# Patient Record
Sex: Female | Born: 1960
Health system: Southern US, Community
[De-identification: ages and names within clinical notes are randomized; demographics above are authoritative.]

## PROBLEM LIST (undated history)

## (undated) DIAGNOSIS — J302 Other seasonal allergic rhinitis: Secondary | ICD-10-CM

## (undated) DIAGNOSIS — I8393 Asymptomatic varicose veins of bilateral lower extremities: Secondary | ICD-10-CM

## (undated) DIAGNOSIS — K769 Liver disease, unspecified: Secondary | ICD-10-CM

## (undated) DIAGNOSIS — G5600 Carpal tunnel syndrome, unspecified upper limb: Secondary | ICD-10-CM

## (undated) DIAGNOSIS — E119 Type 2 diabetes mellitus without complications: Secondary | ICD-10-CM

## (undated) DIAGNOSIS — R569 Unspecified convulsions: Secondary | ICD-10-CM

## (undated) DIAGNOSIS — E876 Hypokalemia: Secondary | ICD-10-CM

## (undated) DIAGNOSIS — J449 Chronic obstructive pulmonary disease, unspecified: Secondary | ICD-10-CM

## (undated) DIAGNOSIS — R300 Dysuria: Secondary | ICD-10-CM

## (undated) DIAGNOSIS — G5603 Carpal tunnel syndrome, bilateral upper limbs: Secondary | ICD-10-CM

## (undated) DIAGNOSIS — I2510 Atherosclerotic heart disease of native coronary artery without angina pectoris: Secondary | ICD-10-CM

## (undated) DIAGNOSIS — M069 Rheumatoid arthritis, unspecified: Secondary | ICD-10-CM

## (undated) DIAGNOSIS — C3412 Malignant neoplasm of upper lobe, left bronchus or lung: Secondary | ICD-10-CM

## (undated) DIAGNOSIS — B1920 Unspecified viral hepatitis C without hepatic coma: Secondary | ICD-10-CM

## (undated) DIAGNOSIS — I10 Essential (primary) hypertension: Secondary | ICD-10-CM

## (undated) DIAGNOSIS — M1990 Unspecified osteoarthritis, unspecified site: Secondary | ICD-10-CM

## (undated) DIAGNOSIS — E782 Mixed hyperlipidemia: Secondary | ICD-10-CM

## (undated) DIAGNOSIS — F32A Depression, unspecified: Secondary | ICD-10-CM

## (undated) HISTORY — DX: Chronic obstructive pulmonary disease, unspecified: J44.9

## (undated) HISTORY — DX: Atherosclerotic heart disease of native coronary artery without angina pectoris: I25.10

## (undated) HISTORY — DX: Depression, unspecified: F32.A

## (undated) HISTORY — DX: Unspecified viral hepatitis C without hepatic coma: B19.20

## (undated) HISTORY — DX: Liver disease, unspecified: K76.9

## (undated) HISTORY — DX: Mixed hyperlipidemia: E78.2

## (undated) HISTORY — DX: Rheumatoid arthritis, unspecified: M06.9

## (undated) HISTORY — PX: TONSILLECTOMY: SUR1361

## (undated) HISTORY — DX: Carpal tunnel syndrome, unspecified upper limb: G56.00

## (undated) HISTORY — DX: Type 2 diabetes mellitus without complications: E11.9

## (undated) HISTORY — DX: Dysuria: R30.0

## (undated) HISTORY — DX: Essential (primary) hypertension: I10

## (undated) HISTORY — DX: Unspecified osteoarthritis, unspecified site: M19.90

## (undated) HISTORY — DX: Carpal tunnel syndrome, bilateral upper limbs: G56.03

## (undated) HISTORY — DX: Unspecified convulsions: R56.9

## (undated) HISTORY — DX: Asymptomatic varicose veins of bilateral lower extremities: I83.93

## (undated) HISTORY — DX: Hypokalemia: E87.6

## (undated) HISTORY — PX: BACK SURGERY: SHX140

## (undated) HISTORY — PX: TUBAL LIGATION: SHX77

## (undated) HISTORY — DX: Other seasonal allergic rhinitis: J30.2

---

## 2003-01-07 ENCOUNTER — Ambulatory Visit (HOSPITAL_COMMUNITY): Admission: RE | Admit: 2003-01-07 | Discharge: 2003-01-07 | Payer: Self-pay

## 2004-11-08 ENCOUNTER — Ambulatory Visit: Payer: Self-pay | Admitting: Cardiology

## 2010-09-24 ENCOUNTER — Encounter: Payer: Self-pay | Admitting: Family Medicine

## 2010-12-20 DIAGNOSIS — I10 Essential (primary) hypertension: Secondary | ICD-10-CM | POA: Insufficient documentation

## 2010-12-20 DIAGNOSIS — E876 Hypokalemia: Secondary | ICD-10-CM | POA: Insufficient documentation

## 2011-06-11 DIAGNOSIS — M1990 Unspecified osteoarthritis, unspecified site: Secondary | ICD-10-CM | POA: Insufficient documentation

## 2012-06-16 ENCOUNTER — Other Ambulatory Visit: Payer: Self-pay | Admitting: Foot & Ankle Surgery

## 2012-06-16 DIAGNOSIS — R0989 Other specified symptoms and signs involving the circulatory and respiratory systems: Secondary | ICD-10-CM

## 2012-06-18 ENCOUNTER — Other Ambulatory Visit: Payer: Self-pay

## 2012-12-10 DIAGNOSIS — R079 Chest pain, unspecified: Secondary | ICD-10-CM

## 2013-09-22 DIAGNOSIS — R609 Edema, unspecified: Secondary | ICD-10-CM | POA: Insufficient documentation

## 2014-02-23 DIAGNOSIS — F17200 Nicotine dependence, unspecified, uncomplicated: Secondary | ICD-10-CM | POA: Insufficient documentation

## 2015-05-17 ENCOUNTER — Encounter: Payer: Self-pay | Admitting: Neurology

## 2015-05-17 ENCOUNTER — Ambulatory Visit (INDEPENDENT_AMBULATORY_CARE_PROVIDER_SITE_OTHER): Payer: Commercial Managed Care - HMO | Admitting: Neurology

## 2015-05-17 VITALS — BP 115/60 | HR 78 | Ht 66.0 in | Wt 188.0 lb

## 2015-05-17 DIAGNOSIS — R55 Syncope and collapse: Secondary | ICD-10-CM | POA: Diagnosis not present

## 2015-05-17 DIAGNOSIS — R569 Unspecified convulsions: Secondary | ICD-10-CM | POA: Insufficient documentation

## 2015-05-17 NOTE — Progress Notes (Signed)
[facility] [address] [place]. Christina Jenkins [id number] [phone]       OFFICE CONSULT NOTE  [name] Date of Birth:  [birth year] Medical Record Number:  [id number]   Referring MD:  Bennett Scrape, MD Reason for Referral:  seizure  HPI: [name] is a 54 year old pleasant Caucasian lady who had an witness episode on 05/05/15 of brief loss of consciousness. Patient works as a Surveyor, mining and hence stopped and lasting she remembers is waving O2 lady who is a mother of one of the kids she drives. The patient does not remember what happened for the next few minutes but the next thing she remembers is a lady who is holding her and patient had collapsed into her seat. She did not have any headache, tongue bite, incontinence or injuries. The eye witness lady  apparently described some clonic movements to the EMS. Patient was taken to the emergency room at [facility] where basic lab work and CT scan of the head which I have reviewed was unremarkable. Patient quickly returned back to her baseline though she did feel tired for a while. She was not started on any seizure medications. Patient states she's done well since then has not had any recurrent episodes of passing out. She denies specifically any triggers, chest pain, shortness of breath, palpitations, sweating prior to this episode. She has had no prior episodes of syncope or presyncopal events or seizures. There is no history of significant head injury with loss of consciousness, strokes or seizures. There is no family history of epilepsy either. Patient was on Wellbutrin for the last 1 year prior and was trying to quit smoking. She has stopped Wellbutrin since this episode.  ROS:   14 system review of systems is positive for leg swelling, excessive thirst, seizure, loss of consciousness and all other systems negative  PMH:  Past Medical History  Diagnosis Date  . Liver disease     hepatitis c  . Hypokalemia     . Dysuria   . Carpal tunnel syndrome on both sides   . Arthritis   . Seasonal allergies     Social History:  Social History   Social History  . Marital Status: Married    Spouse Name: N/A  . Number of Children: N/A  . Years of Education: N/A   Occupational History  . Not on file.   Social History Main Topics  . Smoking status: Current Every Day Smoker -- 1.00 packs/day  . Smokeless tobacco: Not on file  . Alcohol Use: No  . Drug Use: Not on file  . Sexual Activity: Not on file   Other Topics Concern  . Not on file   Social History Narrative  . No narrative on file    Medications:   No current outpatient prescriptions on file prior to visit.   No current facility-administered medications on file prior to visit.    Allergies:  No Known Allergies  Physical Exam General: well developed, well nourished pleasant middle-age Caucasian lady who is anxious., seated, in no evident distress Head: head normocephalic and atraumatic.   Neck: supple with no carotid or supraclavicular bruits Cardiovascular: regular rate and rhythm, no murmurs Musculoskeletal: no deformity Skin:  no rash/petichiae Vascular:  Normal pulses all extremities  Neurologic Exam Mental Status: Awake and fully alert. Oriented to place and time. Recent and remote memory intact. Attention span, concentration and fund of knowledge appropriate. Mood and affect appropriate.  Cranial Nerves: Fundoscopic exam  reveals sharp disc margins. Pupils equal, briskly reactive to light. Extraocular movements full without nystagmus. Visual fields full to confrontation. Hearing intact. Facial sensation intact. Face, tongue, palate moves normally and symmetrically.  Motor: Normal bulk and tone. Normal strength in all tested extremity muscles. Sensory.: intact to touch , pinprick , position and vibratory sensation.  Coordination: Rapid alternating movements normal in all extremities. Finger-to-nose and heel-to-shin performed  accurately bilaterally. Gait and Station: Arises from chair without difficulty. Stance is normal. Gait demonstrates normal stride length and balance . Able to heel, toe and tandem walk without difficulty.  Reflexes: 1+ and symmetric. Toes downgoing.       ASSESSMENT: 44 year Caucasian lady with single episode of brief loss of consciousness with weakness chronic activity possible convulsive syncopal syncopal event versus single unprovoked seizure. Non-focal neurological exam and brain imaging    PLAN: I had a long discussion with the patient with regards to her episode of brief loss of consciousness and jerking activity being possible convulsive syncope versus single unprovoked seizure. I personally reviewed her available imaging studies lab work and here visit notes I recommend further testing by checking echocardiogram, 72 hour Holter monitor, EEG and MRI scan of the brain. I explained to her that the risk of having subsequent unprovoked seizures is only 50% lifetime and hence routine anticonvulsants  are not indicated unless she has an abnormal EEG or MRI scan. 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 Greater than 50% of time during this 45 minute consultation was spent on counseling and coordination of care. She will return for follow-up in 2 months or call earlier if necessary.  Delia Heady, MD Note: This document was prepared with digital dictation and possible smart phrase technology. Any transcriptional errors that result from this process are unintentional.

## 2015-05-17 NOTE — Patient Instructions (Signed)
I had a long discussion with the patient with regards to her episode of brief loss of consciousness and jerking activity being possible convulsive syncope versus single unprovoked seizure. I personally reviewed her available imaging studies lab work and here visit notes I recommend further testing by checking echocardiogram, 72 hour Holter monitor, EEG and MRI scan of the brain. 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 She will return for follow-up in 2 months or call earlier if necessary. Syncope Syncope is a medical term for fainting or passing out. This means you lose consciousness and drop to the ground. People are generally unconscious for less than 5 minutes. You may have some muscle twitches for up to 15 seconds before waking up and returning to normal. Syncope occurs more often in older adults, but it can happen to anyone. While most causes of syncope are not dangerous, syncope can be a sign of a serious medical problem. It is important to seek medical care.  CAUSES  Syncope is caused by a sudden drop in blood flow to the brain. The specific cause is often not determined. Factors that can bring on syncope include:  Taking medicines that lower blood pressure.  Sudden changes in posture, such as standing up quickly.  Taking more medicine than prescribed.  Standing in one place for too long.  Seizure disorders.  Dehydration and excessive exposure to heat.  Low blood sugar (hypoglycemia).  Straining to have a bowel movement.  Heart disease, irregular heartbeat, or other circulatory problems.  Fear, emotional distress, seeing blood, or severe pain. SYMPTOMS  Right before fainting, you may:  Feel dizzy or light-headed.  Feel nauseous.  See all white or all black in your field of vision.  Have cold, clammy skin. DIAGNOSIS  Your health care provider will ask about your symptoms, perform a physical exam, and perform  an electrocardiogram (ECG) to record the electrical activity of your heart. Your health care provider may also perform other heart or blood tests to determine the cause of your syncope which may include:  Transthoracic echocardiogram (TTE). During echocardiography, sound waves are used to evaluate how blood flows through your heart.  Transesophageal echocardiogram (TEE).  Cardiac monitoring. This allows your health care provider to monitor your heart rate and rhythm in real time.  Holter monitor. This is a portable device that records your heartbeat and can help diagnose heart arrhythmias. It allows your health care provider to track your heart activity for several days, if needed.  Stress tests by exercise or by giving medicine that makes the heart beat faster. TREATMENT  In most cases, no treatment is needed. Depending on the cause of your syncope, your health care provider may recommend changing or stopping some of your medicines. HOME CARE INSTRUCTIONS  Have someone stay with you until you feel stable.  Do not drive, use machinery, or play sports until your health care provider says it is okay.  Keep all follow-up appointments as directed by your health care provider.  Lie down right away if you start feeling like you might faint. Breathe deeply and steadily. Wait until all the symptoms have passed.  Drink enough fluids to keep your urine clear or pale yellow.  If you are taking blood pressure or heart medicine, get up slowly and take several minutes to sit and then stand. This can reduce dizziness. SEEK IMMEDIATE MEDICAL CARE IF:   You have a severe  headache.  You have unusual pain in the chest, abdomen, or back.  You are bleeding from your mouth or rectum, or you have black or tarry stool.  You have an irregular or very fast heartbeat.  You have pain with breathing.  You have repeated fainting or seizure-like jerking during an episode.  You faint when sitting or lying  down.  You have confusion.  You have trouble walking.  You have severe weakness.  You have vision problems. If you fainted, call your local emergency services (911 in [place] Do not drive yourself to the hospital.  MAKE SURE YOU:  Understand these instructions.  Will watch your condition.  Will get help right away if you are not doing well or get worse. Document Released: 08/20/2005 Document Revised: 08/25/2013 Document Reviewed: 10/19/2011 [facility] Patient Information 2015 [place], [place]. This information is not intended to replace advice given to you by your health care provider. Make sure you discuss any questions you have with your health care provider.

## 2015-05-21 DIAGNOSIS — R55 Syncope and collapse: Secondary | ICD-10-CM | POA: Diagnosis not present

## 2015-05-23 ENCOUNTER — Ambulatory Visit (INDEPENDENT_AMBULATORY_CARE_PROVIDER_SITE_OTHER): Payer: Commercial Managed Care - HMO

## 2015-05-23 DIAGNOSIS — R55 Syncope and collapse: Secondary | ICD-10-CM

## 2015-05-25 ENCOUNTER — Other Ambulatory Visit: Payer: Self-pay

## 2015-05-25 ENCOUNTER — Telehealth: Payer: Self-pay | Admitting: Neurology

## 2015-05-25 ENCOUNTER — Ambulatory Visit (INDEPENDENT_AMBULATORY_CARE_PROVIDER_SITE_OTHER): Payer: Commercial Managed Care - HMO

## 2015-05-25 DIAGNOSIS — R55 Syncope and collapse: Secondary | ICD-10-CM | POA: Diagnosis not present

## 2015-05-25 NOTE — Telephone Encounter (Signed)
Rn call patient back about MRI results. Pt stated it was done at the hospital in [facility]. Rn stated a message will be sent to Dr.Sethi. Rn also told patient that  [facility] is not part of [facility]. Rn explain to patient that she will have to go the hospital where the MRI was and filled out a release form, and have them mail the cd and fax or send a copy of the mri report. Pt stated she will someone take her to get this done, because she is not allowed to drive.

## 2015-05-25 NOTE — Telephone Encounter (Signed)
Patient called to get results of recent MRI

## 2015-06-06 ENCOUNTER — Ambulatory Visit (INDEPENDENT_AMBULATORY_CARE_PROVIDER_SITE_OTHER): Payer: Commercial Managed Care - HMO | Admitting: Neurology

## 2015-06-06 DIAGNOSIS — R55 Syncope and collapse: Secondary | ICD-10-CM

## 2015-06-06 DIAGNOSIS — R569 Unspecified convulsions: Secondary | ICD-10-CM

## 2015-06-09 ENCOUNTER — Telehealth: Payer: Self-pay | Admitting: Neurology

## 2015-06-09 ENCOUNTER — Other Ambulatory Visit: Payer: Self-pay | Admitting: Neurology

## 2015-06-09 DIAGNOSIS — R569 Unspecified convulsions: Secondary | ICD-10-CM

## 2015-06-09 NOTE — Telephone Encounter (Signed)
Sent note to Dr. Terrace Arabia.

## 2015-06-09 NOTE — Telephone Encounter (Signed)
i called the patient back after Dr. Terrace Arabia and Dr. Marjory Lies  had both looked at the EEG which had shown some intermittent temporal rhytmic activity which was of questionable significance. I recommend a repeat prolonged 1 hour EEG to see if there is any epileptiform potential. 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 She was to call back if she had further episodes of loss of consciousness or seizure-like activity.

## 2015-06-09 NOTE — Telephone Encounter (Signed)
Rn talk to patient about her EEG results not being posted. Pt stated Dr. Pearlean Brownie did call her this afternoon and told her that the EEG was normal, but they got disconnected. Rn stated he call her back but she was not available. Rn stated a message will be forward to Dr. Roda Shutters.

## 2015-06-09 NOTE — Telephone Encounter (Signed)
Pt called requesting EEG results. Please call and advise. Patient can be reached at [phone]

## 2015-06-10 NOTE — Procedures (Signed)
   [facility]  EEG (ELECTROENCEPHALOGRAM) REPORT   STUDY DATE: 06/06/15 PATIENT NAME: Christina Jenkins DOB: 1961-04-01 MRN: [id number]  ORDERING CLINICIAN: Delia Heady, MD   TECHNOLOGIST: Gearldine Shown  TECHNIQUE: Electroencephalogram was recorded utilizing standard 10-20 system of lead placement and reformatted into average and bipolar montages.  RECORDING TIME: 24 minutes ACTIVATION: hyperventilation and photic stimulation  CLINICAL INFORMATION: 54 year old female with episode of transient altered consciousness. Evaluate for seizure activity.  FINDINGS: Background rhythms of 9-10 hertz and 50-60 microvolts. Intermittent sharp waves are noted over the left hemisphere (Fp2, F8, T4, 02), sometimes associated with slow wave depolarization. No electrographic seizures are seen. Patient recorded in the awake state. EKG channel shows irregular rhythm of 60-70 beats per minute.  IMPRESSION:  Abnormal EEG in the awake state demonstrating: 1. Intermittent left hemisphere sharp waves over FP2, F8, T4, O2 electrodes. Sometimes associated with slow-wave depolarizations. Findings are suspicious for epileptiform discharges, which may increase potential for seizure disorder.  2. Otherwise normal background rhythms.  3. No electrographic seizures are seen.    INTERPRETING PHYSICIAN:  Suanne Marker, MD Certified in Neurology, Neurophysiology and Neuroimaging  [facility] [address], [address] [place], [place] [address] [phone]

## 2015-06-29 ENCOUNTER — Telehealth: Payer: Self-pay

## 2015-06-29 ENCOUNTER — Encounter: Payer: Self-pay | Admitting: Neurology

## 2015-06-29 NOTE — Telephone Encounter (Signed)
Refer her to [facility] or hospital which ever can do it faster

## 2015-06-29 NOTE — Telephone Encounter (Signed)
Ms. Plumb has a prolonged EEG scheduled for Tuesday and  F/u on 11/22...we have to cancel her EEG due to [place] being out of the office and might not get her r/s to the end of Nov early Dec... What would you like [place] to do?  Refer out or re-schedule both apts...please let us know

## 2015-06-30 NOTE — Telephone Encounter (Signed)
Rn call patient about her [facility]. Patient stated she already has an appt for next week. Pt was given phone number to contact office for directions.

## 2015-07-05 ENCOUNTER — Other Ambulatory Visit: Payer: Commercial Managed Care - HMO

## 2015-07-06 ENCOUNTER — Ambulatory Visit (INDEPENDENT_AMBULATORY_CARE_PROVIDER_SITE_OTHER): Payer: Commercial Managed Care - HMO | Admitting: Neurology

## 2015-07-06 DIAGNOSIS — R569 Unspecified convulsions: Secondary | ICD-10-CM

## 2015-07-11 ENCOUNTER — Other Ambulatory Visit: Payer: Self-pay | Admitting: Neurology

## 2015-07-11 MED ORDER — LEVETIRACETAM ER 500 MG PO TB24: 500.0000 mg | ORAL_TABLET | Freq: Every day | ORAL | Status: DC

## 2015-07-11 NOTE — Telephone Encounter (Signed)
Patient called to inquire about results of EEG study. Please call [phone].

## 2015-07-11 NOTE — Procedures (Signed)
ELECTROENCEPHALOGRAM REPORT  Date of Study: 07/06/2015  Patient's Name: Christina Jenkins MRN: [id number] Date of Birth: 10/01/1960  Referring Provider: Dr. Delia Heady  Clinical History:  This is a 53 year old woman with an episode of loss of consciousness.   Medications: None listed  Technical Summary: A multichannel digital 1-hour EEG recording measured by the international 10-20 system with electrodes applied with paste and impedances below 5000 ohms performed in our laboratory with EKG monitoring in an awake and asleep patient.  Hyperventilation and photic stimulation were performed.  The digital EEG was referentially recorded, reformatted, and digitally filtered in a variety of bipolar and referential montages for optimal display.    Description: The patient is awake and asleep during the recording.  During maximal wakefulness, there is a symmetric, medium voltage 9-10 Hz posterior dominant rhythm that attenuates with eye opening.  There is occasional focal 4-5 Hz theta slowing seen independently over the bilateral temporal regions, at times sharply contoured without clear epileptogenic potential.  During drowsiness and sleep, there is an increase in theta slowing of the background.  Vertex waves and symmetric sleep spindles were seen.  Hyperventilation and photic stimulation did not elicit any abnormalities.  There were no epileptiform discharges or electrographic seizures seen.    EKG lead was unremarkable.  Impression: This 1-hour awake and asleep EEG is abnormal due to occasional focal slowing seen over the bilateral temporal regions.  Clinical Correlation of the above findings indicates focal cerebral dysfunction over the bilateral temporal regions suggestive of underlying structural or physiologic abnormality. The absence of epileptiform discharges does not exclude a clinical diagnosis of epilepsy. Clinical correlation is advised.   Patrcia Dolly, M.D.

## 2015-07-11 NOTE — Telephone Encounter (Signed)
Rn call patient back about her EEG study. Rn stated the results are not up yet. Pt had sleep deprived EEG on 07-06-15. Rn stated a call will be made to [facility] for the results if they are ready.

## 2015-07-11 NOTE — Telephone Encounter (Signed)
I called the patient and gave report of prolonged EEG showing bitemporal Theta slowing indicative of focal abnormality but no definite seizure activity. The patient however remains at high risk for recurrent seizures and hence I would recommend a trial of anti-convulsions at the present time. I discussed possible side effects of Keppra and recommend starting Keppra XR 500 mg daily. She was advised not to drive and keep her appointment with me on November 22

## 2015-07-11 NOTE — Telephone Encounter (Signed)
Rn call patient back to state the sleep deprived EEG readings and results were read now. Rn stated Dr.Sethi will give her a call of the final reading and recommendation. Number is [phone].

## 2015-07-12 NOTE — Telephone Encounter (Signed)
:  LFt vm that keppra was sent to [facility] as of yesterday. Rn explain she needs to start taking immediately.

## 2015-07-26 ENCOUNTER — Ambulatory Visit (INDEPENDENT_AMBULATORY_CARE_PROVIDER_SITE_OTHER): Payer: Commercial Managed Care - HMO | Admitting: Neurology

## 2015-07-26 ENCOUNTER — Encounter: Payer: Self-pay | Admitting: Neurology

## 2015-07-26 VITALS — BP 119/65 | HR 79 | Ht 66.0 in | Wt 190.4 lb

## 2015-07-26 DIAGNOSIS — G40209 Localization-related (focal) (partial) symptomatic epilepsy and epileptic syndromes with complex partial seizures, not intractable, without status epilepticus: Secondary | ICD-10-CM

## 2015-07-26 NOTE — Progress Notes (Signed)
[facility] [address] [place]. [place] [address] [phone]       OFFICE FOLLOW UP VISIT NOTE  Christina Jenkins Date of Birth:  11-25-1960 Medical Record Number:  [id number]   Referring MD:  Bennett Scrape, MD Reason for Referral:  seizure  HPI: Christina Jenkins is a 54 year old pleasant Caucasian Christina Jenkins who had an witness episode on 05/05/15 of brief loss of consciousness. 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 The patient does not remember what happened for the next few minutes but the next thing she remembers is a Christina Jenkins who is holding her and patient had collapsed into her seat. She did not have any headache, tongue bite, incontinence or injuries. The eye witness Christina Jenkins  apparently described some clonic movements to the EMS. Patient was taken to the emergency room at [facility] where basic lab work and CT scan of the head which I have reviewed was unremarkable. Patient quickly returned back to her baseline though she did feel tired for a while. She was not started on any seizure medications. Patient states she's done well since then has not had any recurrent episodes of passing out. She denies specifically any triggers, chest pain, shortness of breath, palpitations, sweating prior to this episode. She has had no prior episodes of syncope or presyncopal events or seizures. There is no history of significant head injury with loss of consciousness, strokes or seizures. There is no family history of epilepsy either. Patient was on Wellbutrin for the last 1 year prior and was trying to quit smoking. She has stopped Wellbutrin since this episode. Update 07/26/2015 : She returns for follow-up after last visit 2 months ago. She's had no further episodes of loss of consciousness or seizure-like activity. She informs me that she had been on Wellbutrin 2 months prior to that episode and  has since stopped it. She had an EEG done on 06/06/15 in our office which suggested intermittent rhythmic left temporal slowing concerning for seizures hence I ordered her prolonged 1 hour EEG study which was done on 07/06/15 which showed intermittent bitemporal slowing but no definite epileptiform activity. Patient was started on Keppra XR 500 mg daily following the second abnormal EEG and she seems to be tolerating it well. She takes it at night as it makes her sleepy. 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 ROS:   14 system review of systems is positive for  memory loss, leg swelling, seizure, loss of consciousness and all other systems negative  PMH:  Past Medical History  Diagnosis Date  . Liver disease     hepatitis c  . Hypokalemia   . Dysuria   . Carpal tunnel syndrome on both sides   . Arthritis   . Seasonal allergies     Social History:  Social History   Social History  . Marital Status: Married    Spouse Name: N/A  . Number of Children: N/A  . Years of Education: N/A   Occupational History  . Not on file.   Social History Main Topics  . Smoking status: Current Every Day Smoker -- 1.00 packs/day  . Smokeless tobacco: Not on file  . Alcohol Use: No  . Drug Use:  Not on file  . Sexual Activity: Not on file   Other Topics Concern  . Not on file   Social History Narrative    Medications:   Current Outpatient Prescriptions on File Prior to Visit  Medication Sig Dispense Refill  . acetaminophen (TYLENOL) 325 MG tablet Take 650 mg by mouth.    . furosemide (LASIX) 20 MG tablet take 1 tablet by mouth every morning if needed for SWELLING  0  . levETIRAcetam (KEPPRA XR) 500 MG 24 hr tablet Take 1 tablet (500 mg total) by mouth daily. 30 tablet 3  . traMADol (ULTRAM) 50 MG tablet   0   No current facility-administered medications on file prior to  visit.    Allergies:  No Known Allergies  Physical Exam General: well developed, well nourished pleasant middle-age Caucasian Christina Jenkins who is anxious., seated, in no evident distress Head: head normocephalic and atraumatic.   Neck: supple with no carotid or supraclavicular bruits Cardiovascular: regular rate and rhythm, no murmurs Musculoskeletal: no deformity Skin:  no rash/petichiae Vascular:  Normal pulses all extremities  Neurologic Exam Mental Status: Awake and fully alert. Oriented to place and time. Recent and remote memory intact. Attention span, concentration and fund of knowledge appropriate. Mood and affect appropriate.  Cranial Nerves: Fundoscopic exam reveals sharp disc margins. Pupils equal, briskly reactive to light. Extraocular movements full without nystagmus. Visual fields full to confrontation. Hearing intact. Facial sensation intact. Face, tongue, palate moves normally and symmetrically.  Motor: Normal bulk and tone. Normal strength in all tested extremity muscles. Sensory.: intact to touch , pinprick , position and vibratory sensation.  Coordination: Rapid alternating movements normal in all extremities. Finger-to-nose and heel-to-shin performed accurately bilaterally. Gait and Station: Arises from chair without difficulty. Stance is normal. Gait demonstrates normal stride length and balance . Able to heel, toe and tandem walk without difficulty.  Reflexes: 1+ and symmetric. Toes downgoing.       ASSESSMENT: 89 year Caucasian Christina Jenkins with single episode of brief loss of consciousness    possible  Complex partial   seizure. Given 2 abnormal EEGs suggesting focal temporal sharp waves and slowing.     PLAN:  I had a long discussion with the patient with regards to her single episode of altered consciousness likely represent a complex partial seizure particularly in the view of 2 abnormal EEGs suggesting focal temporal lobe irritability and slowing of brain wave activity  which puts her at high risk for recurrent seizures. Continue Keppra 500 mg XR once daily which she seems to be tolerating well without side effects. I discussed with her seizure provoking stimuli and advise her to avoid them. She was asked not to drive till next follow-up visit with me in 3 months.   Delia Heady, MD Note: This document was prepared with digital dictation and possible smart phrase technology. Any transcriptional errors that result from this process are unintentional.

## 2015-07-26 NOTE — Patient Instructions (Signed)
I had a long discussion with the patient with regards to her single episode of altered consciousness likely represent a complex partial seizure particularly in the view of 2 abnormal EEGs suggesting focal temporal lobe irritability and slowing of brain wave activity which puts her at high risk for recurrent seizures. Continue Keppra 500 mg XR once daily which she seems to be tolerating well without side effects. I discussed with her seizure provoking stimuli and advise her to avoid them. She was asked not to drive till next follow-up visit with me in 3 months. Epilepsy Epilepsy is a disorder in which a person has repeated seizures over time. A seizure is a release of abnormal electrical activity in the brain. Seizures can cause a change in attention, behavior, or the ability to remain awake and alert (altered mental status). Seizures often involve uncontrollable shaking (convulsions).  Most people with epilepsy lead normal lives. However, people with epilepsy are at an increased risk of falls, accidents, and injuries. Therefore, it is important to begin treatment right away. CAUSES  Epilepsy has many possible causes. Anything that disturbs the normal pattern of brain cell activity can lead to seizures. This may include:   Head injury.  Birth trauma.  High fever as a child.  Stroke.  Bleeding into or around the brain.  Certain drugs.  Prolonged low oxygen, such as what occurs after CPR efforts.  Abnormal brain development.  Certain illnesses, such as meningitis, encephalitis (brain infection), malaria, and other infections.  An imbalance of nerve signaling chemicals (neurotransmitters).  SIGNS AND SYMPTOMS  The symptoms of a seizure can vary greatly from one person to another. Right before a seizure, you may have a warning (aura) that a seizure is about to occur. An aura may include the following symptoms:  Fear or anxiety.  Nausea.  Feeling like the room is spinning  (vertigo).  Vision changes, such as seeing flashing lights or spots. Common symptoms during a seizure include:  Abnormal sensations, such as an abnormal smell or a bitter taste in the mouth.   Sudden, general body stiffness.   Convulsions that involve rhythmic jerking of the face, arm, or leg on one or both sides.   Sudden change in consciousness.   Appearing to be awake but not responding.   Appearing to be asleep but cannot be awakened.   Grimacing, chewing, lip smacking, drooling, tongue biting, or loss of bowel or bladder control. After a seizure, you may feel sleepy for a while. DIAGNOSIS  Your health care provider will ask about your symptoms and take a medical history. Descriptions from any witnesses to your seizures will be very helpful in the diagnosis. A physical exam, including a detailed neurological exam, is necessary. Various tests may be done, such as:   An electroencephalogram (EEG). This is a painless test of your brain waves. In this test, a diagram is created of your brain waves. These diagrams can be interpreted by a specialist.  An MRI of the brain.   A CT scan of the brain.   A spinal tap (lumbar puncture, LP).  Blood tests to check for signs of infection or abnormal blood chemistry. TREATMENT  There is no cure for epilepsy, but it is generally treatable. Once epilepsy is diagnosed, it is important to begin treatment as soon as possible. For most people with epilepsy, seizures can be controlled with medicines. The following may also be used:  A pacemaker for the brain (vagus nerve stimulator) can be used for people with  seizures that are not well controlled by medicine.  Surgery on the brain. For some people, epilepsy eventually goes away. HOME CARE INSTRUCTIONS   Follow your health care provider's recommendations on driving and safety in normal activities.  Get enough rest. Lack of sleep can cause seizures.  Only take over-the-counter or  prescription medicines as directed by your health care provider. Take any prescribed medicine exactly as directed.  Avoid any known triggers of your seizures.  Keep a seizure diary. Record what you recall about any seizure, especially any possible trigger.   Make sure the people you live and work with know that you are prone to seizures. They should receive instructions on how to help you. In general, a witness to a seizure should:   Cushion your head and body.   Turn you on your side.   Avoid unnecessarily restraining you.   Not place anything inside your mouth.   Call for emergency medical help if there is any question about what has occurred.   Follow up with your health care provider as directed. You may need regular blood tests to monitor the levels of your medicine.  SEEK MEDICAL CARE IF:   You develop signs of infection or other illness. This might increase the risk of a seizure.   You seem to be having more frequent seizures.   Your seizure pattern is changing.  SEEK IMMEDIATE MEDICAL CARE IF:   You have a seizure that does not stop after a few moments.   You have a seizure that causes any difficulty in breathing.   You have a seizure that results in a very severe headache.   You have a seizure that leaves you with the inability to speak or use a part of your body.    This information is not intended to replace advice given to you by your health care provider. Make sure you discuss any questions you have with your health care provider.   Document Released: 08/20/2005 Document Revised: 06/10/2013 Document Reviewed: 04/01/2013 Elsevier Interactive Patient Education [employer]

## 2015-09-28 ENCOUNTER — Telehealth: Payer: Self-pay | Admitting: Neurology

## 2015-09-28 NOTE — Telephone Encounter (Signed)
Pt called said she has form from [facility] needs to be filled out. She will call to pay $25 fee and pt will mail or fax form to [facility]. FYI only

## 2015-09-28 NOTE — Telephone Encounter (Signed)
Rn call patient about her needing DMV form. RN stated a release form needs to be filled out too. Pt will have somebody bring her over to [facility] to pay and do the release form.

## 2015-09-29 NOTE — Telephone Encounter (Signed)
Patient called back to advise, she will not be able to come today to sign release form due to transportation issue, will come tomorrow. This is an FYI, no need to call back.

## 2015-09-30 ENCOUNTER — Telehealth: Payer: Self-pay | Admitting: *Deleted

## 2015-09-30 NOTE — Telephone Encounter (Signed)
Form,DMV received from Arman Bogus sent to Katrina and Dr Pearlean Brownie 09/30/15.

## 2015-10-03 DIAGNOSIS — Z0289 Encounter for other administrative examinations: Secondary | ICD-10-CM

## 2015-10-20 ENCOUNTER — Encounter: Payer: Self-pay | Admitting: Neurology

## 2015-10-20 ENCOUNTER — Ambulatory Visit (INDEPENDENT_AMBULATORY_CARE_PROVIDER_SITE_OTHER): Payer: Self-pay | Admitting: Neurology

## 2015-10-20 ENCOUNTER — Ambulatory Visit (INDEPENDENT_AMBULATORY_CARE_PROVIDER_SITE_OTHER): Payer: Commercial Managed Care - HMO | Admitting: Neurology

## 2015-10-20 DIAGNOSIS — G5601 Carpal tunnel syndrome, right upper limb: Secondary | ICD-10-CM | POA: Diagnosis not present

## 2015-10-20 DIAGNOSIS — G5600 Carpal tunnel syndrome, unspecified upper limb: Secondary | ICD-10-CM

## 2015-10-20 HISTORY — DX: Carpal tunnel syndrome, unspecified upper limb: G56.00

## 2015-10-20 NOTE — Progress Notes (Signed)
Please refer to the EMG and NCV procedure note. 

## 2015-10-20 NOTE — Procedures (Signed)
     HISTORY:  Christina Jenkins is a 55 year old patient with a history of shoulder and hand discomfort that began in November 2015. The patient has developed discomfort in both hands with some associated numbness. She denies any significant neck discomfort. She is being evaluated for a possible neuropathy or for carpal tunnel syndrome.  NERVE CONDUCTION STUDIES:  Nerve conduction studies were performed on both upper extremities. The distal motor latencies for the median nerves were prolonged on the right, normal on the left, with normal motor amplitudes for these nerves bilaterally. The distal motor latencies and motor amplitudes for the ulnar nerves were normal bilaterally. The F wave latencies and nerve conduction velocities for the median and ulnar nerves were normal bilaterally. The sensory latencies for the median nerves were prolonged on the right, normal on the left, and normal for the ulnar nerves bilaterally.  EMG STUDIES:  EMG study was performed on the right upper extremity:  The first dorsal interosseous muscle reveals 2 to 4 K units with full recruitment. No fibrillations or positive waves were noted. The abductor pollicis brevis muscle reveals 2 to 4 K units with slightly decreased recruitment. No fibrillations or positive waves were noted. The extensor indicis proprius muscle reveals 1 to 3 K units with full recruitment. No fibrillations or positive waves were noted. The pronator teres muscle reveals 2 to 3 K units with full recruitment. No fibrillations or positive waves were noted. The biceps muscle reveals 1 to 2 K units with full recruitment. No fibrillations or positive waves were noted. The triceps muscle reveals 2 to 4 K units with full recruitment. No fibrillations or positive waves were noted. The anterior deltoid muscle reveals 2 to 3 K units with full recruitment. No fibrillations or positive waves were noted. The cervical paraspinal muscles were tested at 2 levels. No  abnormalities of insertional activity were seen at either level tested. There was good relaxation.  EMG study was performed on the left upper extremity:  The first dorsal interosseous muscle reveals 2 to 4 K units with full recruitment. No fibrillations or positive waves were noted. The abductor pollicis brevis muscle reveals 2 to 4 K units with full recruitment. No fibrillations or positive waves were noted. The extensor indicis proprius muscle reveals 1 to 3 K units with full recruitment. No fibrillations or positive waves were noted. The pronator teres muscle reveals 2 to 3 K units with full recruitment. No fibrillations or positive waves were noted. The biceps muscle reveals 1 to 2 K units with full recruitment. No fibrillations or positive waves were noted. The triceps muscle reveals 2 to 4 K units with full recruitment. No fibrillations or positive waves were noted. The anterior deltoid muscle reveals 2 to 3 K units with full recruitment. No fibrillations or positive waves were noted. The cervical paraspinal muscles were tested at 2 levels. No abnormalities of insertional activity were seen at either level tested. There was good relaxation.   IMPRESSION:  Nerve conduction studies done on both upper extremities revealed evidence of a mild right carpal tunnel syndrome. No other significant abnormalities were seen. EMG evaluations of both upper extremities were relatively unremarkable, without evidence of an overlying cervical radiculopathy on either side.  Marlan Palau MD 10/20/2015 12:45 PM  [facility] [address] [place], [place] [address]  Phone [phone] Fax [phone]

## 2015-11-02 ENCOUNTER — Ambulatory Visit (INDEPENDENT_AMBULATORY_CARE_PROVIDER_SITE_OTHER): Payer: Commercial Managed Care - HMO | Admitting: Neurology

## 2015-11-02 ENCOUNTER — Encounter: Payer: Self-pay | Admitting: Neurology

## 2015-11-02 VITALS — BP 112/72 | HR 82 | Ht 66.0 in | Wt 202.8 lb

## 2015-11-02 DIAGNOSIS — G3184 Mild cognitive impairment, so stated: Secondary | ICD-10-CM

## 2015-11-02 DIAGNOSIS — M2550 Pain in unspecified joint: Secondary | ICD-10-CM

## 2015-11-02 MED ORDER — LEVETIRACETAM ER 500 MG PO TB24: 1000.0000 mg | ORAL_TABLET | Freq: Every day | ORAL | Status: DC

## 2015-11-02 NOTE — Telephone Encounter (Signed)
DmV completed given to patient at today's office visit.

## 2015-11-02 NOTE — Patient Instructions (Signed)
I had a long discussion with the patient and her son regarding her recent episodes of staring , memory loss and unresponsiveness been concerning for possible Compex partial seizures hence I recommend increasing Keppra XR dose to 1000 mg daily and check repeat EEG, also check memory panel labs and repeat CT scan of the head to look for treatable conditions. Referral to dermatologist Dr. Corliss Skains for evaluation for arthralgias and joint pain. She was advised not to drive. She'll return for follow-up in 3 months with nurse practitioner or call earlier if necessary

## 2015-11-02 NOTE — Progress Notes (Signed)
[facility] [address] [place]. [place] [address] [phone]       OFFICE FOLLOW UP VISIT NOTE  [name] Date of Birth:  Apr 15, 1961 Medical Record Number:  [id number]   Referring MD:  Bennett Scrape, MD Reason for Referral:  seizure  HPI: [name] is a 55 year old pleasant Caucasian lady who had an witness episode on 05/05/15 of brief loss of consciousness. Patient works as a Surveyor, mining and hence stopped and lasting she remembers is waving O2 lady who is a mother of one of the kids she drives. The patient does not remember what happened for the next few minutes but the next thing she remembers is a lady who is holding her and patient had collapsed into her seat. She did not have any headache, tongue bite, incontinence or injuries. The eye witness lady  apparently described some clonic movements to the EMS. Patient was taken to the emergency room at [facility] where basic lab work and CT scan of the head which I have reviewed was unremarkable. Patient quickly returned back to her baseline though she did feel tired for a while. She was not started on any seizure medications. Patient states she's done well since then has not had any recurrent episodes of passing out. She denies specifically any triggers, chest pain, shortness of breath, palpitations, sweating prior to this episode. She has had no prior episodes of syncope or presyncopal events or seizures. There is no history of significant head injury with loss of consciousness, strokes or seizures. There is no family history of epilepsy either. Patient was on Wellbutrin for the last 1 year prior and was trying to quit smoking. She has stopped Wellbutrin since this episode. Update 07/26/2015 : She returns for follow-up after last visit 2 months ago. She's had no further episodes of loss of consciousness or seizure-like activity. She informs me that she had been on Wellbutrin 2 months prior to that episode and  has since stopped it. She had an EEG done on 06/06/15 in our office which suggested intermittent rhythmic left temporal slowing concerning for seizures hence I ordered her prolonged 1 hour EEG study which was done on 07/06/15 which showed intermittent bitemporal slowing but no definite epileptiform activity. Patient was started on Keppra XR 500 mg daily following the second abnormal EEG and she seems to be tolerating it well. She takes it at night as it makes her sleepy. 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 Update 11/02/2015 : She returns for follow-up after last visit 3 months ago. She is accompanied by her son who provides most of the history. Patient has been complaining of one-year history of intermittent myalgias and arthralgias which seem to have gotten worse over the last 3 months. She now reports some swelling of her hand joints as well as some tender spots. She was seen by orthopedic surgeon who ordered no conduction EMG which was done on 10/20/15 by Dr. Anne Hahn and showed only mild right-sided carpal tunnel. Patient is also concerned of her poor short-term memory as well as forgetfulness. Son noted that at least once a day As well as spacing out and briefly not being responsive. This does not last long. He has not witnessed any lipsmacking, drooling, tongue bite or incontinence. Patient states she has been compliant with taking her Keppra XR  500 mg daily. She has not been driving. She has not been able to return back to work ROS:   14 system review of systems is positive for  memory loss, leg swelling, numbness, joint pain, joint swelling, depression, confusion and all other systems negative  PMH:  Past Medical History  Diagnosis Date  . Liver disease     hepatitis c  . Hypokalemia   . Dysuria   . Carpal tunnel syndrome on both sides   . Arthritis   . Seasonal allergies    . Carpal tunnel syndrome 10/20/2015    right    Social History:  Social History   Social History  . Marital Status: Married    Spouse Name: N/A  . Number of Children: N/A  . Years of Education: N/A   Occupational History  . Not on file.   Social History Main Topics  . Smoking status: Current Every Day Smoker -- 1.00 packs/day  . Smokeless tobacco: Not on file  . Alcohol Use: No  . Drug Use: Not on file  . Sexual Activity: Not on file   Other Topics Concern  . Not on file   Social History Narrative    Medications:   Current Outpatient Prescriptions on File Prior to Visit  Medication Sig Dispense Refill  . acetaminophen (TYLENOL) 325 MG tablet Take 650 mg by mouth.    . furosemide (LASIX) 20 MG tablet take 1 tablet by mouth every morning if needed for SWELLING  0  . traMADol (ULTRAM) 50 MG tablet   0   No current facility-administered medications on file prior to visit.    Allergies:  No Known Allergies  Physical Exam General: well developed, well nourished pleasant middle-age Caucasian lady who is anxious., seated, in no evident distress Head: head normocephalic and atraumatic.   Neck: supple with no carotid or supraclavicular bruits Cardiovascular: regular rate and rhythm, no murmurs Musculoskeletal: no deformity Skin:  no rash/petichiae Vascular:  Normal pulses all extremities  Neurologic Exam Mental Status: Awake and fully alert. Oriented to place and time. Recent and remote memory intact. Attention span, concentration and fund of knowledge appropriate. Mood and affect appropriate. Recall 3/3. Animal naming test 7. Clock drawing 4/4. Geriatric depression scale 4 not suggestive of depression. Cranial Nerves: Fundoscopic exam reveals sharp disc margins. Pupils equal, briskly reactive to light. Extraocular movements full without nystagmus. Visual fields full to confrontation. Hearing intact. Facial sensation intact. Face, tongue, palate moves normally and  symmetrically.  Motor: Normal bulk and tone. Normal strength in all tested extremity muscles. Sensory.: intact to touch , pinprick , position and vibratory sensation.  Coordination: Rapid alternating movements normal in all extremities. Finger-to-nose and heel-to-shin performed accurately bilaterally. Gait and Station: Arises from chair without difficulty. Stance is normal. Gait demonstrates normal stride length and balance . Able to heel, toe and tandem walk without difficulty.  Reflexes: 1+ and symmetric. Toes downgoing.       ASSESSMENT: 61 year Caucasian lady with single episode of brief loss of consciousness    possible  Complex partial   Seizure. Recent short-term memory and cognitive difficulties of unclear etiology possibly unwitnessed seizures. New complaints of arthralgias and myalgias of unknown etiology  PLAN:  I had a long discussion with the patient and her son regarding her recent episodes of staring , memory loss and unresponsiveness been concerning for possible Compex partial seizures hence I recommend increasing Keppra XR dose to 1000 mg daily and check repeat EEG, also check memory  panel labs and repeat CT scan of the head to look for treatable conditions. Referral to dermatologist Dr. Corliss Skains for evaluation for arthralgias and joint pain. She was advised not to drive. She'll return for follow-up in 3 months with nurse practitioner or call earlier if necessary   Delia Heady, MD Note: This document was prepared with digital dictation and possible smart phrase technology. Any transcriptional errors that result from this process are unintentional.

## 2015-11-03 LAB — DEMENTIA PANEL
Homocysteine: 9.1 umol/L (ref 0.0–15.0)
RPR Ser Ql: NONREACTIVE
TSH: 3.93 u[IU]/mL (ref 0.450–4.500)
VITAMIN B 12: 531 pg/mL (ref 211–946)

## 2015-11-07 ENCOUNTER — Ambulatory Visit (HOSPITAL_COMMUNITY)
Admission: RE | Admit: 2015-11-07 | Discharge: 2015-11-07 | Disposition: A | Discharge status: Home / Self Care | Payer: Commercial Managed Care - HMO | Source: Ambulatory Visit | Attending: Neurology | Admitting: Neurology

## 2015-11-07 DIAGNOSIS — G3184 Mild cognitive impairment, so stated: Secondary | ICD-10-CM | POA: Insufficient documentation

## 2015-11-07 DIAGNOSIS — M2550 Pain in unspecified joint: Secondary | ICD-10-CM | POA: Insufficient documentation

## 2015-11-07 MED ORDER — IOHEXOL 300 MG/ML  SOLN
75.0000 mL | Freq: Once | INTRAMUSCULAR | Status: AC | PRN
  Administered 2015-11-07: 75 mL via INTRAVENOUS

## 2015-11-25 ENCOUNTER — Ambulatory Visit (INDEPENDENT_AMBULATORY_CARE_PROVIDER_SITE_OTHER): Payer: Commercial Managed Care - HMO | Admitting: Neurology

## 2015-11-25 DIAGNOSIS — R55 Syncope and collapse: Secondary | ICD-10-CM | POA: Diagnosis not present

## 2015-11-25 DIAGNOSIS — G3184 Mild cognitive impairment, so stated: Secondary | ICD-10-CM

## 2015-11-25 NOTE — Procedures (Signed)
     History: Christina Jenkins is a 55 year old patient with an episode of loss of consciousness on 05/05/2015. The patient is a school bus driver. She has been placed on Keppra, but she now reports some issues with cognitive slowing and memory loss. She is being evaluated for this issue.  This is a routine EEG. No skull defects are noted. Medications include Lasix, hydrocodone, Keppra, and Ultram.  EEG classification: Delta grade 1 bihemispheric  Description of the recording: The background rhythms of this recording consists of a moderately well modulated medium amplitude alpha rhythm of 8 Hz that is reactive to eye opening and closure. As the record progresses, photic stimulation is performed, this results in a bilateral and symmetric photic drive response. Hyperventilation is also performed, this results in a minimal buildup of the background rhythm activities without significant slowing seen. Intermittently during the recording, episodes of theta slowing are seen emanating from the left and the right temporal areas independently. There is also a 2 or 3 Hz delta slowing that occurs intermittently and is bilaterally symmetric in the posterior regions. This is seen intermittently about the recording. At no time during the study does there appear to be evidence of spike or spike-wave discharges. EKG monitor shows no evidence of cardiac rhythm abnormalities with a heart rate of 66.  Depression: This is an abnormal EEG recording secondary to episodes of temporal and bilateral posterior slowing. The study is nonspecific, can be seen with a toxic or metabolic encephalopathy, or could potentially represent an interictal pattern. No epileptiform discharges are seen.

## 2015-12-27 ENCOUNTER — Telehealth: Payer: Self-pay | Admitting: Neurology

## 2015-12-27 NOTE — Telephone Encounter (Signed)
Pt called said [facility] [employer] [phone] sent request for records and told pt they have not received anything.  Pt was given MR fax# for future reference.

## 2015-12-28 ENCOUNTER — Telehealth: Payer: Self-pay | Admitting: *Deleted

## 2015-12-28 NOTE — Telephone Encounter (Signed)
I faxed  records,  to disability determinations on 12/28/15.

## 2015-12-29 NOTE — Telephone Encounter (Signed)
Medical records were faxed on 12/28/15 to [facility] disability.

## 2016-01-25 DIAGNOSIS — Z0271 Encounter for disability determination: Secondary | ICD-10-CM

## 2016-02-01 DIAGNOSIS — M1990 Unspecified osteoarthritis, unspecified site: Secondary | ICD-10-CM

## 2016-02-01 HISTORY — DX: Unspecified osteoarthritis, unspecified site: M19.90

## 2016-02-02 ENCOUNTER — Encounter: Payer: Self-pay | Admitting: Nurse Practitioner

## 2016-02-02 ENCOUNTER — Ambulatory Visit (INDEPENDENT_AMBULATORY_CARE_PROVIDER_SITE_OTHER): Payer: Commercial Managed Care - HMO | Admitting: Nurse Practitioner

## 2016-02-02 VITALS — BP 139/85 | HR 76 | Ht 66.0 in | Wt 201.0 lb

## 2016-02-02 DIAGNOSIS — G40209 Localization-related (focal) (partial) symptomatic epilepsy and epileptic syndromes with complex partial seizures, not intractable, without status epilepticus: Secondary | ICD-10-CM

## 2016-02-02 DIAGNOSIS — G3184 Mild cognitive impairment, so stated: Secondary | ICD-10-CM | POA: Diagnosis not present

## 2016-02-02 DIAGNOSIS — Z5181 Encounter for therapeutic drug level monitoring: Secondary | ICD-10-CM

## 2016-02-02 DIAGNOSIS — R569 Unspecified convulsions: Secondary | ICD-10-CM

## 2016-02-02 MED ORDER — LEVETIRACETAM ER 500 MG PO TB24: 1000.0000 mg | ORAL_TABLET | Freq: Every day | ORAL | Status: DC

## 2016-02-02 NOTE — Progress Notes (Signed)
[facility]  PATIENT: Christina Jenkins DOB: April 08, 1961   REASON FOR VISIT: Follow-up for mild cognitive impairment complex partial seizures HISTORY FROM:patient and son    HISTORY OF PRESENT ILLNESS:Christina Jenkins is a 55 year old pleasant Caucasian lady who had an witness episode on 05/05/15 of brief loss of consciousness. Patient works as a Surveyor, mining and hence stopped and lasting she remembers is waving O2 lady who is a mother of one of the kids she drives. The patient does not remember what happened for the next few minutes but the next thing she remembers is a lady who is holding her and patient had collapsed into her seat. She did not have any headache, tongue bite, incontinence or injuries. The eye witness lady apparently described some clonic movements to the EMS. Patient was taken to the emergency room at [facility], [place] where basic lab work and CT scan of the head which I have reviewed was unremarkable. Patient quickly returned back to her baseline though she did feel tired for a while. She was not started on any seizure medications. Patient states she's done well since then has not had any recurrent episodes of passing out. She denies specifically any triggers, chest pain, shortness of breath, palpitations, sweating prior to this episode. She has had no prior episodes of syncope or presyncopal events or seizures. There is no history of significant head injury with loss of consciousness, strokes or seizures. There is no family history of epilepsy either. Patient was on Wellbutrin for the last 1 year prior and was trying to quit smoking. She has stopped Wellbutrin since this episode. Update 07/26/2015 :PS She returns for follow-up after last visit 2 months ago. She's had no further episodes of loss of consciousness or seizure-like activity. She informs me that she had been on Wellbutrin 2 months prior to that episode and has since stopped it. She had an EEG done on  06/06/15 in our office which suggested intermittent rhythmic left temporal slowing concerning for seizures hence I ordered her prolonged 1 hour EEG study which was done on 07/06/15 which showed intermittent bitemporal slowing but no definite epileptiform activity. Patient was started on Keppra XR 500 mg daily following the second abnormal EEG and she seems to be tolerating it well. She takes it at night as it makes her sleepy. 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 Update 3/1/2017PS : She returns for follow-up after last visit 3 months ago. She is accompanied by her son who provides most of the history. Patient has been complaining of one-year history of intermittent myalgias and arthralgias which seem to have gotten worse over the last 3 months. She now reports some swelling of her hand joints as well as some tender spots. She was seen by orthopedic surgeon who ordered no conduction EMG which was done on 10/20/15 by Dr. Anne Jenkins and showed only mild right-sided carpal tunnel. Patient is also concerned of her poor short-term memory as well as forgetfulness. Son noted that at least once a day As well as spacing out and briefly not being responsive. This does not last long. He has not witnessed any lipsmacking, drooling, tongue bite or incontinence. Patient states she has been compliant with taking her Keppra XR 500 mg daily. She has not been driving. She has not been able to return back to work  UPDATE 06/01/2017CM Ms. 24, 55 year old female returns for follow-up. She was last seen in the office by Dr. Pearlean Brownie 11/02/15. At that time because she had episodes of spacing out and not responding, her Keppra dose was increased to 1000 mg daily. In addition CT and memory labs were ordered along with EEG. CT of the head was normal memory labs were normal. EEG on 11/25/15 showing mild bilateral slowing  of brain wave activity which is nonspecific but no definite seizure activity noted. She has been diagnosed with rheumatoid arthritis. Her last generalized seizure activity was in September. She denies any partial seizures since her visit in March with her Keppra dose increase. She is currently not driving. She returns for reevaluation  REVIEW OF SYSTEMS: Full 14 system review of systems performed and notable only for those listed, all others are neg:  Constitutional: neg  Cardiovascular: neg Ear/Nose/Throat: neg  Skin: neg Eyes: neg Respiratory: neg Gastroitestinal: neg  Hematology/Lymphatic: neg  Endocrine: neg Musculoskeletal Joint pain joint swelling  Allergy/Immunology: neg Neurological: neg Psychiatric: Depression and anxiety Sleep : neg   ALLERGIES: No Known Allergies  HOME MEDICATIONS: Outpatient Prescriptions Prior to Visit  Medication Sig Dispense Refill  . acetaminophen (TYLENOL) 325 MG tablet Take 650 mg by mouth.    . furosemide (LASIX) 20 MG tablet take 1 tablet by mouth every morning if needed for SWELLING  0  . HYDROcodone-acetaminophen (NORCO/VICODIN) 5-325 MG tablet take 1 tablet by mouth every 6 hours for 10 days  0  . levETIRAcetam (KEPPRA XR) 500 MG 24 hr tablet Take 2 tablets (1,000 mg total) by mouth daily. 60 tablet 3  . traMADol (ULTRAM) 50 MG tablet   0   No facility-administered medications prior to visit.    PAST MEDICAL HISTORY: Past Medical History  Diagnosis Date  . Liver disease     hepatitis c  . Hypokalemia   . Dysuria   . Carpal tunnel syndrome on both sides   . Arthritis 02/01/16    dx w/rheumatoid arthritis  . Seasonal allergies   . Carpal tunnel syndrome 10/20/2015    right  . Seizures (HCC)     last 05/05/15    PAST SURGICAL HISTORY: No past surgical history on file.  FAMILY HISTORY: Family History  Problem Relation Age of Onset  . Pancreatic cancer Mother   . COPD Father     SOCIAL HISTORY: Social History   Social  History  . Marital Status: Married    Spouse Name: N/A  . Number of Children: N/A  . Years of Education: N/A   Occupational History  . Not on file.   Social History Main Topics  . Smoking status: Current Every Day Smoker -- 1.00 packs/day  . Smokeless tobacco: Not on file  . Alcohol Use: No  . Drug Use: No  . Sexual Activity: Not on file   Other Topics Concern  . Not on file   Social History Narrative     PHYSICAL EXAM  Filed Vitals:   02/02/16 1100  BP: 139/85  Pulse: 76  Height: 5\' 6"  (1.676 m)  Weight: 201 lb (91.173 kg)   Body mass index is 32.46 kg/(m^2). General: well developed, well nourished pleasant middle-age Caucasian lady who is anxious., seated, in no evident distress Head: head normocephalic and atraumatic.  Neck: supple with no carotid bruits Cardiovascular: regular rate and rhythm, no murmurs Musculoskeletal: no deformity Skin: no rash/petichiae  Neurologic Exam Mental Status: Awake and fully alert. Oriented to place and time.  Recent and remote memory intact. Attention span, concentration and fund of knowledge appropriate. Mood and affect appropriate. MMSE 29/30. Recall 3/3. Animal naming test 14. Clock drawing 4/4.  Cranial Nerves: Fundoscopic exam reveals sharp disc margins. Pupils equal, briskly reactive to light. Extraocular movements full without nystagmus. Visual fields full to confrontation. Hearing intact. Facial sensation intact. Face, tongue, palate moves normally and symmetrically.  Motor: Normal bulk and tone. Normal strength in all tested extremity muscles. Sensory.: intact to touch , pinprick , position and vibratory sensation.  Coordination: Rapid alternating movements normal in all extremities. Finger-to-nose and heel-to-shin performed accurately bilaterally. Gait and Station: Arises from chair without difficulty. Stance is normal. Gait demonstrates normal stride length and balance . Able to heel, toe and tandem walk without  difficulty.  Reflexes: 1+ and symmetric. Toes downgoing.   DIAGNOSTIC DATA (LABS, IMAGING, TESTING) - Lab Results  Component Value Date   VITAMINB12 531 11/02/2015   Lab Results  Component Value Date   TSH 3.930 11/02/2015      ASSESSMENT AND PLAN 71 year Caucasian lady with single episode of brief loss of consciousness possible Complex partial Seizure. Recent short-term memory and cognitive difficulties of unclear etiology possibly unwitnessed seizures. New complaints of arthralgias and myalgias of unknown etiologyCT of the head was normal memory labs were normal. EEG on 11/25/15 showing mild bilateral slowing of brain wave activity which is nonspecific but no definite seizure activity noted. Recently diagnosed with rheumatoid arthritis.The patient is a current patient of Dr. Pearlean Brownie who is out of the office today . This note is sent to the work in doctor.     Continue Keppra at current dose 1000 mg ER daily Will check Keppra level No driving at present Follow-up in 4 months Made patient and son aware that her  episodes of staring , memory loss and unresponsiveness been concerning for possible Compex partial seizures.Vst time 25 min Nilda Riggs, [facility], [facility] APRN  [facility] [address], [address] [place], [place] [address] [phone]

## 2016-02-02 NOTE — Patient Instructions (Signed)
Continue Keppra at current dose 1000 mg ER daily Will check Keppra level No driving at present Follow-up in 4 months

## 2016-02-02 NOTE — Progress Notes (Signed)
I reviewed above note and agree with the assessment and plan.  Marvel Plan, MD PhD Stroke Neurology 02/02/2016 10:54 PM

## 2016-02-05 LAB — LEVETIRACETAM LEVEL: LEVETIRACETAM: 12.4 ug/mL (ref 10.0–40.0)

## 2016-02-07 ENCOUNTER — Telehealth: Payer: Self-pay | Admitting: Nurse Practitioner

## 2016-02-07 NOTE — Telephone Encounter (Signed)
Patient returned call

## 2016-02-13 ENCOUNTER — Telehealth: Payer: Self-pay | Admitting: Nurse Practitioner

## 2016-02-13 NOTE — Progress Notes (Signed)
Quick Note:  Called and spoke to patient relayed Keppra level was ok. Patient understood. ______

## 2016-02-13 NOTE — Telephone Encounter (Signed)
Called and spoke to patient relayed Keppra level was ok. Patient understood.

## 2016-02-13 NOTE — Telephone Encounter (Signed)
-----   Message from Nilda Riggs, NP sent at 02/06/2016  7:59 AM EDT ----- Keppra level ok Please call

## 2016-05-03 ENCOUNTER — Telehealth: Payer: Self-pay | Admitting: Neurology

## 2016-05-03 NOTE — Telephone Encounter (Signed)
Yes she can drive but limit to daytime hours if possible

## 2016-05-03 NOTE — Telephone Encounter (Signed)
Patient called, states tomorrow will be 1 yr since seizure, asks if she will be able to drive tomorrow. Please call to advise.

## 2016-05-03 NOTE — Telephone Encounter (Signed)
Lft vm for patient about her driving privileges.

## 2016-05-04 NOTE — Telephone Encounter (Signed)
Rn call patient back about driving privileges. Rn stated per Dr.Sethi she can drive. Rn explain Dr.Sethi wants her to limit her driving to day time is possible. Pt verbalized understanding.

## 2016-06-04 ENCOUNTER — Ambulatory Visit (INDEPENDENT_AMBULATORY_CARE_PROVIDER_SITE_OTHER): Payer: Commercial Managed Care - HMO | Admitting: Nurse Practitioner

## 2016-06-04 ENCOUNTER — Encounter: Payer: Self-pay | Admitting: Nurse Practitioner

## 2016-06-04 VITALS — BP 138/84 | HR 76 | Ht 66.0 in | Wt 207.0 lb

## 2016-06-04 DIAGNOSIS — G3184 Mild cognitive impairment, so stated: Secondary | ICD-10-CM | POA: Diagnosis not present

## 2016-06-04 DIAGNOSIS — G40209 Localization-related (focal) (partial) symptomatic epilepsy and epileptic syndromes with complex partial seizures, not intractable, without status epilepticus: Secondary | ICD-10-CM

## 2016-06-04 MED ORDER — LEVETIRACETAM ER 500 MG PO TB24: 1000.0000 mg | ORAL_TABLET | Freq: Every day | ORAL | 6 refills | Status: DC

## 2016-06-04 NOTE — Patient Instructions (Addendum)
Continue Keppra at current dose 1000 mg ER daily will refill May drive automobile Call for any seizure activity Follow-up in 6 months

## 2016-06-04 NOTE — Progress Notes (Signed)
[facility]  PATIENT: Christina Jenkins DOB: Aug 15, 1961   REASON FOR VISIT: Follow-up for mild cognitive impairment complex partial seizures HISTORY FROM:patient     HISTORY OF PRESENT ILLNESS:Christina Jenkins is a 55 year old pleasant Caucasian lady who had an witness episode on 05/05/15 of brief loss of consciousness. Patient works as a Surveyor, mining and hence stopped and lasting she remembers is waving O2 lady who is a mother of one of the kids she drives. The patient does not remember what happened for the next few minutes but the next thing she remembers is a lady who is holding her and patient had collapsed into her seat. She did not have any headache, tongue bite, incontinence or injuries. The eye witness lady apparently described some clonic movements to the EMS. Patient was taken to the emergency room at [facility] where basic lab work and CT scan of the head which I have reviewed was unremarkable. Patient quickly returned back to her baseline though she did feel tired for a while. She was not started on any seizure medications. Patient states she's done well since then has not had any recurrent episodes of passing out. She denies specifically any triggers, chest pain, shortness of breath, palpitations, sweating prior to this episode. She has had no prior episodes of syncope or presyncopal events or seizures. There is no history of significant head injury with loss of consciousness, strokes or seizures. There is no family history of epilepsy either. Patient was on Wellbutrin for the last 1 year prior and was trying to quit smoking. She has stopped Wellbutrin since this episode. Update 07/26/2015 :PS She returns for follow-up after last visit 2 months ago. She's had no further episodes of loss of consciousness or seizure-like activity. She informs me that she had been on Wellbutrin 2 months prior to that episode and has since stopped it. She had an EEG done on 06/06/15 in  our office which suggested intermittent rhythmic left temporal slowing concerning for seizures hence I ordered her prolonged 1 hour EEG study which was done on 07/06/15 which showed intermittent bitemporal slowing but no definite epileptiform activity. Patient was started on Keppra XR 500 mg daily following the second abnormal EEG and she seems to be tolerating it well. She takes it at night as it makes her sleepy. 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 Update 3/1/2017PS : She returns for follow-up after last visit 3 months ago. She is accompanied by her son who provides most of the history. Patient has been complaining of one-year history of intermittent myalgias and arthralgias which seem to have gotten worse over the last 3 months. She now reports some swelling of her hand joints as well as some tender spots. She was seen by orthopedic surgeon who ordered no conduction EMG which was done on 10/20/15 by Dr. Anne Jenkins and showed only mild right-sided carpal tunnel. Patient is also concerned of her poor short-term memory as well as forgetfulness. Son noted that at least once a day As well as spacing out and briefly not being responsive. This does not last long. He has not witnessed any lipsmacking, drooling, tongue bite or incontinence. Patient states she has been compliant with taking her Keppra XR 500 mg daily. She has not been driving. She has not been able to return back to work  UPDATE  06/01/2017CM Christina. 66, 56 year old female returns for follow-up. She was last seen in the office by Dr. Pearlean Brownie 11/02/15. At that time because she had episodes of spacing out and not responding, her Keppra dose was increased to 1000 mg daily. In addition CT and memory labs were ordered along with EEG. CT of the head was normal memory labs were normal. EEG on 11/25/15 showing mild bilateral slowing of brain  wave activity which is nonspecific but no definite seizure activity noted. She has been diagnosed with rheumatoid arthritis. Her last generalized seizure activity was in September. She denies any partial seizures since her visit in March with her Keppra dose increase. She is currently not driving. She returns for reevaluation   UPDATE 10/2/17CM Christina Jenkins, 55 year old female returns for follow-up she has a history of seizure disorder and mild cognitive impairment. She is currently on Keppra XR 500 mg tablets 2 daily without further seizure activity since last seen. CT scan of the head was normal memory labs return normal. EEG 11/25/2015 shows mild bilateral slowing of brain wave activity which is nonspecific but no definite seizure activity noted. She is being followed for rheumatoid arthritis by Dr. Jeneen Jenkins.  She is currently driving without difficulty. She returns for reevaluation. She states she has applied  for disability REVIEW OF SYSTEMS: Full 14 system review of systems performed and notable only for those listed, all others are neg:  Constitutional: neg  Cardiovascular: neg Ear/Nose/Throat: Hearing loss Skin: neg Eyes: neg Respiratory: neg Gastroitestinal: neg  Hematology/Lymphatic: neg  Endocrine: Excessive thirst Musculoskeletal Joint pain joint swelling , neck pain Allergy/Immunology: neg Neurological: Memory loss Psychiatric: Depression and anxiety Sleep : neg   ALLERGIES: No Known Allergies  HOME MEDICATIONS: Outpatient Medications Prior to Visit  Medication Sig Dispense Refill  . acetaminophen (TYLENOL) 325 MG tablet Take 650 mg by mouth.    . DULoxetine (CYMBALTA) 60 MG capsule Take 60 mg by mouth daily.  0  . folic acid (FOLVITE) 1 MG tablet 1 mg daily.    . furosemide (LASIX) 20 MG tablet take 1 tablet by mouth every morning if needed for SWELLING  0  . levETIRAcetam (KEPPRA XR) 500 MG 24 hr tablet Take 2 tablets (1,000 mg total) by mouth daily. 60 tablet 4  .  mometasone (ELOCON) 0.1 % ointment apply to affected area once daily ---TO LOWER LEGS UNDER ACE WRAPS  0  . traMADol (ULTRAM) 50 MG tablet   0  . predniSONE (DELTASONE) 5 MG tablet 5 mg daily.    Marland Kitchen HYDROcodone-acetaminophen (NORCO/VICODIN) 5-325 MG tablet take 1 tablet by mouth every 6 hours for 10 days  0   No facility-administered medications prior to visit.     PAST MEDICAL HISTORY: Past Medical History:  Diagnosis Date  . Arthritis 02/01/16   dx w/rheumatoid arthritis  . Carpal tunnel syndrome 10/20/2015   right  . Carpal tunnel syndrome on both sides   . Dysuria   . Hypokalemia   . Liver disease    hepatitis c  . Seasonal allergies   . Seizures (HCC)    last 05/05/15    PAST SURGICAL HISTORY: No past surgical history on file.  FAMILY HISTORY: Family History  Problem Relation Age of Onset  . Pancreatic cancer Mother   . COPD Father     SOCIAL HISTORY: Social History   Social History  . Marital status: Married    Spouse name: N/A  . Number of children: N/A  . Years of education: N/A  Occupational History  . Not on file.   Social History Main Topics  . Smoking status: Current Every Day Smoker    Packs/day: 1.00  . Smokeless tobacco: Never Used  . Alcohol use No  . Drug use: No  . Sexual activity: Not on file   Other Topics Concern  . Not on file   Social History Narrative  . No narrative on file     PHYSICAL EXAM  Vitals:   06/04/16 0954  BP: 138/84  Pulse: 76  Weight: 207 lb (93.9 kg)  Height: 5\' 6"  (1.676 m)   Body mass index is 33.41 kg/m. General: well developed, well nourished pleasant middle-age Caucasian lady who is anxious., seated, in no evident distress Head: head normocephalic and atraumatic.  Neck: supple with no carotid bruits Cardiovascular: regular rate and rhythm, no murmurs Musculoskeletal: no deformity Skin: no rash/petichiae  Neurologic Exam Mental Status: Awake and fully alert. Oriented to place and time. Recent  and remote memory intact. Attention span, concentration and fund of knowledge appropriate. Mood and affect appropriate. MMSE 29/30. Recall 2/3. Animal naming test 8 Clock drawing 4/4.  Cranial Nerves:  Pupils equal, briskly reactive to light. Extraocular movements full without nystagmus. Visual fields full to confrontation. Hearing intact. Facial sensation intact. Face, tongue, palate moves normally and symmetrically.  Motor: Normal bulk and tone. Normal strength in all tested extremity muscles. Sensory.: intact to touch ,.  Coordination: Rapid alternating movements normal in all extremities. Finger-to-nose and heel-to-shin performed accurately bilaterally. Gait and Station: Arises from chair without difficulty. Stance is normal. Gait demonstrates normal stride length and balance . Able to heel, toe and tandem walk without difficulty.  Reflexes: 1+ and symmetric. Toes downgoing.   DIAGNOSTIC DATA (LABS, IMAGING, TESTING) - Lab Results  Component Value Date   VITAMINB12 531 11/02/2015   Lab Results  Component Value Date   TSH 3.930 11/02/2015      ASSESSMENT AND PLAN 19 year Caucasian lady with single episode of brief loss of consciousness possible Complex partial Seizure.Recent short-term memory and cognitive difficulties of unclear etiology possibly unwitnessed seizures. CT of the head was normal memory labs were normal. EEG on 11/25/15 showing mild bilateral slowing of brain wave activity which is nonspecific but no definite seizure activity noted. Recently diagnosed with rheumatoid arthritis.The patient is a current patient of Dr. 11/27/15 who is out of the office today . This note is sent to the work in doctor.     Continue Keppra at current dose 1000 mg ER daily willl refill May drive automobile Call for any seizure activity Follow-up in 6 months Pearlean Brownie, [facility], [facility], APRN  [facility] [address] [place], [place] [phone]

## 2016-06-04 NOTE — Progress Notes (Signed)
I have reviewed and agreed above plan. 

## 2016-06-13 ENCOUNTER — Ambulatory Visit (INDEPENDENT_AMBULATORY_CARE_PROVIDER_SITE_OTHER): Payer: Commercial Managed Care - HMO | Admitting: Rheumatology

## 2016-06-13 DIAGNOSIS — F17200 Nicotine dependence, unspecified, uncomplicated: Secondary | ICD-10-CM

## 2016-06-13 DIAGNOSIS — Z79899 Other long term (current) drug therapy: Secondary | ICD-10-CM | POA: Diagnosis not present

## 2016-06-13 DIAGNOSIS — M0579 Rheumatoid arthritis with rheumatoid factor of multiple sites without organ or systems involvement: Secondary | ICD-10-CM | POA: Diagnosis not present

## 2016-06-13 DIAGNOSIS — M79641 Pain in right hand: Secondary | ICD-10-CM

## 2016-06-13 DIAGNOSIS — M25571 Pain in right ankle and joints of right foot: Secondary | ICD-10-CM | POA: Diagnosis not present

## 2016-06-26 ENCOUNTER — Telehealth: Payer: Self-pay | Admitting: Rheumatology

## 2016-06-26 NOTE — Telephone Encounter (Signed)
Patient has questions about a medication she is trying to start. Patient states she has had on ongoing conversation with Amy about this.

## 2016-06-27 NOTE — Telephone Encounter (Signed)
I spoke to patient, her copay is $30, with the copay card I sent her it should be free. She will need to review consents and return

## 2016-06-29 ENCOUNTER — Telehealth: Payer: Self-pay | Admitting: Pharmacist

## 2016-06-29 NOTE — Telephone Encounter (Signed)
Patient was previously counseled on Enbrel and had consented to Enbrel use.  After applying for Enbrel through patient's insurance, we identified that Cimzia was the preferred TNF inhibitor.  Patient was mailed the patient handout and consent form for Cimzia.  I called patient today to follow up.    Patient confirms she received the Cimzia information in the mail.  Counseled patient that Cimzia is a TNF blocking agent that is very similar to Enbrel.  Counseled patient on purpose, proper use, and adverse effects of Cimzia.  Reviewed the most common adverse effects including risk of infection, headache, injection site reaction.  Patient has been advised to have yearly dermatology exams due to the risk of malignancy.  Reviewed the importance of regular laboratory monitoring while on Cimzia.  Counseled patient that Cimzia comes in 200 mg pens, and reviewed dose of 400 mg (2 pens) at week 0, 2, and 4, then every 4 weeks.  Patient voiced understanding.  Patient provided verbal consent for Cimzia.  Advised patient to bring her written consent to the nursing visit when she has her first Cimzia injection.    Advised patient that Cimzia prescription was sent to [facility] on 06/21/16.  Advised patient to ensure her copay card is activated, then call [facility] to schedule the delivery of her Cimzia.  Advised patient to call our office to schedule her nursing visit once she has set a delivery date for her Cimzia.  Patient voiced understanding.   Lilla Shook, Pharm.D., BCPS Clinical Pharmacist Pager: [phone] Phone: [phone] 06/29/2016 1:37 PM

## 2016-07-05 ENCOUNTER — Telehealth: Payer: Self-pay | Admitting: Radiology

## 2016-07-05 ENCOUNTER — Ambulatory Visit (INDEPENDENT_AMBULATORY_CARE_PROVIDER_SITE_OTHER): Payer: Commercial Managed Care - HMO | Admitting: Radiology

## 2016-07-05 ENCOUNTER — Encounter: Payer: Self-pay | Admitting: Radiology

## 2016-07-05 VITALS — BP 130/80 | HR 80 | Resp 16

## 2016-07-05 DIAGNOSIS — M0579 Rheumatoid arthritis with rheumatoid factor of multiple sites without organ or systems involvement: Secondary | ICD-10-CM

## 2016-07-05 MED ORDER — PREDNISONE 5 MG PO TABS: 10.0000 mg | ORAL_TABLET | Freq: Every day | ORAL | 0 refills | Status: DC

## 2016-07-05 MED ORDER — CERTOLIZUMAB PEGOL 2 X 200 MG/ML ~~LOC~~ KIT
400.0000 mg | PACK | Freq: Once | SUBCUTANEOUS | Status: AC
  Administered 2016-07-05: 400 mg via SUBCUTANEOUS

## 2016-07-05 NOTE — Progress Notes (Signed)
Patient has presented today for new start Cimzia. I injected her left lower abdomen with Cimzia, she self injected the right lower abdomen   Patient has requested renewal on Prednisone, I have asked Dr Corliss Skains to discuss with her. Prednisone taper was sent in for her on 06/22/16 20mg  x4d, 15x4d....  Patient tolerated injections well. Will monitor for 30 minutes then discharge.

## 2016-07-05 NOTE — Telephone Encounter (Signed)
Discussed prednisone with patient/ and Dr Corliss Skains Per Dr Corliss Skains patient will remain on 10mg  prednisone until her Cimzia starts to help, she was new start today / I will call patient to advis e

## 2016-07-05 NOTE — Patient Instructions (Addendum)
Labs in one month  Call if you have questions.      Certolizumab pegol injection What is this medicine? CERTOLIZUMAB (SER toe LIZ oo mab) is used to treat Crohn's disease, psoriatic arthritis, or rheumatoid arthritis. This medicine may be used for other purposes; ask your health care provider or pharmacist if you have questions. What should I tell my health care provider before I take this medicine? They need to know if you have any of these conditions: -diabetes -heart disease -hepatitis B or history of hepatitis B infection -immune system problems -infection or history of infections -low blood counts, like low white cell, platelet, or red cell counts -multiple sclerosis -recently received or scheduled to receive a vaccine -scheduled to have surgery -tuberculosis, a positive skin test for tuberculosis or have recently been in close contact with someone who has tuberculosis -an unusual or allergic reaction to certolizumab, other medicines, foods, dyes, or preservatives -pregnant or trying to get pregnant -breast-feeding How should I use this medicine? This medicine is for injection under the skin. It is usually given by a health care professional in a hospital or clinic setting. If you get this medicine at home, you will be taught how to prepare and give this medicine. Use exactly as directed. Take your medicine at regular intervals. Do not take your medicine more often than directed. It is important that you put your used needles and syringes in a special sharps container. Do not put them in a trash can. If you do not have a sharps container, call your pharmacist or healthcare provider to get one. A special MedGuide will be given to you by the pharmacist with each prescription and refill. Be sure to read this information carefully each time. Talk to your pediatrician regarding the use of this medicine in children. Special care may be needed. Overdosage: If you think you have taken too  much of this medicine contact a poison control center or emergency room at once. NOTE: This medicine is only for you. Do not share this medicine with others. What if I miss a dose? It is important not to miss your dose. Call your doctor or health care professional if you are unable to keep an appointment. If you give yourself the medicine and you miss a dose, take it as soon as you can. If it is almost time for your next dose, take only that dose. Do not take double or extra doses. What may interact with this medicine? Do not take this medicine with any of the following medications: -abatacept -adalimumab -anakinra -etanercept -infliximab -live virus vaccines -rilonacept This medicine may also interact with the following medications: -vaccines This list may not describe all possible interactions. Give your health care provider a list of all the medicines, herbs, non-prescription drugs, or dietary supplements you use. Also tell them if you smoke, drink alcohol, or use illegal drugs. Some items may interact with your medicine. What should I watch for while using this medicine? Visit your doctor or health care professional for regular checks on your progress. Tell your doctor or healthcare professional if your symptoms do not start to get better or if they get worse. Your condition will be monitored carefully while you are receiving this medicine. You will be tested for tuberculosis (TB) before you start this medicine. If your doctor prescribes any medicine for TB, you should start taking the TB medicine before starting this medicine. Make sure to finish the full course of TB medicine. Call your doctor or  health care professional for advice if you get a fever, chills, sore throat, or other symptoms of an infection. Do not treat yourself. This medicine may decrease your body's ability to fight infection. Try to avoid being around people who are sick. Talk to your doctor about your risk of cancer. You  may be more at risk for certain types of cancers if you take this medicine. What side effects may I notice from receiving this medicine? Side effects that you should report to your doctor or health care professional as soon as possible: -allergic reactions like skin rash, itching or hives, swelling of the face, lips, or tongue -breathing problems -changes in vision -chest pain or palpitations -fever or chills, sore throat -pain, tingling, numbness in the hands or feet -red, scaly patches or raised bumps on the skin -seizures -swelling of the ankles, feet, hands -swollen lymph nodes in the neck, underarm, or groin areas -unexplained weight loss -unusual bleeding or bruising -unusually weak or tired Side effects that usually do not require medical attention (report to your doctor or health care professional if they continue or are bothersome): -irritation at site where injected This list may not describe all possible side effects. Call your doctor for medical advice about side effects. You may report side effects to FDA at 1-800-FDA-1088. Where should I keep my medicine? Keep out of the reach of children. If you are using this medicine at home, you will be instructed on how to store this medicine. Throw away any unused medicine after the expiration date on the label. NOTE: This sheet is a summary. It may not cover all possible information. If you have questions about this medicine, talk to your doctor, pharmacist, or health care provider.    2016, Elsevier/Gold Standard. (2012-06-04 10:31:30)

## 2016-07-05 NOTE — Telephone Encounter (Signed)
I have advised patient/ she has  Voiced understanding

## 2016-07-30 ENCOUNTER — Other Ambulatory Visit: Payer: Self-pay | Admitting: Rheumatology

## 2016-07-30 NOTE — Telephone Encounter (Signed)
Last visit and labs 06/13/16 Next visit 08/02/16 Ok to refill per Dr Corliss Skains

## 2016-08-01 DIAGNOSIS — M059 Rheumatoid arthritis with rheumatoid factor, unspecified: Secondary | ICD-10-CM | POA: Insufficient documentation

## 2016-08-01 DIAGNOSIS — Z720 Tobacco use: Secondary | ICD-10-CM | POA: Insufficient documentation

## 2016-08-01 DIAGNOSIS — F17210 Nicotine dependence, cigarettes, uncomplicated: Secondary | ICD-10-CM | POA: Insufficient documentation

## 2016-08-01 DIAGNOSIS — F32A Depression, unspecified: Secondary | ICD-10-CM | POA: Insufficient documentation

## 2016-08-01 DIAGNOSIS — Z79899 Other long term (current) drug therapy: Secondary | ICD-10-CM | POA: Insufficient documentation

## 2016-08-01 DIAGNOSIS — F329 Major depressive disorder, single episode, unspecified: Secondary | ICD-10-CM | POA: Insufficient documentation

## 2016-08-01 DIAGNOSIS — B1920 Unspecified viral hepatitis C without hepatic coma: Secondary | ICD-10-CM | POA: Insufficient documentation

## 2016-08-01 NOTE — Progress Notes (Signed)
Office Visit Note  Patient: JOANI COSMA             Date of Birth: 06/02/1961           MRN: [id number]             PCP: Jalene Mullet, PA-C Referring: Jalene Mullet, PA-C Visit Date: 08/02/2016 Occupation: Unemployed    Subjective Feet pain   History of Present Illness: LOANNE EMERY is a 55 y.o. right-handed female with history of sero positive rheumatoid arthritis. She's been on Cimzia for a month now. She has not noticed much improvement in her symptoms. She still is on prednisone 10 mg a day. She continues to have pain and discomfort in her bilateral feet .She states her hands and feet both swell.  Activities of Daily Living:  Patient reports morning stiffness for 40 minutes.   Patient Denies nocturnal pain.  Difficulty dressing/grooming: Denies Difficulty climbing stairs: Denies Difficulty getting out of chair: Denies Difficulty using hands for taps, buttons, cutlery, and/or writing: Reports   Review of Systems  Constitutional: Negative for fatigue, night sweats, weight gain, weight loss and weakness.  HENT: Positive for mouth dryness. Negative for mouth sores, trouble swallowing, trouble swallowing and nose dryness.   Eyes: Negative for pain, redness, visual disturbance and dryness.  Respiratory: Negative for cough, shortness of breath and difficulty breathing.   Cardiovascular: Negative for chest pain, palpitations, hypertension, irregular heartbeat and swelling in legs/feet.  Gastrointestinal: Negative for blood in stool, constipation and diarrhea.  Endocrine: Negative for increased urination.  Genitourinary: Negative for vaginal dryness.  Musculoskeletal: Positive for arthralgias, joint pain, joint swelling and morning stiffness. Negative for myalgias, muscle weakness, muscle tenderness and myalgias.  Skin: Negative for color change, rash, hair loss, skin tightness, ulcers and sensitivity to sunlight.  Allergic/Immunologic: Negative for susceptible to infections.    Neurological: Negative for dizziness, memory loss and night sweats.  Hematological: Negative for swollen glands.  Psychiatric/Behavioral: Positive for depressed mood. Negative for sleep disturbance. The patient is not nervous/anxious.     PMFS History:  Patient Active Problem List   Diagnosis Date Noted  . Seropositive rheumatoid arthritis ([place]) 08/01/2016  . High risk medication use 08/01/2016  . Hepatitis C 08/01/2016  . Tobacco abuse 08/01/2016  . Depression 08/01/2016  . Complex partial seizure ([place]) 02/02/2016  . MCI (mild cognitive impairment) 11/02/2015  . Arthralgia 11/02/2015  . Carpal tunnel syndrome 10/20/2015  . Syncope 05/17/2015  . Convulsions/seizures ([place]) 05/17/2015    Past Medical History:  Diagnosis Date  . Arthritis 02/01/16   dx w/rheumatoid arthritis  . Carpal tunnel syndrome 10/20/2015   right  . Carpal tunnel syndrome on both sides   . Dysuria   . Hypokalemia   . Liver disease    hepatitis c  . Seasonal allergies   . Seizures ([place])    last 05/05/15    Family History  Problem Relation Age of Onset  . Pancreatic cancer Mother   . COPD Father    History reviewed. No pertinent surgical history. Social History   Social History Narrative  . No narrative on file     Objective: Vital Signs: BP 124/67 (BP Location: Left Arm, Patient Position: Sitting)   Pulse 81   Resp 12   Ht 5' 6" (1.676 m)   Wt 208 lb (94.3 kg)   BMI 33.57 kg/m    Physical Exam  Constitutional: She is oriented to person, place, and time. She appears well-developed and  well-nourished.  HENT:  Head: Normocephalic and atraumatic.  Eyes: Conjunctivae and EOM are normal.  Neck: Normal range of motion.  Cardiovascular: Normal rate, regular rhythm, normal heart sounds and intact distal pulses.   Pulmonary/Chest: Effort normal and breath sounds normal.  Abdominal: Soft. Bowel sounds are normal.  Lymphadenopathy:    She has no cervical adenopathy.  Neurological: She is alert  and oriented to person, place, and time.  Skin: Skin is warm and dry. Capillary refill takes less than 2 seconds.  Psychiatric: She has a normal mood and affect. Her behavior is normal.  Nursing note and vitals reviewed.    Musculoskeletal Exam: C-spine, thoracic spine, lumbar spine good range of motion with no SI joint tenderness. Shoulder joints, elbow joints, wrist joints with good range of motion. She has difficulty making a fist. Synovitis was noted only on her left second MCP joint. Hip joints, knee joints, ankles and MTPs were good range of motion. She is tenderness over left fifth MTP joint and right third PIP joint. But no synovitis was noted.  CDAI Exam: CDAI Homunculus Exam:   Tenderness:  Left hand: 2nd MCP Right foot: 3rd PIP Left foot: 5th MTP  Swelling:  Left hand: 2nd MCP  Joint Counts:  CDAI Tender Joint count: 1 CDAI Swollen Joint count: 1  Global Assessments:  Patient Global Assessment: 5 Provider Global Assessment: 5  CDAI Calculated Score: 12    Investigation: Findings:  October 2017:  Anticardiolipin antibody, beta-2 and lupus anticoagulant were all negative.  ENA was negative.  RAPID-3 score was 5.3, which was high severity. Normal CBC CMP K+ 3.4   Rheumatoid arthritis.  Positive rheumatoid factor, positive CCP and positive 14-3-3 eta.  She has positive ANA and also sicca symptoms with mild synovitis in her right 2nd MCP joint now.  She is doing much better with decreased pain and swelling in her joints on methotrexate, although the oral ulcers have been bothersome for her  Labs from 12/30/2015 show CBC normal, CMP normal, sed rate normal at 9.  Urinalysis is negative; 14-3-3 eta is also 0.2 and positive.  Uric acid is normal at 6.7.  Rheumatoid factor is positive at 255.  CCP is positive at greater than 250.  ANA is positive with a titer of 1:160 and a nonhomogeneous pattern.  HLA-B27 is negative.  Hepatitis panel is negative.  G6PD is normal.  HIV  negative.  Immunoglobulins normal.  SPEP negative.  TB Gold negative.  Hepatitis C was reactive, but hepatitis C RNA/PCR was negative.       Imaging: No results found.  Speciality Comments: No specialty comments available.    Procedures:  No procedures performed Allergies: Patient has no known allergies.   Assessment / Plan:     Visit Diagnoses: Seropositive rheumatoid arthritis ([place]) - Positive rheumatoid factor to 55, positive CCP more than 250, positive 14- 3-3 eta positive ANA 1:160, positive synovitis: She is doing better clinically on Cimzia and prednisone combination although she's still taking 10 mg of prednisone. Today she had synovitis only on her left second MCP joint. Although she does have discomfort in several of her MTP joints. I would like to taper her prednisone to 7.5 mg by mouth daily for a week then reduce it to 5 mg by mouth daily and then 2.5 mg by mouth daily, the taper will be every week. She took her last loading dose of Cimzia in the office today which she tolerated well.  High risk medication use -  unable to tolerate Methotrexate, nausea fatigue. Now on [place]. She will start prednisone taper as prescribed. I will obtain following labs today and then standing orders will be given to check labs every 3 months. - Plan: CBC with Differential/Platelet, COMPLETE METABOLIC PANEL WITH GFR, CBC with Differential/Platelet, COMPLETE METABOLIC PANEL WITH GFR, Hepatitis C RNA quantitative  Hepatitis C virus infection  - In 90s, hep C RNA Quant negative in June 2017. We will check her hep C RNA Quant again today.  She has multiple medical problems which are described as below:  MCI (mild cognitive impairment)  Tobacco abuse: Smoking cessation was emphasized.  complex partial seizures: She is currently not working a couple of seizures and is planning to apply for disability.  Depression,    Orders: Orders Placed This Encounter  Procedures  . CBC with  Differential/Platelet  . COMPLETE METABOLIC PANEL WITH GFR  . CBC with Differential/Platelet  . COMPLETE METABOLIC PANEL WITH GFR  . Hepatitis C RNA quantitative   Meds ordered this encounter  Medications  . predniSONE (DELTASONE) 5 MG tablet    Sig: Take 7.5 mg daily for one week, then 5 mg daily for one week, hen 2.5 mg daily for one week    Dispense:  21 tablet    Refill:  0    Face-to-face time spent with patient was 30 minutes. 50% of time was spent in counseling and coordination of care.  Follow-Up Instructions: Return in about 3 months (around 10/31/2016) for Rheumatoid arthritis.   Bo Merino, MD

## 2016-08-02 ENCOUNTER — Telehealth: Payer: Self-pay | Admitting: Rheumatology

## 2016-08-02 ENCOUNTER — Encounter: Payer: Self-pay | Admitting: Rheumatology

## 2016-08-02 ENCOUNTER — Ambulatory Visit (INDEPENDENT_AMBULATORY_CARE_PROVIDER_SITE_OTHER): Payer: Commercial Managed Care - HMO | Admitting: Rheumatology

## 2016-08-02 VITALS — BP 124/67 | HR 81 | Resp 12 | Ht 66.0 in | Wt 208.0 lb

## 2016-08-02 DIAGNOSIS — Z79899 Other long term (current) drug therapy: Secondary | ICD-10-CM

## 2016-08-02 DIAGNOSIS — G3184 Mild cognitive impairment, so stated: Secondary | ICD-10-CM | POA: Diagnosis not present

## 2016-08-02 DIAGNOSIS — M059 Rheumatoid arthritis with rheumatoid factor, unspecified: Secondary | ICD-10-CM

## 2016-08-02 DIAGNOSIS — Z720 Tobacco use: Secondary | ICD-10-CM | POA: Diagnosis not present

## 2016-08-02 DIAGNOSIS — F329 Major depressive disorder, single episode, unspecified: Secondary | ICD-10-CM

## 2016-08-02 DIAGNOSIS — G40209 Localization-related (focal) (partial) symptomatic epilepsy and epileptic syndromes with complex partial seizures, not intractable, without status epilepticus: Secondary | ICD-10-CM

## 2016-08-02 DIAGNOSIS — B1920 Unspecified viral hepatitis C without hepatic coma: Secondary | ICD-10-CM

## 2016-08-02 DIAGNOSIS — F32A Depression, unspecified: Secondary | ICD-10-CM

## 2016-08-02 LAB — COMPLETE METABOLIC PANEL WITH GFR
ALT: 21 U/L (ref 6–29)
AST: 20 U/L (ref 10–35)
Albumin: 3.8 g/dL (ref 3.6–5.1)
Alkaline Phosphatase: 66 U/L (ref 33–130)
BUN: 10 mg/dL (ref 7–25)
CALCIUM: 9.3 mg/dL (ref 8.6–10.4)
CHLORIDE: 103 mmol/L (ref 98–110)
CO2: 28 mmol/L (ref 20–31)
Creat: 0.95 mg/dL (ref 0.50–1.05)
GFR, EST AFRICAN AMERICAN: 78 mL/min (ref >=60)
GFR, EST NON AFRICAN AMERICAN: 68 mL/min (ref >=60)
Glucose, Bld: 86 mg/dL (ref 65–99)
POTASSIUM: 3.5 mmol/L (ref 3.5–5.3)
Sodium: 141 mmol/L (ref 135–146)
Total Bilirubin: 0.5 mg/dL (ref 0.2–1.2)
Total Protein: 6.1 g/dL (ref 6.1–8.1)

## 2016-08-02 LAB — CBC WITH DIFFERENTIAL/PLATELET
Basophils Absolute: 90 cells/uL (ref 0–200)
Basophils Relative: 2 %
Eosinophils Absolute: 315 cells/uL (ref 15–500)
Eosinophils Relative: 7 %
HEMATOCRIT: 40.7 % (ref 35.0–45.0)
Hemoglobin: 13.2 g/dL (ref 11.7–15.5)
LYMPHS PCT: 40 %
Lymphs Abs: 1800 cells/uL (ref 850–3900)
MCH: 29.7 pg (ref 27.0–33.0)
MCHC: 32.4 g/dL (ref 32.0–36.0)
MCV: 91.5 fL (ref 80.0–100.0)
MONO ABS: 585 {cells}/uL (ref 200–950)
MPV: 10.6 fL (ref 7.5–12.5)
Monocytes Relative: 13 %
NEUTROS PCT: 38 %
Neutro Abs: 1710 cells/uL (ref 1500–7800)
Platelets: 202 10*3/uL (ref 140–400)
RBC: 4.45 MIL/uL (ref 3.80–5.10)
RDW: 12.5 % (ref 11.0–15.0)
WBC: 4.5 10*3/uL (ref 3.8–10.8)

## 2016-08-02 MED ORDER — CERTOLIZUMAB PEGOL 2 X 200 MG/ML ~~LOC~~ KIT: 2.0000 | PACK | SUBCUTANEOUS | 0 refills | Status: DC

## 2016-08-02 MED ORDER — PREDNISONE 5 MG PO TABS: ORAL_TABLET | ORAL | 0 refills | Status: DC

## 2016-08-02 NOTE — Patient Instructions (Signed)
We are going to taper your prednisone dose:  Take 7.5 mg (1 and 1/2 tablet of the 5 mg tablets) daily for one week Then 5 mg  (1 tablet of the 5 mg tablets) daily for one week Then 2.5 mg (1/2 tablets of the 5 mg tablets) daily for one week Then stop  Standing Labs We placed an order today for your standing lab work.    Please come back and get your standing labs in 2 months then every 3 months.    We have open lab Monday through Friday from 8:30-11:30 AM and 1-4 PM at the office of Dr. Arbutus Ped, PA.   The office is located at [address], [place], [place] [address] No appointment is necessary.   Labs are drawn by [facility].  You may receive a bill from [facility] for your lab work.

## 2016-08-02 NOTE — Telephone Encounter (Signed)
Ok to refill per Dr Deveshwar  

## 2016-08-02 NOTE — Progress Notes (Signed)
Rheumatology Medication Review by a Pharmacist Does the patient feel that his/her medications are working for him/her?  Too soon to tell. Patient started on Cimzia on 07/05/16.   Has the patient been experiencing any side effects to the medications prescribed?  Patient reported some bleeding/bruising at her injection site with the last injection.   Does the patient have any problems obtaining medications?  No  Issues to address at subsequent visits: None   Pharmacist comments:  Ms. Salinas is a pleasant 55yo F who presents for follow up of rheumatoid arthritis with positive rheumatoid factor.  Patient was initiated on Cimzia on 07/05/16.  She reports she self-injected the second dose of the loading dose on 07/19/16 and is due for her third injection today.  Patient self-injected her Cimzia today while in the office.  She injected one syringe into the left side of her abdomen and one syringe into the right side of her abdomen (Lot # [id number], exp date: 07/2017).  Discussed with patient that now that she has completed the loading dose she will begin the maintenance dose of 400 mg every 4 weeks.  Patient voiced understanding.    Patient is due for her standing labs (CBC, CMP) today then again in 2 months.  Most recent TB Gold was on 12/28/15 which was negative.  Patient will be due for TB Gold again in April 2018.  Patient has a history of hepatitis C.  On 01/06/16, RNA quant was not detected.  Will plan on repeating RNA quant today and every 6 months.    Patient denies any medication related questions at this time.     Lilla Shook, Pharm.D., BCPS Clinical Pharmacist Pager: [phone] Phone: [phone] 08/02/2016 11:39 AM

## 2016-08-02 NOTE — Telephone Encounter (Signed)
[facility] called about rx for Cimzia. Please send to [facility]. Fax#[phone]

## 2016-08-06 LAB — HEPATITIS C RNA QUANTITATIVE: HCV QUANT: NOT DETECTED [IU]/mL (ref <15)

## 2016-08-06 NOTE — Progress Notes (Signed)
Labs normal.

## 2016-08-16 ENCOUNTER — Telehealth: Payer: Self-pay | Admitting: Rheumatology

## 2016-08-16 NOTE — Telephone Encounter (Signed)
Patient has questions about injections and when she is due for the next one. Please call patient back today because she thinks she is due for the injection today. Thanks!

## 2016-08-16 NOTE — Telephone Encounter (Signed)
Patient states she had Cimzia on 07/05/16, 07/19/16 and 08/02/16. She would like to know when her next injection is due. Left message to advise patient she would be due for her next injection on 07/31/16.

## 2016-08-22 ENCOUNTER — Other Ambulatory Visit: Payer: Self-pay | Admitting: Rheumatology

## 2016-08-22 NOTE — Telephone Encounter (Signed)
Patient states her hands, feet and shoulders are hurting. Patient states there is swelling. Patient is on Cimzia and due for her injection on 08/31/16.

## 2016-08-22 NOTE — Telephone Encounter (Signed)
Okay to give prednisone taper as prescribed before

## 2016-08-22 NOTE — Telephone Encounter (Signed)
Last Visit: 08/02/16 Next Visit: 10/31/16 Labs: 08/02/16 WNL  Okay to refill Prednisone?

## 2016-09-07 ENCOUNTER — Telehealth: Payer: Self-pay | Admitting: Rheumatology

## 2016-09-07 NOTE — Telephone Encounter (Signed)
Patient called about a refill for injections. She wasn't sure if she was supposed to contact the office or [facility] but she is requesting a refill to please be sent to [facility].

## 2016-09-10 NOTE — Telephone Encounter (Signed)
Patient was unsure if she needs to contact [facility] for her refill. Patient states she did not receive a 90 supply. Prescription was sent in for a 90 supply on 08/02/16. Patient advised to contact [facility] for the refill. Patient verbalized understanding.

## 2016-09-20 ENCOUNTER — Other Ambulatory Visit: Payer: Self-pay | Admitting: Rheumatology

## 2016-09-25 ENCOUNTER — Other Ambulatory Visit: Payer: Self-pay | Admitting: Rheumatology

## 2016-09-25 NOTE — Telephone Encounter (Signed)
ok 

## 2016-09-25 NOTE — Telephone Encounter (Signed)
Patient states her hands are throbbing and has been going on for approximately a week.   Last Visit: 08/02/16 Next Visit: 10/31/16  Okay to refill Prednisone?

## 2016-10-08 DIAGNOSIS — I8310 Varicose veins of unspecified lower extremity with inflammation: Secondary | ICD-10-CM | POA: Insufficient documentation

## 2016-10-08 DIAGNOSIS — I872 Venous insufficiency (chronic) (peripheral): Secondary | ICD-10-CM | POA: Insufficient documentation

## 2016-10-22 ENCOUNTER — Other Ambulatory Visit: Payer: Self-pay | Admitting: *Deleted

## 2016-10-22 MED ORDER — CERTOLIZUMAB PEGOL 2 X 200 MG/ML ~~LOC~~ KIT: 2.0000 | PACK | SUBCUTANEOUS | 0 refills | Status: DC

## 2016-10-22 NOTE — Telephone Encounter (Signed)
We will do labs for patient at her Oct 31 2016 office visit. We need labs updated every 3 months and she is due now.

## 2016-10-22 NOTE — Telephone Encounter (Signed)
Refill request received via fax for Cimzia  Last Visit: 08/02/16 Next Visit: 10/31/16 Labs: 08/02/16 WNL TB Gold: 12/30/15 Neg  Okay to refill Cimzia?

## 2016-10-23 DIAGNOSIS — Z87898 Personal history of other specified conditions: Secondary | ICD-10-CM | POA: Insufficient documentation

## 2016-10-23 DIAGNOSIS — M3500 Sicca syndrome, unspecified: Secondary | ICD-10-CM | POA: Insufficient documentation

## 2016-10-23 DIAGNOSIS — Z8619 Personal history of other infectious and parasitic diseases: Secondary | ICD-10-CM | POA: Insufficient documentation

## 2016-10-23 DIAGNOSIS — R768 Other specified abnormal immunological findings in serum: Secondary | ICD-10-CM | POA: Insufficient documentation

## 2016-10-23 NOTE — Progress Notes (Signed)
Office Visit Note  Patient: Christina Jenkins             Date of Birth: April 21, 1961           MRN: [id number]             PCP: Jalene Mullet, PA-C Referring: Jalene Mullet, PA-C Visit Date: 10/31/2016 Occupation: '@GUAROCC'$ @    Subjective:  Pain hands   History of Present Illness: Christina Jenkins is a 56 y.o. female with history of sero positive rheumatoid arthritis. She states she continues to have some discomfort in her hands with intermittent swelling. She also has some discomfort in her both shoulders. She continues to have some sores in her mouth. She's also noticed a rash on her lower extremities which has increased in the last few months. She is seeing her dermatologist needed and few months back and was given a topical steroid therapy.  Activities of Daily Living:  Patient reports morning stiffness for 1 hour.   Patient Denies nocturnal pain.  Difficulty dressing/grooming: Denies Difficulty climbing stairs: Denies Difficulty getting out of chair: Denies Difficulty using hands for taps, buttons, cutlery, and/or writing: Reports   Review of Systems  Constitutional: Positive for fatigue. Negative for night sweats, weight gain, weight loss and weakness.  HENT: Positive for mouth sores. Negative for trouble swallowing, trouble swallowing, mouth dryness and nose dryness.   Eyes: Negative for pain, redness, visual disturbance and dryness.  Respiratory: Negative for cough, shortness of breath and difficulty breathing.   Cardiovascular: Negative for chest pain, palpitations, hypertension, irregular heartbeat and swelling in legs/feet.  Gastrointestinal: Negative for blood in stool, constipation and diarrhea.  Endocrine: Negative for increased urination.  Genitourinary: Negative for vaginal dryness.  Musculoskeletal: Positive for arthralgias, joint pain and morning stiffness. Negative for joint swelling, myalgias, muscle weakness, muscle tenderness and myalgias.  Skin: Positive for rash.  Negative for color change, hair loss, skin tightness, ulcers and sensitivity to sunlight.       Erythematous rash on bilateral lower extremity more so on the left lower extremities with  pearly scaly lesions  Allergic/Immunologic: Negative for susceptible to infections.  Neurological: Negative for dizziness, memory loss and night sweats.  Hematological: Negative for swollen glands.  Psychiatric/Behavioral: Positive for depressed mood. Negative for sleep disturbance. The patient is not nervous/anxious.     PMFS History:  Patient Active Problem List   Diagnosis Date Noted  . History of seizures 10/23/2016  . History of hepatitis C 10/23/2016  . ANA positive 10/23/2016  . Sicca syndrome, unspecified ([place]) 10/23/2016  . Seropositive rheumatoid arthritis ([place]) 08/01/2016  . High risk medication use 08/01/2016  . Hepatitis C 08/01/2016  . Tobacco abuse 08/01/2016  . Depression 08/01/2016  . Complex partial seizure ([place]) 02/02/2016  . MCI (mild cognitive impairment) 11/02/2015  . Arthralgia 11/02/2015  . Carpal tunnel syndrome 10/20/2015  . Syncope 05/17/2015  . Convulsions/seizures ([place]) 05/17/2015    Past Medical History:  Diagnosis Date  . Arthritis 02/01/16   dx w/rheumatoid arthritis  . Carpal tunnel syndrome 10/20/2015   right  . Carpal tunnel syndrome on both sides   . Dysuria   . Hypokalemia   . Liver disease    hepatitis c  . Seasonal allergies   . Seizures ([place])    last 05/05/15    Family History  Problem Relation Age of Onset  . Pancreatic cancer Mother   . COPD Father    No past surgical history on file. Social History  Social History Narrative  . No narrative on file     Objective: Vital Signs: BP 140/84   Pulse 82   Resp 14   Wt 210 lb (95.3 kg)   BMI 33.89 kg/m    Physical Exam  Constitutional: She is oriented to person, place, and time. She appears well-developed and well-nourished.  HENT:  Head: Normocephalic and atraumatic.  She has some  irritation where her denture presses against her lower gumline. No oral ulcers were noted.  Eyes: Conjunctivae and EOM are normal.  Neck: Normal range of motion.  Cardiovascular: Normal rate, regular rhythm, normal heart sounds and intact distal pulses.   Pulmonary/Chest: Effort normal and breath sounds normal.  Abdominal: Soft. Bowel sounds are normal.  Lymphadenopathy:    She has no cervical adenopathy.  Neurological: She is alert and oriented to person, place, and time.  Skin: Skin is warm and dry. Capillary refill takes less than 2 seconds. Rash noted. There is erythema.  Erythematous patches on bilateral lower extremity more prominent on the left lower extremity was pearly erythematous patches  Psychiatric: She has a normal mood and affect. Her behavior is normal.  Nursing note and vitals reviewed.    Musculoskeletal Exam: C-spine and thoracic lumbar spine good range of motion. She is some discomfort range of motion of her shoulders. Elbow joints wrist joint MCPs PIPs DIPs with good range of motion with no synovitis. Hip joints knee joints ankles MTPs PIPs DIPs with good range of motion with no synovitis.  CDAI Exam: CDAI Homunculus Exam:   Joint Counts:  CDAI Tender Joint count: 0 CDAI Swollen Joint count: 0  Global Assessments:  Patient Global Assessment: 3 Provider Global Assessment: 2  CDAI Calculated Score: 5    Investigation: Findings:  Patient will be due for TB Gold again in April 2018/ negative TB gold 12/2015   Labs from 12/30/2015 show CBC normal, CMP normal, sed rate normal at 9.  Urinalysis is negative; 14-3-3 eta is also 0.2 and positive.  Uric acid is normal at 6.7.  Rheumatoid factor is positive at 255.  CCP is positive at greater than 250.  ANA is positive with a titer of 1:160 and a nonhomogeneous pattern.  HLA-B27 is negative.    October 2017:  Anticardiolipin antibody, beta-2 and lupus anticoagulant were all negative.  ENA was negative.   07/16/2016 CBC  normal, CMP normal, hepatitis C RNA Quant negative  Office Visit on 08/02/2016  Component Date Value Ref Range Status  . WBC 08/02/2016 4.5  3.8 - 10.8 K/uL Final  . RBC 08/02/2016 4.45  3.80 - 5.10 MIL/uL Final  . Hemoglobin 08/02/2016 13.2  11.7 - 15.5 g/dL Final  . HCT 08/02/2016 40.7  35.0 - 45.0 % Final  . MCV 08/02/2016 91.5  80.0 - 100.0 fL Final  . MCH 08/02/2016 29.7  27.0 - 33.0 pg Final  . MCHC 08/02/2016 32.4  32.0 - 36.0 g/dL Final  . RDW 08/02/2016 12.5  11.0 - 15.0 % Final  . Platelets 08/02/2016 202  140 - 400 K/uL Final  . MPV 08/02/2016 10.6  7.5 - 12.5 fL Final  . Neutro Abs 08/02/2016 1710  1,500 - 7,800 cells/uL Final  . Lymphs Abs 08/02/2016 1800  850 - 3,900 cells/uL Final  . Monocytes Absolute 08/02/2016 585  200 - 950 cells/uL Final  . Eosinophils Absolute 08/02/2016 315  15 - 500 cells/uL Final  . Basophils Absolute 08/02/2016 90  0 - 200 cells/uL Final  . Neutrophils  Relative % 08/02/2016 38  % Final  . Lymphocytes Relative 08/02/2016 40  % Final  . Monocytes Relative 08/02/2016 13  % Final  . Eosinophils Relative 08/02/2016 7  % Final  . Basophils Relative 08/02/2016 2  % Final  . Smear Review 08/02/2016 Criteria for review not met   Final  . Sodium 08/02/2016 141  135 - 146 mmol/L Final  . Potassium 08/02/2016 3.5  3.5 - 5.3 mmol/L Final  . Chloride 08/02/2016 103  98 - 110 mmol/L Final  . CO2 08/02/2016 28  20 - 31 mmol/L Final  . Glucose, Bld 08/02/2016 86  65 - 99 mg/dL Final  . BUN 08/02/2016 10  7 - 25 mg/dL Final  . Creat 08/02/2016 0.95  0.50 - 1.05 mg/dL Final   Comment:   For patients > or = 56 years of age: The upper reference limit for Creatinine is approximately 13% higher for people identified as African-American.     . Total Bilirubin 08/02/2016 0.5  0.2 - 1.2 mg/dL Final  . Alkaline Phosphatase 08/02/2016 66  33 - 130 U/L Final  . AST 08/02/2016 20  10 - 35 U/L Final  . ALT 08/02/2016 21  6 - 29 U/L Final  . Total Protein  08/02/2016 6.1  6.1 - 8.1 g/dL Final  . Albumin 08/02/2016 3.8  3.6 - 5.1 g/dL Final  . Calcium 08/02/2016 9.3  8.6 - 10.4 mg/dL Final  . GFR, Est African American 08/02/2016 78  >=60 mL/min Final  . GFR, Est Non African American 08/02/2016 68  >=60 mL/min Final  . HCV Quantitative 08/02/2016 Not Detected  <15 IU/mL Final   Comment: No detectable level of HCV RNA.     Marland Kitchen HCV Quantitative Log 08/02/2016 NOT CALC  <1.18 log 10 Final   Comment:   This test utilizes the [place] FDA approved Roche HCV Test Kit by RT-PCR.     Imaging: No results found.  Speciality Comments: Need Hep C quant every 6 months    Procedures:  No procedures performed Allergies: Patient has no known allergies.   Assessment / Plan:     Visit Diagnoses: Seropositive rheumatoid arthritis ([place]) - Positive rheumatoid factor, positive CCP, +14 33 eta,History of positive synovitis. She is doing really well with no synovitis on examination today she gives history of intermittent arthralgias.  High risk medication use - Cimzia every month, Arava 20 mg by mouth daily- Plan: CBC with Differential/Platelet, COMPLETE METABOLIC PANEL WITH GFR, Quantiferon tb gold assay (blood), Hepatitis C RNA quantitative will be checked every 6 months interval.  Sicca syndrome, unspecified ([place]), over-the-counter products were discussed. - Positive ANA 1:160 homogeneous, ENA negative  Patient complains of oral ulcers I do not see any ulcers on examination today although it seems her lower dentures irritating her gumline have advised to see her dentist.  Rash on lower extremities appears to be psoriasis I've advised her to make an appointment with her dermatologist. Also encouraged to use topical steroid.  Tobacco abuse: The smoking cessation was discussed.  History of hepatitis C - Diagnosed in 1990s, hep C RNA Quant negative 07/16/2016  - Plan: Hepatitis C RNA quantitative  History of seizures  Depression, unspecified depression  type  ANA positive    Orders: No orders of the defined types were placed in this encounter.  No orders of the defined types were placed in this encounter.   Face-to-face time spent with patient was 30 minutes. 50% of time was spent in counseling  and coordination of care.  Follow-Up Instructions: Return in about 4 months (around 02/28/2017) for Rheumatoid arthritis.   Bo Merino, MD  Note - This record has been created using Editor, commissioning.  Chart creation errors have been sought, but may not always  have been located. Such creation errors do not reflect on  the standard of medical care.

## 2016-10-26 ENCOUNTER — Other Ambulatory Visit: Payer: Self-pay | Admitting: Rheumatology

## 2016-10-26 NOTE — Telephone Encounter (Signed)
Last Visit: 08/02/16 Next Visit: 10/31/16 Labs: 08/02/16   Okay to refill Arava?

## 2016-10-28 NOTE — Telephone Encounter (Signed)
ok 

## 2016-10-31 ENCOUNTER — Encounter: Payer: Self-pay | Admitting: Rheumatology

## 2016-10-31 ENCOUNTER — Ambulatory Visit (INDEPENDENT_AMBULATORY_CARE_PROVIDER_SITE_OTHER): Payer: Commercial Managed Care - HMO | Admitting: Rheumatology

## 2016-10-31 VITALS — BP 140/84 | HR 82 | Resp 14 | Wt 210.0 lb

## 2016-10-31 DIAGNOSIS — F329 Major depressive disorder, single episode, unspecified: Secondary | ICD-10-CM

## 2016-10-31 DIAGNOSIS — Z720 Tobacco use: Secondary | ICD-10-CM

## 2016-10-31 DIAGNOSIS — M059 Rheumatoid arthritis with rheumatoid factor, unspecified: Secondary | ICD-10-CM

## 2016-10-31 DIAGNOSIS — Z87898 Personal history of other specified conditions: Secondary | ICD-10-CM

## 2016-10-31 DIAGNOSIS — M3500 Sicca syndrome, unspecified: Secondary | ICD-10-CM

## 2016-10-31 DIAGNOSIS — R768 Other specified abnormal immunological findings in serum: Secondary | ICD-10-CM

## 2016-10-31 DIAGNOSIS — Z79899 Other long term (current) drug therapy: Secondary | ICD-10-CM

## 2016-10-31 DIAGNOSIS — Z8619 Personal history of other infectious and parasitic diseases: Secondary | ICD-10-CM

## 2016-10-31 DIAGNOSIS — F32A Depression, unspecified: Secondary | ICD-10-CM

## 2016-10-31 LAB — CBC WITH DIFFERENTIAL/PLATELET
BASOS PCT: 3 %
Basophils Absolute: 144 cells/uL (ref 0–200)
Eosinophils Absolute: 288 cells/uL (ref 15–500)
Eosinophils Relative: 6 %
HCT: 40.5 % (ref 35.0–45.0)
Hemoglobin: 13.9 g/dL (ref 11.7–15.5)
LYMPHS PCT: 32 %
Lymphs Abs: 1536 cells/uL (ref 850–3900)
MCH: 30 pg (ref 27.0–33.0)
MCHC: 34.3 g/dL (ref 32.0–36.0)
MCV: 87.5 fL (ref 80.0–100.0)
MONOS PCT: 15 %
MPV: 10.8 fL (ref 7.5–12.5)
Monocytes Absolute: 720 cells/uL (ref 200–950)
Neutro Abs: 2112 cells/uL (ref 1500–7800)
Neutrophils Relative %: 44 %
Platelets: 210 10*3/uL (ref 140–400)
RBC: 4.63 MIL/uL (ref 3.80–5.10)
RDW: 12.9 % (ref 11.0–15.0)
WBC: 4.8 10*3/uL (ref 3.8–10.8)

## 2016-10-31 MED ORDER — CERTOLIZUMAB PEGOL 2 X 200 MG/ML ~~LOC~~ KIT
400.0000 mg | PACK | Freq: Once | SUBCUTANEOUS | Status: AC
  Administered 2016-10-31: 400 mg via SUBCUTANEOUS

## 2016-10-31 NOTE — Addendum Note (Signed)
Addended byCaffie Damme on: 10/31/2016 11:38 AM   Modules accepted: Orders

## 2016-10-31 NOTE — Patient Instructions (Signed)
Standing Labs We placed an order today for your standing lab work.    Please come back and get your standing labs in May 2018 and every 3 months.   We have open lab Monday through Friday from 8:30-11:30 AM and 1:30-4 PM at the office of Dr. Shaili Deveshwar/Naitik Panwala, PA.   The office is located at [address], [address], [place], [place] [address] No appointment is necessary.   Labs are drawn by [facility].  You may receive a bill from [facility] for your lab work.     

## 2016-10-31 NOTE — Progress Notes (Signed)
Rheumatology Medication Review by a Pharmacist Does the patient feel that his/her medications are working for him/her?  Yes Has the patient been experiencing any side effects to the medications prescribed?  Yes - patient reports some thinning of the hair since starting Cimzia.  I have advised this may be due to Cimzia.  Patient is not overly concerned about this side effect and will continue to monitor.   Does the patient have any problems obtaining medications?  No  Issues to address at subsequent visits: None   Pharmacist comments:  Christina Jenkins is a pleasant 56 yo F who presents for follow up of her rheumatoid arthritis.  She is currently taking Cimzia 400 mg every month and leflunomide 20 mg daily.  Patient brought Cimzia to her visit today.  She reports she is due for her dose today and was asking if the nurse could administer the injections.  Patient confirms she self injected her medication in December and January.  She denies any difficulty with self injecting but reports she prefers to have the medication administered by someone else.    Patient's most recent standing labs were on 08/02/16.  CBC and CMP were normal at that time.  She is due for standing labs today.  Patient has history of positive hepatitis C antibody.  Hepatitis C RNA quantitative is monitored every 6 months.  Most recent hepatitis C RNA quantitative was not detected on 08/02/16.  Will monitor hepatitis C RNA with next labs.  Patient's TB Gold was negative on 12/28/15.  Will check TB Gold with labs today.  Patient denies any questions regarding her medications at this time.    Lilla Shook, Pharm.D., BCPS, CPP Clinical Pharmacist Pager: [phone] Phone: [phone] 10/31/2016 11:07 AM

## 2016-11-01 LAB — COMPLETE METABOLIC PANEL WITH GFR
ALT: 20 U/L (ref 6–29)
AST: 21 U/L (ref 10–35)
Albumin: 3.7 g/dL (ref 3.6–5.1)
Alkaline Phosphatase: 88 U/L (ref 33–130)
BUN: 6 mg/dL — AB (ref 7–25)
CHLORIDE: 101 mmol/L (ref 98–110)
CO2: 27 mmol/L (ref 20–31)
CREATININE: 0.8 mg/dL (ref 0.50–1.05)
Calcium: 9.2 mg/dL (ref 8.6–10.4)
GFR, Est African American: >89 mL/min (ref >=60)
GFR, Est Non African American: 83 mL/min (ref >=60)
Glucose, Bld: 83 mg/dL (ref 65–99)
Potassium: 2.8 mmol/L — ABNORMAL LOW (ref 3.5–5.3)
Sodium: 140 mmol/L (ref 135–146)
Total Bilirubin: 0.4 mg/dL (ref 0.2–1.2)
Total Protein: 6.2 g/dL (ref 6.1–8.1)

## 2016-11-03 LAB — QUANTIFERON TB GOLD ASSAY (BLOOD)
Interferon Gamma Release Assay: NEGATIVE
Mitogen-Nil: 6.84 IU/mL
QUANTIFERON NIL VALUE: 0.06 [IU]/mL
Quantiferon Tb Ag Minus Nil Value: 0 IU/mL

## 2016-11-05 ENCOUNTER — Telehealth: Payer: Self-pay | Admitting: Radiology

## 2016-11-05 NOTE — Telephone Encounter (Signed)
Advised patient and Aundra Millet at her PCP office.

## 2016-11-05 NOTE — Telephone Encounter (Signed)
-----   Message from Pollyann Savoy, MD sent at 11/05/2016 12:49 PM EST ----- K is 2.8. Please call Pt and PCP.

## 2016-11-05 NOTE — Progress Notes (Signed)
K is 2.8. Please call Pt and PCP.

## 2016-12-04 ENCOUNTER — Encounter: Payer: Self-pay | Admitting: Nurse Practitioner

## 2016-12-04 ENCOUNTER — Ambulatory Visit (INDEPENDENT_AMBULATORY_CARE_PROVIDER_SITE_OTHER): Payer: Commercial Managed Care - HMO | Admitting: Nurse Practitioner

## 2016-12-04 VITALS — BP 130/79 | HR 77 | Wt 212.6 lb

## 2016-12-04 DIAGNOSIS — G3184 Mild cognitive impairment, so stated: Secondary | ICD-10-CM | POA: Diagnosis not present

## 2016-12-04 DIAGNOSIS — R5383 Other fatigue: Secondary | ICD-10-CM

## 2016-12-04 DIAGNOSIS — G40209 Localization-related (focal) (partial) symptomatic epilepsy and epileptic syndromes with complex partial seizures, not intractable, without status epilepticus: Secondary | ICD-10-CM

## 2016-12-04 MED ORDER — LEVETIRACETAM ER 500 MG PO TB24: 1000.0000 mg | ORAL_TABLET | Freq: Every day | ORAL | 6 refills | Status: DC

## 2016-12-04 NOTE — Progress Notes (Signed)
[facility]  PATIENT: ADALINA DOPSON DOB: 1961-03-02   REASON FOR VISIT: Follow-up for mild cognitive impairment,  complex partial seizures, increased fatigue HISTORY FROM:patient     HISTORY OF PRESENT ILLNESS:PSMs Mikkelsen is a 56 year old pleasant Caucasian lady who had an witness episode on 05/05/15 of brief loss of consciousness. 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 The patient does not remember what happened for the next few minutes but the next thing she remembers is a lady who is holding her and patient had collapsed into her seat. She did not have any headache, tongue bite, incontinence or injuries. The eye witness lady apparently described some clonic movements to the EMS. Patient was taken to the emergency room at [facility] where basic lab work and CT scan of the head which I have reviewed was unremarkable. Patient quickly returned back to her baseline though she did feel tired for a while. She was not started on any seizure medications. Patient states she's done well since then has not had any recurrent episodes of passing out. She denies specifically any triggers, chest pain, shortness of breath, palpitations, sweating prior to this episode. She has had no prior episodes of syncope or presyncopal events or seizures. There is no history of significant head injury with loss of consciousness, strokes or seizures. There is no family history of epilepsy either. Patient was on Wellbutrin for the last 1 year prior and was trying to quit smoking. She has stopped Wellbutrin since this episode. Update 07/26/2015 :PS She returns for follow-up after last visit 2 months ago. She's had no further episodes of loss of consciousness or seizure-like activity. She informs me that she had been on Wellbutrin 2 months prior to that episode and has since stopped it. She had an  EEG done on 06/06/15 in our office which suggested intermittent rhythmic left temporal slowing concerning for seizures hence I ordered her prolonged 1 hour EEG study which was done on 07/06/15 which showed intermittent bitemporal slowing but no definite epileptiform activity. Patient was started on Keppra XR 500 mg daily following the second abnormal EEG and she seems to be tolerating it well. She takes it at night as it makes her sleepy. She works as a Teacher, early years/pre and is concerned about whether she will ever be able to go back to her prior job. 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 Update 3/1/2017PS : She returns for follow-up after last visit 3 months ago. She is accompanied by her son who provides most of the history. Patient has been complaining of one-year history of intermittent myalgias and arthralgias which seem to have gotten worse over the last 3 months. She now reports some swelling of her hand joints as well as some tender spots. She was seen by orthopedic surgeon who ordered no conduction EMG which was done on 10/20/15 by Dr. Jannifer Franklin and showed only mild right-sided carpal tunnel. Patient is also concerned of her poor short-term memory as well as forgetfulness. Son noted that at least once a day As well as spacing out and briefly not being responsive. This does not last long. He has not witnessed any lipsmacking, drooling, tongue bite or incontinence. Patient states she has been compliant with taking her Keppra XR 500 mg daily. She has not been driving. She has not been able to return back to  work  UPDATE 06/01/2017CM Ms. 28, 56 year old female returns for follow-up. She was last seen in the office by Dr. Leonie Man 11/02/15. At that time because she had episodes of spacing out and not responding, her Keppra dose was increased to 1000 mg daily. In addition CT and memory labs were ordered along with EEG. CT of the head was normal memory labs were normal. EEG on 11/25/15 showing mild  bilateral slowing of brain wave activity which is nonspecific but no definite seizure activity noted. She has been diagnosed with rheumatoid arthritis. Her last generalized seizure activity was in September. She denies any partial seizures since her visit in March with her Keppra dose increase. She is currently not driving. She returns for reevaluation   UPDATE 10/2/17CM Ms Paz, 56 year old female returns for follow-up she has a history of seizure disorder and mild cognitive impairment. She is currently on Keppra XR 500 mg tablets 2 daily without further seizure activity since last seen. CT scan of the head was normal memory labs return normal. EEG 11/25/2015 shows mild bilateral slowing of brain wave activity which is nonspecific but no definite seizure activity noted. She is being followed for rheumatoid arthritis by Dr. Particia Lather.  She is currently driving without difficulty. She returns for reevaluation. She states she has applied  for disability UPDATE 04/03/2018CM Ms. 73, 56 year old female returns for follow-up. She has a history of seizure disorder and is currently on Keppra 500 mg 2 tablets daily. She has not had further  seizure activity since last seen. In addition she complains with mild cognitive impairment. MMSE today is 27 out of 30. She complains with a lot of fatigue. She also has a history of depression. She has never had a sleep study. She returns for reevaluation REVIEW OF SYSTEMS: Full 14 system review of systems performed and notable only for those listed, all others are neg:  Constitutional: neg  Cardiovascular: neg Ear/Nose/Throat: Hearing loss Skin: neg Eyes: neg Respiratory: neg Gastroitestinal: neg  Hematology/Lymphatic: neg  Endocrine: Excessive thirst Musculoskeletal Joint pain joint swelling , neck pain Allergy/Immunology: neg Neurological: Memory loss, speech difficulty Psychiatric: Depression and anxiety Sleep : neg   ALLERGIES: No Known Allergies  HOME  MEDICATIONS: Outpatient Medications Prior to Visit  Medication Sig Dispense Refill  . acetaminophen (TYLENOL) 325 MG tablet Take 650 mg by mouth daily as needed.     . Certolizumab Pegol 2 X 200 MG/ML KIT Inject 2 Syringes into the skin every 30 (thirty) days. 3 kit 0  . DULoxetine (CYMBALTA) 60 MG capsule Take 120 mg by mouth daily.   0  . folic acid (FOLVITE) 1 MG tablet 1 mg daily.    . furosemide (LASIX) 20 MG tablet take 1 tablet by mouth every morning if needed for SWELLING  0  . leflunomide (ARAVA) 20 MG tablet take 1 tablet by mouth once daily 30 tablet 2  . levETIRAcetam (KEPPRA XR) 500 MG 24 hr tablet Take 2 tablets (1,000 mg total) by mouth daily. 60 tablet 6  . mometasone (ELOCON) 0.1 % ointment apply to affected area once daily ---TO LOWER LEGS UNDER ACE WRAPS  0  . traMADol (ULTRAM) 50 MG tablet   0   No facility-administered medications prior to visit.     PAST MEDICAL HISTORY: Past Medical History:  Diagnosis Date  . Arthritis 02/01/16   dx w/rheumatoid arthritis  . Carpal tunnel syndrome 10/20/2015   right  . Carpal tunnel syndrome on both sides   . Dysuria   .  Hypokalemia   . Liver disease    hepatitis c  . Seasonal allergies   . Seizures ([place])    last 05/05/15    PAST SURGICAL HISTORY: History reviewed. No pertinent surgical history.  FAMILY HISTORY: Family History  Problem Relation Age of Onset  . Pancreatic cancer Mother   . COPD Father     SOCIAL HISTORY: Social History   Social History  . Marital status: Married    Spouse name: N/A  . Number of children: N/A  . Years of education: N/A   Occupational History  . Not on file.   Social History Main Topics  . Smoking status: Current Every Day Smoker    Packs/day: 1.00  . Smokeless tobacco: Never Used  . Alcohol use No  . Drug use: No  . Sexual activity: Not on file   Other Topics Concern  . Not on file   Social History Narrative  . No narrative on file     PHYSICAL EXAM  Vitals:     12/04/16 0945  BP: 130/79  Pulse: 77  Weight: 212 lb 9.6 oz (96.4 kg)   Body mass index is 34.31 kg/m.  Generalized: Well developed, obese female in no acute distress  Head: normocephalic and atraumatic,. Oropharynx benign  Neck: Supple, no carotid bruits  Cardiac: Regular rate rhythm, no murmur  Musculoskeletal: No deformity   Neurological examination   Mentation: Alert , AFT 9. Clock drawing 4/4.  MMSE - Mini Mental State Exam 12/04/2016 06/04/2016 02/02/2016  Orientation to time '5 5 5  ' Orientation to Place '5 5 5  ' Registration '3 3 3  ' Attention/ Calculation '5 5 5  ' Recall '2 2 3  ' Language- name 2 objects '2 2 2  ' Language- repeat 0 1 0  Language- repeat-comments - - left 's' off if  Language- follow 3 step command '3 3 3  ' Language- read & follow direction '1 1 1  ' Write a sentence '1 1 1  ' Copy design 0 1 0  Total score '27 29 28   ' Follows all commands speech and language fluent. ESS4. FSS 40.   Cranial nerve II-XII: .Pupils were equal round reactive to light extraocular movements were full, visual field were full on confrontational test. Facial sensation and strength were normal. hearing was intact to finger rubbing bilaterally. Uvula tongue midline. head turning and shoulder shrug were normal and symmetric.Tongue protrusion into cheek strength was normal. Motor: normal bulk and tone, full strength in the BUE, BLE, fine finger movements normal, no pronator drift. Sensory: normal and symmetric to light touch, in the upper and lower extremities Coordination: finger-nose-finger, heel-to-shin bilaterally, no dysmetria Reflexes: 1+ upper lower and symmetric,  plantar responses were flexor bilaterally. Gait and Station: Rising up from seated position without assistance, normal stance,  moderate stride, good arm swing, smooth turning, able to perform tiptoe, and heel walking without difficulty. Tandem gait is steady DIAGNOSTIC DATA (LABS, IMAGING, TESTING) - Lab Results  Component Value Date    VITAMINB12 531 11/02/2015   Lab Results  Component Value Date   TSH 3.930 11/02/2015      ASSESSMENT AND PLAN 9 year Caucasian lady with single episode of brief loss of consciousness possible Complex partial Seizure.Recent short-term memory and cognitive difficulties of unclear etiology possibly unwitnessed seizures. CT of the head was normal memory labs were normal. EEG on 11/25/15 showing mild bilateral slowing of brain wave activity which is nonspecific but no definite seizure activity noted. Patient continues to complain of a lot  of fatigue. She has never had a sleep study The patient is a current patient of Dr. Leonie Man who is out of the office today . This note is sent to the work in doctor.     Continue Keppra at current dose 1000 mg ER daily willl refill Call for any seizure activity Mild cognitive impairment stable   Will set up for sleep evaluation for fatigue, ESS 4, FSS 40.  I explained in particular the risks and ramifications of untreated moderate to severe OSA, especially with respect to cardiovascular disease  including congestive heart failure, difficult to treat hypertension, cardiac arrhythmias, or stroke. Even type 2 diabetes has, in part, been linked to untreated OSA. Symptoms of untreated OSA include daytime sleepiness, memory problems, mood irritability and mood disorder such as depression and anxiety, lack of energy, as well as recurrent headaches, especially morning headaches. We talked about trying to maintain a healthy lifestyle in general, as well as the importance of weight control. I encouraged the patient to eat healthy, exercise daily and keep well hydrated, to keep a scheduled bedtime and wake time routine, to not skip any meals and eat healthy snacks in between meals I spent 25 minutes in total face to face time with the patient more than 50% of which was spent counseling and coordination of care, reviewing test results reviewing medications and discussing and  reviewing the diagnosis of obstructive sleep apnea, mild cognitive impairment and seizure disorder and further treatment options. , Follow up with me in 6 months Dennie Bible, [facility], [facility], APRN  [facility] [address], [place], [place] [address] [phone]

## 2016-12-04 NOTE — Patient Instructions (Signed)
Continue Keppra at current dose 1000 mg ER daily willl refill Call for any seizure activity Will set up for sleep evaluation I explained in particular the risks and ramifications of untreated moderate to severe OSA, especially with respect to cardiovascular disease  including congestive heart failure, difficult to treat hypertension, cardiac arrhythmias, or stroke. Even type 2 diabetes has, in part, been linked to untreated OSA. Symptoms of untreated OSA include daytime sleepiness, memory problems, mood irritability and mood disorder such as depression and anxiety, lack of energy, as well as recurrent headaches, especially morning headaches. We talked about trying to maintain a healthy lifestyle in general, as well as the importance of weight control. I encouraged the patient to eat healthy, exercise daily and keep well hydrated, to keep a scheduled bedtime and wake time routine, to not skip any meals and eat healthy snacks in between meals Follow up with me in 6 months

## 2016-12-06 NOTE — Progress Notes (Signed)
I have reviewed and agreed above plan. 

## 2017-01-28 ENCOUNTER — Other Ambulatory Visit: Payer: Self-pay | Admitting: Rheumatology

## 2017-01-29 NOTE — Telephone Encounter (Signed)
Last Visit: 10/31/16 Next Visit: 02/28/17 Labs: 10/31/16 Potassium 2.8 (PCP was advised) Everything else WNL  Okay to refill Arava?

## 2017-01-29 NOTE — Telephone Encounter (Signed)
ok 

## 2017-01-30 ENCOUNTER — Other Ambulatory Visit: Payer: Self-pay | Admitting: Rheumatology

## 2017-01-30 NOTE — Telephone Encounter (Signed)
Patient called requesting a refill on Cimzia injections.  She uses [facility] for this.  I checked her chart and did not see this on the list of medication.  CB#[phone].  Thank you

## 2017-01-31 ENCOUNTER — Telehealth: Payer: Self-pay

## 2017-01-31 ENCOUNTER — Institutional Professional Consult (permissible substitution): Payer: Commercial Managed Care - HMO | Admitting: Neurology

## 2017-01-31 MED ORDER — CERTOLIZUMAB PEGOL 2 X 200 MG/ML ~~LOC~~ KIT
2.0000 | PACK | SUBCUTANEOUS | 0 refills | Status: DC
Start: 2017-01-31 — End: 2017-04-02

## 2017-01-31 NOTE — Telephone Encounter (Signed)
Ok, please make sure that pt. Fu with her Cp for low K

## 2017-01-31 NOTE — Telephone Encounter (Signed)
Last Visit: 10/31/16 Next Visit: 02/28/17 Labs: 10/31/16 Potassium 2.8 (PCP was advised) Everything else WNL TB Gold: 10/31/16 Neg  Okay to refill Cimzia?

## 2017-01-31 NOTE — Telephone Encounter (Signed)
Patient did follow up with PCP.

## 2017-01-31 NOTE — Telephone Encounter (Signed)
Patient arrived late for appt, had to r/s

## 2017-02-01 ENCOUNTER — Other Ambulatory Visit: Payer: Self-pay | Admitting: Rheumatology

## 2017-02-13 ENCOUNTER — Institutional Professional Consult (permissible substitution): Payer: Commercial Managed Care - HMO | Admitting: Neurology

## 2017-02-19 ENCOUNTER — Other Ambulatory Visit: Payer: Self-pay | Admitting: *Deleted

## 2017-02-19 NOTE — Telephone Encounter (Signed)
Refill request received via fax  Last Visit: 10/31/16 Next Visit: 02/28/17  Okay to refill Folic Acid?

## 2017-02-28 ENCOUNTER — Ambulatory Visit (INDEPENDENT_AMBULATORY_CARE_PROVIDER_SITE_OTHER): Payer: 59 | Admitting: Rheumatology

## 2017-02-28 ENCOUNTER — Encounter: Payer: Self-pay | Admitting: Rheumatology

## 2017-02-28 VITALS — BP 124/76 | HR 74 | Resp 14 | Ht 66.0 in | Wt 212.0 lb

## 2017-02-28 DIAGNOSIS — Z87898 Personal history of other specified conditions: Secondary | ICD-10-CM | POA: Diagnosis not present

## 2017-02-28 DIAGNOSIS — Z8619 Personal history of other infectious and parasitic diseases: Secondary | ICD-10-CM | POA: Diagnosis not present

## 2017-02-28 DIAGNOSIS — M3500 Sicca syndrome, unspecified: Secondary | ICD-10-CM

## 2017-02-28 DIAGNOSIS — M79642 Pain in left hand: Secondary | ICD-10-CM | POA: Diagnosis not present

## 2017-02-28 DIAGNOSIS — M79641 Pain in right hand: Secondary | ICD-10-CM | POA: Diagnosis not present

## 2017-02-28 DIAGNOSIS — B1920 Unspecified viral hepatitis C without hepatic coma: Secondary | ICD-10-CM | POA: Diagnosis not present

## 2017-02-28 DIAGNOSIS — M059 Rheumatoid arthritis with rheumatoid factor, unspecified: Secondary | ICD-10-CM | POA: Diagnosis not present

## 2017-02-28 DIAGNOSIS — Z79899 Other long term (current) drug therapy: Secondary | ICD-10-CM | POA: Diagnosis not present

## 2017-02-28 LAB — CBC WITH DIFFERENTIAL/PLATELET
Basophils Absolute: 120 cells/uL (ref 0–200)
Basophils Relative: 2 %
Eosinophils Absolute: 360 cells/uL (ref 15–500)
Eosinophils Relative: 6 %
HEMATOCRIT: 42.4 % (ref 35.0–45.0)
Hemoglobin: 13.9 g/dL (ref 11.7–15.5)
LYMPHS PCT: 34 %
Lymphs Abs: 2040 cells/uL (ref 850–3900)
MCH: 28.9 pg (ref 27.0–33.0)
MCHC: 32.8 g/dL (ref 32.0–36.0)
MCV: 88.1 fL (ref 80.0–100.0)
MONO ABS: 720 {cells}/uL (ref 200–950)
MPV: 10.6 fL (ref 7.5–12.5)
Monocytes Relative: 12 %
NEUTROS PCT: 46 %
Neutro Abs: 2760 cells/uL (ref 1500–7800)
Platelets: 216 10*3/uL (ref 140–400)
RBC: 4.81 MIL/uL (ref 3.80–5.10)
RDW: 13.4 % (ref 11.0–15.0)
WBC: 6 10*3/uL (ref 3.8–10.8)

## 2017-02-28 NOTE — Progress Notes (Signed)
Office Visit Note  Patient: Christina Jenkins             Date of Birth: 29-Jun-1961           MRN: [id number]             PCP: Jalene Mullet, PA-C Referring: Jalene Mullet, PA-C Visit Date: 02/28/2017 Occupation: '@GUAROCC' @    Subjective:  Medication Management and Rash (lower legs/ dermatologist states it is from [place] )   History of Present Illness: Christina Jenkins is a 56 y.o. female  With a history of seropositive rheumatoid arthritis. She saw Dr. Estanislado Pandy 10/31/2016. History of positive rheumatoid factor, positive CCP, positive 14-3-3 eta.  At that time she was already on Cimzia 400 mg every month (which she self administered) and Arava 20 mg every day . At the last visit in February, patient was reporting that she was having rash to her lower legs and it was documented by Dr. Estanislado Pandy on that February 2018 visit. Since that visit, patient did follow with her dermatologist. The dermatologist did research and feels that patient's rash is indeed coming from the [place]. She has been compliant in taking Cimzia every month without fail. The dermatologist did give patient a steroid cream with helps with the itching but the rash does not go away. Patient recalls that the rash started about December and continued in January and then she was seen by Dr. Estanislado Pandy in February. Since that appointment, her rash not only is continued but has gotten worse enthesis it spread more.  The rash is isolated to her lower legs starting at below her knee all the way down to her ankle. The rash is not painful, for the itch, she gets good resolution with the steroid cream (which pt uses everyday).  Note: Patient initially saw [place] in our office 12/28/2015. Approximately 02/01/2016, at her follow-up visit, we started the patient on methotrexate.  Despite being on methotrexate, she had ongoing joint pain and swelling (but partial improvement) and ongoing oral ulcers. Therefore patient was discontinued off  of methotrexate and we started her on Arava 20 mg daily.  In addition, patient had hepatitis C antibody reactive; hep B surface antigen and hepatitis core antibody were both negative. In addition, hepatitis C RNA quantitative was not detected.  In 06/13/2016, we wanted add Enbrel to patient's treatment plan the patient's insurance would not approve Enbrel but did approve Cimzia. She started Cimzia approximately November 2017.    Activities of Daily Living:  Patient reports morning stiffness for 30 minutes.   Patient Reports nocturnal pain.  Difficulty dressing/grooming: Denies Difficulty climbing stairs: Denies Difficulty getting out of chair: Denies Difficulty using hands for taps, buttons, cutlery, and/or writing: Reports   Review of Systems  Constitutional: Negative for fatigue.  HENT: Negative for mouth sores and mouth dryness.   Eyes: Negative for dryness.  Respiratory: Negative for shortness of breath.   Gastrointestinal: Negative for constipation and diarrhea.  Musculoskeletal: Negative for myalgias and myalgias.  Skin: Negative for sensitivity to sunlight.  Psychiatric/Behavioral: Negative for decreased concentration and sleep disturbance.    PMFS History:  Patient Active Problem List   Diagnosis Date Noted  . Fatigue 12/04/2016  . History of seizures 10/23/2016  . History of hepatitis C 10/23/2016  . ANA positive 10/23/2016  . Sicca syndrome, unspecified ([place]) 10/23/2016  . Seropositive rheumatoid arthritis ([place]) 08/01/2016  . High risk medication use 08/01/2016  . Hepatitis C 08/01/2016  . Tobacco abuse 08/01/2016  .  Depression 08/01/2016  . Complex partial seizure ([place]) 02/02/2016  . MCI (mild cognitive impairment) 11/02/2015  . Arthralgia 11/02/2015  . Carpal tunnel syndrome 10/20/2015  . Syncope 05/17/2015  . Convulsions/seizures ([place]) 05/17/2015    Past Medical History:  Diagnosis Date  . Arthritis 02/01/16   dx w/rheumatoid arthritis  . Carpal tunnel  syndrome 10/20/2015   right  . Carpal tunnel syndrome on both sides   . Dysuria   . Hypokalemia   . Liver disease    hepatitis c  . Seasonal allergies   . Seizures ([place])    last 05/05/15    Family History  Problem Relation Age of Onset  . Pancreatic cancer Mother   . COPD Father    History reviewed. No pertinent surgical history. Social History   Social History Narrative  . No narrative on file     Objective: Vital Signs: BP 124/76   Pulse 74   Resp 14   Ht '5\' 6"'  (1.676 m)   Wt 212 lb (96.2 kg)   BMI 34.22 kg/m    Physical Exam  Constitutional: She is oriented to person, place, and time. She appears well-developed and well-nourished.  HENT:  Head: Normocephalic and atraumatic.  Eyes: EOM are normal. Pupils are equal, round, and reactive to light.  Cardiovascular: Normal rate, regular rhythm and normal heart sounds.  Exam reveals no gallop and no friction rub.   No murmur heard. Pulmonary/Chest: Effort normal and breath sounds normal. She has no wheezes. She has no rales.  Abdominal: Soft. Bowel sounds are normal. She exhibits no distension. There is no tenderness. There is no guarding. No hernia.  Musculoskeletal: Normal range of motion. She exhibits no edema, tenderness or deformity.  Lymphadenopathy:    She has no cervical adenopathy.  Neurological: She is alert and oriented to person, place, and time. Coordination normal.  Skin: Skin is warm and dry. Capillary refill takes less than 2 seconds. No rash noted.  Psychiatric: She has a normal mood and affect. Her behavior is normal.  Nursing note and vitals reviewed.    Musculoskeletal Exam:  Full range of motion of all joints Grip strength is equal and strong bilaterally For myalgia tender points are all absent  CDAI Exam: CDAI Homunculus Exam:   Tenderness:  Right hand: 1st MCP, 2nd MCP and 3rd MCP Left hand: 1st MCP, 2nd MCP and 3rd MCP  Swelling:  Right hand: 1st MCP, 2nd MCP and 3rd MCP Left hand: 1st  MCP, 2nd MCP and 3rd MCP  Joint Counts:  CDAI Tender Joint count: 6 CDAI Swollen Joint count: 6  Global Assessments:  Patient Global Assessment: 4 Provider Global Assessment: 4  CDAI Calculated Score: 20    Investigation: No additional findings. Office Visit on 10/31/2016  Component Date Value Ref Range Status  . WBC 10/31/2016 4.8  3.8 - 10.8 K/uL Final  . RBC 10/31/2016 4.63  3.80 - 5.10 MIL/uL Final  . Hemoglobin 10/31/2016 13.9  11.7 - 15.5 g/dL Final  . HCT 10/31/2016 40.5  35.0 - 45.0 % Final  . MCV 10/31/2016 87.5  80.0 - 100.0 fL Final  . MCH 10/31/2016 30.0  27.0 - 33.0 pg Final  . MCHC 10/31/2016 34.3  32.0 - 36.0 g/dL Final  . RDW 10/31/2016 12.9  11.0 - 15.0 % Final  . Platelets 10/31/2016 210  140 - 400 K/uL Final  . MPV 10/31/2016 10.8  7.5 - 12.5 fL Final  . Neutro Abs 10/31/2016 2112  1,500 -  7,800 cells/uL Final  . Lymphs Abs 10/31/2016 1536  850 - 3,900 cells/uL Final  . Monocytes Absolute 10/31/2016 720  200 - 950 cells/uL Final  . Eosinophils Absolute 10/31/2016 288  15 - 500 cells/uL Final  . Basophils Absolute 10/31/2016 144  0 - 200 cells/uL Final  . Neutrophils Relative % 10/31/2016 44  % Final  . Lymphocytes Relative 10/31/2016 32  % Final  . Monocytes Relative 10/31/2016 15  % Final  . Eosinophils Relative 10/31/2016 6  % Final  . Basophils Relative 10/31/2016 3  % Final  . Smear Review 10/31/2016 Criteria for review not met   Final  . Sodium 10/31/2016 140  135 - 146 mmol/L Final  . Potassium 10/31/2016 2.8* 3.5 - 5.3 mmol/L Final  . Chloride 10/31/2016 101  98 - 110 mmol/L Final  . CO2 10/31/2016 27  20 - 31 mmol/L Final  . Glucose, Bld 10/31/2016 83  65 - 99 mg/dL Final  . BUN 10/31/2016 6* 7 - 25 mg/dL Final  . Creat 10/31/2016 0.80  0.50 - 1.05 mg/dL Final   Comment:   For patients > or = 56 years of age: The upper reference limit for Creatinine is approximately 13% higher for people identified as African-American.     . Total  Bilirubin 10/31/2016 0.4  0.2 - 1.2 mg/dL Final  . Alkaline Phosphatase 10/31/2016 88  33 - 130 U/L Final  . AST 10/31/2016 21  10 - 35 U/L Final  . ALT 10/31/2016 20  6 - 29 U/L Final  . Total Protein 10/31/2016 6.2  6.1 - 8.1 g/dL Final  . Albumin 10/31/2016 3.7  3.6 - 5.1 g/dL Final  . Calcium 10/31/2016 9.2  8.6 - 10.4 mg/dL Final  . GFR, Est African American 10/31/2016 >89  >=60 mL/min Final  . GFR, Est Non African American 10/31/2016 83  >=60 mL/min Final  . Interferon Gamma Release Assay 10/31/2016 NEGATIVE  NEGATIVE Final   Negative test result. M. tuberculosis complex infection unlikely.  . Quantiferon Nil Value 10/31/2016 0.06  IU/mL Final  . Mitogen-Nil 10/31/2016 6.84  IU/mL Final  . Quantiferon Tb Ag Minus Nil Value 10/31/2016 0.00  IU/mL Final   Comment:   The Nil tube value is used to determine if the patient has a preexisting immune response which could cause a false-positive reading on the test. In order for a test to be valid, the Nil tube must have a value of less than or equal to 8.0 IU/mL.   The mitogen control tube is used to assure the patient has a healthy immune status and also serves as a control for correct blood handling and incubation. It is used to detect false-negative readings. The mitogen tube must have a gamma interferon value of greater than or equal to 0.5 IU/mL higher than the value of the Nil tube.   The TB antigen tube is coated with the M. tuberculosis specific antigens. For a test to be considered positive, the TB antigen tube value minus the Nil tube value must be greater than or equal to 0.35 IU/mL.   For additional information, please refer to http://education.questdiagnostics.com/faq/QFT (This link is being provided for informational/educational purposes only.)       Imaging: No results found.             Lesions to lower legs only as photographed above. Lesions itch. They're erythematous. They started approximately  January/Fabry 2018. Patient feels that they occurred after she started Cimzia in November 2017.  She seen dermatologists was also agreeable that these lesions are probably from starting the [place]. This gotten worse and she started using the Cimzia. Has had no improvement/resolution. Dermatologists is giving steroid cream to rub on every day to the lesions to minimize patient's itching. Rash is not painful. Lesions are macular; not papular; well-circumscribed; some dryness and scaling.  Speciality Comments: Need Hep C quant every 6 months    Procedures:  No procedures performed Allergies: Patient has no known allergies.   Assessment / Plan:     Visit Diagnoses: Seropositive rheumatoid arthritis ([place])  High risk medication use - 02/28/2017:Started Cimzia approximately November 2017Continued Arava 20 mg daily(failed mtx may 2017 --> switched to arava aug 2017) - Plan: CBC with Differential/Platelet, COMPLETE METABOLIC PANEL WITH GFR, Hepatitis C RNA quantitative  Bilateral hand pain  Sicca syndrome, unspecified (H[facility])  History of hepatitis C - Plan: Hepatitis C RNA quantitative  History of seizures  Hepatitis C virus infection   High risk medication use - unable to tolerate Methotrexate, nausea fatigueArava and Cimzia - Plan: CBC with Differential/Platelet, COMPLETE METABOLIC PANEL WITH GFR, Hepatitis C RNA quantitative  High risk medication use - Cimzia - Plan: CBC with Differential/Platelet, COMPLETE METABOLIC PANEL WITH GFR, Hepatitis C RNA quantitative  History of hepatitis C - Diagnosed in 1990s, hep C RNA Quant negative 07/16/2016  - Plan: Hepatitis C RNA quantitative   Plan: #1: Seropositive rheumatoid arthritis. Synovitis noted to bilateral first second third MCP joint. Lower extremity shows evidence of new lesions which started after Cimzia was started. (Lesions are consistent with psoriasis).  #2: High risk prescription Cimzia every month (started approximately  November 2017); possible psoriasis eruption after starting Cimzia. Arava 20 mg daily Note: Patient initially was started with methotrexate in approximately late April/May 2017. Joints Better but patient had oral ulcers and methotrexate was discontinued and Arava was started. Patient had inadequate response to [place] was added. Possibility of psoriasis lesion starting due to Cimzia.  February 2018 ===> TB Gold: negative   #3: Bilateral hand pain==> probably due to inadequate response to Cimzia and Arava  #4: History of hepatitis C infection. Please see previous note for full details  #5: Stop Cimzia; continue Arava Switch to Orencia: Handout and consent on Orencia Return to clinic in 1 month so we can give the first Orencia injection in office.  #6: Return to clinic in 3 months from today (we will monitor patient's joints and lesions for improvement.) Photographs of the lesions have been taken today to use is comparison in future.   Orders: No orders of the defined types were placed in this encounter.  No orders of the defined types were placed in this encounter.   Face-to-face time spent with patient was 30 minutes. 50% of time was spent in counseling and coordination of care.  Follow-Up Instructions: Return in about 3 months (around 05/31/2017) for RA, bil hand pain, Ps --> ? from cimzia; switch to Orencia july '18; cont. arava 13m qd.   NEliezer Lofts PA-C   I examined and evaluated the patient with NEliezer LoftsPA. On examination her rash appears to be psoriasis. This could be a one of the side effects from anti-TNF inhibitors. Different treatment options were discussed with patient today and during their side effects. She was in agreement to proceed with Orencia. We will apply for Orencia once approved we [place] The plan of care was discussed as noted above.  SBo Merino MD  Note - This record has been created using [employer].  Chart creation errors have been sought, but may not always  have been located. Such creation errors do not reflect on  the standard of medical care.

## 2017-02-28 NOTE — Progress Notes (Signed)
Pharmacy Note  Subjective: Patient presents today to the [facility] to see Dr. Gabrielle Dare. [place].  Patient is currently taking Cimzia 400 mg once a month.  She is currently having rash on her legs which was felt to be psoriasis .  Decision was made to change therapy.  Will try to apply for Orencia.  Patient seen by the pharmacist for counseling on subcutaneous Orencia.    Objective: CBC    Component Value Date/Time   WBC 4.8 10/31/2016 1202   RBC 4.63 10/31/2016 1202   HGB 13.9 10/31/2016 1202   HCT 40.5 10/31/2016 1202   PLT 210 10/31/2016 1202   MCV 87.5 10/31/2016 1202   MCH 30.0 10/31/2016 1202   MCHC 34.3 10/31/2016 1202   RDW 12.9 10/31/2016 1202   LYMPHSABS 1,536 10/31/2016 1202   MONOABS 720 10/31/2016 1202   EOSABS 288 10/31/2016 1202   BASOSABS 144 10/31/2016 1202   CMP     Component Value Date/Time   NA 140 10/31/2016 1202   K 2.8 (L) 10/31/2016 1202   CL 101 10/31/2016 1202   CO2 27 10/31/2016 1202   GLUCOSE 83 10/31/2016 1202   BUN 6 (L) 10/31/2016 1202   CREATININE 0.80 10/31/2016 1202   CALCIUM 9.2 10/31/2016 1202   PROT 6.2 10/31/2016 1202   ALBUMIN 3.7 10/31/2016 1202   AST 21 10/31/2016 1202   ALT 20 10/31/2016 1202   ALKPHOS 88 10/31/2016 1202   BILITOT 0.4 10/31/2016 1202   GFRNONAA 83 10/31/2016 1202   GFRAA >89 10/31/2016 1202   TB Gold: negative (10/31/16) Hepatitis panel:  Hepatitis C antibody reactive (12/28/15); hepatitis C RNA Quantitative not detected (08/02/16) Hepatitis B Surface Antigen and Core Antibody negative (12/28/15) Hepatitis A antibody negative (12/28/15) HIV: negative (12/28/15)   Assessment/Plan:  Counseled patient that Orencia is a selective T-cell costimulation blocker indicated for rheumatoid arthritis.  Counseled patient on purpose, proper use, and adverse effects of Orencia. The most common adverse effects are increased risk of infections, headache, and injection site reactions.  There is the possibility  of an increased risk of malignancy but it is not well understood if this increased risk is due to the medication or the disease state.  Provided patient with medication education material and answered all questions.  Patient consented to [facility]  Will upload consent into patient's chart.  Will apply for Orencia through patient's insurance.  Reviewed storage information for Orencia.    Patient just took Cimzia dose today.  Advised we will have to wait one month after today before Orencia can be started.  Advised initial injection must be administered in office.  Patient voiced understanding.  Will call her once Orencia PA is processed by her insurance.    Lilla Shook, Pharm.D., BCPS Clinical Pharmacist Pager: [phone] Phone: [phone] 02/28/2017 12:10 PM

## 2017-02-28 NOTE — Telephone Encounter (Signed)
Patient no longer needs folic acid as she is not on MTX

## 2017-02-28 NOTE — Patient Instructions (Signed)
Standing Labs We placed an order today for your standing lab work.    Please come back and get your standing labs in 1 month then every 3 months  We have open lab Monday through Friday from 8:30-11:30 AM and 1:30-4 PM at the office of Dr. Arbutus Ped, PA.   The office is located at [address], [address], [place], [place] [address] No appointment is necessary.   Labs are drawn by [employer].  You may receive a bill from [facility] for your lab work. If you have any questions regarding directions or hours of operation,  please call [phone].    Abatacept solution for injection (subcutaneous or intravenous use) What is this medicine? ABATACEPT (a ba TA sept) is used to treat moderate to severe active rheumatoid arthritis or psoriatic arthritis in adults. This medicine is also used to treat juvenile idiopathic arthritis. This medicine may be used for other purposes; ask your health care provider or pharmacist if you have questions. COMMON BRAND NAME(S): Orencia What should I tell my health care provider before I take this medicine? They need to know if you have any of these conditions: -are taking other medicines to treat rheumatoid arthritis -COPD -diabetes -infection or history of infections -recently received or scheduled to receive a vaccine -scheduled to have surgery -tuberculosis, a positive skin test for tuberculosis or have recently been in close contact with someone who has tuberculosis -viral hepatitis -an unusual or allergic reaction to abatacept, other medicines, foods, dyes, or preservatives -pregnant or trying to get pregnant -breast-feeding How should I use this medicine? This medicine is for infusion into a vein or for injection under the skin. Infusions are given by a health care professional in a hospital or clinic setting. If you are to give your own medicine at home, you will be taught how to prepare and give this medicine under the skin. Use  exactly as directed. Take your medicine at regular intervals. Do not take your medicine more often than directed. It is important that you put your used needles and syringes in a special sharps container. Do not put them in a trash can. If you do not have a sharps container, call your pharmacist or healthcare provider to get one. Talk to your pediatrician regarding the use of this medicine in children. While infusions in a clinic may be prescribed for children as young as 2 years for selected conditions, precautions do apply. Overdosage: If you think you have taken too much of this medicine contact a poison control center or emergency room at once. NOTE: This medicine is only for you. Do not share this medicine with others. What if I miss a dose? This medicine is used once a week if given by injection under the skin. If you miss a dose, take it as soon as you can. If it is almost time for your next dose, take only that dose. Do not take double or extra doses. If you are to be given an infusion, it is important not to miss your dose. Doses are usually every 4 weeks. Call your doctor or health care professional if you are unable to keep an appointment. What may interact with this medicine? Do not take this medicine with any of the following medications: -adalimumab -anakinra -certolizumab -etanercept -golimumab -infliximab -live virus vaccines -rituximab -tocilizumab This medicine may also interact with the following medications: -vaccines This list may not describe all possible interactions. Give your health care provider a list of all the medicines,  herbs, non-prescription drugs, or dietary supplements you use. Also tell them if you smoke, drink alcohol, or use illegal drugs. Some items may interact with your medicine. What should I watch for while using this medicine? Visit your doctor for regular check ups while you are taking this medicine. Tell your doctor or healthcare professional if  your symptoms do not start to get better or if they get worse. Call your doctor or health care professional if you get a cold or other infection while receiving this medicine. Do not treat yourself. This medicine may decrease your body's ability to fight infection. Try to avoid being around people who are sick. What side effects may I notice from receiving this medicine? Side effects that you should report to your doctor or health care professional as soon as possible: -allergic reactions like skin rash, itching or hives, swelling of the face, lips, or tongue -breathing problems -chest pain -signs of infection - fever or chills, cough, unusual tiredness, pain or trouble passing urine, or warm, red or painful skin Side effects that usually do not require medical attention (report to your doctor or health care professional if they continue or are bothersome): -dizziness -headache -nausea, vomiting -sore throat -stomach upset This list may not describe all possible side effects. Call your doctor for medical advice about side effects. You may report side effects to FDA at 1-800-FDA-1088. Where should I keep my medicine? Infusions will be given in a hospital or clinic and will not be stored at home. Storage for syringes given under the skin and stored at home: Keep out of the reach of children. Store in a refrigerator between 2 and 8 degrees C (36 and 46 degrees F). Keep this medicine in the original container. Protect from light. Do not freeze. Throw away any unused medicine after the expiration date. NOTE: This sheet is a summary. It may not cover all possible information. If you have questions about this medicine, talk to your doctor, pharmacist, or health care provider.  2018 Elsevier/Gold Standard (2016-03-08 10:07:35)

## 2017-03-01 ENCOUNTER — Telehealth: Payer: Self-pay

## 2017-03-01 LAB — COMPLETE METABOLIC PANEL WITH GFR
ALT: 26 U/L (ref 6–29)
AST: 25 U/L (ref 10–35)
Albumin: 4.2 g/dL (ref 3.6–5.1)
Alkaline Phosphatase: 102 U/L (ref 33–130)
BUN: 9 mg/dL (ref 7–25)
CALCIUM: 9.4 mg/dL (ref 8.6–10.4)
CHLORIDE: 103 mmol/L (ref 98–110)
CO2: 24 mmol/L (ref 20–31)
Creat: 0.87 mg/dL (ref 0.50–1.05)
GFR, EST AFRICAN AMERICAN: 86 mL/min (ref >=60)
GFR, Est Non African American: 75 mL/min (ref >=60)
Glucose, Bld: 75 mg/dL (ref 65–99)
POTASSIUM: 4.3 mmol/L (ref 3.5–5.3)
Sodium: 140 mmol/L (ref 135–146)
Total Bilirubin: 0.3 mg/dL (ref 0.2–1.2)
Total Protein: 6.7 g/dL (ref 6.1–8.1)

## 2017-03-01 NOTE — Telephone Encounter (Signed)
A prior authorization for Orencia [employer] was submitted to insurance via cover my meds. Will update once we receive a response.  Raguel Kosloski, [place], CPhT 12:14 PM

## 2017-03-04 LAB — HEPATITIS C RNA QUANTITATIVE
HCV QUANT LOG: NOT DETECTED {Log_IU}/mL
HCV QUANT: NOT DETECTED [IU]/mL

## 2017-03-04 NOTE — Telephone Encounter (Signed)
Received a fax from [employer] regarding a prior authorization DENIAL for [employer]. Dub Amis is only approved if patient meets both of the following criteria:  1) if patient has a history of failure, contraindication or intolerance to one of the following preferred medicaitons: Humira, Simponi, and 2) patient has a history of failure, contraindication or intolerance to one of the following preferred medicaitons: Actemra and Harriette Ohara.   Reference number: [id number] Phone number:[phone]  Will send document to scan center.  Kamoni Gentles, [place], CPhT  1:05 PM

## 2017-03-04 NOTE — Progress Notes (Signed)
WNL

## 2017-03-08 ENCOUNTER — Telehealth: Payer: Self-pay | Admitting: Rheumatology

## 2017-03-08 NOTE — Telephone Encounter (Signed)
Patient has a question about her folic acid.

## 2017-03-11 ENCOUNTER — Telehealth: Payer: Self-pay

## 2017-03-11 IMAGING — CT CT HEAD WO/W CM
3 series · 17 of 30 positions shown, 19 images · IV contrast (omnipaque)
Comparison: MRI brain 05/20/2015. CT head without contrast
05/05/2015.

CLINICAL DATA: Seizure May 2015. Increased memory loss since
that episode.

EXAM:
CT HEAD WITHOUT AND WITH CONTRAST
TECHNIQUE: Contiguous axial images were obtained from the base of the skull
through the vertex without and with intravenous contrast
CONTRAST:  75mL OMNIPAQUE IOHEXOL 300 MG/ML  SOLN

[Series 2: head w/o 4.8 h37s · axial · non-contrast · 0.43mm/px · z∈[+182,+312]mm · 8 of 35 slices shown, 10 images]
[im 4/35  brain]
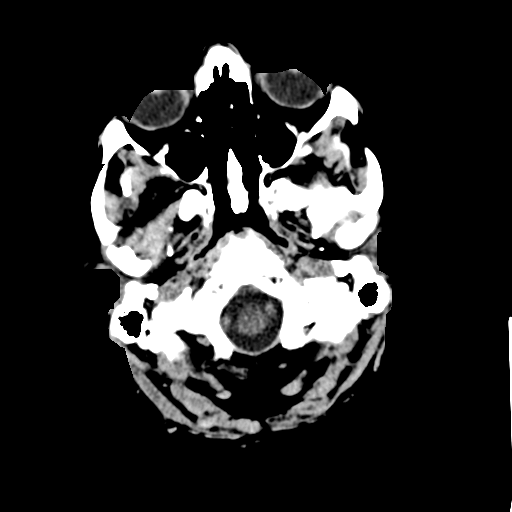
[im 4/35  bone]
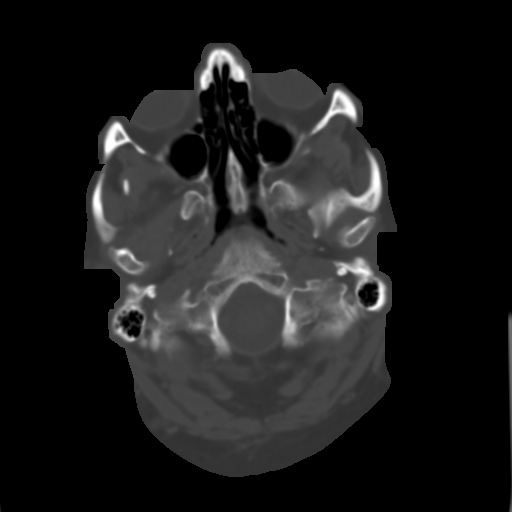
[im 8/35  brain]
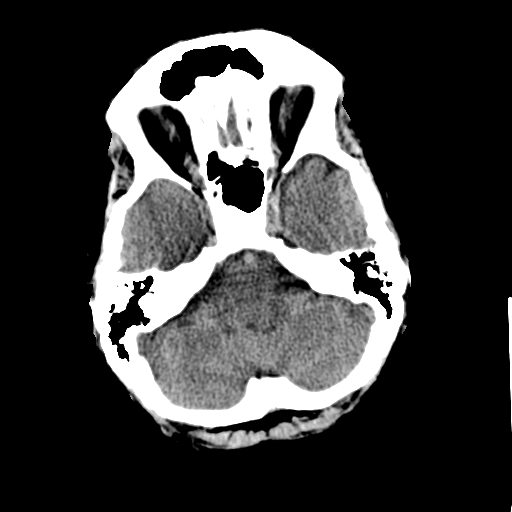
[im 12/35  brain]
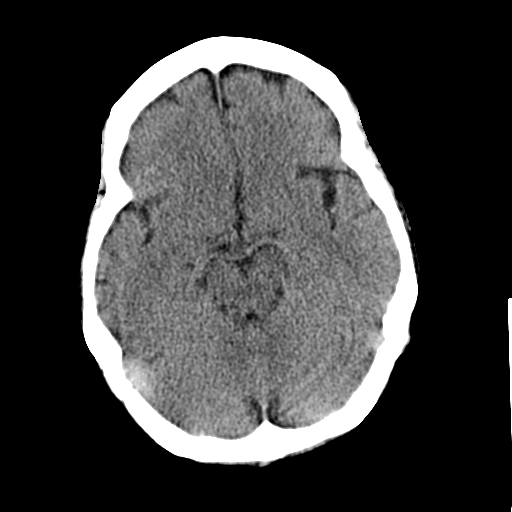
[im 16/35  brain]
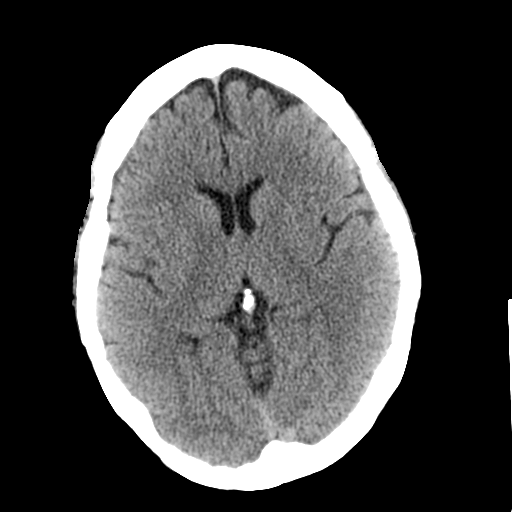
[im 19/35  brain]
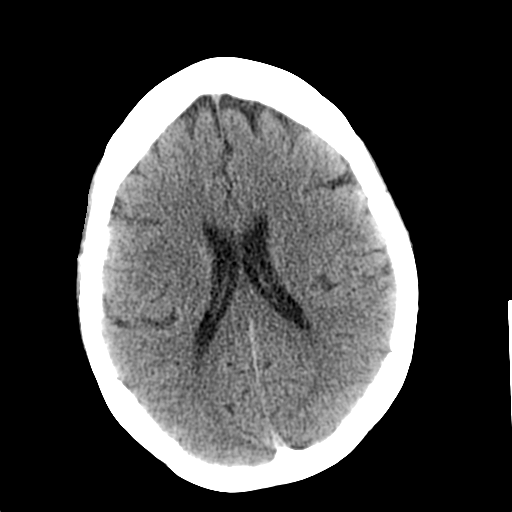
[im 19/35  bone]
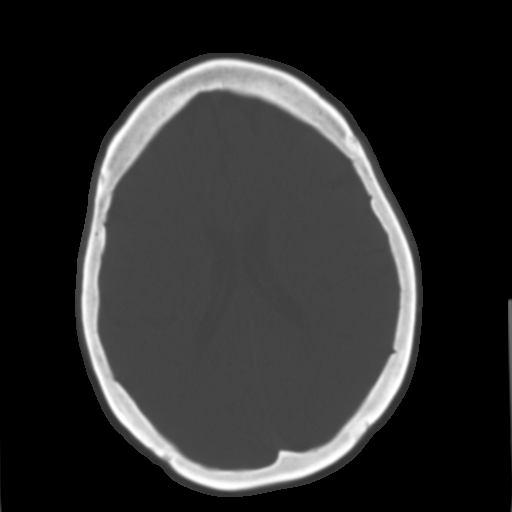
[im 23/35  brain]
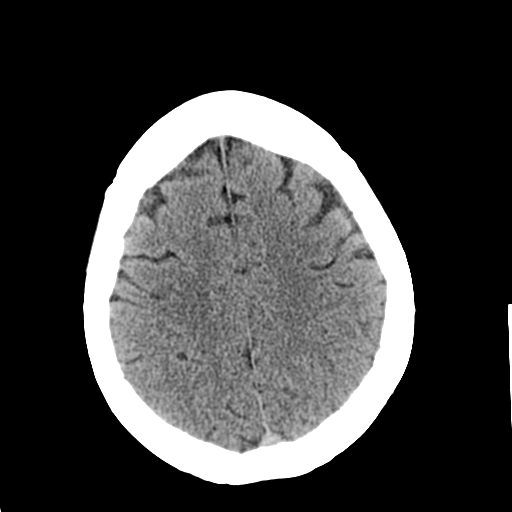
[im 27/35  brain]
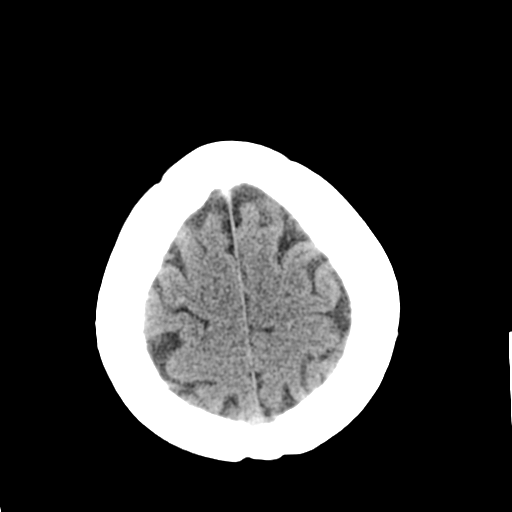
[im 31/35  brain]
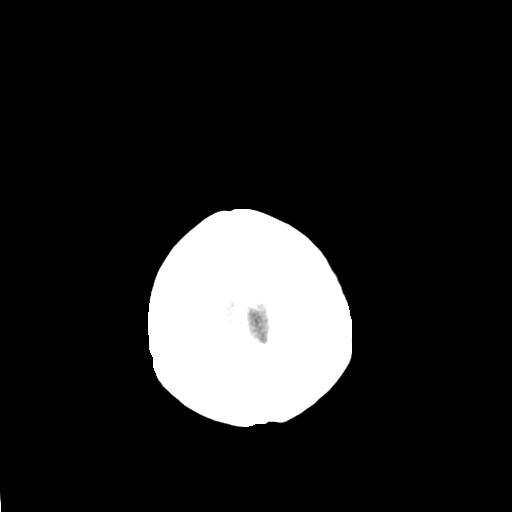

[Series 3: head w/cm 4.8 h40s · axial · 0.43mm/px · z∈[+182,+312]mm · 8 of 35 slices shown]
[im 4/35  brain]
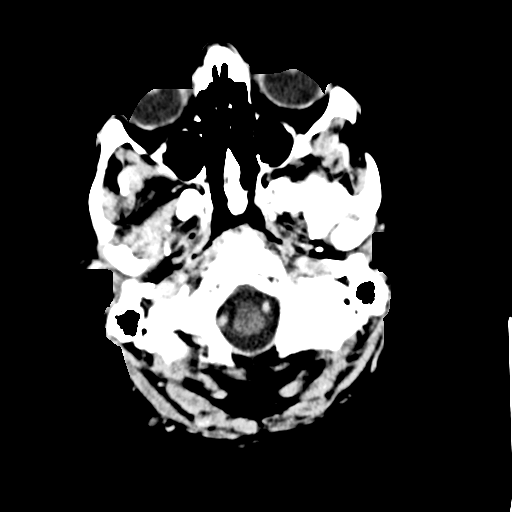
[im 8/35  brain]
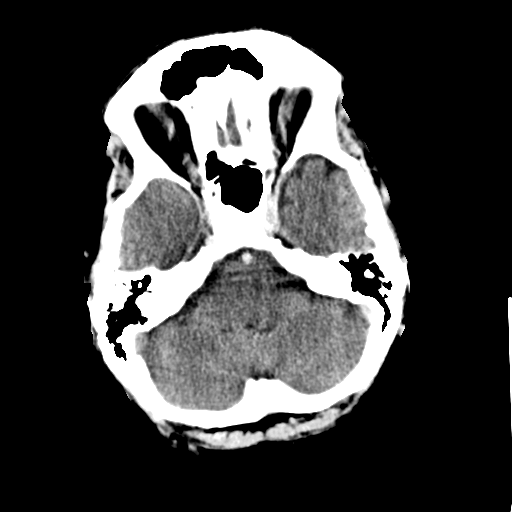
[im 12/35  brain]
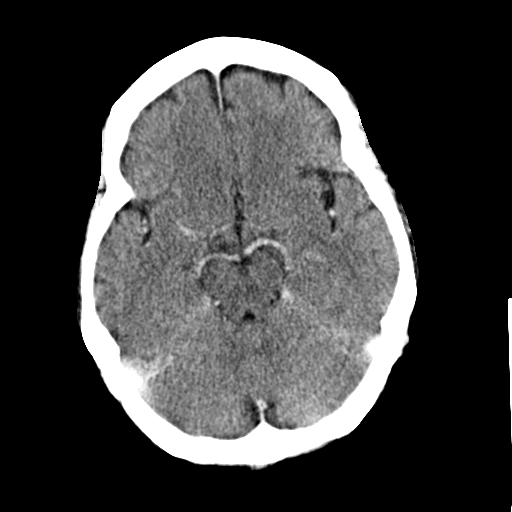
[im 16/35  brain]
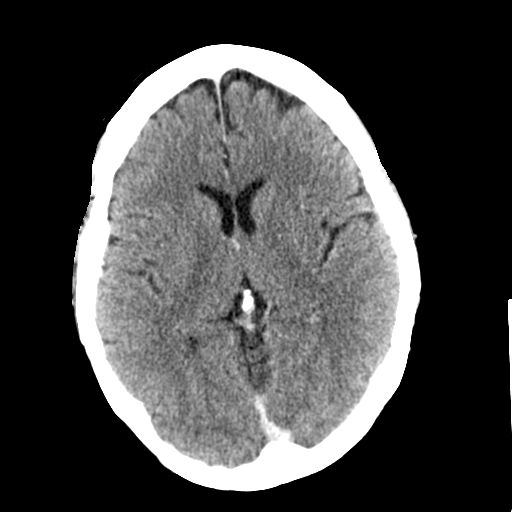
[im 19/35  brain]
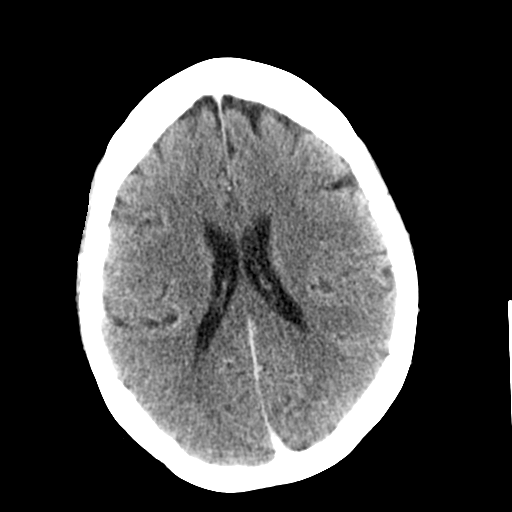
[im 23/35  brain]
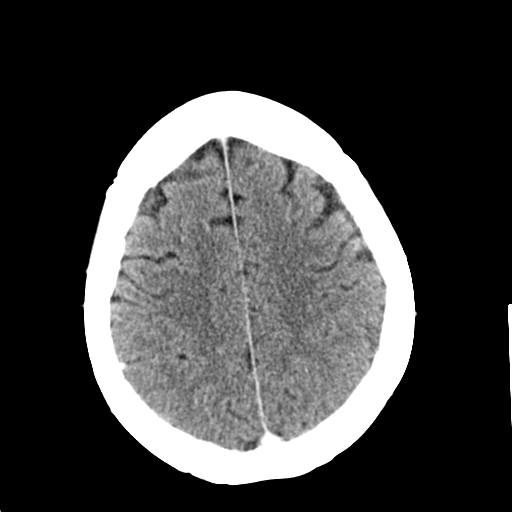
[im 27/35  brain]
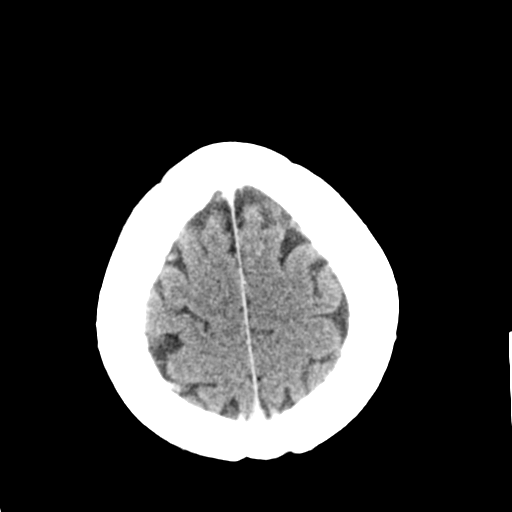
[im 31/35  brain]
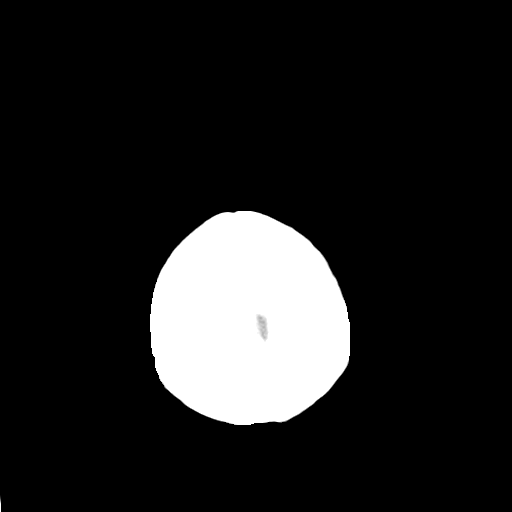

[Series 4: head w/o 4.8 h60s · axial · non-contrast · 0.43mm/px · 1 of 36 slices shown]
[im 4/36  brain]
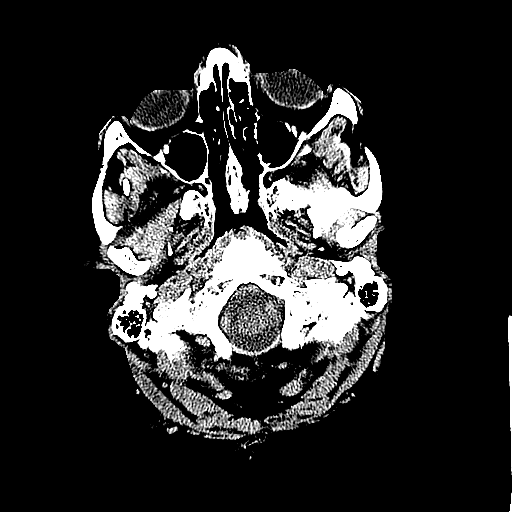

[17 of 30 positions shown; findings below may reference images not displayed]

FINDINGS: No acute cortical infarct, hemorrhage, or mass lesion is present.
The ventricles are of normal size. No significant extra-axial fluid
collection is evident. The paranasal sinuses and mastoid air cells
are clear. The calvarium is intact.

The postcontrast images demonstrate no pathologic enhancement. The
globes and orbits are intact. No significant extracranial soft
tissue lesion is present.
IMPRESSION: Negative CT of the head without and with contrast.

## 2017-03-11 NOTE — Telephone Encounter (Signed)
Plan Actemr sq

## 2017-03-11 NOTE — Telephone Encounter (Signed)
Patient advised she does not need to take the Folic acid because she is no longer on MTX. Patient verbalized understanding.

## 2017-03-11 NOTE — Telephone Encounter (Signed)
We are switching patient from Cimzia due to possible psoriasis.  We applied for Orencia but it was denied.  Actemra or Harriette Ohara is preferred through patient's insurance.  Patient has history of positive hepatitis C antibody.  She had negative hepatitis C RNA 02/28/17.  I do not see any history of diverticulitis.    Do you want me to appeal Orencia or apply for Actemra or Harriette Ohara?

## 2017-03-11 NOTE — Telephone Encounter (Signed)
A prior authorization for Actemra was submitted to patients insurance via cover my meds. Will update once we receive a response.   Phong Isenberg, [place], CPhT 12:16 PM

## 2017-03-11 NOTE — Telephone Encounter (Signed)
I called patient to discuss Actemra.  Counseled patient that Actemra is an IL-6 blocking agent.  Reviewed Actemra dose of 162 mg every other week for patients with a weight of <100 kg.  Counseled patient on purpose, proper use, and adverse effects of Actemra.  Reviewed the most common adverse effects including infections, injection site reaction.  Discussed the potential of bowel injury, and patient denies any history of diverticulitis.  Reviewed the importance of regular labs while on Actemra therapy including the need for baseline and routine lipid panel.  Patient agrees to apply for Actemra through insurance.  If approved, will need to schedule visit for consent and initial injection.  Patient will not be able to start Actemra until after 03/30/17 which is one month after her most recent Cimzia.    Advised that we will need a baseline lipid panel.  Patient confirms that her primary care provider monitors her cholesterol and recently did a lipid panel a few months ago.  I called patient's PCP office to get results from most recent lipid panel.  I spoke to Amy who confirms that patient had lipid panel in May.  She will fax results to our office.    Can you apply for Actemra through patient's insurance?    Lilla Shook, Pharm.D., BCPS, CPP Clinical Pharmacist Pager: [phone] Phone: [phone] 03/11/2017 10:54 AM

## 2017-03-12 NOTE — Telephone Encounter (Signed)
Received a fax from [employer]. It is a copy of the authorization with the phone number to [employer] department [phone]). I called to check the status. Spoke with Renea Ee who states that there was no record on file for the authorization. An Berkley Harvey was initiate over the phone. We should have a response between 5-7 days. We will be notified by fax and phone.  Will update once we receive a response.   Case ID: [id number]  Abran Duke, CPhT 9:37 AM

## 2017-03-14 NOTE — Telephone Encounter (Signed)
Received a fax from [employer] regarding a prior authorization DENIAL for Actemra because patient does not have a history of failure, contraindicaiton or intolerance to one of the following: (1) Humira or (2) Simponi   Reference number:[id number] Phone number:[phone]  Can we appeal this?   Will send document to scan center.  Travor Royce, [place], CPhT  4:33 PM

## 2017-03-15 ENCOUNTER — Encounter: Payer: Self-pay | Admitting: Pharmacist

## 2017-03-15 NOTE — Telephone Encounter (Signed)
Appeal letter was written for Actemra and faxed to [employer].  I updated patient on status and will continue to update her once we know status of appeal.    Lilla Shook, Pharm.D., BCPS, CPP Clinical Pharmacist Pager: [phone] Phone: [phone] 03/15/2017 8:25 AM

## 2017-03-20 ENCOUNTER — Telehealth: Payer: Self-pay | Admitting: Neurology

## 2017-03-20 ENCOUNTER — Institutional Professional Consult (permissible substitution): Payer: Commercial Managed Care - HMO | Admitting: Neurology

## 2017-03-20 NOTE — Telephone Encounter (Signed)
Pt had an appt on 01/31/17 pt left without being see. Pt was rescheduled on 02/13/17. Pt called on 02/12/17 and cancelled the appt for 02/13/17. Pt was rescheduled for 03/20/17. Pt called and cancelled without giving a 24 hour notice.

## 2017-03-20 NOTE — Telephone Encounter (Signed)
Please follow dismissal protocol as per our No Show Policy. This is for New patient for sleep consult only. Patient sees Darrol Angel and Dr. Pearlean Brownie.

## 2017-03-21 NOTE — Telephone Encounter (Signed)
Received a fax from [facility] regarding a prior authorization approval for Actemra from 03/20/2017 to 03/15/2018.   Reference number:[id number] Phone number:[phone]  Will send document to scan center.  Braxton Weisbecker, [place], CPhT  9:49 AM

## 2017-03-22 NOTE — Telephone Encounter (Signed)
I informed patient of Actemra approval.  Scheduled nurse visit for initial Actemra for 04/02/17 at 10:00.  This will be at least one month after her most recent Cimzia.  Will plan to start patient with Actemra sample and will obtain consent before starting Actemra.  Patient denies any further questions or concerns at this time.   Lilla Shook, Pharm.D., BCPS, CPP Clinical Pharmacist Pager: [phone] Phone: [phone] 03/22/2017 1:13 PM

## 2017-03-26 ENCOUNTER — Encounter: Payer: Self-pay | Admitting: Neurology

## 2017-04-02 ENCOUNTER — Ambulatory Visit (INDEPENDENT_AMBULATORY_CARE_PROVIDER_SITE_OTHER): Payer: 59 | Admitting: *Deleted

## 2017-04-02 VITALS — BP 134/73 | HR 75

## 2017-04-02 DIAGNOSIS — M0579 Rheumatoid arthritis with rheumatoid factor of multiple sites without organ or systems involvement: Secondary | ICD-10-CM

## 2017-04-02 DIAGNOSIS — Z79899 Other long term (current) drug therapy: Secondary | ICD-10-CM | POA: Diagnosis not present

## 2017-04-02 MED ORDER — PREDNISONE 5 MG PO TABS: ORAL_TABLET | ORAL | 0 refills | Status: DC

## 2017-04-02 MED ORDER — TOCILIZUMAB 162 MG/0.9ML ~~LOC~~ SOSY
162.0000 mg | PREFILLED_SYRINGE | Freq: Once | SUBCUTANEOUS | Status: AC
  Administered 2017-04-02: 162 mg via SUBCUTANEOUS

## 2017-04-02 MED ORDER — TOCILIZUMAB 162 MG/0.9ML ~~LOC~~ SOSY
162.0000 mg | PREFILLED_SYRINGE | SUBCUTANEOUS | 0 refills | Status: DC
Start: 2017-04-02 — End: 2017-04-25

## 2017-04-02 NOTE — Progress Notes (Signed)
Patient in office for a new start to Actemra. Patient was provided with a sample. Patient was given injection in her right thigh. Patient tolerated injection well. Patient monitored in office for 30 minutes after administration for adverse reactions. No adverse reactions noted.   Administrations This Visit    Tocilizumab SOSY 162 mg    Admin Date 04/02/2017 Action Given Dose 162 mg Route Subcutaneous Administered By Henriette Combs, LPN

## 2017-04-02 NOTE — Progress Notes (Signed)
Pharmacy Note Subjective: Patient presents today to the [facility] for initiation of Actemra.   Patient seen by the pharmacist for counseling on Actemra.    Objective: TB Gold: negative (10/31/16) Hepatitis panel:  Hepatitis C antibody reactive (12/28/15); hepatitis C RNA Quantitative not detected (02/28/17) Hepatitis B Surface Antigen and Core Antibody negative (12/28/15) Hepatitis A antibody negative (12/28/15) HIV: negative (12/28/15) Lipid panel: total cholesterol 203, LDL 126, HDL 40, triglycerides 185 (01/11/2017)  CBC    Component Value Date/Time   WBC 6.0 02/28/2017 1220   RBC 4.81 02/28/2017 1220   HGB 13.9 02/28/2017 1220   HCT 42.4 02/28/2017 1220   PLT 216 02/28/2017 1220   MCV 88.1 02/28/2017 1220   MCH 28.9 02/28/2017 1220   MCHC 32.8 02/28/2017 1220   RDW 13.4 02/28/2017 1220   LYMPHSABS 2,040 02/28/2017 1220   MONOABS 720 02/28/2017 1220   EOSABS 360 02/28/2017 1220   BASOSABS 120 02/28/2017 1220   CMP     Component Value Date/Time   NA 140 02/28/2017 1220   K 4.3 02/28/2017 1220   CL 103 02/28/2017 1220   CO2 24 02/28/2017 1220   GLUCOSE 75 02/28/2017 1220   BUN 9 02/28/2017 1220   CREATININE 0.87 02/28/2017 1220   CALCIUM 9.4 02/28/2017 1220   PROT 6.7 02/28/2017 1220   ALBUMIN 4.2 02/28/2017 1220   AST 25 02/28/2017 1220   ALT 26 02/28/2017 1220   ALKPHOS 102 02/28/2017 1220   BILITOT 0.3 02/28/2017 1220   GFRNONAA 75 02/28/2017 1220   GFRAA 86 02/28/2017 1220   Assessment/Plan:  Counseled patient that Actemra is an IL-6 blocking agent.  Reviewed Actemra dose of 162 mg every other week for patients with a weight of <100 kg.  Counseled patient on purpose, proper use, and adverse effects of Actemra.  Reviewed the most common adverse effects including infections, injection site reaction, bowel injury, and rarely cancer and conditions of the nervous system.  Reviewed that the medication should be held during infections.  Discussed that there  is the possibility of an increased risk of malignancy but it is not well understood if this increased risk is due to the medication or the disease state.  Reviewed the importance of regular labs while on Actemra therapy including the need for routine lipid panel.  Will send a message to patient's PCP regarding Actemra's possible effect on cholesterol and need to monitor lipid panel and treat if appropriate.  Counseled patient that Actemra should be held prior to scheduled surgery.  Counseled patient to avoid live vaccines while on Actemra.  Advised patient to get annual influenza vaccine and the pneumococcal vaccine.  Provided patient with medication education material and answered all questions.  Patient voiced understanding.  Patient consented to Actemra.  Will upload consent into the media tab.  Reviewed storage instructions of Actemra with patient.   Patient was counseled on how to administer subcutaneous Actemra.  Patient received her first dose of Actemra in the right thigh.  Lot: [id number], Exp. 02/2018.  Patient was monitored for 30 minutes post injection.  No injection site reaction noted.  Patient will need standing lab orders in one month including fasting lipid panel.  Provided patient with standing lab instructions and placed standing lab order.  Patient prefers to get labs drawn at her primary care provider office.  Advised she can have labs drawn there as long as we receive the results.    Patient requested something for pain.  Discussed with Dr.  Deveshwar who agreed to prescribed prednisone taper (20 mg for 2 days, 15 mg for 2 days, 10 mg for 2 days, 5 mg for 2 days, 2.5 mg for 2 days).  Counseled patient on purpose, proper use, and adverse effects of prednisone including importance of monitoring blood pressure.    Patient scheduled for follow up on 05/31/17.    Lilla Shook, Pharm.D., BCPS, CPP Clinical Pharmacist Pager: [phone] Phone: [phone] 04/02/2017 10:09 AM

## 2017-04-02 NOTE — Patient Instructions (Addendum)
Standing Labs We placed an order today for your standing lab work.    Please come back and get your standing labs in 1 month then every 3 months.  Your labs in one month will include a lipid panel.  You will need to be fasting for this.   We have open lab Monday through Friday from 8:30-11:30 AM and 1:30-4 PM at the office of Dr. Pollyann Savoy.   The office is located at [address], [address], [place], [place] [address] No appointment is necessary.   Labs are drawn by [facility].  You may receive a bill from [facility] for your lab work. If you have any questions regarding directions or hours of operation,  please call [phone].    Tocilizumab injection What is this medicine? TOCILIZUMAB (TOE si LIZ ue mab) is a monoclonal antibody. It is used to treat rheumatoid arthritis, juvenile idiopathic arthritis, giant cell arteritis, and cytokine release syndrome due to chimeric antigen receptor (CAR) T-cell treatment. This medicine may be used for other purposes; ask your health care provider or pharmacist if you have questions. COMMON BRAND NAME(S): Actemra What should I tell my health care provider before I take this medicine? They need to know if you have any of these conditions: -cancer -diabetes -diverticulitis -heart disease -hepatitis B or history of hepatitis B infection -high blood pressure -high cholesterol -history of pancreatitis -immune system problems -infection (especially a virus infection such as chickenpox, cold sores, or herpes) -liver disease -low blood counts, like low white cell, platelet, or red cell counts -multiple sclerosis -recently received or scheduled to receive a vaccine -scheduled to have surgery -stomach ulcer -stroke -tuberculosis, a positive skin test for tuberculosis or have recently been in close contact with someone who has tuberculosis -an unusual or allergic reaction to tocilizumab, other medicines, foods, dyes, or preservatives -pregnant or  trying to get pregnant -breast-feeding How should I use this medicine? This medicine is for infusion into a vein or for injection under the skin. It is usually given by a health care professional in a hospital or clinic setting. If you get this medicine at home, you will be taught how to prepare and give this medicine. Use exactly as directed. Take your medicine at regular intervals. Do not take your medicine more often than directed. It is important that you put your used needles and syringes in a special sharps container. Do not put them in a trash can. If you do not have a sharps container, call your pharmacist or healthcare provider to get one. A special MedGuide will be given to you by the pharmacist with each prescription and refill. Be sure to read this information carefully each time. Talk to your pediatrician regarding the use of this medicine in children. While the drug may be prescribed for children as young as 2 years for selected conditions, precautions do apply. Overdosage: If you think you have taken too much of this medicine contact a poison control center or emergency room at once. NOTE: This medicine is only for you. Do not share this medicine with others. What if I miss a dose? It is important not to miss your dose. Call your doctor or health care professional if you are unable to keep an appointment. If you give yourself the medicine and you miss a dose, take it as soon as you can. If it is almost time for your next dose, take only that dose. Do not take double or extra doses. What may interact with this  medicine? Do not take this medicine with any of the following medications: -live virus vaccines This medicine may also interact with the following medications: -abatacept -adalimumab -anakinra -atorvastatin -certolizumab -cyclosporine -dextromethorphan -etanercept -golimumab -infliximab -lovastatin -medicines that lower the immune system -ofatumumab -omeprazole -oral  contraceptives -rituximab -simvastatin -steroid medicines like prednisone or cortisone -theophylline -tositumomab -vaccines -warfarin This list may not describe all possible interactions. Give your health care provider a list of all the medicines, herbs, non-prescription drugs, or dietary supplements you use. Also tell them if you smoke, drink alcohol, or use illegal drugs. Some items may interact with your medicine. What should I watch for while using this medicine? Tell your doctor or healthcare professional if your symptoms do not start to get better or if they get worse. Your condition will be monitored carefully while you are receiving this medicine. You will be tested for tuberculosis (TB) before you start this medicine. If your doctor prescribes any medicine for TB, you should start taking the TB medicine before starting this medicine. Make sure to finish the full course of TB medicine. Talk to your doctor about your risk of cancer. You may be more at risk for certain types of cancers if you take this medicine. Call your doctor or health care professional for advice if you get a fever, chills or sore throat, or other symptoms of a cold or flu. Do not treat yourself. This drug decreases your body's ability to fight infections. Try to avoid being around people who are sick. You may need blood work done while you are taking this medicine. What side effects may I notice from receiving this medicine? Side effects that you should report to your doctor or health care professional as soon as possible: -allergic reactions like skin rash, itching or hives, swelling of the face, lips, or tongue -breathing problems -feeling faint or lightheaded, falls -fever or chills, sore throat -stomach pain -unusual bleeding or bruising -yellowing of the eyes or skin Side effects that usually do not require medical attention (report to your doctor or health care professional if they continue or are  bothersome): -dizziness -headache -pain, redness, or irritation at site where injected This list may not describe all possible side effects. Call your doctor for medical advice about side effects. You may report side effects to FDA at 1-800-FDA-1088. Where should I keep my medicine? Keep out of the reach of children. If you are using this medicine at home, you will be instructed on how to store this medicine. Throw away any unused medicine after the expiration date on the label. NOTE: This sheet is a summary. It may not cover all possible information. If you have questions about this medicine, talk to your doctor, pharmacist, or health care provider.  2018 Elsevier/Gold Standard (2016-05-08 12:55:30)

## 2017-04-14 ENCOUNTER — Other Ambulatory Visit: Payer: Self-pay | Admitting: Rheumatology

## 2017-04-15 NOTE — Telephone Encounter (Signed)
Last Visit: 02/28/17 Next Visit: 05/31/17 Labs: 02/28/17 WNL  Okay to refill per Dr. Corliss Skains

## 2017-04-24 ENCOUNTER — Telehealth: Payer: Self-pay | Admitting: Rheumatology

## 2017-04-24 NOTE — Telephone Encounter (Signed)
Patient is experiencing left hip pain and leg pain. Patient states she has never hurt there before. She is requesting rx of prednisone to be sent to [facility] in [place].

## 2017-04-24 NOTE — Progress Notes (Signed)
   Procedure Note  Patient: Christina Jenkins             Date of Birth: 05-03-1961           MRN: [id number]             Visit Date: 04/29/2017  Procedures: Visit Diagnoses: Left hip pain and how long it's been hurting.   History of sero positive rheumatoid arthritis. She states her left hip has been hurting for the last 2 weeks now. She has difficulty walking and sitting for prolonged time.. Difficulty sleeping on the side.    High risk medication use - Actemra 162 mg sq q 2weeks, Arava 20 mg by mouth daily, prednisone(MTX- oral ulcers, Cimzia-Psoriasis) - Plan: CBC with Differential/Platelet, COMPLETE METABOLIC PANEL WITH GFR, Lipid panel, CBC with Differential/Platelet, COMPLETE METABOLIC PANEL WITH GFR, Lipid Profile, CBC with Differential/Platelet  History of hepatitis C - Hepatitis C RNA quantitative will be checked every 6 months interval. - Plan: Lipid Profile  High risk medication use - Plan: CBC with Differential/Platelet, COMPLETE METABOLIC PANEL WITH GFR, Lipid panel, CBC with Differential/Platelet, COMPLETE METABOLIC PANEL WITH GFR, Lipid Profile, CBC with Differential/Platelet  Screening for hyperlipidemia - Plan: Lipid Profile  High risk medication use - unable to tolerate Methotrexate, nausea fatigueArava and Cimzia - Plan: CBC with Differential/Platelet, COMPLETE METABOLIC PANEL WITH GFR, Lipid panel, CBC with Differential/Platelet, COMPLETE METABOLIC PANEL WITH GFR, Lipid Profile, CBC with Differential/Platelet Large Joint Inj Date/Time: 04/29/2017 10:25 AM Performed by: Pollyann Savoy Authorized by: Pollyann Savoy   Consent Given by:  Patient Site marked: the procedure site was marked   Timeout: prior to procedure the correct patient, procedure, and site was verified   Indications:  Pain Location:  Hip Site:  L greater trochanter Prep: patient was prepped and draped in usual sterile fashion   Needle Size:  27 G Needle Length:  1.5 inches Approach:   Lateral Ultrasound Guidance: No   Fluoroscopic Guidance: No   Arthrogram: No   Medications:  40 mg triamcinolone acetonide 40 MG/ML; 1.5 mL lidocaine 1 % Aspiration Attempted: No   Aspirate amount (mL):  0 Patient tolerance:  Patient tolerated the procedure well with no immediate complications   Pollyann Savoy, MD

## 2017-04-24 NOTE — Telephone Encounter (Signed)
Patient has been scheduled for an appointment on 04/29/17.

## 2017-04-24 NOTE — Telephone Encounter (Signed)
Patient states she is experiencing pain in her left hip for the last 3-4 days. Patient states the pain has gotten worse. Patient states it is a throbbing pain and hurts with activity such as stepping into her car. Patient denies any injury or fall. Patient states all of the pain is located all in that left hip. Patient is currently on Actemra and [place]. Patient is taking medications as prescribed.  Please advise.

## 2017-04-24 NOTE — Telephone Encounter (Signed)
Need appointment

## 2017-04-25 ENCOUNTER — Other Ambulatory Visit: Payer: Self-pay | Admitting: Rheumatology

## 2017-04-26 NOTE — Telephone Encounter (Signed)
Last Visit: 02/28/17 Next Visit: 05/31/17 Labs: 02/28/17 WNL TB Gold: 10/31/2016 Neg  Okay to refill per Dr. Corliss Skains

## 2017-04-29 ENCOUNTER — Encounter: Payer: Self-pay | Admitting: Rheumatology

## 2017-04-29 ENCOUNTER — Ambulatory Visit (INDEPENDENT_AMBULATORY_CARE_PROVIDER_SITE_OTHER): Payer: 59 | Admitting: Rheumatology

## 2017-04-29 ENCOUNTER — Telehealth: Payer: Self-pay | Admitting: Rheumatology

## 2017-04-29 VITALS — BP 132/72 | HR 80

## 2017-04-29 DIAGNOSIS — M7062 Trochanteric bursitis, left hip: Secondary | ICD-10-CM

## 2017-04-29 DIAGNOSIS — Z8619 Personal history of other infectious and parasitic diseases: Secondary | ICD-10-CM

## 2017-04-29 DIAGNOSIS — Z13220 Encounter for screening for lipoid disorders: Secondary | ICD-10-CM

## 2017-04-29 DIAGNOSIS — Z79899 Other long term (current) drug therapy: Secondary | ICD-10-CM

## 2017-04-29 LAB — CBC WITH DIFFERENTIAL/PLATELET
BASOS PCT: 2 %
Basophils Absolute: 82 cells/uL (ref 0–200)
Eosinophils Absolute: 205 cells/uL (ref 15–500)
Eosinophils Relative: 5 %
HCT: 43.7 % (ref 35.0–45.0)
Hemoglobin: 14.9 g/dL (ref 11.7–15.5)
LYMPHS PCT: 39 %
Lymphs Abs: 1599 cells/uL (ref 850–3900)
MCH: 30.1 pg (ref 27.0–33.0)
MCHC: 34.1 g/dL (ref 32.0–36.0)
MCV: 88.3 fL (ref 80.0–100.0)
MONOS PCT: 9 %
MPV: 10.7 fL (ref 7.5–12.5)
Monocytes Absolute: 369 cells/uL (ref 200–950)
NEUTROS ABS: 1845 {cells}/uL (ref 1500–7800)
Neutrophils Relative %: 45 %
Platelets: 165 10*3/uL (ref 140–400)
RBC: 4.95 MIL/uL (ref 3.80–5.10)
RDW: 13.3 % (ref 11.0–15.0)
WBC: 4.1 10*3/uL (ref 3.8–10.8)

## 2017-04-29 MED ORDER — TRIAMCINOLONE ACETONIDE 40 MG/ML IJ SUSP
40.0000 mg | INTRAMUSCULAR | Status: AC | PRN
  Administered 2017-04-29: 40 mg via INTRA_ARTICULAR

## 2017-04-29 MED ORDER — LIDOCAINE HCL 1 % IJ SOLN
1.5000 mL | INTRAMUSCULAR | Status: AC | PRN
  Administered 2017-04-29: 1.5 mL

## 2017-04-29 NOTE — Telephone Encounter (Signed)
Patient advised it is okay for her to have the new shingles vaccine as it is not a live vaccine.

## 2017-04-29 NOTE — Telephone Encounter (Signed)
Patient wants to know if it is okay for her to get the new Shingles vaccine. Please call to advise

## 2017-04-30 DIAGNOSIS — E782 Mixed hyperlipidemia: Secondary | ICD-10-CM | POA: Insufficient documentation

## 2017-04-30 LAB — COMPLETE METABOLIC PANEL WITH GFR
ALT: 24 U/L (ref 6–29)
AST: 19 U/L (ref 10–35)
Albumin: 4.2 g/dL (ref 3.6–5.1)
Alkaline Phosphatase: 69 U/L (ref 33–130)
BUN: 11 mg/dL (ref 7–25)
CALCIUM: 9.4 mg/dL (ref 8.6–10.4)
CHLORIDE: 105 mmol/L (ref 98–110)
CO2: 28 mmol/L (ref 20–32)
CREATININE: 0.99 mg/dL (ref 0.50–1.05)
GFR, Est African American: 74 mL/min (ref >=60)
GFR, Est Non African American: 64 mL/min (ref >=60)
GLUCOSE: 86 mg/dL (ref 65–99)
POTASSIUM: 3.8 mmol/L (ref 3.5–5.3)
SODIUM: 143 mmol/L (ref 135–146)
Total Bilirubin: 0.5 mg/dL (ref 0.2–1.2)
Total Protein: 6.3 g/dL (ref 6.1–8.1)

## 2017-04-30 LAB — LIPID PANEL
CHOL/HDL RATIO: 4.5 ratio (ref <5.0)
Cholesterol: 210 mg/dL — ABNORMAL HIGH (ref <200)
HDL: 47 mg/dL — ABNORMAL LOW (ref >50)
LDL CALC: 139 mg/dL — AB (ref <100)
Triglycerides: 121 mg/dL (ref <150)
VLDL: 24 mg/dL (ref <30)

## 2017-04-30 NOTE — Progress Notes (Signed)
Labs are stable. LDL is still high. Please forward the copy to her PCP.

## 2017-05-02 ENCOUNTER — Telehealth: Payer: Self-pay | Admitting: Rheumatology

## 2017-05-02 MED ORDER — PREDNISONE 5 MG PO TABS: ORAL_TABLET | ORAL | 0 refills | Status: DC

## 2017-05-02 NOTE — Telephone Encounter (Signed)
Patient states she was in office for a cortisone injection 04/29/17. Patient states she has had no relief. Patient states the pain is still just as bad as before the injection. Patient is requesting a prescription Prednisone or something to help her with the pain in her hip. Patient states she saw her PCP on Tuesday and had a chest x-ray. She is now being sent for a CT-Scan tomorrow because she may have seen something.  Please advise.

## 2017-05-02 NOTE — Addendum Note (Signed)
Addended by: Henriette Combs on: 05/02/2017 03:37 PM   Modules accepted: Orders

## 2017-05-02 NOTE — Telephone Encounter (Signed)
Patient called in reference to cortisone injection in hip. Patient has had no relief, and states  may have gotten worse since injection. Patient would like to know if you could call in an rx for Prednisone, or is there something else she can do to help with pain. Patient uses [facility] in [place]. Please call patient to advise.

## 2017-05-02 NOTE — Telephone Encounter (Signed)
Pred 5mg  4x2d, 3x2d, 2x2d, 1x2d, monitor BP

## 2017-05-02 NOTE — Telephone Encounter (Signed)
Patient advised prescription sent to the pharmacy and to monitor her blood pressure. Patient verbalized understanding.

## 2017-05-21 NOTE — Progress Notes (Signed)
Office Visit Note  Patient: Christina Jenkins             Date of Birth: 06/19/1961           MRN: [id number]             PCP: Encarnacion Slates, PA-C Referring: Encarnacion Slates, PA-C Visit Date: 05/31/2017 Occupation: @GUAROCC @    Subjective:  Pain of the Right Hip; Pain of the Left Hip; and Medication Management   History of Present Illness: Christina Jenkins is a 56 y.o. female with history of sero positive rheumatoid arthritis. She states she's been having some pain and stiffness in her hands. She also has pain in her bilateral hips. She denies any joint swelling. She continues to have some sicca symptoms.  Activities of Daily Living:  Patient reports morning stiffness for 30 minutes.   Patient Reports nocturnal pain. hips Difficulty dressing/grooming: Denies Difficulty climbing stairs: Reports Difficulty getting out of chair: Reports Difficulty using hands for taps, buttons, cutlery, and/or writing: Reports   Review of Systems  Constitutional: Positive for weight loss. Negative for fatigue, night sweats, weight gain and weakness.       Has had 10 lbs of intentional weight loss  HENT: Negative.  Negative for mouth sores, trouble swallowing, trouble swallowing, mouth dryness and nose dryness.   Eyes: Positive for visual disturbance. Negative for pain, redness and dryness.       Right eye watering/ has seen eye doctor   Respiratory: Positive for cough. Negative for shortness of breath and difficulty breathing.        Recent diagnosis of COPD with her primary care  Cardiovascular: Negative.  Negative for chest pain, palpitations, hypertension, irregular heartbeat and swelling in legs/feet.  Gastrointestinal: Negative.  Negative for blood in stool, constipation and diarrhea.  Endocrine: Negative.  Negative for increased urination.  Genitourinary: Negative.  Negative for vaginal dryness.  Musculoskeletal: Positive for arthralgias, joint pain and morning stiffness. Negative for joint  swelling, myalgias, muscle weakness, muscle tenderness and myalgias.  Skin: Negative for color change, rash, hair loss, skin tightness, ulcers and sensitivity to sunlight.  Allergic/Immunologic: Negative.  Negative for susceptible to infections.  Neurological: Negative.  Negative for dizziness, memory loss and night sweats.  Hematological: Negative.  Negative for swollen glands.  Psychiatric/Behavioral: Positive for depressed mood and sleep disturbance. Negative for suicidal ideas. The patient is not nervous/anxious.        Has discussed with primary care / Cymbalta and Abilify  helps some     PMFS History:  Patient Active Problem List   Diagnosis Date Noted  . History of COPD 05/31/2017  . Fatigue 12/04/2016  . History of seizures 10/23/2016  . History of hepatitis C 10/23/2016  . ANA positive 10/23/2016  . Sicca syndrome, unspecified (HCC) 10/23/2016  . Seropositive rheumatoid arthritis (HCC) 08/01/2016  . High risk medication use 08/01/2016  . Hepatitis C 08/01/2016  . Tobacco abuse 08/01/2016  . Depression 08/01/2016  . Complex partial seizure (HCC) 02/02/2016  . MCI (mild cognitive impairment) 11/02/2015  . Arthralgia 11/02/2015  . Carpal tunnel syndrome 10/20/2015  . Syncope 05/17/2015  . Convulsions/seizures (HCC) 05/17/2015    Past Medical History:  Diagnosis Date  . Arthritis 02/01/16   dx w/rheumatoid arthritis  . Carpal tunnel syndrome 10/20/2015   right  . Carpal tunnel syndrome on both sides   . Dysuria   . Hypokalemia   . Liver disease    hepatitis c  . Seasonal  allergies   . Seizures (HCC)    last 05/05/15    Family History  Problem Relation Age of Onset  . Pancreatic cancer Mother   . COPD Father    No past surgical history on file. Social History   Social History Narrative  . No narrative on file     Objective: Vital Signs: BP 138/80   Pulse 78   Resp 16   Ht 5\' 6"  (1.676 m)   Wt 202 lb (91.6 kg)   BMI 32.60 kg/m    Physical Exam    Constitutional: She is oriented to person, place, and time. She appears well-developed and well-nourished.  HENT:  Head: Normocephalic and atraumatic.  Eyes: Conjunctivae and EOM are normal.  Neck: Normal range of motion.  Cardiovascular: Normal rate, regular rhythm, normal heart sounds and intact distal pulses.   Pulmonary/Chest: Effort normal and breath sounds normal.  Abdominal: Soft. Bowel sounds are normal.  Lymphadenopathy:    She has no cervical adenopathy.  Neurological: She is alert and oriented to person, place, and time.  Skin: Skin is warm and dry. Capillary refill takes less than 2 seconds.  Psychiatric: She has a normal mood and affect. Her behavior is normal.  Nursing note and vitals reviewed.    Musculoskeletal Exam: C-spine limited range of motion. Shoulder joints although joints wrist joints are good range of motion. She has synovial thickening over her MCP joints but no synovitis was noted. She has some DIP PIP thickening in her hands consistent with osteoarthritis. She has discomfort with range of motion of her hip joints due to trochanteric bursitis. Knee joints ankles MTPs PIPs did not show any synovitis.  CDAI Exam: CDAI Homunculus Exam:   Joint Counts:  CDAI Tender Joint count: 0 CDAI Swollen Joint count: 0  Global Assessments:  Patient Global Assessment: 5 Provider Global Assessment: 3  CDAI Calculated Score: 8    Investigation: Findings:  10/31/2016 negative TB gold   CBC Latest Ref Rng & Units 04/29/2017 02/28/2017 10/31/2016  WBC 3.8 - 10.8 K/uL 4.1 6.0 4.8  Hemoglobin 11.7 - 15.5 g/dL 29.5 62.1 30.8  Hematocrit 35.0 - 45.0 % 43.7 42.4 40.5  Platelets 140 - 400 K/uL 165 216 210   CMP Latest Ref Rng & Units 04/29/2017 02/28/2017 10/31/2016  Glucose 65 - 99 mg/dL 86 75 83  BUN 7 - 25 mg/dL 11 9 6(L)  Creatinine 6.57 - 1.05 mg/dL 8.46 9.62 9.52  Sodium 135 - 146 mmol/L 143 140 140  Potassium 3.5 - 5.3 mmol/L 3.8 4.3 2.8(L)  Chloride 98 - 110 mmol/L  105 103 101  CO2 20 - 32 mmol/L 28 24 27   Calcium 8.6 - 10.4 mg/dL 9.4 9.4 9.2  Total Protein 6.1 - 8.1 g/dL 6.3 6.7 6.2  Total Bilirubin 0.2 - 1.2 mg/dL 0.5 0.3 0.4  Alkaline Phos 33 - 130 U/L 69 102 88  AST 10 - 35 U/L 19 25 21   ALT 6 - 29 U/L 24 26 20   LDL 139 Imaging: No results found.  Speciality Comments: Need Hep C quant every 6 months    Procedures:  Large Joint Inj Date/Time: 05/31/2017 12:18 PM Performed by: Pollyann Savoy Authorized by: Pollyann Savoy   Consent Given by:  Patient Site marked: the procedure site was marked   Timeout: prior to procedure the correct patient, procedure, and site was verified   Indications:  Pain Location:  Hip Site:  R greater trochanter Prep: patient was prepped and draped in usual  sterile fashion   Needle Size:  27 G Needle Length:  1.5 inches Approach:  Lateral Ultrasound Guidance: No   Fluoroscopic Guidance: No   Arthrogram: No   Medications:  40 mg triamcinolone acetonide 40 MG/ML; 1.5 mL lidocaine 1 % Aspiration Attempted: No   Aspirate amount (mL):  0 Patient tolerance:  Patient tolerated the procedure well with no immediate complications   Allergies: Patient has no known allergies.   Assessment / Plan:     Visit Diagnoses: Seropositive rheumatoid arthritis (HCC): She continues to have some arthralgias but no synovitis was noted. She is doing quite well on current combination of medications.  High risk medication use - Actemra sub q Q O wK,  Arava 20 mg po qd. Her labs were normal in August. We will continue to monitor her labs every 3 months. Her LDL was slightly elevated. She states her PCP is aware and is monitoring.  Other fatigue: She states she has COPD hand have some shortness of breath with exertion.  Sicca syndrome, unspecified (HCC): She is using over-the-counter medications. Is smoking could be contradicting to sicca symptoms as well.  Trochanteric bursitis of right hip: She has a lot of discomfort in  her right trochanteric area.Per patient's request right trochanter was injected as described above. A handout on IT band exercises was given.  MCI (mild cognitive impairment)  Tobacco abuse: The smoking cessation was discussed.  History of COPD : She's recently been diagnosed with COPD.   Association of heart disease with rheumatoid arthritis was discussed. Need to monitor blood pressure, cholesterol, and to exercise 30-60 minutes on daily basis was discussed. Poor dental hygiene can be a predisposing factor for rheumatoid arthritis. Good dental hygiene was discussed.  Orders: Orders Placed This Encounter  Procedures  . Large Joint Injection/Arthrocentesis   No orders of the defined types were placed in this encounter.   Face-to-face time spent with patient was 30 minutes. Greater than 50% of time was spent in counseling and coordination of care.  Follow-Up Instructions: Return in about 5 months (around 10/31/2017) for Rheumatoid arthritis.   Pollyann Savoy, MD  Note - This record has been created using Animal nutritionist.  Chart creation errors have been sought, but may not always  have been located. Such creation errors do not reflect on  the standard of medical care.

## 2017-05-23 ENCOUNTER — Telehealth: Payer: Self-pay | Admitting: Rheumatology

## 2017-05-23 MED ORDER — PREDNISONE 5 MG PO TABS: ORAL_TABLET | ORAL | 0 refills | Status: DC

## 2017-05-23 NOTE — Telephone Encounter (Signed)
Patient states lt hip injection did not take care of pain she is having, and now rt hip is hurting. Patient requesting an rx for Prednisone to be called into [facility] in [place]. Please call patient.

## 2017-05-23 NOTE — Telephone Encounter (Signed)
Patient advised prescription sent to the pharmacy.  

## 2017-05-23 NOTE — Telephone Encounter (Signed)
Patient was given an injection in her left hip on 04/29/17. Patient states it did not help at all. Patient states she is now having pain in her right hip as well. Patient is requesting a prescription for prednisone to help with the pain. Patient has a follow up on 05/31/17. Patient is on Actemra and [place]. Patient is due for next Actemra  Injection on Tuesday. Please advise.

## 2017-05-23 NOTE — Telephone Encounter (Signed)
Prednisone %mg 4x2d, 3x2d, 2x2d, 1x2d, 1/2x2d

## 2017-05-23 NOTE — Addendum Note (Signed)
Addended by: Henriette Combs on: 05/23/2017 05:00 PM   Modules accepted: Orders

## 2017-05-31 ENCOUNTER — Ambulatory Visit (INDEPENDENT_AMBULATORY_CARE_PROVIDER_SITE_OTHER): Payer: 59 | Admitting: Rheumatology

## 2017-05-31 VITALS — BP 138/80 | HR 78 | Resp 16 | Ht 66.0 in | Wt 202.0 lb

## 2017-05-31 DIAGNOSIS — R5383 Other fatigue: Secondary | ICD-10-CM

## 2017-05-31 DIAGNOSIS — G3184 Mild cognitive impairment, so stated: Secondary | ICD-10-CM | POA: Diagnosis not present

## 2017-05-31 DIAGNOSIS — R768 Other specified abnormal immunological findings in serum: Secondary | ICD-10-CM

## 2017-05-31 DIAGNOSIS — Z8709 Personal history of other diseases of the respiratory system: Secondary | ICD-10-CM | POA: Insufficient documentation

## 2017-05-31 DIAGNOSIS — M7061 Trochanteric bursitis, right hip: Secondary | ICD-10-CM

## 2017-05-31 DIAGNOSIS — M3500 Sicca syndrome, unspecified: Secondary | ICD-10-CM | POA: Diagnosis not present

## 2017-05-31 DIAGNOSIS — M059 Rheumatoid arthritis with rheumatoid factor, unspecified: Secondary | ICD-10-CM | POA: Diagnosis not present

## 2017-05-31 DIAGNOSIS — Z720 Tobacco use: Secondary | ICD-10-CM

## 2017-05-31 DIAGNOSIS — Z79899 Other long term (current) drug therapy: Secondary | ICD-10-CM | POA: Diagnosis not present

## 2017-05-31 MED ORDER — LIDOCAINE HCL 1 % IJ SOLN
1.5000 mL | INTRAMUSCULAR | Status: AC | PRN
  Administered 2017-05-31: 1.5 mL

## 2017-05-31 MED ORDER — TRIAMCINOLONE ACETONIDE 40 MG/ML IJ SUSP
40.0000 mg | INTRAMUSCULAR | Status: AC | PRN
  Administered 2017-05-31: 40 mg via INTRA_ARTICULAR

## 2017-05-31 NOTE — Patient Instructions (Addendum)
Standing Labs We placed an order today for your standing lab work.    Please come back and get your standing labs in November and every 3 months  We have open lab Monday through Friday from 8:30-11:30 AM and 1:30-4 PM at the office of Dr. Pollyann Savoy.   The office is located at [address], [place], [place] [address] No appointment is necessary.   Labs are drawn by [facility].  You may receive a bill from [facility] for your lab work. If you have any questions regarding directions or hours of operation,  please call [phone].    Association of heart disease with rheumatoid arthritis was discussed. Need to monitor blood pressure, cholesterol, and to exercise 30-60 minutes on daily basis was discussed. Poor dental hygiene can be a predisposing factor for rheumatoid arthritis. Good dental hygiene was discussed. Iliotibial Bursitis Rehab Ask your health care provider which exercises are safe for you. Do exercises exactly as told by your health care provider and adjust them as directed. It is normal to feel mild stretching, pulling, tightness, or discomfort as you do these exercises, but you should stop right away if you feel sudden pain or your pain gets worse.Do not begin these exercises until told by your health care provider. Stretching and range of motion exercises These exercises warm up your muscles and joints and improve the movement and flexibility of your leg. These exercises also help to relieve pain and stiffness. Exercise A: Quadriceps stretch, prone  1. Lie on your abdomen on a firm surface, such as a bed or padded floor. 2. Bend your left / right knee and hold your ankle. If you cannot reach your ankle or pant leg, loop a belt around your foot and grab the belt instead. 3. Gently pull your heel toward your buttocks. Your knee should not slide out to the side. You should feel a stretch in the front of your thigh and knee. 4. Hold this position for __________  seconds. Repeat __________ times. Complete this exercise __________ times a day. Exercise B: Lunge ( adductor stretch) 1. Stand and spread your legs about 3 feet (about 1 m) apart. Put your left / right leg slightly back for balance. 2. Lean away from your left / right leg by bending your other knee and shifting your weight toward your bent knee. You may rest your hands on your thigh for balance. You should feel a stretch in your left / right inner thigh. 3. Hold for __________ seconds. Repeat __________ times. Complete this exercise __________ times a day. Exercise C: Hamstring stretch, supine  1. Lie on your back. 2. Hold both ends of a belt or towel as you loop it over the ball of your left / right foot. The ball of your foot is on the walking surface, right under your toes. 3. Straighten your left / right knee and slowly pull on the belt to raise your leg. Stop when you feel a gentle stretch in the back of your left / right knee or thigh. ? Do not let your left / right knee bend. ? Keep your other leg flat on the floor. 4. Hold this position for __________ seconds. Repeat __________ times. Complete this exercise __________ times a day. Strengthening exercises These exercises build strength and endurance in your leg. Endurance is the ability to use your muscles for a long time, even after they get tired. Exercise D: Quadriceps wall slides  1. Lean your back against a smooth wall  or door while you walk your feet out 18-24 inches (46-61 cm) from it. 2. Place your feet hip-width apart. 3. Slowly slide down the wall or door until your knees bend as far as told by your health care provider. Keep your knees over your heels, not your toes. Keep your knees in line with your hips. 4. Hold for __________ seconds. 5. Push through your heels to stand up to rest for __________ seconds after each repetition. Repeat __________ times. Complete this exercise __________ times a day. Exercise E: Straight  leg raises ( hip abductors) 1. Lie on your side, with your left / right leg in the top position. Lie so your head, shoulder, knee, and hip line up with each other. You may bend your bottom knee to help you balance. 2. Lift your top leg 4-6 inches (10-15 cm) while keeping your toes pointed straight ahead. 3. Hold this position for __________ seconds. 4. Slowly lower your leg to the starting position. Allow your muscles to relax completely after each repetition. Repeat __________ times. Complete this exercise __________ times a day. Exercise F: Straight leg raises ( hip extensors) 1. Lie on your abdomen on a firm surface. You can put a pillow under your hips if that is more comfortable. 2. Tense the muscles in your buttocks and lift your left / right leg about 4-6 inches (10-15 cm). Keep your knee straight as you lift your leg. 3. Hold this position for __________ seconds. 4. Slowly lower your leg to the starting position. 5. Let your leg relax completely after each repetition. Repeat __________ times. Complete this exercise __________ times a day. Exercise G: Bridge ( hip extensors) 1. Lie on your back on a firm surface with your knees bent and your feet flat on the floor. 2. Tighten your buttocks muscles and lift your bottom off the floor until your trunk is level with your thighs. ? Do not arch your back. ? You should feel the muscles working in your buttocks and the back of your thighs. If you do not feel these muscles, slide your feet 1-2 inches (2.5-5 cm) farther away from your buttocks. 3. Hold this position for __________ seconds. 4. Slowly lower your hips to the starting position. 5. Let your buttocks muscles relax completely between repetitions. 6. If this exercise is too easy, try doing it with your arms crossed over your chest. Repeat __________ times. Complete this exercise __________ times a day. This information is not intended to replace advice given to you by your health care  provider. Make sure you discuss any questions you have with your health care provider. Document Released: 08/20/2005 Document Revised: 04/26/2016 Document Reviewed: 08/02/2015 Elsevier Interactive Patient Education  [employer].

## 2017-06-06 ENCOUNTER — Telehealth: Payer: Self-pay | Admitting: Rheumatology

## 2017-06-06 DIAGNOSIS — M7062 Trochanteric bursitis, left hip: Principal | ICD-10-CM

## 2017-06-06 DIAGNOSIS — M7061 Trochanteric bursitis, right hip: Secondary | ICD-10-CM

## 2017-06-06 NOTE — Telephone Encounter (Signed)
Patient calling due to worsening rt hip pain. Injection Friday did not help at all. Please call patient to advise.

## 2017-06-06 NOTE — Telephone Encounter (Signed)
Pred 5mg  4x2d, 3x2d, 2x2d, 1x2d. She will get long term benefit from PT. We can refer her to if she agrees.

## 2017-06-06 NOTE — Telephone Encounter (Signed)
Patient states she was given an injection in her right hip on 05/31/17, but has not had much improvement. Patient states this happened when she had the injection done with her left hip and she had a taper of prednisone before the pain got better. Patient states the pain in her hip is really giving her a fit. Patient states she would like to have a prescription for prednisone. Please advise.

## 2017-06-07 MED ORDER — PREDNISONE 5 MG PO TABS: ORAL_TABLET | ORAL | 0 refills | Status: DC

## 2017-06-07 NOTE — Telephone Encounter (Signed)
Prescription sent to the pharmacy and patient advised. She is agreeable to PT and referral placed.

## 2017-06-11 ENCOUNTER — Ambulatory Visit: Payer: Commercial Managed Care - HMO | Admitting: Nurse Practitioner

## 2017-07-10 ENCOUNTER — Telehealth: Payer: Self-pay | Admitting: Rheumatology

## 2017-07-10 MED ORDER — PREDNISONE 5 MG PO TABS: ORAL_TABLET | ORAL | 0 refills | Status: DC

## 2017-07-10 NOTE — Telephone Encounter (Signed)
Pred  as rxd before

## 2017-07-10 NOTE — Telephone Encounter (Signed)
Patient requesting for an rx to be called into for Prednisone to [facility] in [place]. Patient having pain in hips, and lt knee now. Please call to advise.

## 2017-07-10 NOTE — Telephone Encounter (Signed)
Patient advised prescription sent to pharmacy for prednisone.

## 2017-07-10 NOTE — Telephone Encounter (Signed)
Patient states she is having pain in left leg  Starting at her knee for 4 days. Patient states she is not having pain any where else. Patient is currently on [place] Patient's last injection was 07/09/17. Patient is requesting a prescription for prednisone. Please advise.

## 2017-07-19 ENCOUNTER — Other Ambulatory Visit: Payer: Self-pay | Admitting: Rheumatology

## 2017-07-19 NOTE — Telephone Encounter (Signed)
Last Visit: 05/31/17 Next Visit: 10/31/17 Labs: 04/29/17 stable  TB Gold: 10/31/16 Neg  Okay to refill per Dr. Corliss Skains

## 2017-07-30 ENCOUNTER — Other Ambulatory Visit: Payer: Self-pay | Admitting: Rheumatology

## 2017-07-30 NOTE — Telephone Encounter (Signed)
Last Visit: 05/31/17 Next Visit: 10/31/17 Labs: 07/03/17 stable  Okay to refill per Dr. Corliss Skains

## 2017-08-19 ENCOUNTER — Telehealth: Payer: Self-pay

## 2017-08-19 NOTE — Telephone Encounter (Signed)
Patient has been scheduled for an appointment on 08/21/17. Patient has has a prednisone sent in monthly since July 2018. Patient is currently on [place] and Actemra.

## 2017-08-19 NOTE — Telephone Encounter (Signed)
Patient called stating that she is having left leg pain.  Would like to know if a Rx for Prednisone can be called into [facility] in [place].  Cb# is [phone].  Please advise.  Thank you.

## 2017-08-20 NOTE — Progress Notes (Signed)
Office Visit Note  Patient: Christina Jenkins             Date of Birth: 07-Feb-1961           MRN: [id number]             PCP: Encarnacion Slates, PA-C Referring: Encarnacion Slates, PA-C Visit Date: 08/23/2017 Occupation: @GUAROCC @    Subjective:  Left hip pain   History of Present Illness: Christina Jenkins is a 56 y.o. female with history of sero positive rheumatoid arthritis. She's been on Actemra and Arava combination which is been working quite well for her. She denies any joint swelling. She's been having pain and discomfort in her left trochanteric bursa. She states the problems with the left trochanteric bursitis has been recurrent . She has taken prednisone taper for that.  Activities of Daily Living:  Patient reports morning stiffness for 45 minutes.   Patient Reports nocturnal pain. Left trochanteric Difficulty dressing/grooming: Denies Difficulty climbing stairs: Reports due to left trochanter pain Difficulty getting out of chair: Reports Difficulty using hands for taps, buttons, cutlery, and/or writing: Denies   Review of Systems  Constitutional: Negative for fatigue, night sweats, weight gain, weight loss and weakness.  HENT: Positive for mouth dryness. Negative for mouth sores, trouble swallowing, trouble swallowing and nose dryness.   Eyes: Negative for pain, redness, visual disturbance and dryness.  Respiratory: Negative for cough, shortness of breath and difficulty breathing.        COPD  Cardiovascular: Negative for chest pain, palpitations, hypertension, irregular heartbeat and swelling in legs/feet.  Gastrointestinal: Negative for blood in stool, constipation and diarrhea.  Endocrine: Negative for increased urination.  Genitourinary: Negative for vaginal dryness.  Musculoskeletal: Positive for arthralgias, joint pain and morning stiffness. Negative for joint swelling, myalgias, muscle weakness, muscle tenderness and myalgias.  Skin: Negative for color change, rash, hair  loss, skin tightness, ulcers and sensitivity to sunlight.  Allergic/Immunologic: Negative for susceptible to infections.  Neurological: Positive for seizures. Negative for dizziness, memory loss and night sweats.  Hematological: Negative for swollen glands.  Psychiatric/Behavioral: Positive for depressed mood and sleep disturbance. The patient is not nervous/anxious.     PMFS History:  Patient Active Problem List   Diagnosis Date Noted  . History of COPD 05/31/2017  . Fatigue 12/04/2016  . History of seizures 10/23/2016  . History of hepatitis C 10/23/2016  . ANA positive 10/23/2016  . Sicca syndrome, unspecified (HCC) 10/23/2016  . Seropositive rheumatoid arthritis (HCC) 08/01/2016  . High risk medication use 08/01/2016  . Hepatitis C 08/01/2016  . Tobacco abuse 08/01/2016  . Depression 08/01/2016  . Complex partial seizure (HCC) 02/02/2016  . MCI (mild cognitive impairment) 11/02/2015  . Arthralgia 11/02/2015  . Carpal tunnel syndrome 10/20/2015  . Syncope 05/17/2015  . Convulsions/seizures (HCC) 05/17/2015    Past Medical History:  Diagnosis Date  . Arthritis 02/01/16   dx w/rheumatoid arthritis  . Carpal tunnel syndrome 10/20/2015   right  . Carpal tunnel syndrome on both sides   . Dysuria   . Hypokalemia   . Liver disease    hepatitis c  . Seasonal allergies   . Seizures (HCC)    last 05/05/15    Family History  Problem Relation Age of Onset  . Pancreatic cancer Mother   . COPD Father   . Diabetes Brother    Past Surgical History:  Procedure Laterality Date  . TONSILLECTOMY     Social History   Social History  Narrative  . Not on file     Objective: Vital Signs: BP 118/84 (BP Location: Left Arm, Patient Position: Sitting, Cuff Size: Normal)   Pulse 75   Resp 17   Ht 5\' 6"  (1.676 m)   Wt 213 lb (96.6 kg)   BMI 34.38 kg/m    Physical Exam  Constitutional: She is oriented to person, place, and time. She appears well-developed and well-nourished.    HENT:  Head: Normocephalic and atraumatic.  Eyes: Conjunctivae and EOM are normal.  Neck: Normal range of motion.  Cardiovascular: Normal rate, regular rhythm, normal heart sounds and intact distal pulses.  Pulmonary/Chest: Effort normal and breath sounds normal.  Abdominal: Soft. Bowel sounds are normal.  Lymphadenopathy:    She has no cervical adenopathy.  Neurological: She is alert and oriented to person, place, and time.  Skin: Skin is warm and dry. Capillary refill takes less than 2 seconds.  Psychiatric: She has a normal mood and affect. Her behavior is normal.  Nursing note and vitals reviewed.    Musculoskeletal Exam: C-spine and thoracic lumbar spine good range of motion. Shoulder joints elbow joints wrist joints are good range of motion. She had no synovitis over her MCP joints. Synovial thickening was noted. She has DIP PIP thickening in her hands and feet with no synovitis. Hip joints knee joints are good range of motion with no swelling in the knee joints. She has tenderness on palpation over left trochanteric bursa consistent with trochanteric bursitis.  CDAI Exam: CDAI Homunculus Exam:   Joint Counts:  CDAI Tender Joint count: 0 CDAI Swollen Joint count: 0  Global Assessments:  Patient Global Assessment: 3 Provider Global Assessment: 3  CDAI Calculated Score: 6    Investigation: No additional findings.TB Gold: 10/31/2016 Negative  CBC Latest Ref Rng & Units 04/29/2017 02/28/2017 10/31/2016  WBC 3.8 - 10.8 K/uL 4.1 6.0 4.8  Hemoglobin 11.7 - 15.5 g/dL 11/02/2016 60.7 37.1  Hematocrit 35.0 - 45.0 % 43.7 42.4 40.5  Platelets 140 - 400 K/uL 165 216 210   CMP Latest Ref Rng & Units 04/29/2017 02/28/2017 10/31/2016  Glucose 65 - 99 mg/dL 86 75 83  BUN 7 - 25 mg/dL 11 9 6(L)  Creatinine 11/02/2016 - 1.05 mg/dL 6.94 8.54 6.27  Sodium 135 - 146 mmol/L 143 140 140  Potassium 3.5 - 5.3 mmol/L 3.8 4.3 2.8(L)  Chloride 98 - 110 mmol/L 105 103 101  CO2 20 - 32 mmol/L 28 24 27   Calcium  8.6 - 10.4 mg/dL 9.4 9.4 9.2  Total Protein 6.1 - 8.1 g/dL 6.3 6.7 6.2  Total Bilirubin 0.2 - 1.2 mg/dL 0.5 0.3 0.4  Alkaline Phos 33 - 130 U/L 69 102 88  AST 10 - 35 U/L 19 25 21   ALT 6 - 29 U/L 24 26 20     Imaging: No results found.  Speciality Comments: Need Hep C quant every 6 months    Procedures:  Large Joint Inj: L greater trochanter on 08/23/2017 10:07 AM Indications: pain and diagnostic evaluation Details: 27 G 1.5 in needle, lateral approach  Arthrogram: No  Medications: 1.5 mL lidocaine 1 %; 40 mg triamcinolone acetonide 40 MG/ML Aspirate: 0 mL Outcome: tolerated well, no immediate complications Procedure, treatment alternatives, risks and benefits explained, specific risks discussed. Consent was given by the patient. Immediately prior to procedure a time out was called to verify the correct patient, procedure, equipment, support staff and site/side marked as required. Patient was prepped and draped in the usual sterile  fashion.     Allergies: Patient has no known allergies.   Assessment / Plan:     Visit Diagnoses: Seropositive rheumatoid arthritis (HCC): Patient is clinically doing well with no synovitis on examination in my opinion her rheumatoid arthritis is very well controlled.  High risk medication use - Actemra sq every other, Arava 20 mg by mouth daily - Plan: QuantiFERON-TB Gold Plus, Lipid panel with her next labs in March.  Trochanteric bursitis of left hip: She's been having severe pain and discomfort in the left trochanteric area. Different treatment options and their side effects were discussed at length. Patient requested a cortisone injection. The procedures described above. I've also referred her to physical therapy which she will receive in [place].  Sicca syndrome, unspecified (HCC): Over-the-counter products were discussed.  Other fatigue: Secondary to nocturnal pain  History of COPD: The smoking cessation was discussed at length.  Tobacco  abuse: Association of smoking with rheumatoid arthritis was emphasized.  MCI (mild cognitive impairment)  History of seizures    Orders: Orders Placed This Encounter  Procedures  . Large Joint Inj  . QuantiFERON-TB Gold Plus  . Lipid panel   No orders of the defined types were placed in this encounter.   Face-to-face time spent with patient was 30 minutes. Greater than50% of time was spent in counseling and coordination of care.  Follow-Up Instructions: Return in about 5 months (around 01/21/2018) for Rheumatoid arthritis.   Pollyann Savoy, MD  Note - This record has been created using Animal nutritionist.  Chart creation errors have been sought, but may not always  have been located. Such creation errors do not reflect on  the standard of medical care.

## 2017-08-21 ENCOUNTER — Ambulatory Visit: Payer: Self-pay | Admitting: Rheumatology

## 2017-08-23 ENCOUNTER — Encounter: Payer: Self-pay | Admitting: Rheumatology

## 2017-08-23 ENCOUNTER — Ambulatory Visit: Payer: 59 | Admitting: Rheumatology

## 2017-08-23 ENCOUNTER — Other Ambulatory Visit: Payer: Self-pay | Admitting: Rheumatology

## 2017-08-23 VITALS — BP 118/84 | HR 75 | Resp 17 | Ht 66.0 in | Wt 213.0 lb

## 2017-08-23 DIAGNOSIS — Z87898 Personal history of other specified conditions: Secondary | ICD-10-CM

## 2017-08-23 DIAGNOSIS — M3500 Sicca syndrome, unspecified: Secondary | ICD-10-CM

## 2017-08-23 DIAGNOSIS — Z720 Tobacco use: Secondary | ICD-10-CM

## 2017-08-23 DIAGNOSIS — Z79899 Other long term (current) drug therapy: Secondary | ICD-10-CM | POA: Diagnosis not present

## 2017-08-23 DIAGNOSIS — Z8709 Personal history of other diseases of the respiratory system: Secondary | ICD-10-CM

## 2017-08-23 DIAGNOSIS — M059 Rheumatoid arthritis with rheumatoid factor, unspecified: Secondary | ICD-10-CM

## 2017-08-23 DIAGNOSIS — M7062 Trochanteric bursitis, left hip: Secondary | ICD-10-CM | POA: Diagnosis not present

## 2017-08-23 DIAGNOSIS — R5383 Other fatigue: Secondary | ICD-10-CM

## 2017-08-23 DIAGNOSIS — G3184 Mild cognitive impairment, so stated: Secondary | ICD-10-CM | POA: Diagnosis not present

## 2017-08-23 MED ORDER — LIDOCAINE HCL 1 % IJ SOLN
1.5000 mL | INTRAMUSCULAR | Status: AC | PRN
  Administered 2017-08-23: 1.5 mL

## 2017-08-23 MED ORDER — TRIAMCINOLONE ACETONIDE 40 MG/ML IJ SUSP
40.0000 mg | INTRAMUSCULAR | Status: AC | PRN
  Administered 2017-08-23: 40 mg via INTRA_ARTICULAR

## 2017-08-23 NOTE — Telephone Encounter (Signed)
Last Visit: 08/23/17 Next visit: 01/23/18 Labs: 07/03/17 Creat 1.04 previous 0.99 TB Gold: 10/31/16 Neg   Okay to refill refill per Dr. Corliss Skains

## 2017-08-23 NOTE — Patient Instructions (Addendum)
Standing Labs We placed an order today for your standing lab work.    Please come back and get your standing labs in March and every 3 months Fasting lipid panel and TB gold  in March We have open lab Monday through Friday from 8:30-11:30 AM and 1:30-4 PM at the office of Dr. Pollyann Savoy.   The office is located at [address], [address], [place], [place] [address] No appointment is necessary.   Labs are drawn by [facility].  You may receive a bill from [facility] for your lab work. If you have any questions regarding directions or hours of operation,  please call [phone].

## 2017-09-16 ENCOUNTER — Other Ambulatory Visit: Payer: Self-pay | Admitting: Rheumatology

## 2017-09-16 NOTE — Telephone Encounter (Signed)
Last Visit: 08/23/17 Next Visit: 01/23/18 Labs: 07/03/17 Creat 1.04 Previously 0.99 TB Gold: 10/31/16 Neg   Okay to refill per Dr. Corliss Skains

## 2017-10-01 ENCOUNTER — Telehealth: Payer: Self-pay | Admitting: Rheumatology

## 2017-10-01 NOTE — Telephone Encounter (Signed)
I called patient and advised her to wait until her next injection is due.

## 2017-10-01 NOTE — Telephone Encounter (Signed)
Patient was giving herself her bi weekly injection, and realized the medication was running down her leg instead of going into it. Patient needs to know how to proceed. Please call to advise.

## 2017-10-01 NOTE — Telephone Encounter (Signed)
Patient states she went to give herself the Actemra injection today. Patient states she did not end up getting her whole injection as she looked down and realized the medication was running down her leg instead of going into her leg. Patient did not not know if she should injection another injection or wait until she is due in 2 weeks. Please advise.

## 2017-10-04 ENCOUNTER — Telehealth: Payer: Self-pay | Admitting: *Deleted

## 2017-10-04 NOTE — Telephone Encounter (Signed)
Lab results received from PCP. Patient's PCP sent labs. Cholesterol and LDL elevated. Patient advised to discuss with PCP. Patient verbalized understanding.

## 2017-10-16 NOTE — Progress Notes (Addendum)
Office Visit Note  Patient: Christina Jenkins             Date of Birth: May 01, 1961           MRN: [id number]             PCP: Encarnacion Slates, PA-C Referring: Encarnacion Slates, PA-C Visit Date: 10/29/2017 Occupation: @GUAROCC @    Subjective:  Left trochanteric bursitis    History of Present Illness: Christina Jenkins is a 57 y.o. female with history of seropositive rheumatoid arthritis and sicca syndrome.  She states that her last Actemra injection malfunctioned, and the dose was wasted and went down surface of her thigh.  She is not sure how much of the injection she got.  She continues to take Arava 20 mg daily.  She states her hands have been causing some discomfort due to missing her full dose of actemra.  She is due for her next injection today.  She denies any joint swelling.  She continues to trochanteric bursitis of her left hip.  She attended physical therapy, which helped but she continues to have pain.  She performs her exercises at home now that she doesn't have any more PT sessions. 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  She denies any numbness or tinging in her hands. She continues to have sicca symptoms.  She does not use any OTC products.      Activities of Daily Living:  Patient reports morning stiffness for 1 hour.   Patient Reports nocturnal pain.  Difficulty dressing/grooming: Denies Difficulty climbing stairs: Denies Difficulty getting out of chair: Reports Difficulty using hands for taps, buttons, cutlery, and/or writing: Reports   Review of Systems  Constitutional: Negative for fatigue and weakness.  HENT: Positive for mouth sores and mouth dryness. Negative for nose dryness.   Eyes: Negative for pain, redness, visual disturbance and dryness.  Respiratory: Negative for cough, hemoptysis, shortness of breath and difficulty breathing.   Cardiovascular: Negative for chest pain, palpitations, hypertension, irregular heartbeat and swelling in  legs/feet.  Gastrointestinal: Negative for blood in stool, constipation and diarrhea.  Endocrine: Positive for increased urination (On Lasix).  Genitourinary: Negative for painful urination.  Musculoskeletal: Positive for arthralgias, joint pain, joint swelling and morning stiffness. Negative for myalgias, muscle weakness, muscle tenderness and myalgias.  Skin: Negative for color change, pallor, rash, hair loss, nodules/bumps, redness, skin tightness, ulcers and sensitivity to sunlight.  Allergic/Immunologic: Negative for susceptible to infections.  Neurological: Negative for dizziness, light-headedness, numbness and headaches.  Hematological: Negative for swollen glands.  Psychiatric/Behavioral: Positive for depressed mood (On medication). Negative for sleep disturbance. The patient is not nervous/anxious.     PMFS History:  Patient Active Problem List   Diagnosis Date Noted  . History of COPD 05/31/2017  . Fatigue 12/04/2016  . History of seizures 10/23/2016  . History of hepatitis C 10/23/2016  . ANA positive 10/23/2016  . Sicca syndrome, unspecified (HCC) 10/23/2016  . Seropositive rheumatoid arthritis (HCC) 08/01/2016  . High risk medication use 08/01/2016  . Hepatitis C 08/01/2016  . Tobacco abuse 08/01/2016  . Depression 08/01/2016  . Complex partial seizure (HCC) 02/02/2016  . MCI (mild cognitive impairment) 11/02/2015  . Arthralgia 11/02/2015  . Carpal tunnel syndrome 10/20/2015  . Syncope 05/17/2015  . Convulsions/seizures (HCC) 05/17/2015    Past Medical History:  Diagnosis Date  . Arthritis 02/01/16   dx w/rheumatoid arthritis  . Carpal tunnel syndrome 10/20/2015   right  .  Carpal tunnel syndrome on both sides   . Dysuria   . Hypokalemia   . Liver disease    hepatitis c  . Seasonal allergies   . Seizures (HCC)    last 05/05/15    Family History  Problem Relation Age of Onset  . Pancreatic cancer Mother   . COPD Father   . Diabetes Brother    Past Surgical  History:  Procedure Laterality Date  . TONSILLECTOMY     Social History   Social History Narrative  . Not on file     Objective: Vital Signs: BP 140/78 (BP Location: Left Arm, Patient Position: Sitting, Cuff Size: Normal)   Pulse 86   Resp 15   Ht 5\' 6"  (1.676 m)   Wt 213 lb (96.6 kg)   BMI 34.38 kg/m    Physical Exam  Constitutional: She is oriented to person, place, and time. She appears well-developed and well-nourished.  HENT:  Head: Normocephalic and atraumatic.  Eyes: Conjunctivae and EOM are normal.  Neck: Normal range of motion.  Cardiovascular: Normal rate, regular rhythm, normal heart sounds and intact distal pulses.  Pulmonary/Chest: Effort normal and breath sounds normal.  Abdominal: Soft. Bowel sounds are normal.  Lymphadenopathy:    She has no cervical adenopathy.  Neurological: She is alert and oriented to person, place, and time.  Skin: Skin is warm and dry. Capillary refill takes less than 2 seconds.  Psychiatric: She has a normal mood and affect. Her behavior is normal.  Nursing note and vitals reviewed.    Musculoskeletal Exam: C-spine, thoracic, and lumbar spine good ROM.  No midline spinal tenderness.  No SI joint tenderness.  Shoulder joints, elbow joints, wrist joints, MCPs, PIPs, and DIPs good ROM with no synovitis.  She has tenderness of her bilateral 2nd and 3rd MCPs.  She has PIP and DIP synovial thickening consistent with osteoarthritis.  Hip joints, knee joints, ankle joints, MTPs, PIPs, and DIPs good ROM with no synovitis.  She has left trochanteric bursa tenderness.  She has bilateral knee crepitus.  No warmth or effusion of her knees.    CDAI Exam: CDAI Homunculus Exam:   Joint Counts:  CDAI Tender Joint count: 0 CDAI Swollen Joint count: 0  Global Assessments:  Patient Global Assessment: 2 Provider Global Assessment: 2  CDAI Calculated Score: 4    Investigation: No additional findings. CBC Latest Ref Rng & Units 04/29/2017  02/28/2017 10/31/2016  WBC 3.8 - 10.8 K/uL 4.1 6.0 4.8  Hemoglobin 11.7 - 15.5 g/dL 11/02/2016 17.7 93.9  Hematocrit 35.0 - 45.0 % 43.7 42.4 40.5  Platelets 140 - 400 K/uL 165 216 210   CMP Latest Ref Rng & Units 04/29/2017 02/28/2017 10/31/2016  Glucose 65 - 99 mg/dL 86 75 83  BUN 7 - 25 mg/dL 11 9 6(L)  Creatinine 11/02/2016 - 1.05 mg/dL 0.92 3.30 0.76  Sodium 135 - 146 mmol/L 143 140 140  Potassium 3.5 - 5.3 mmol/L 3.8 4.3 2.8(L)  Chloride 98 - 110 mmol/L 105 103 101  CO2 20 - 32 mmol/L 28 24 27   Calcium 8.6 - 10.4 mg/dL 9.4 9.4 9.2  Total Protein 6.1 - 8.1 g/dL 6.3 6.7 6.2  Total Bilirubin 0.2 - 1.2 mg/dL 0.5 0.3 0.4  Alkaline Phos 33 - 130 U/L 69 102 88  AST 10 - 35 U/L 19 25 21   ALT 6 - 29 U/L 24 26 20     Imaging: No results found.  Speciality Comments: Need Hep C quant every 6  months    Procedures:  Large Joint Inj: L greater trochanter on 10/29/2017 11:04 AM Indications: pain Details: 27 G 1.5 in needle, lateral approach  Arthrogram: No  Medications: 1 mL lidocaine 1 %; 40 mg triamcinolone acetonide 40 MG/ML Aspirate: 0 mL Outcome: tolerated well, no immediate complications Procedure, treatment alternatives, risks and benefits explained, specific risks discussed. Consent was given by the patient. Immediately prior to procedure a time out was called to verify the correct patient, procedure, equipment, support staff and site/side marked as required. Patient was prepped and draped in the usual sterile fashion.     Allergies: Patient has no known allergies.   Assessment / Plan:     Visit Diagnoses: Seropositive rheumatoid arthritis (HCC): She has tenderness of her 2nd and 3rd MCPs bilaterally. She has no synovitis on exam.  She had a malfunction with her Actrema injection about 4 weeks ago, so she is having increased hand pain and stiffness.  She has no joint swelling. She will continue on Actemra and Arava 20 mg daily.  She is going to inject her next Actemra dose today.  She feels  comfortable giving herself this injection.  High risk medication use - Actemra and Arava 20 mg. CBC/CMP last obtained on 07/03/17. CBC and CMP were obtained today to monitor for drug toxicity. TB gold negative 10/31/16. TB gold was obtained today. Lipid panel was checked with her PCP.  She was not started on any therapies. - Plan: CBC with Differential/Platelet, COMPLETE METABOLIC PANEL WITH GFR, QuantiFERON-TB Gold Plus  Sicca syndrome, unspecified (HCC): She continues to have sicca symptoms.  Her dry mouth is more significant than dry eyes.  We discussed trying Biotene products.  She does not need any eye drops at this time.   ANA positive: No clinical signs of autoimmune disease at this time.   Trochanteric bursitis, left hip: She has tenderness of her left trochanteric bursa.  She requested a cortisone injection today.  She tolerated the procedure well.  She was advised to monitor her blood pressure.  She is going to continue to perform her exercises that she learned at physicals therapy.   Carpal tunnel syndrome, unspecified laterality: Resolved.  No numbness or tingling of either hand.  Other medical conditions are listed as follows:   Tobacco abuse  History of hepatitis C  History of seizures - on Keppra  Other fatigue  History of COPD  Depression, unspecified depression type    Orders: Orders Placed This Encounter  Procedures  . Large Joint Inj: L greater trochanter  . CBC with Differential/Platelet  . COMPLETE METABOLIC PANEL WITH GFR  . QuantiFERON-TB Gold Plus   Meds ordered this encounter  Medications  . lidocaine (XYLOCAINE) 1 % (with pres) injection 1 mL  . triamcinolone acetonide (KENALOG-40) injection 40 mg    Face-to-face time spent with patient was 30 minutes. 50% of time was spent in counseling and coordination of care.  Follow-Up Instructions: Return in about 5 months (around 03/28/2018) for Rheumatoid arthritis, sicca syndrome.   Pollyann Savoy,  MD  Note - This record has been created using Animal nutritionist.  Chart creation errors have been sought, but may not always  have been located. Such creation errors do not reflect on  the standard of medical care.

## 2017-10-27 ENCOUNTER — Other Ambulatory Visit: Payer: Self-pay | Admitting: Rheumatology

## 2017-10-28 NOTE — Telephone Encounter (Addendum)
Last Visit: 08/23/17 Next Visit: 01/23/18 Labs: 09/30/17 cbc/cmp wnl  Okay to refill per Dr. Corliss Skains

## 2017-10-29 ENCOUNTER — Ambulatory Visit: Payer: 59 | Admitting: Rheumatology

## 2017-10-29 ENCOUNTER — Encounter: Payer: Self-pay | Admitting: Physician Assistant

## 2017-10-29 VITALS — BP 140/78 | HR 86 | Resp 15 | Ht 66.0 in | Wt 213.0 lb

## 2017-10-29 DIAGNOSIS — G5600 Carpal tunnel syndrome, unspecified upper limb: Secondary | ICD-10-CM | POA: Diagnosis not present

## 2017-10-29 DIAGNOSIS — M059 Rheumatoid arthritis with rheumatoid factor, unspecified: Secondary | ICD-10-CM | POA: Diagnosis not present

## 2017-10-29 DIAGNOSIS — M7062 Trochanteric bursitis, left hip: Secondary | ICD-10-CM

## 2017-10-29 DIAGNOSIS — M3500 Sicca syndrome, unspecified: Secondary | ICD-10-CM | POA: Diagnosis not present

## 2017-10-29 DIAGNOSIS — Z8619 Personal history of other infectious and parasitic diseases: Secondary | ICD-10-CM

## 2017-10-29 DIAGNOSIS — R5383 Other fatigue: Secondary | ICD-10-CM | POA: Diagnosis not present

## 2017-10-29 DIAGNOSIS — F32A Depression, unspecified: Secondary | ICD-10-CM

## 2017-10-29 DIAGNOSIS — R768 Other specified abnormal immunological findings in serum: Secondary | ICD-10-CM

## 2017-10-29 DIAGNOSIS — Z8709 Personal history of other diseases of the respiratory system: Secondary | ICD-10-CM | POA: Diagnosis not present

## 2017-10-29 DIAGNOSIS — Z79899 Other long term (current) drug therapy: Secondary | ICD-10-CM

## 2017-10-29 DIAGNOSIS — F329 Major depressive disorder, single episode, unspecified: Secondary | ICD-10-CM

## 2017-10-29 DIAGNOSIS — Z720 Tobacco use: Secondary | ICD-10-CM | POA: Diagnosis not present

## 2017-10-29 DIAGNOSIS — Z87898 Personal history of other specified conditions: Secondary | ICD-10-CM | POA: Diagnosis not present

## 2017-10-29 MED ORDER — LIDOCAINE HCL 1 % IJ SOLN
1.0000 mL | INTRAMUSCULAR | Status: AC | PRN
  Administered 2017-10-29: 1 mL

## 2017-10-29 MED ORDER — TRIAMCINOLONE ACETONIDE 40 MG/ML IJ SUSP
40.0000 mg | INTRAMUSCULAR | Status: AC | PRN
  Administered 2017-10-29: 40 mg via INTRA_ARTICULAR

## 2017-10-29 NOTE — Patient Instructions (Signed)
Standing Labs We placed an order today for your standing lab work.    Please come back and get your standing labs in May and every 3 months  We have open lab Monday through Friday from 8:30-11:30 AM and 1:30-4 PM at the office of Dr. Shaili Deveshwar.   The office is located at [address], [address], [place], [place] [address] No appointment is necessary.   Labs are drawn by [facility].  You may receive a bill from [facility] for your lab work. If you have any questions regarding directions or hours of operation,  please call [phone].    

## 2017-10-31 ENCOUNTER — Ambulatory Visit: Payer: 59 | Admitting: Rheumatology

## 2017-10-31 LAB — CBC WITH DIFFERENTIAL/PLATELET
BASOS ABS: 112 {cells}/uL (ref 0–200)
Basophils Relative: 1.3 %
EOS ABS: 241 {cells}/uL (ref 15–500)
EOS PCT: 2.8 %
HCT: 46.4 % — ABNORMAL HIGH (ref 35.0–45.0)
Hemoglobin: 15.7 g/dL — ABNORMAL HIGH (ref 11.7–15.5)
Lymphs Abs: 1514 cells/uL (ref 850–3900)
MCH: 30.7 pg (ref 27.0–33.0)
MCHC: 33.8 g/dL (ref 32.0–36.0)
MCV: 90.8 fL (ref 80.0–100.0)
MONOS PCT: 11.3 %
MPV: 11.2 fL (ref 7.5–12.5)
Neutro Abs: 5762 cells/uL (ref 1500–7800)
Neutrophils Relative %: 67 %
PLATELETS: 244 10*3/uL (ref 140–400)
RBC: 5.11 10*6/uL — ABNORMAL HIGH (ref 3.80–5.10)
RDW: 11.6 % (ref 11.0–15.0)
TOTAL LYMPHOCYTE: 17.6 %
WBC mixed population: 972 cells/uL — ABNORMAL HIGH (ref 200–950)
WBC: 8.6 10*3/uL (ref 3.8–10.8)

## 2017-10-31 LAB — QUANTIFERON-TB GOLD PLUS
Mitogen-NIL: 4.29 IU/mL
NIL: 0.03 IU/mL
QuantiFERON-TB Gold Plus: NEGATIVE
TB2-NIL: 0 [IU]/mL

## 2017-10-31 LAB — COMPLETE METABOLIC PANEL WITH GFR
AG Ratio: 1.9 (calc) (ref 1.0–2.5)
ALT: 22 U/L (ref 6–29)
AST: 21 U/L (ref 10–35)
Albumin: 4.4 g/dL (ref 3.6–5.1)
Alkaline phosphatase (APISO): 79 U/L (ref 33–130)
BILIRUBIN TOTAL: 0.5 mg/dL (ref 0.2–1.2)
BUN: 10 mg/dL (ref 7–25)
CHLORIDE: 102 mmol/L (ref 98–110)
CO2: 31 mmol/L (ref 20–32)
Calcium: 9.9 mg/dL (ref 8.6–10.4)
Creat: 0.99 mg/dL (ref 0.50–1.05)
GFR, Est African American: 73 mL/min/{1.73_m2} (ref >=60)
GFR, Est Non African American: 63 mL/min/{1.73_m2} (ref >=60)
GLUCOSE: 93 mg/dL (ref 65–99)
Globulin: 2.3 g/dL (calc) (ref 1.9–3.7)
Potassium: 3.7 mmol/L (ref 3.5–5.3)
Sodium: 141 mmol/L (ref 135–146)
Total Protein: 6.7 g/dL (ref 6.1–8.1)

## 2017-10-31 NOTE — Progress Notes (Signed)
RBC, hgb, hct are elevated. We will continue to monitor.  CMP WNL. TB gold negative.

## 2017-12-13 ENCOUNTER — Other Ambulatory Visit: Payer: Self-pay | Admitting: Rheumatology

## 2017-12-13 NOTE — Telephone Encounter (Signed)
Last Visit: 10/29/17 Next Visit: 03/24/18 Labs: 10/29/17 RBC, hgb, hct are elevated. We will continue to monitor. CMP WNL. TB Gold: 10/29/17 Neg   Okay to refill per Dr. Corliss Skains

## 2018-01-23 ENCOUNTER — Ambulatory Visit: Payer: 59 | Admitting: Rheumatology

## 2018-01-31 ENCOUNTER — Other Ambulatory Visit: Payer: Self-pay | Admitting: Rheumatology

## 2018-02-03 NOTE — Telephone Encounter (Addendum)
Last Visit: 10/29/17 Next Visit: 03/24/18 Labs: 10/29/17 RBC, hgb, hct are elevated. We will continue to monitor. CMP WNL.  Patient advised she is due for labs and will update with her PCP.   Okay to refill 30 day supply per Dr. Corliss Skains

## 2018-03-04 ENCOUNTER — Other Ambulatory Visit: Payer: Self-pay | Admitting: Rheumatology

## 2018-03-04 NOTE — Telephone Encounter (Addendum)
Last Visit: 10/29/17 Next Visit: 03/24/18 Labs:10/29/17 RBC, hgb, hct are elevated. We will continue to monitor. CMP WNL.  Okay to refill per Dr. Corliss Skains

## 2018-03-08 ENCOUNTER — Other Ambulatory Visit: Payer: Self-pay | Admitting: Rheumatology

## 2018-03-10 NOTE — Progress Notes (Signed)
Office Visit Note  Patient: Christina Jenkins             Date of Birth: 1961/05/25           MRN: [id number]             PCP: Jalene Mullet, PA-C Referring: Jalene Mullet, PA-C Visit Date: 03/24/2018 Occupation: _0 @    Subjective:  Left trochanteric bursitis   History of Present Illness: Christina Jenkins is a 57 y.o. female with history of 0+ rheumatoid arthritis and sicca syndrome.  Patient is on Actemra subcutaneous injections every other week as well as Arava 20 mg by mouth daily.  She has been tolerating the medications well.  She reports that she has not missed any doses of her medications.  She states that she feels as though she has increased joint pain and joint swelling at the increased humidity during the summertime.  She states she is currently having discomfort in bilateral hands and bilateral feet.  She denies any joint swelling at this time.  She continues to have joint stiffness for about 30 minutes in the morning.  She would not like to make any medication changes at this time.  She reports she is having left trochanteric bursitis, and she would like a cortisone injection.   Activities of Daily Living:  Patient reports morning stiffness for 30 minutes.   Patient Reports nocturnal pain.  Difficulty dressing/grooming: Denies Difficulty climbing stairs: Denies Difficulty getting out of chair: Denies Difficulty using hands for taps, buttons, cutlery, and/or writing: Reports   Review of Systems  Constitutional: Negative for fatigue.  HENT: Negative for mouth sores, trouble swallowing, trouble swallowing, mouth dryness and nose dryness.   Eyes: Negative for pain, visual disturbance and dryness.  Respiratory: Negative for cough, hemoptysis, shortness of breath and difficulty breathing.   Cardiovascular: Negative for chest pain, palpitations, hypertension and swelling in legs/feet.  Gastrointestinal: Negative for abdominal pain, blood in stool, constipation and diarrhea.    Endocrine: Negative for increased urination.  Genitourinary: Negative for painful urination and pelvic pain.  Musculoskeletal: Positive for arthralgias, joint pain, joint swelling and morning stiffness. Negative for myalgias, muscle weakness, muscle tenderness and myalgias.  Skin: Negative for color change, pallor, rash, hair loss, nodules/bumps, skin tightness, ulcers and sensitivity to sunlight.  Allergic/Immunologic: Negative for susceptible to infections.  Neurological: Negative for dizziness, light-headedness, numbness, headaches and weakness.  Hematological: Negative for swollen glands.  Psychiatric/Behavioral: Negative for depressed mood and sleep disturbance. The patient is not nervous/anxious.     PMFS History:  Patient Active Problem List   Diagnosis Date Noted  . History of COPD 05/31/2017  . Fatigue 12/04/2016  . History of seizures 10/23/2016  . History of hepatitis C 10/23/2016  . ANA positive 10/23/2016  . Sicca syndrome, unspecified ([place]) 10/23/2016  . Seropositive rheumatoid arthritis ([place]) 08/01/2016  . High risk medication use 08/01/2016  . Hepatitis C 08/01/2016  . Tobacco abuse 08/01/2016  . Depression 08/01/2016  . Complex partial seizure ([place]) 02/02/2016  . MCI (mild cognitive impairment) 11/02/2015  . Arthralgia 11/02/2015  . Carpal tunnel syndrome 10/20/2015  . Syncope 05/17/2015  . Convulsions/seizures ([place]) 05/17/2015    Past Medical History:  Diagnosis Date  . Arthritis 02/01/16   dx w/rheumatoid arthritis  . Carpal tunnel syndrome 10/20/2015   right  . Carpal tunnel syndrome on both sides   . Dysuria   . Hypokalemia   . Liver disease    hepatitis c  .  Seasonal allergies   . Seizures ([place])    last 05/05/15    Family History  Problem Relation Age of Onset  . Pancreatic cancer Mother   . COPD Father   . Diabetes Brother    Past Surgical History:  Procedure Laterality Date  . TONSILLECTOMY     Social History   Social History Narrative   . Not on file     Objective: Vital Signs: BP 131/70 (BP Location: Left Arm, Patient Position: Sitting, Cuff Size: Normal)   Pulse 80   Resp 15   Ht _0  (1.676 m)   Wt 193 lb (87.5 kg)   BMI 31.15 kg/m    Physical Exam  Constitutional: She is oriented to person, place, and time. She appears well-developed and well-nourished.  HENT:  Head: Normocephalic and atraumatic.  Eyes: Conjunctivae and EOM are normal.  Neck: Normal range of motion.  Cardiovascular: Normal rate, regular rhythm, normal heart sounds and intact distal pulses.  Pulmonary/Chest: Effort normal and breath sounds normal.  Abdominal: Soft. Bowel sounds are normal.  Lymphadenopathy:    She has no cervical adenopathy.  Neurological: She is alert and oriented to person, place, and time.  Skin: Skin is warm and dry. Capillary refill takes less than 2 seconds.  Psychiatric: She has a normal mood and affect. Her behavior is normal.  Nursing note and vitals reviewed.    Musculoskeletal Exam: C-spine, thoracic spine, lumbar spine good range of motion.  No midline spinal tenderness.  No SI joint tenderness.  Shoulder joints, elbow joints, wrist joints, MCPs, PIPs, DIPs good range of motion with no synovitis.  She has tenderness of the right first MCP and third PIP as well as left 2nd MCP.  No synovitis was noted.  Hip joints, knee joints, ankle joints, MTPs, PIPs, DIPs good range of motion no synovitis.  No warmth or effusion of bilateral knee joints.  No Achilles tendinitis.  She has tenderness of the left trochanteric bursa on exam today.  CDAI Exam: CDAI Homunculus Exam:   Tenderness:  Right hand: 1st MCP and 3rd PIP Left hand: 2nd MCP  Joint Counts:  CDAI Tender Joint count: 3 CDAI Swollen Joint count: 0  Global Assessments:  Patient Global Assessment: 3 Provider Global Assessment: 3  CDAI Calculated Score: 9    Investigation: No additional findings. CBC    Component Value Date/Time   WBC 8.6  10/29/2017 1114   RBC 5.11 (H) 10/29/2017 1114   HGB 15.7 (H) 10/29/2017 1114   HCT 46.4 (H) 10/29/2017 1114   PLT 244 10/29/2017 1114   MCV 90.8 10/29/2017 1114   MCH 30.7 10/29/2017 1114   MCHC 33.8 10/29/2017 1114   RDW 11.6 10/29/2017 1114   LYMPHSABS 1,514 10/29/2017 1114   MONOABS 369 04/29/2017 1001   EOSABS 241 10/29/2017 1114   BASOSABS 112 10/29/2017 1114   CMP Latest Ref Rng & Units 10/29/2017 04/29/2017 02/28/2017  Glucose 65 - 99 mg/dL 93 86 75  BUN 7 - 25 mg/dL _1 Creatinine 0.50 - 1.05 mg/dL 0.99 0.99 0.87  Sodium 135 - 146 mmol/L 141 143 140  Potassium 3.5 - 5.3 mmol/L 3.7 3.8 4.3  Chloride 98 - 110 mmol/L 102 105 103  CO2 20 - 32 mmol/L _2 Calcium 8.6 - 10.4 mg/dL 9.9 9.4 9.4  Total Protein 6.1 - 8.1 g/dL 6.7 6.3 6.7  Total Bilirubin 0.2 - 1.2 mg/dL 0.5 0.5 0.3  Alkaline Phos 33 - 130 U/L -  69 102  AST 10 - 35 U/L _0 ALT 6 - 29 U/L _1 Imaging: No results found.  Speciality Comments: Need Hep C quant every 6 months    Procedures:  Large Joint Inj: L greater trochanter on 03/24/2018 10:43 AM Indications: pain Details: 27 G 1.5 in needle, lateral approach  Arthrogram: No  Medications: 1 mL lidocaine 1 %; 40 mg triamcinolone acetonide 40 MG/ML Aspirate: 0 mL Outcome: tolerated well, no immediate complications Procedure, treatment alternatives, risks and benefits explained, specific risks discussed. Consent was given by the patient. Immediately prior to procedure a time out was called to verify the correct patient, procedure, equipment, support staff and site/side marked as required. Patient was prepped and draped in the usual sterile fashion.     Allergies: Patient has no known allergies.   Assessment / Plan:     Visit Diagnoses: Rheumatoid arthritis with rheumatoid factor of multiple sites without organ or systems involvement [facility]): She has no synovitis on exam.  She has not had any recent rheumatoid arthritis flares.  She  has some tenderness of her right first MCP and third PIP as well as left second MCP joint.  She continues to inject Actemra subcutaneously every other week and takes Arava 20 mg by mouth daily.  She would not like to change her medications at this time.  A refill of [place] sent to the pharmacy today.  She is been tolerating these medications well and has not missed any doses recently.  She is advised to notify us if she develops increased joint pain or joint swelling.  She will follow-up in the office in 5 months.  High risk medication use - Actemra and Arava 20 mg daily -CBC and CMP were drawn on 02/18/2018.  She will follow-up for lab work in September and every 3 months to monitor for drug toxicity.  Standing orders are in place for [facility].  Plan: CBC with Differential/Platelet, CMP14+EGFR  Sicca syndrome, unspecified ([place]) - Improved.  No parotid swelling.  ANA positive  Trochanteric bursitis of both hips: She has intermittent trochanteric bursitis bilaterally.  Today she is tender on the left trochanteric bursa.  She requested a cortisone injection today in the office.  Indications and potential side effects were discussed.  Verbal consent was obtained.  She tolerated the procedure well.  She was given a handout of exercises that she can perform at home.  We discussed a referral to physical therapy in the future if she continues to have discomfort.  Carpal tunnel syndrome, unspecified laterality - Resolved  Tobacco abuse: The importance of tobacco cessation was discussed.  Association of heart disease with rheumatoid arthritis was discussed. Need to monitor blood pressure, cholesterol, and to exercise 30-60 minutes on daily basis was discussed. Poor dental hygiene can be a predisposing factor for rheumatoid arthritis. Good dental hygiene was discussed.  History of hepatitis C  History of seizures  History of COPD  Other fatigue    Orders: Orders Placed This Encounter  Procedures  .  Large Joint Inj  . CBC with Differential/Platelet  . CMP14+EGFR   Meds ordered this encounter  Medications  . leflunomide (ARAVA) 20 MG tablet    Sig: Take 1 tablet (20 mg total) by mouth daily.    Dispense:  30 tablet    Refill:  2    Face-to-face time spent with patient was 30 minutes. Greater than 50% of time was spent in counseling and coordination  of care.  Follow-Up Instructions: Return in about 5 months (around 08/24/2018) for Rheumatoid arthritis, Sicca syndrome .   Ofilia Neas, PA-C   I examined and evaluated the patient with Hazel Sams PA.  Patient gives history of intermittent joint pain and swelling.  We did not notice any synovitis on examination today.  She will continue on current regimen.  She also had discomfort in her trochanter area cortisone injection and tolerated well.  The plan of care was discussed as noted above.  Bo Merino, MD  Note - This record has been created using Editor, commissioning.  Chart creation errors have been sought, but may not always  have been located. Such creation errors do not reflect on  the standard of medical care.

## 2018-03-10 NOTE — Telephone Encounter (Signed)
Last visit: 10/29/2017 Next visit: 03/24/2018 Labs: 10/29/2017 RBC, hgb, hct are elevated. We will continue to monitor. CMP WNL. Tb gold: 10/29/2017 Negative   Okay to refill per Dr. Corliss Skains.

## 2018-03-13 ENCOUNTER — Telehealth: Payer: Self-pay | Admitting: Pharmacy Technician

## 2018-03-13 NOTE — Telephone Encounter (Signed)
Received a Prior Authorization request from [facility] for Actemra. Authorization has been submitted to patient's insurance via Cover My Meds. Will update once we receive a response.  9:27 AM  Dorthula Nettles, CPhT

## 2018-03-20 ENCOUNTER — Telehealth (INDEPENDENT_AMBULATORY_CARE_PROVIDER_SITE_OTHER): Payer: Self-pay | Admitting: Radiology

## 2018-03-20 NOTE — Telephone Encounter (Signed)
actemra 162mg /0.41ml  Approvedon July 11  Request Reference Number: July 13. ACTEMRA INJ 162/0.9 is approved through 03/14/2019. For further questions, call [phone].

## 2018-03-24 ENCOUNTER — Encounter: Payer: Self-pay | Admitting: Physician Assistant

## 2018-03-24 ENCOUNTER — Ambulatory Visit: Payer: 59 | Admitting: Rheumatology

## 2018-03-24 VITALS — BP 131/70 | HR 80 | Resp 15 | Ht 66.0 in | Wt 193.0 lb

## 2018-03-24 DIAGNOSIS — M7062 Trochanteric bursitis, left hip: Secondary | ICD-10-CM

## 2018-03-24 DIAGNOSIS — R768 Other specified abnormal immunological findings in serum: Secondary | ICD-10-CM

## 2018-03-24 DIAGNOSIS — Z720 Tobacco use: Secondary | ICD-10-CM

## 2018-03-24 DIAGNOSIS — M0579 Rheumatoid arthritis with rheumatoid factor of multiple sites without organ or systems involvement: Secondary | ICD-10-CM | POA: Diagnosis not present

## 2018-03-24 DIAGNOSIS — M7061 Trochanteric bursitis, right hip: Secondary | ICD-10-CM

## 2018-03-24 DIAGNOSIS — Z8709 Personal history of other diseases of the respiratory system: Secondary | ICD-10-CM

## 2018-03-24 DIAGNOSIS — M3500 Sicca syndrome, unspecified: Secondary | ICD-10-CM | POA: Diagnosis not present

## 2018-03-24 DIAGNOSIS — R5383 Other fatigue: Secondary | ICD-10-CM

## 2018-03-24 DIAGNOSIS — Z79899 Other long term (current) drug therapy: Secondary | ICD-10-CM | POA: Diagnosis not present

## 2018-03-24 DIAGNOSIS — Z8619 Personal history of other infectious and parasitic diseases: Secondary | ICD-10-CM

## 2018-03-24 DIAGNOSIS — G5600 Carpal tunnel syndrome, unspecified upper limb: Secondary | ICD-10-CM

## 2018-03-24 DIAGNOSIS — Z87898 Personal history of other specified conditions: Secondary | ICD-10-CM

## 2018-03-24 MED ORDER — TRIAMCINOLONE ACETONIDE 40 MG/ML IJ SUSP
40.0000 mg | INTRAMUSCULAR | Status: AC | PRN
  Administered 2018-03-24: 40 mg via INTRA_ARTICULAR

## 2018-03-24 MED ORDER — LEFLUNOMIDE 20 MG PO TABS: 20.0000 mg | ORAL_TABLET | Freq: Every day | ORAL | 2 refills | Status: DC

## 2018-03-24 MED ORDER — LIDOCAINE HCL 1 % IJ SOLN
1.0000 mL | INTRAMUSCULAR | Status: AC | PRN
  Administered 2018-03-24: 1 mL

## 2018-03-24 NOTE — Patient Instructions (Addendum)
Standing Labs We placed an order today for your standing lab work.    Please come back and get your standing labs in September and every 3 months  We have open lab Monday through Friday from 8:30-11:30 AM and 1:30-4:00 PM  at the office of Dr. Pollyann Savoy.   You may experience shorter wait times on Monday and Friday afternoons. The office is located at [address], [address], [place], [place] [address] No appointment is necessary.   Labs are drawn by [facility].  You may receive a bill from [facility] for your lab work. If you have any questions regarding directions or hours of operation,  please call [phone].    Trochanteric Bursitis Rehab Ask your health care provider which exercises are safe for you. Do exercises exactly as told by your health care provider and adjust them as directed. It is normal to feel mild stretching, pulling, tightness, or discomfort as you do these exercises, but you should stop right away if you feel sudden pain or your pain gets worse.Do not begin these exercises until told by your health care provider. Stretching exercises These exercises warm up your muscles and joints and improve the movement and flexibility of your hip. These exercises also help to relieve pain and stiffness. Exercise A: Iliotibial band stretch  1. Lie on your side with your left / right leg in the top position. 2. Bend your left / right knee and grab your ankle. 3. Slowly bring your knee back so your thigh is behind your body. 4. Slowly lower your knee toward the floor until you feel a gentle stretch on the outside of your left / right thigh. If you do not feel a stretch and your knee will not fall farther, place the heel of your other foot on top of your outer knee and pull your thigh down farther. 5. Hold this position for __________ seconds. 6. Slowly return to the starting position. Repeat __________ times. Complete this exercise __________ times a day. Strengthening  exercises These exercises build strength and endurance in your hip and pelvis. Endurance is the ability to use your muscles for a long time, even after they get tired. Exercise B: Bridge ( hip extensors) 1. Lie on your back on a firm surface with your knees bent and your feet flat on the floor. 2. Tighten your buttocks muscles and lift your buttocks off the floor until your trunk is level with your thighs. You should feel the muscles working in your buttocks and the back of your thighs. If this exercise is too easy, try doing it with your arms crossed over your chest. 3. Hold this position for __________ seconds. 4. Slowly return to the starting position. 5. Let your muscles relax completely between repetitions. Repeat __________ times. Complete this exercise __________ times a day. Exercise C: Squats ( knee extensors and  quadriceps) 1. Stand in front of a table, with your feet and knees pointing straight ahead. You may rest your hands on the table for balance but not for support. 2. Slowly bend your knees and lower your hips like you are going to sit in a chair. ? Keep your weight over your heels, not over your toes. ? Keep your lower legs upright so they are parallel with the table legs. ? Do not let your hips go lower than your knees. ? Do not bend lower than told by your health care provider. ? If your hip pain increases, do not bend as low. 3. Hold  this position for __________ seconds. 4. Slowly push with your legs to return to standing. Do not use your hands to pull yourself to standing. Repeat __________ times. Complete this exercise __________ times a day. Exercise D: Hip hike 1. Stand sideways on a bottom step. Stand on your left / right leg with your other foot unsupported next to the step. You can hold onto the railing or wall if needed for balance. 2. Keeping your knees straight and your torso square, lift your left / right hip up toward the ceiling. 3. Hold this position for  __________ seconds. 4. Slowly let your left / right hip lower toward the floor, past the starting position. Your foot should get closer to the floor. Do not lean or bend your knees. Repeat __________ times. Complete this exercise __________ times a day. Exercise E: Single leg stand 1. Stand near a counter or door frame that you can hold onto for balance as needed. It is helpful to stand in front of a mirror for this exercise so you can watch your hip. 2. Squeeze your left / right buttock muscles then lift up your other foot. Do not let your left / right hip push out to the side. 3. Hold this position for __________ seconds. Repeat __________ times. Complete this exercise __________ times a day. This information is not intended to replace advice given to you by your health care provider. Make sure you discuss any questions you have with your health care provider. Document Released: 09/27/2004 Document Revised: 04/26/2016 Document Reviewed: 08/05/2015 Elsevier Interactive Patient Education  [employer].

## 2018-04-04 ENCOUNTER — Telehealth: Payer: Self-pay | Admitting: Rheumatology

## 2018-04-04 NOTE — Telephone Encounter (Signed)
Patient had labs done last week with GP. Per patient Cholesterol was high. GP wanted patient to find out if Dr. Corliss Skains recommended putting her on medication for this? Could current medication be causing this? Patient does not want to switch current medication since it is working. Please call to discuss.

## 2018-04-04 NOTE — Telephone Encounter (Signed)
Actemra does cause elevation of lipid levels.  Her fasting lipid level should be monitored every 6 months.  She will need medications to control her lipid levels by her PCP.

## 2018-04-04 NOTE — Telephone Encounter (Signed)
Called patient and advised her of information below. Patient verbalized understanding.

## 2018-05-28 ENCOUNTER — Other Ambulatory Visit: Payer: Self-pay | Admitting: Rheumatology

## 2018-05-28 NOTE — Telephone Encounter (Addendum)
Last Visit: 03/24/18 Next Visit: 08/28/18 Labs: 02/18/18 WNL TB Gold: 10/29/17 Neg   Patient advised she is due to update labs. Patient states she update labs at [facility] and they will be faxing results.   Okay to refill 30 day supply per Dr. Corliss Skains

## 2018-06-19 ENCOUNTER — Telehealth: Payer: Self-pay | Admitting: Rheumatology

## 2018-06-19 ENCOUNTER — Other Ambulatory Visit: Payer: Self-pay | Admitting: Rheumatology

## 2018-06-19 NOTE — Telephone Encounter (Signed)
Patient advised we only received CMP. Patient will call [facility] and have them send cbc.

## 2018-06-19 NOTE — Telephone Encounter (Signed)
Patient left a voicemail stating she was returning your call.   

## 2018-06-19 NOTE — Telephone Encounter (Addendum)
Last Visit: 03/24/18 Next Visit: 08/28/18 Labs: 02/18/18 WNL TB Gold: 10/29/17 Neg   Left message to advise patient she is due for labs.  Okay to refill 30 day supply per Dr. Corliss Skains

## 2018-06-26 ENCOUNTER — Encounter (HOSPITAL_COMMUNITY): Payer: Self-pay

## 2018-06-26 ENCOUNTER — Other Ambulatory Visit: Payer: Self-pay

## 2018-06-26 ENCOUNTER — Emergency Department (HOSPITAL_COMMUNITY)
Admission: EM | Admit: 2018-06-26 | Discharge: 2018-06-26 | Disposition: A | Discharge status: Home / Self Care | Payer: Commercial Managed Care - HMO | Attending: Emergency Medicine | Admitting: Emergency Medicine

## 2018-06-26 DIAGNOSIS — Y939 Activity, unspecified: Secondary | ICD-10-CM | POA: Insufficient documentation

## 2018-06-26 DIAGNOSIS — Y999 Unspecified external cause status: Secondary | ICD-10-CM | POA: Insufficient documentation

## 2018-06-26 DIAGNOSIS — S0101XA Laceration without foreign body of scalp, initial encounter: Secondary | ICD-10-CM | POA: Insufficient documentation

## 2018-06-26 DIAGNOSIS — W1839XA Other fall on same level, initial encounter: Secondary | ICD-10-CM | POA: Insufficient documentation

## 2018-06-26 DIAGNOSIS — Z23 Encounter for immunization: Secondary | ICD-10-CM | POA: Insufficient documentation

## 2018-06-26 DIAGNOSIS — F17210 Nicotine dependence, cigarettes, uncomplicated: Secondary | ICD-10-CM | POA: Insufficient documentation

## 2018-06-26 DIAGNOSIS — Y929 Unspecified place or not applicable: Secondary | ICD-10-CM | POA: Insufficient documentation

## 2018-06-26 DIAGNOSIS — J449 Chronic obstructive pulmonary disease, unspecified: Secondary | ICD-10-CM | POA: Insufficient documentation

## 2018-06-26 DIAGNOSIS — R569 Unspecified convulsions: Secondary | ICD-10-CM | POA: Insufficient documentation

## 2018-06-26 DIAGNOSIS — Z79899 Other long term (current) drug therapy: Secondary | ICD-10-CM | POA: Insufficient documentation

## 2018-06-26 LAB — CBC WITH DIFFERENTIAL/PLATELET
Abs Immature Granulocytes: 0.01 10*3/uL (ref 0.00–0.07)
Basophils Absolute: 0.1 10*3/uL (ref 0.0–0.1)
Basophils Relative: 2 %
Eosinophils Absolute: 0.2 10*3/uL (ref 0.0–0.5)
Eosinophils Relative: 3 %
HCT: 46.1 % — ABNORMAL HIGH (ref 36.0–46.0)
Hemoglobin: 14.8 g/dL (ref 12.0–15.0)
Immature Granulocytes: 0 %
Lymphocytes Relative: 28 %
Lymphs Abs: 1.7 10*3/uL (ref 0.7–4.0)
MCH: 30.3 pg (ref 26.0–34.0)
MCHC: 32.1 g/dL (ref 30.0–36.0)
MCV: 94.5 fL (ref 80.0–100.0)
Monocytes Absolute: 0.8 10*3/uL (ref 0.1–1.0)
Monocytes Relative: 14 %
Neutro Abs: 3.3 10*3/uL (ref 1.7–7.7)
Neutrophils Relative %: 53 %
Platelets: 179 10*3/uL (ref 150–400)
RBC: 4.88 MIL/uL (ref 3.87–5.11)
RDW: 11.9 % (ref 11.5–15.5)
WBC: 6.1 10*3/uL (ref 4.0–10.5)
nRBC: 0 % (ref 0.0–0.2)

## 2018-06-26 LAB — BASIC METABOLIC PANEL
Anion gap: 12 (ref 5–15)
BUN: 10 mg/dL (ref 6–20)
CO2: 24 mmol/L (ref 22–32)
Calcium: 9.1 mg/dL (ref 8.9–10.3)
Chloride: 105 mmol/L (ref 98–111)
Creatinine, Ser: 1 mg/dL (ref 0.44–1.00)
GFR calc Af Amer: >60 mL/min (ref >60)
GFR calc non Af Amer: >60 mL/min (ref >60)
Glucose, Bld: 137 mg/dL — ABNORMAL HIGH (ref 70–99)
Potassium: 3.2 mmol/L — ABNORMAL LOW (ref 3.5–5.1)
Sodium: 141 mmol/L (ref 135–145)

## 2018-06-26 MED ORDER — SODIUM CHLORIDE 0.9 % IV BOLUS
500.0000 mL | Freq: Once | INTRAVENOUS | Status: AC
  Administered 2018-06-26: 500 mL via INTRAVENOUS

## 2018-06-26 MED ORDER — TETANUS-DIPHTH-ACELL PERTUSSIS 5-2.5-18.5 LF-MCG/0.5 IM SUSP
0.5000 mL | Freq: Once | INTRAMUSCULAR | Status: AC
  Administered 2018-06-26: 0.5 mL via INTRAMUSCULAR
  Filled 2018-06-26: qty 0.5

## 2018-06-26 MED ORDER — LEVETIRACETAM ER 500 MG PO TB24
1000.0000 mg | ORAL_TABLET | Freq: Every day | ORAL | Status: DC
  Administered 2018-06-26: 1000 mg via ORAL
  Filled 2018-06-26 (×2): qty 2

## 2018-06-26 MED ORDER — LEVETIRACETAM ER 500 MG PO TB24: 500.0000 mg | ORAL_TABLET | Freq: Every day | ORAL | 0 refills | Status: AC

## 2018-06-26 NOTE — ED Notes (Signed)
Am paging pharmacy again for Keppra

## 2018-06-26 NOTE — ED Provider Notes (Signed)
[facility] EMERGENCY DEPARTMENT Provider Note   CSN: [id number] Arrival date & time: 06/26/18  1004     History   Chief Complaint Chief Complaint  Patient presents with  . Seizures  . Laceration    HPI DEVYNE HAUGER is a 57 y.o. female.  HPI   57 year old female with seizure.  Happened shortly before arrival.  Patient is amnestic to the events.  She was waking up on the ground.  She did strike her head and has a laceration to her posterior scalp.  Bystanders reported seizure-like activity.  She appeared to be postictal on EMS arrival with progressive improvement in her mental status.  Back to her baseline per husband at bedside at the time I evaluated her.  She has a past history of seizures.  She is prescribed Keppra but has not taken any in several days.  She was in her usual state of health earlier in the day.  Past Medical History:  Diagnosis Date  . Arthritis 02/01/16   dx w/rheumatoid arthritis  . Carpal tunnel syndrome 10/20/2015   right  . Carpal tunnel syndrome on both sides   . Dysuria   . Hypokalemia   . Liver disease    hepatitis c  . Seasonal allergies   . Seizures (HCC)    last 05/05/15    Patient Active Problem List   Diagnosis Date Noted  . History of COPD 05/31/2017  . Fatigue 12/04/2016  . History of seizures 10/23/2016  . History of hepatitis C 10/23/2016  . ANA positive 10/23/2016  . Sicca syndrome, unspecified (HCC) 10/23/2016  . Seropositive rheumatoid arthritis (HCC) 08/01/2016  . High risk medication use 08/01/2016  . Hepatitis C 08/01/2016  . Tobacco abuse 08/01/2016  . Depression 08/01/2016  . Complex partial seizure (HCC) 02/02/2016  . MCI (mild cognitive impairment) 11/02/2015  . Arthralgia 11/02/2015  . Carpal tunnel syndrome 10/20/2015  . Syncope 05/17/2015  . Convulsions/seizures (HCC) 05/17/2015    Past Surgical History:  Procedure Laterality Date  . TONSILLECTOMY       OB History   None      Home Medications     Prior to Admission medications   Medication Sig Start Date End Date Taking? Authorizing Provider  acetaminophen (TYLENOL) 325 MG tablet Take 650 mg by mouth daily as needed.     [provider]  ACTEMRA 162 MG/0.9ML SOSY INJECT SUBCUTANEOUSLY 162MG  EVERY OTHER WEEK 06/19/18   06/21/18, MD  ARIPiprazole (ABILIFY) 10 MG tablet Take 10 mg by mouth daily.    [provider]  DULoxetine (CYMBALTA) 30 MG capsule Take 90 mg by mouth daily.  02/11/17   [provider]  furosemide (LASIX) 20 MG tablet take 1 tablet by mouth every morning if needed for SWELLING 05/10/15   [provider]  leflunomide (ARAVA) 20 MG tablet Take 1 tablet (20 mg total) by mouth daily. 03/24/18   03/26/18, PA-C  levETIRAcetam (KEPPRA XR) 500 MG 24 hr tablet Take 2 tablets (1,000 mg total) by mouth daily. 12/04/16   02/03/17, NP  mometasone (ELOCON) 0.1 % ointment apply to affected area once daily ---TO LOWER LEGS UNDER ACE WRAPS 01/06/16   [provider]  Potassium Chloride ER 20 MEQ TBCR  02/15/17   [provider]  triamcinolone ointment (KENALOG) 0.5 % On legs 11/06/16   [provider]    Family History Family History  Problem Relation Age of Onset  . Pancreatic cancer Mother   .  COPD Father   . Diabetes Brother     Social History Social History   Tobacco Use  . Smoking status: Current Every Day Smoker    Packs/day: 1.00    Types: Cigarettes  . Smokeless tobacco: Never Used  Substance Use Topics  . Alcohol use: No  . Drug use: No     Allergies   Patient has no known allergies.   Review of Systems Review of Systems  All systems reviewed and negative, other than as noted in HPI.  Physical Exam Updated Vital Signs BP (!) 139/57 (BP Location: Right Arm)   Pulse (!) 111   Temp 98.1 F (36.7 C) (Oral)   Resp 11   Wt 84.4 kg   SpO2 96%   BMI 30.02 kg/m    Physical Exam  Constitutional: She is oriented to  person, place, and time. She appears well-developed and well-nourished. No distress.  HENT:  Head: Normocephalic.  Approximately 3 cm laceration to the posterior scalp.  No active bleeding.  Small underlying hematoma.  No significant bony tenderness.  No palpable skull defect.  No midline spinal tenderness.  Eyes: Conjunctivae are normal. Right eye exhibits no discharge. Left eye exhibits no discharge.  Neck: Neck supple.  Cardiovascular: Normal rate, regular rhythm and normal heart sounds. Exam reveals no gallop and no friction rub.  No murmur heard. Pulmonary/Chest: Effort normal. No respiratory distress. She has no rales.  Abdominal: Soft. She exhibits no distension. There is no tenderness.  Musculoskeletal: She exhibits no edema or tenderness.  Neurological: She is alert and oriented to person, place, and time. No cranial nerve deficit. She exhibits normal muscle tone. Coordination normal.  Skin: Skin is warm and dry.  Psychiatric: She has a normal mood and affect. Her behavior is normal. Thought content normal.  Nursing note and vitals reviewed.    ED Treatments / Results  Labs (all labs ordered are listed, but only abnormal results are displayed) Labs Reviewed  CBC WITH DIFFERENTIAL/PLATELET - Abnormal; Notable for the following components:      Result Value   HCT 46.1 (*)    All other components within normal limits  BASIC METABOLIC PANEL - Abnormal; Notable for the following components:   Potassium 3.2 (*)    Glucose, Bld 137 (*)    All other components within normal limits    EKG EKG Interpretation  Date/Time:  Thursday June 26 2018 10:07:52 EDT Ventricular Rate:  111 PR Interval:    QRS Duration: 130 QT Interval:  368 QTC Calculation: 501 R Axis:   83 Text Interpretation:  Sinus tachycardia Right atrial enlargement Right bundle branch block Minimal ST elevation, lateral leads Confirmed by Raeford Razor [id number]) on 06/26/2018 10:26:56 AM   Radiology No  results found.  Procedures Procedures (including critical care time)  LACERATION REPAIR Performed by: Raeford Razor Authorized by: Raeford Razor Consent: Verbal consent obtained. Risks and benefits: risks, benefits and alternatives were discussed Consent given by: patient Patient identity confirmed: provided demographic data Prepped and Draped in normal sterile fashion Wound explored  Laceration Location: scalp  Laceration Length: 3 cm  No Foreign Bodies seen or palpated  Anesthesia: local infiltration  Local anesthetic: none  Anesthetic total: n/a  Irrigation method: syringe Amount of cleaning: standard  Skin closure: stapled  Number of sutures: 3  Patient tolerance: Patient tolerated the procedure well with no immediate complications.   Medications Ordered in ED Medications - No data to display   Initial Impression / Assessment and Plan /  ED Course  I have reviewed the triage vital signs and the nursing notes.  Pertinent labs & imaging results that were available during my care of the patient were reviewed by me and considered in my medical decision making (see chart for details).     57 year old female with witnessed seizure. Back to baseline.  Scalp laceration repaired.  Her neuro exam is nonfocal.  She has a history of seizures and has been noncompliant with her medications.  Check basic labs.  Keppra. Update tetanus.   Labs are unremarkable aside from mild hypokalemia which is likely noncontributory.  She was provided with a prescription for Keppra.  Final Clinical Impressions(s) / ED Diagnoses   Final diagnoses:  Seizure (HCC)  Laceration of scalp, initial encounter    ED Discharge Orders    None      Raeford Razor, MD 06/26/18 1116

## 2018-06-26 NOTE — ED Notes (Signed)
Pharmacy bringing down pt's Keppra

## 2018-06-26 NOTE — ED Triage Notes (Addendum)
[place]. Pt reports that she has not taken keppra since Monday due to husband losing job. Pt had seizure and fell back and hit back of head . Pt post ictal upon EMS arival. Pt alert and answering appropriately. Reports last seizure 2 years ago

## 2018-06-26 NOTE — ED Notes (Signed)
EDP placed 3 staples in back of head

## 2018-06-27 ENCOUNTER — Telehealth: Payer: Self-pay | Admitting: *Deleted

## 2018-06-27 NOTE — Telephone Encounter (Signed)
C-Met received drawn on 06/02/18 BUN-8 previous 12 Sodium 145 previous 144 Potassium 3.4 previous 3.6 Total protein 5.9 previous 5.7    Stable  Patient on Actemra and [place]

## 2018-07-04 ENCOUNTER — Other Ambulatory Visit: Payer: Self-pay | Admitting: Physician Assistant

## 2018-07-04 NOTE — Telephone Encounter (Signed)
I see a recent CBC and BMP in the chart.  I do not see AST and ALT.  Okay to give 30-day supply.  Please advise patient to come in for CMP.

## 2018-07-08 ENCOUNTER — Telehealth: Payer: Self-pay | Admitting: *Deleted

## 2018-07-08 NOTE — Telephone Encounter (Signed)
Spoke to patient, she will be switching to Medicare effective Jan 1. She will have coverage up until then. She will come to office to complete patient assistance application for 2020 when she picks up her sample.  3:03 PM Dorthula Nettles, CPhT

## 2018-07-08 NOTE — Telephone Encounter (Signed)
Patient's husband has been laid of and she is on actemra. Patient will need patient assistance. Will you please check in to this and see if there is a program and if she will qualify. Patient has been offered a sample and will come by the office to pick up.

## 2018-07-08 NOTE — Telephone Encounter (Signed)
C-Met received drawn on 06/02/18 BUN-8 previous 12 Sodium 145 previous 144 Potassium 3.4 previous 3.6 Total protein 5.9 previous 5.7    Stable   Last Visit: 03/24/18 Next Visit: 08/28/18  Okay to refill per Dr. Estanislado Pandy

## 2018-07-08 NOTE — Telephone Encounter (Signed)
Received a fax from [employer] regarding a prior authorization for Actemra. Authorization has been APPROVED from 03/13/18 to 03/14/19.   Will send document to scan center.  Authorization # [id number] Phone # [phone]  Dorthula Nettles, CPhT

## 2018-07-14 NOTE — Telephone Encounter (Signed)
Patient called and said she will come by tomorrow to sign patient assistance application and to pick up a sample.

## 2018-07-15 ENCOUNTER — Telehealth: Payer: Self-pay | Admitting: *Deleted

## 2018-07-15 ENCOUNTER — Telehealth: Payer: Self-pay | Admitting: Pharmacy Technician

## 2018-07-15 MED ORDER — PREDNISONE 5 MG PO TABS: ORAL_TABLET | ORAL | 0 refills | Status: DC

## 2018-07-15 NOTE — Telephone Encounter (Signed)
Medication Samples have been provided to the patient.  Drug name: Actemra  Strength: 162mg /0.2ml      Qty: 2 LOT: 93m   Exp.Date: 10/2018  Dosing instructions: Inject under the skin every 14 days.  The patient has been instructed regarding the correct time, dose, and frequency of taking this medication, including desired effects and most common side effects.   Kelson Queenan C Maylene Crocker 11:30 AM 07/15/2018

## 2018-07-15 NOTE — Telephone Encounter (Signed)
Patient came to the office to pick up samples of Actemra. Patient states she is having pain in her hips since 07/10/18. Patient states she is also having stiffness in her hands. She is unable to make an appointment at this time due to not having insurance. Patient will be able to follow up after the first of the year. Patient has an appointment 09/09/18. Patient is on [place] and Actemra. Patient is requesting Prednisone.

## 2018-07-15 NOTE — Telephone Encounter (Signed)
Spoke with patient and advised patient prescription sent to the pharmacy.

## 2018-07-15 NOTE — Telephone Encounter (Signed)
Application has been faxed. Awaiting response.

## 2018-07-15 NOTE — Telephone Encounter (Signed)
Patient came in to sign patient assistance application for Actemra. Patient confirmed that they elected to not enroll in [place] coverage. Patient said she will have Medicare as of January 2019. Patient was given 2 samples. Will follow up once we receive a response.  11:24 AM Dorthula Nettles, CPhT

## 2018-07-15 NOTE — Telephone Encounter (Signed)
Ok to give Pred taper, starting at 20 mg po qd and taper by 5 mg po q 4 days

## 2018-07-18 NOTE — Telephone Encounter (Signed)
Received fax from [place] that patient was APPROVED for patient assistance for Actemra. Patient is approved as long as she is eligible for the program.  Will send document to scan center.  Called patient to advise.  10:09 AM Dorthula Nettles, CPhT

## 2018-08-03 ENCOUNTER — Other Ambulatory Visit: Payer: Self-pay | Admitting: Rheumatology

## 2018-08-04 NOTE — Telephone Encounter (Signed)
Last Visit: 03/24/18 Next Visit: 08/28/18 C-Met received drawn on 06/02/18 stable 06/26/18 cbc stable  Okay to refill per Dr. Estanislado Pandy

## 2018-08-28 ENCOUNTER — Ambulatory Visit: Payer: 59 | Admitting: Physician Assistant

## 2018-08-29 NOTE — Progress Notes (Signed)
Office Visit Note  Patient: Christina Jenkins             Date of Birth: [birth year]           MRN: [id number]             PCP: Encarnacion Slates, PA-C Referring: Encarnacion Slates, PA-C Visit Date: 09/09/2018 Occupation: @GUAROCC @  Subjective:  Pain in hands and hips.  Actemra and Arava.  Last TB gold negative on 10/29/2017 in future order placed for next month.  Last lipid panel showed elevated LDL on 04/29/2017 and currently on Crestor 10 mg.  Due for updated lipid panel today.  Most recent CBC/BMP from hospital encounter within normal limits except low potassium on 06/26/2018.  Will monitor CBC/CMP every 3 months and standing orders in place.  Patient had an annual flu and Shingrix vaccine.  Recommend Pneumovax 23 and Prevnar 13 as indicated.  History of Present Illness: Christina Jenkins is a 57 y.o. female with history of seropositive rheumatoid arthritis.  Patient states that she was off her Arava due to insurance issues for the last 3 months.  She started experiencing increased joint pain and discomfort.  She restarted her Arava this month.  And the symptoms are better.  She continues to have some discomfort in her both hands and her both hips.  Sicca symptoms have improved.  Bilateral trochanteric bursa pain persist.  She has not taken prednisone taper since October 2019.  Activities of Daily Living:  Patient reports morning stiffness for 30 minutes.   Patient Reports nocturnal pain.  Difficulty dressing/grooming: Denies Difficulty climbing stairs: Denies Difficulty getting out of chair: Denies Difficulty using hands for taps, buttons, cutlery, and/or writing: Reports  Review of Systems  Constitutional: Negative for fatigue, night sweats, weight gain and weight loss.  HENT: Negative for mouth sores, nosebleeds, trouble swallowing, trouble swallowing, mouth dryness and nose dryness.   Eyes: Negative for pain, redness, itching, visual disturbance and dryness.  Respiratory: Negative for cough,  hemoptysis, shortness of breath, wheezing and difficulty breathing.   Cardiovascular: Negative for chest pain, palpitations, hypertension, irregular heartbeat and swelling in legs/feet.  Gastrointestinal: Negative for abdominal pain, blood in stool, constipation and diarrhea.  Endocrine: Negative for increased urination.  Genitourinary: Negative for painful urination, nocturia, pelvic pain and vaginal dryness.  Musculoskeletal: Positive for arthralgias, joint pain and morning stiffness. Negative for joint swelling, myalgias, muscle weakness, muscle tenderness and myalgias.  Skin: Negative for color change, rash, hair loss, skin tightness, ulcers and sensitivity to sunlight.  Allergic/Immunologic: Negative for susceptible to infections.  Neurological: Negative for dizziness, light-headedness, headaches, memory loss, night sweats and weakness.  Hematological: Negative for bruising/bleeding tendency and swollen glands.  Psychiatric/Behavioral: Negative for depressed mood, confusion and sleep disturbance. The patient is not nervous/anxious.     PMFS History:  Patient Active Problem List   Diagnosis Date Noted  . History of COPD 05/31/2017  . Fatigue 12/04/2016  . History of seizures 10/23/2016  . History of hepatitis C 10/23/2016  . ANA positive 10/23/2016  . Sicca syndrome, unspecified (HCC) 10/23/2016  . Seropositive rheumatoid arthritis (HCC) 08/01/2016  . High risk medication use 08/01/2016  . Hepatitis C 08/01/2016  . Tobacco abuse 08/01/2016  . Depression 08/01/2016  . Complex partial seizure (HCC) 02/02/2016  . MCI (mild cognitive impairment) 11/02/2015  . Arthralgia 11/02/2015  . Carpal tunnel syndrome 10/20/2015  . Syncope 05/17/2015  . Convulsions/seizures (HCC) 05/17/2015    Past Medical History:  Diagnosis Date  .  Arthritis 02/01/16   dx w/rheumatoid arthritis  . Carpal tunnel syndrome 10/20/2015   right  . Carpal tunnel syndrome on both sides   . Dysuria   .  Hypokalemia   . Liver disease    hepatitis c  . Seasonal allergies   . Seizures (HCC)    last 05/05/15    Family History  Problem Relation Age of Onset  . Pancreatic cancer Mother   . COPD Father   . Diabetes Brother    Past Surgical History:  Procedure Laterality Date  . TONSILLECTOMY     Social History   Social History Narrative  . Not on file   Immunization History  Administered Date(s) Administered  . Influenza,inj,Quad PF,6+ Mos 05/16/2017, 05/15/2018  . Tdap 06/26/2018  . Zoster Recombinat (Shingrix) 09/04/2017    Objective: Vital Signs: BP 128/76 (BP Location: Left Arm, Patient Position: Sitting, Cuff Size: Normal)   Pulse 91   Resp 14   Ht 5\' 6"  (1.676 m)   Wt 212 lb 9.6 oz (96.4 kg)   BMI 34.31 kg/m    Physical Exam Vitals signs and nursing note reviewed.  Constitutional:      Appearance: She is well-developed.  HENT:     Head: Normocephalic and atraumatic.  Eyes:     Conjunctiva/sclera: Conjunctivae normal.  Neck:     Musculoskeletal: Normal range of motion.  Cardiovascular:     Rate and Rhythm: Normal rate and regular rhythm.     Heart sounds: Normal heart sounds.  Pulmonary:     Effort: Pulmonary effort is normal.     Breath sounds: Normal breath sounds.  Abdominal:     General: Bowel sounds are normal.     Palpations: Abdomen is soft.  Lymphadenopathy:     Cervical: No cervical adenopathy.  Skin:    General: Skin is warm and dry.     Capillary Refill: Capillary refill takes less than 2 seconds.  Neurological:     Mental Status: She is alert and oriented to person, place, and time.  Psychiatric:        Behavior: Behavior normal.      Musculoskeletal Exam: C-spine thoracic and lumbar spine good range of motion.  Shoulder joints elbow joints wrist joint MCPs PIPs DIPs been good range of motion with no synovitis.  Hip joints knee joints ankles MTPs PIPs were in good range of motion with no synovitis.  She had tenderness over bilateral  trochanteric bursa consistent with trochanteric bursitis.  CDAI Exam: CDAI Score: 0.6  Patient Global Assessment: 3 (mm); Provider Global Assessment: 3 (mm) Swollen: 0 ; Tender: 0  Joint Exam   Not documented   There is currently no information documented on the homunculus. Go to the Rheumatology activity and complete the homunculus joint exam.  Investigation: No additional findings.  Imaging: No results found.  Recent Labs: Lab Results  Component Value Date   WBC 6.1 06/26/2018   HGB 14.8 06/26/2018   PLT 179 06/26/2018   NA 141 06/26/2018   K 3.2 (L) 06/26/2018   CL 105 06/26/2018   CO2 24 06/26/2018   GLUCOSE 137 (H) 06/26/2018   BUN 10 06/26/2018   CREATININE 1.00 06/26/2018   BILITOT 0.5 10/29/2017   ALKPHOS 69 04/29/2017   AST 21 10/29/2017   ALT 22 10/29/2017   PROT 6.7 10/29/2017   ALBUMIN 4.2 04/29/2017   CALCIUM 9.1 06/26/2018   GFRAA >60 06/26/2018   QFTBGOLDPLUS NEGATIVE 10/29/2017    Speciality Comments: Need Hep  C quant every 6 months  Procedures:  No procedures performed Allergies: Patient has no known allergies.   Assessment / Plan:     Visit Diagnoses: Rheumatoid arthritis with rheumatoid factor of multiple sites without organ or systems involvement (HCC)-patient has no synovitis on examination today.  She states she has been having some discomfort in her joints.  She recently restarted Arava which is been helpful.  Patient requested prednisone taper but I do not see any synovitis on examination.  High risk medication use - Actemra subcutaneously every other week and takes Arava 20 mg by mouth daily.   - Plan: Lipid panel, QuantiFERON-TB Gold Plus, CBC with Differential/Platelet, COMPLETE METABOLIC PANEL WITH GFR  Sicca syndrome, unspecified (HCC-her sicca symptoms are manageable with over-the-counter medications.  ANA positive  Trochanteric bursitis of both hips-advised to continue with IT band exercises.  Tobacco abuse-smoking cessation  was discussed at length.  History of seizures  History of COPD   Orders: Orders Placed This Encounter  Procedures  . Lipid panel  . QuantiFERON-TB Gold Plus  . CBC with Differential/Platelet  . COMPLETE METABOLIC PANEL WITH GFR   No orders of the defined types were placed in this encounter.   .  Follow-Up Instructions: Return in about 5 months (around 02/08/2019) for Rheumatoid arthritis.   Pollyann Savoy, MD  Note - This record has been created using Animal nutritionist.  Chart creation errors have been sought, but may not always  have been located. Such creation errors do not reflect on  the standard of medical care.

## 2018-09-09 ENCOUNTER — Ambulatory Visit: Payer: Medicare Other | Admitting: Rheumatology

## 2018-09-09 ENCOUNTER — Encounter: Payer: Self-pay | Admitting: Physician Assistant

## 2018-09-09 VITALS — BP 128/76 | HR 91 | Resp 14 | Ht 66.0 in | Wt 212.6 lb

## 2018-09-09 DIAGNOSIS — Z79899 Other long term (current) drug therapy: Secondary | ICD-10-CM

## 2018-09-09 DIAGNOSIS — Z87898 Personal history of other specified conditions: Secondary | ICD-10-CM

## 2018-09-09 DIAGNOSIS — M0579 Rheumatoid arthritis with rheumatoid factor of multiple sites without organ or systems involvement: Secondary | ICD-10-CM

## 2018-09-09 DIAGNOSIS — M3500 Sicca syndrome, unspecified: Secondary | ICD-10-CM

## 2018-09-09 DIAGNOSIS — R768 Other specified abnormal immunological findings in serum: Secondary | ICD-10-CM

## 2018-09-09 DIAGNOSIS — M7061 Trochanteric bursitis, right hip: Secondary | ICD-10-CM

## 2018-09-09 DIAGNOSIS — Z8709 Personal history of other diseases of the respiratory system: Secondary | ICD-10-CM

## 2018-09-09 DIAGNOSIS — M7062 Trochanteric bursitis, left hip: Secondary | ICD-10-CM

## 2018-09-09 DIAGNOSIS — Z720 Tobacco use: Secondary | ICD-10-CM

## 2018-09-09 NOTE — Patient Instructions (Signed)
Standing Labs We placed an order today for your standing lab work.    Please come back and get your standing labs in April and Every 3 months  We have open lab Monday through Friday from 8:30-11:30 AM and 1:30-4:00 PM  at the office of Dr. Pollyann Savoy.   You may experience shorter wait times on Monday and Friday afternoons. The office is located at [address], [address], [place], [place] [address] No appointment is necessary.   Labs are drawn by [facility].  You may receive a bill from [facility] for your lab work.  If you wish to have your labs drawn at another location, please call the office 24 hours in advance to send orders.  If you have any questions regarding directions or hours of operation,  please call [phone].   Just as a reminder please drink plenty of water prior to coming for your lab work. Thanks!

## 2018-09-10 LAB — CBC WITH DIFFERENTIAL/PLATELET
ABSOLUTE MONOCYTES: 659 {cells}/uL (ref 200–950)
BASOS PCT: 1.2 %
Basophils Absolute: 107 cells/uL (ref 0–200)
EOS PCT: 3.3 %
Eosinophils Absolute: 294 cells/uL (ref 15–500)
HEMATOCRIT: 42.4 % (ref 35.0–45.0)
Hemoglobin: 14.7 g/dL (ref 11.7–15.5)
LYMPHS ABS: 1869 {cells}/uL (ref 850–3900)
MCH: 31.3 pg (ref 27.0–33.0)
MCHC: 34.7 g/dL (ref 32.0–36.0)
MCV: 90.4 fL (ref 80.0–100.0)
MPV: 11.8 fL (ref 7.5–12.5)
Monocytes Relative: 7.4 %
NEUTROS PCT: 67.1 %
Neutro Abs: 5972 cells/uL (ref 1500–7800)
PLATELETS: 216 10*3/uL (ref 140–400)
RBC: 4.69 10*6/uL (ref 3.80–5.10)
RDW: 11.8 % (ref 11.0–15.0)
TOTAL LYMPHOCYTE: 21 %
WBC: 8.9 10*3/uL (ref 3.8–10.8)

## 2018-09-10 LAB — COMPLETE METABOLIC PANEL WITH GFR
AG Ratio: 2.1 (calc) (ref 1.0–2.5)
ALKALINE PHOSPHATASE (APISO): 89 U/L (ref 33–130)
ALT: 33 U/L — AB (ref 6–29)
AST: 23 U/L (ref 10–35)
Albumin: 4.1 g/dL (ref 3.6–5.1)
BUN: 9 mg/dL (ref 7–25)
CALCIUM: 9.1 mg/dL (ref 8.6–10.4)
CO2: 31 mmol/L (ref 20–32)
CREATININE: 0.68 mg/dL (ref 0.50–1.05)
Chloride: 103 mmol/L (ref 98–110)
GFR, EST NON AFRICAN AMERICAN: 97 mL/min/{1.73_m2} (ref >=60)
GFR, Est African American: 113 mL/min/{1.73_m2} (ref >=60)
GLUCOSE: 158 mg/dL — AB (ref 65–99)
Globulin: 2 g/dL (calc) (ref 1.9–3.7)
Potassium: 3.1 mmol/L — ABNORMAL LOW (ref 3.5–5.3)
Sodium: 143 mmol/L (ref 135–146)
Total Bilirubin: 0.5 mg/dL (ref 0.2–1.2)
Total Protein: 6.1 g/dL (ref 6.1–8.1)

## 2018-09-10 LAB — LIPID PANEL
CHOL/HDL RATIO: 3.8 (calc) (ref <5.0)
Cholesterol: 179 mg/dL (ref <200)
HDL: 47 mg/dL — ABNORMAL LOW (ref >50)
LDL CHOLESTEROL (CALC): 104 mg/dL — AB
NON-HDL CHOLESTEROL (CALC): 132 mg/dL — AB (ref <130)
Triglycerides: 160 mg/dL — ABNORMAL HIGH (ref <150)

## 2018-09-10 NOTE — Progress Notes (Signed)
Was not a fasting lipid panel.  LDL appears to stable.  Please forward labs to her PCP.

## 2018-10-02 DIAGNOSIS — R739 Hyperglycemia, unspecified: Secondary | ICD-10-CM | POA: Diagnosis not present

## 2018-10-02 DIAGNOSIS — J449 Chronic obstructive pulmonary disease, unspecified: Secondary | ICD-10-CM | POA: Diagnosis not present

## 2018-10-02 DIAGNOSIS — I10 Essential (primary) hypertension: Secondary | ICD-10-CM | POA: Diagnosis not present

## 2018-10-02 DIAGNOSIS — R5383 Other fatigue: Secondary | ICD-10-CM | POA: Diagnosis not present

## 2018-10-02 DIAGNOSIS — E782 Mixed hyperlipidemia: Secondary | ICD-10-CM | POA: Diagnosis not present

## 2018-10-02 DIAGNOSIS — E876 Hypokalemia: Secondary | ICD-10-CM | POA: Diagnosis not present

## 2018-10-07 ENCOUNTER — Telehealth: Payer: Self-pay | Admitting: Rheumatology

## 2018-10-07 DIAGNOSIS — R5383 Other fatigue: Secondary | ICD-10-CM | POA: Diagnosis not present

## 2018-10-07 DIAGNOSIS — E782 Mixed hyperlipidemia: Secondary | ICD-10-CM | POA: Diagnosis not present

## 2018-10-07 DIAGNOSIS — I8310 Varicose veins of unspecified lower extremity with inflammation: Secondary | ICD-10-CM | POA: Diagnosis not present

## 2018-10-07 DIAGNOSIS — I872 Venous insufficiency (chronic) (peripheral): Secondary | ICD-10-CM | POA: Diagnosis not present

## 2018-10-07 MED ORDER — TOCILIZUMAB 162 MG/0.9ML ~~LOC~~ SOSY: PREFILLED_SYRINGE | SUBCUTANEOUS | 0 refills | Status: DC

## 2018-10-07 NOTE — Telephone Encounter (Signed)
Patient needs a refill on Actemra sent to [facility].

## 2018-10-07 NOTE — Telephone Encounter (Signed)
Last Visit: 09/09/18 Next Visit: 02/10/19 Labs: 09/09/18 Elevated glucose potassium 3.1 TB Gold: 10/29/17  Okay to refill per Dr. Corliss Skains

## 2018-11-24 ENCOUNTER — Telehealth: Payer: Self-pay | Admitting: Rheumatology

## 2018-11-25 ENCOUNTER — Other Ambulatory Visit: Payer: Self-pay | Admitting: Rheumatology

## 2018-11-25 NOTE — Telephone Encounter (Signed)
Opened in error

## 2018-11-25 NOTE — Telephone Encounter (Signed)
Last Visit: 09/09/18 Next Visit: 02/10/19 Labs: 09/09/18 elevated glucose, potassium 3.1  Okay to refill per Dr. Corliss Skains

## 2018-12-30 DIAGNOSIS — R739 Hyperglycemia, unspecified: Secondary | ICD-10-CM | POA: Diagnosis not present

## 2018-12-30 DIAGNOSIS — J449 Chronic obstructive pulmonary disease, unspecified: Secondary | ICD-10-CM | POA: Diagnosis not present

## 2018-12-30 DIAGNOSIS — R7309 Other abnormal glucose: Secondary | ICD-10-CM | POA: Insufficient documentation

## 2018-12-30 DIAGNOSIS — E782 Mixed hyperlipidemia: Secondary | ICD-10-CM | POA: Diagnosis not present

## 2018-12-30 DIAGNOSIS — I10 Essential (primary) hypertension: Secondary | ICD-10-CM | POA: Diagnosis not present

## 2019-01-02 DIAGNOSIS — E782 Mixed hyperlipidemia: Secondary | ICD-10-CM | POA: Diagnosis not present

## 2019-01-02 DIAGNOSIS — I8310 Varicose veins of unspecified lower extremity with inflammation: Secondary | ICD-10-CM | POA: Diagnosis not present

## 2019-01-02 DIAGNOSIS — I872 Venous insufficiency (chronic) (peripheral): Secondary | ICD-10-CM | POA: Diagnosis not present

## 2019-01-02 DIAGNOSIS — R5383 Other fatigue: Secondary | ICD-10-CM | POA: Diagnosis not present

## 2019-01-07 ENCOUNTER — Telehealth: Payer: Self-pay | Admitting: Rheumatology

## 2019-01-07 MED ORDER — TOCILIZUMAB 162 MG/0.9ML ~~LOC~~ SOSY: PREFILLED_SYRINGE | SUBCUTANEOUS | 0 refills | Status: DC

## 2019-01-07 NOTE — Telephone Encounter (Addendum)
Requesting an Rx for patients Actemra sent to FAX# [phone]. Patient called also, requesting the refill. Patient is completely out, and due for next dose on Tuesday.

## 2019-01-07 NOTE — Telephone Encounter (Signed)
Last Visit: 09/09/18 Next Visit: 02/10/19 Labs: 09/09/18 elevated glucose, potassium 3.1 Tb Gold: 10/29/17 Neg   Okay to refill per Dr. Corliss Skains  Faxed prescription to [facility].

## 2019-01-09 ENCOUNTER — Telehealth: Payer: Self-pay | Admitting: Rheumatology

## 2019-01-09 NOTE — Telephone Encounter (Signed)
Patient called stating Christina Jenkins called to let her know that the name of the medication was not listed on the prescription that was faxed to them.  Patient requested a return call.

## 2019-01-09 NOTE — Telephone Encounter (Signed)
Spoke with patient and advised we have called in a verbal authorization for Actemra as the pharmacy is having trouble receiving the fax.

## 2019-01-22 DIAGNOSIS — Z1231 Encounter for screening mammogram for malignant neoplasm of breast: Secondary | ICD-10-CM | POA: Diagnosis not present

## 2019-01-23 ENCOUNTER — Telehealth: Payer: Self-pay | Admitting: Pharmacist

## 2019-01-23 NOTE — Telephone Encounter (Signed)
Received a request from [employer] to run a PA and see if patient still needs assistance as she has new insurance.  Received a Prior Authorization request from [facility] for ACTEMRA. Authorization has been submitted to patient's insurance via Cover My Meds. Will update once we receive a response.

## 2019-01-27 NOTE — Telephone Encounter (Signed)
Received a fax from [facility] regarding a prior authorization for ACTEMRA. Authorization has been APPROVED from 01/23/2019 to 09/03/2019.   Will send document to scan center.  Will have call center run test claim to see if she still needs assistance.  Authorization # [id number] Phone # [phone]

## 2019-01-27 NOTE — Telephone Encounter (Signed)
Called [employer] to notify of PA approval but that she is unable to afford the $740 monthly copay.  Representative documented information and patient is still enrolled in PAP.  She will continue to receive free medication through the program at this time.

## 2019-01-27 NOTE — Telephone Encounter (Signed)
Patient has a $740.12 copay for 1 month supply of the Actemra syringe.

## 2019-01-28 NOTE — Progress Notes (Signed)
Office Visit Note  Patient: Christina Jenkins             Date of Birth: [birth year]           MRN: [id number]             PCP: Encarnacion SlatesBoyd, Amy H, PA-C Referring: Encarnacion SlatesBoyd, Amy H, PA-C Visit Date: 02/10/2019 Occupation: @GUAROCC @  Subjective:  Pain in both hands   History of Present Illness: Christina Jenkins is a 58 y.o. female with history of seronegative rheumatoid arthritis, sicca syndrome, and positive ANA. She is on Actemra 162 mg every 14 days and Arava 20 mg po daily.  She denies missing any doses recently.  She reports she is having increased pain in both hands and both feet.  She states she has noticed swelling in both hands recently.  She reports that hot/humid weather exacerbates her joint pain.  She denies any other joint pain or joint swelling.   Activities of Daily Living:  Patient reports morning stiffness for 30-45 minutes.   Patient Denies nocturnal pain.  Difficulty dressing/grooming: Denies Difficulty climbing stairs: Denies Difficulty getting out of chair: Denies Difficulty using hands for taps, buttons, cutlery, and/or writing: Reports  Review of Systems  Constitutional: Negative for fatigue.  HENT: Positive for mouth sores. Negative for mouth dryness and nose dryness.   Eyes: Negative for pain, visual disturbance and dryness.  Respiratory: Negative for cough, hemoptysis, shortness of breath and difficulty breathing.   Cardiovascular: Negative for chest pain, palpitations, hypertension and swelling in legs/feet.  Gastrointestinal: Negative for blood in stool, constipation and diarrhea.  Endocrine: Negative for increased urination.  Genitourinary: Negative for painful urination.  Musculoskeletal: Positive for arthralgias, joint pain, joint swelling and morning stiffness. Negative for myalgias, muscle weakness, muscle tenderness and myalgias.  Skin: Negative for color change, pallor, rash, hair loss, nodules/bumps, skin tightness, ulcers and sensitivity to sunlight.   Allergic/Immunologic: Negative for susceptible to infections.  Neurological: Negative for dizziness, numbness, headaches and weakness.  Hematological: Negative for swollen glands.  Psychiatric/Behavioral: Negative for depressed mood and sleep disturbance. The patient is not nervous/anxious.     PMFS History:  Patient Active Problem List   Diagnosis Date Noted  . History of COPD 05/31/2017  . Fatigue 12/04/2016  . History of seizures 10/23/2016  . History of hepatitis C 10/23/2016  . ANA positive 10/23/2016  . Sicca syndrome, unspecified (HCC) 10/23/2016  . Seropositive rheumatoid arthritis (HCC) 08/01/2016  . High risk medication use 08/01/2016  . Hepatitis C 08/01/2016  . Tobacco abuse 08/01/2016  . Depression 08/01/2016  . Complex partial seizure (HCC) 02/02/2016  . MCI (mild cognitive impairment) 11/02/2015  . Arthralgia 11/02/2015  . Carpal tunnel syndrome 10/20/2015  . Syncope 05/17/2015  . Convulsions/seizures (HCC) 05/17/2015    Past Medical History:  Diagnosis Date  . Arthritis 02/01/16   dx w/rheumatoid arthritis  . Carpal tunnel syndrome 10/20/2015   right  . Carpal tunnel syndrome on both sides   . Dysuria   . Hypokalemia   . Liver disease    hepatitis c  . Seasonal allergies   . Seizures (HCC)    last 05/05/15    Family History  Problem Relation Age of Onset  . Pancreatic cancer Mother   . COPD Father   . Diabetes Brother    Past Surgical History:  Procedure Laterality Date  . TONSILLECTOMY     Social History   Social History Narrative  . Not on file   Immunization History  Administered Date(s) Administered  . Influenza,inj,Quad PF,6+ Mos 05/16/2017, 05/15/2018  . Tdap 06/26/2018  . Zoster Recombinat (Shingrix) 09/04/2017     Objective: Vital Signs: BP 140/78 (BP Location: Left Arm, Patient Position: Sitting, Cuff Size: Normal)   Pulse 76   Resp 14   Ht 5\' 6"  (1.676 m)   Wt 230 lb (104.3 kg)   BMI 37.12 kg/m    Physical Exam Vitals  signs and nursing note reviewed.  Constitutional:      Appearance: She is well-developed.  HENT:     Head: Normocephalic and atraumatic.  Eyes:     Conjunctiva/sclera: Conjunctivae normal.  Neck:     Musculoskeletal: Normal range of motion.  Cardiovascular:     Rate and Rhythm: Normal rate and regular rhythm.     Heart sounds: Normal heart sounds.  Pulmonary:     Effort: Pulmonary effort is normal.     Breath sounds: Normal breath sounds.  Abdominal:     General: Bowel sounds are normal.     Palpations: Abdomen is soft.  Lymphadenopathy:     Cervical: No cervical adenopathy.  Skin:    General: Skin is warm and dry.     Capillary Refill: Capillary refill takes less than 2 seconds.  Neurological:     Mental Status: She is alert and oriented to person, place, and time.  Psychiatric:        Behavior: Behavior normal.      Musculoskeletal Exam: C-spine, thoracic spine, and lumbar spine good ROM.  Shoulder joints, elbow joints, wrist joints, MCPs, PIPs, and DIPs good ROM with no synovitis.  Complete fist formation bilaterally.  PIP and DIP synovial thickening consistent with osteoarthritis of bilateral hands.  Hip joints, knee joints, ankle joints, MTPs, PIPs, and DIPs good ROM with no synovitis.  No warmth or effusion of knee joints.  No tenderness or swelling of ankle joints.  No tenderness over trochanteric bursa bilaterally.    CDAI Exam: CDAI Score: 1  Patient Global Assessment: 5 (mm); Provider Global Assessment: 5 (mm) Swollen: 0 ; Tender: 0  Joint Exam   Not documented   There is currently no information documented on the homunculus. Go to the Rheumatology activity and complete the homunculus joint exam.  Investigation: No additional findings.  Imaging: No results found.  Recent Labs: Lab Results  Component Value Date   WBC 8.9 09/09/2018   HGB 14.7 09/09/2018   PLT 216 09/09/2018   NA 143 09/09/2018   K 3.1 (L) 09/09/2018   CL 103 09/09/2018   CO2 31  09/09/2018   GLUCOSE 158 (H) 09/09/2018   BUN 9 09/09/2018   CREATININE 0.68 09/09/2018   BILITOT 0.5 09/09/2018   ALKPHOS 69 04/29/2017   AST 23 09/09/2018   ALT 33 (H) 09/09/2018   PROT 6.1 09/09/2018   ALBUMIN 4.2 04/29/2017   CALCIUM 9.1 09/09/2018   GFRAA 113 09/09/2018   QFTBGOLDPLUS NEGATIVE 10/29/2017    Speciality Comments: Need Hep C quant every 6 months  Procedures:  No procedures performed Allergies: Patient has no known allergies.     Assessment / Plan:     Visit Diagnoses: Rheumatoid arthritis with rheumatoid factor of multiple sites without organ or systems involvement (HCC) -She has no synovitis on exam.  She has not had any recent rheumatoid arthritis flares.  She is clinically doing well on Actemra 162 mg subcutaneous injections every 14 days and Arava 20 mg 1 tablet by mouth daily.  She has not missed any  doses recently.  She is been having increased pain in bilateral hands and bilateral feet due to an increase in humidity.  She has no tenderness or synovitis noted on exam.  She has no other joint pain or joint swelling at this time.  She will continue on the current treatment regimen.  A refill of arrival was sent to the pharmacy today.  We will check a sed rate with her lab work today.She was advised to notify us if she develops increased joint pain or joint swelling.  She will follow-up in the office in 5 months.    Plan: Sedimentation rate, leflunomide (ARAVA) 20 MG tablet  High risk medication use - She is on Actemra 162 mg every 14 days and Arava 20 mg daily.  She receives her Actemra through their patient assistance program.  Last TB gold negative on 10/29/2017.  Due for TB gold today and will monitor yearly.  Last lipid panel stable on 09/09/2018 and will monitor yearly.  She is on Crestor 10 mg managed by her PCP.  Most recent CBC/CMP within normal limits except for low potassium and slightly elevated ALT on 09/09/2018.  She is due for CBC/CMP today and will monitor  every 3 months.  Standing orders are in place.  She received her flu vaccine in September and previously Shingrix.  We discussed the importance of holding Actemra and Arava if she develops signs or symptoms of an infection and to resume once infection has completely cleared.  Discussed the importance of social distancing and following the standard precautions recommended by the CDC.- Plan: CBC with Differential/Platelet, COMPLETE METABOLIC PANEL WITH GFR, QuantiFERON-TB Gold Plus, Lipid panel  Sicca syndrome, unspecified (HCC): She has no sicca symptoms at this time.   ANA positive: She has no clinical features of autoimmune disease at this time.   Trochanteric bursitis of both hips:   Other medical conditions are listed as follows:   Tobacco abuse  History of seizures  History of COPD  Carpal tunnel syndrome, unspecified laterality  History of hepatitis C  Other fatigue   Orders: Orders Placed This Encounter  Procedures  . CBC with Differential/Platelet  . COMPLETE METABOLIC PANEL WITH GFR  . QuantiFERON-TB Gold Plus  . Sedimentation rate  . Lipid panel   Meds ordered this encounter  Medications  . leflunomide (ARAVA) 20 MG tablet    Sig: TAKE 1 TABLET(20 MG) BY MOUTH DAILY    Dispense:  30 tablet    Refill:  2      Follow-Up Instructions: Return in about 5 months (around 07/13/2019) for Rheumatoid arthritis.   Gearldine Bienenstock, PA-C   I examined and evaluated the patient with Sherron Ales PA.  Patient complains of pain and discomfort in multiple joints.  She believes that she is having a flare.  She had no synovitis on examination today.  She does have some underlying osteoarthritis which I believe is contributing to her discomfort.  She also has mildly elevated liver functions.  We will check labs today.  Have advised her to avoid Tylenol and NSAIDs . The plan of care was discussed as noted above.  Pollyann Savoy, MD  Note - This record has been created using [employer].  Chart creation errors have been sought, but may not always  have been located. Such creation errors do not reflect on  the standard of medical care.

## 2019-02-10 ENCOUNTER — Other Ambulatory Visit: Payer: Self-pay

## 2019-02-10 ENCOUNTER — Ambulatory Visit (INDEPENDENT_AMBULATORY_CARE_PROVIDER_SITE_OTHER): Payer: Medicare Other | Admitting: Rheumatology

## 2019-02-10 ENCOUNTER — Encounter (INDEPENDENT_AMBULATORY_CARE_PROVIDER_SITE_OTHER): Payer: Self-pay

## 2019-02-10 ENCOUNTER — Encounter: Payer: Self-pay | Admitting: Rheumatology

## 2019-02-10 VITALS — BP 140/78 | HR 76 | Resp 14 | Ht 66.0 in | Wt 230.0 lb

## 2019-02-10 DIAGNOSIS — R768 Other specified abnormal immunological findings in serum: Secondary | ICD-10-CM

## 2019-02-10 DIAGNOSIS — M3500 Sicca syndrome, unspecified: Secondary | ICD-10-CM

## 2019-02-10 DIAGNOSIS — M7061 Trochanteric bursitis, right hip: Secondary | ICD-10-CM

## 2019-02-10 DIAGNOSIS — M0579 Rheumatoid arthritis with rheumatoid factor of multiple sites without organ or systems involvement: Secondary | ICD-10-CM

## 2019-02-10 DIAGNOSIS — Z8709 Personal history of other diseases of the respiratory system: Secondary | ICD-10-CM

## 2019-02-10 DIAGNOSIS — G5600 Carpal tunnel syndrome, unspecified upper limb: Secondary | ICD-10-CM

## 2019-02-10 DIAGNOSIS — M7062 Trochanteric bursitis, left hip: Secondary | ICD-10-CM

## 2019-02-10 DIAGNOSIS — Z79899 Other long term (current) drug therapy: Secondary | ICD-10-CM

## 2019-02-10 DIAGNOSIS — R5383 Other fatigue: Secondary | ICD-10-CM

## 2019-02-10 DIAGNOSIS — Z87898 Personal history of other specified conditions: Secondary | ICD-10-CM

## 2019-02-10 DIAGNOSIS — Z720 Tobacco use: Secondary | ICD-10-CM

## 2019-02-10 DIAGNOSIS — Z8619 Personal history of other infectious and parasitic diseases: Secondary | ICD-10-CM

## 2019-02-10 MED ORDER — LEFLUNOMIDE 20 MG PO TABS: ORAL_TABLET | ORAL | 2 refills | Status: DC

## 2019-02-12 LAB — COMPLETE METABOLIC PANEL WITH GFR
AG Ratio: 2.2 (calc) (ref 1.0–2.5)
ALT: 37 U/L — ABNORMAL HIGH (ref 6–29)
AST: 26 U/L (ref 10–35)
Albumin: 4.2 g/dL (ref 3.6–5.1)
Alkaline phosphatase (APISO): 76 U/L (ref 37–153)
BUN: 8 mg/dL (ref 7–25)
CO2: 25 mmol/L (ref 20–32)
Calcium: 8.9 mg/dL (ref 8.6–10.4)
Chloride: 107 mmol/L (ref 98–110)
Creat: 0.75 mg/dL (ref 0.50–1.05)
GFR, Est African American: 102 mL/min/{1.73_m2} (ref >=60)
GFR, Est Non African American: 88 mL/min/{1.73_m2} (ref >=60)
Globulin: 1.9 g/dL (calc) (ref 1.9–3.7)
Glucose, Bld: 92 mg/dL (ref 65–99)
Potassium: 4 mmol/L (ref 3.5–5.3)
Sodium: 143 mmol/L (ref 135–146)
Total Bilirubin: 0.4 mg/dL (ref 0.2–1.2)
Total Protein: 6.1 g/dL (ref 6.1–8.1)

## 2019-02-12 LAB — CBC WITH DIFFERENTIAL/PLATELET
Absolute Monocytes: 1099 cells/uL — ABNORMAL HIGH (ref 200–950)
Basophils Absolute: 79 cells/uL (ref 0–200)
Basophils Relative: 0.8 %
Eosinophils Absolute: 238 cells/uL (ref 15–500)
Eosinophils Relative: 2.4 %
HCT: 42.5 % (ref 35.0–45.0)
Hemoglobin: 14.1 g/dL (ref 11.7–15.5)
Lymphs Abs: 1693 cells/uL (ref 850–3900)
MCH: 30.7 pg (ref 27.0–33.0)
MCHC: 33.2 g/dL (ref 32.0–36.0)
MCV: 92.6 fL (ref 80.0–100.0)
MPV: 12.3 fL (ref 7.5–12.5)
Monocytes Relative: 11.1 %
Neutro Abs: 6791 cells/uL (ref 1500–7800)
Neutrophils Relative %: 68.6 %
Platelets: 162 10*3/uL (ref 140–400)
RBC: 4.59 10*6/uL (ref 3.80–5.10)
RDW: 12.1 % (ref 11.0–15.0)
Total Lymphocyte: 17.1 %
WBC: 9.9 10*3/uL (ref 3.8–10.8)

## 2019-02-12 LAB — LIPID PANEL
Cholesterol: 115 mg/dL (ref <200)
HDL: 45 mg/dL — ABNORMAL LOW (ref >=50)
LDL Cholesterol (Calc): 49 mg/dL (calc)
Non-HDL Cholesterol (Calc): 70 mg/dL (calc) (ref <130)
Total CHOL/HDL Ratio: 2.6 (calc) (ref <5.0)
Triglycerides: 126 mg/dL (ref <150)

## 2019-02-12 LAB — QUANTIFERON-TB GOLD PLUS
Mitogen-NIL: >10 IU/mL
NIL: 0.02 IU/mL
QuantiFERON-TB Gold Plus: NEGATIVE
TB1-NIL: 0 IU/mL
TB2-NIL: 0 IU/mL

## 2019-02-12 LAB — SEDIMENTATION RATE: Sed Rate: 2 mm/h (ref 0–30)

## 2019-02-12 NOTE — Progress Notes (Signed)
TB gold negative.  Sed rate WNL.  ALT is mildly elevated-37.  Please advise patient to avoid NSAIDs, tylenol, and alcohol.  Rest of CMP WNL. CBC WNL. HDL is low-45.  Please forward labs to PCP.

## 2019-03-11 ENCOUNTER — Other Ambulatory Visit: Payer: Self-pay | Admitting: *Deleted

## 2019-03-11 ENCOUNTER — Telehealth: Payer: Self-pay | Admitting: Rheumatology

## 2019-03-11 MED ORDER — ACTEMRA 162 MG/0.9ML ~~LOC~~ SOSY: PREFILLED_SYRINGE | SUBCUTANEOUS | Status: DC

## 2019-03-11 NOTE — Telephone Encounter (Signed)
Okay to give prednisone taper starting at 20 mg ,taper by 5 mg every 2 days

## 2019-03-11 NOTE — Telephone Encounter (Signed)
Last Visit: 02/10/19  Next Visit: 07/14/19  Labs: 02/10/19 ALT is mildly elevated-37. Please advise patient to avoid NSAIDs, tylenol, and alcohol. Rest of CMP WNL. CBC WNL. TB Gold: 02/10/19 Neg   Okay to refill per Dr. Estanislado Pandy

## 2019-03-11 NOTE — Telephone Encounter (Signed)
Patient states she is having pain in hands, feet and shoulders. Patient states she has noticed swelling in her hands and feet. Patient states she had been taking Ibuprofen when she had pain but was advised to stop due to elevated liver functions. Patient is on Actemra 162 mg subcutaneous injections every 14 days and Arava 20 mg 1 tablet by mouth daily. Patient states she has not missed any doses. Patient is requesting a prescription for prednisone. Please advise.

## 2019-03-11 NOTE — Telephone Encounter (Signed)
Patient called stating she has pain in her hands, feet, and shoulders.  Patient states Ibuprofen has helped in the past, but states Dr. Estanislado Pandy told her she needed to discontinue taking it.  Patient is requesting prescription of Prednisone to be sent to [facility] at [place] in [place].

## 2019-03-12 MED ORDER — PREDNISONE 5 MG PO TABS: ORAL_TABLET | ORAL | 0 refills | Status: DC

## 2019-03-12 NOTE — Telephone Encounter (Signed)
Patient advised prescription for prednisone sent to the pharmacy.  

## 2019-03-30 DIAGNOSIS — E782 Mixed hyperlipidemia: Secondary | ICD-10-CM | POA: Diagnosis not present

## 2019-03-30 DIAGNOSIS — I10 Essential (primary) hypertension: Secondary | ICD-10-CM | POA: Diagnosis not present

## 2019-03-30 DIAGNOSIS — Z79899 Other long term (current) drug therapy: Secondary | ICD-10-CM | POA: Diagnosis not present

## 2019-03-30 DIAGNOSIS — E876 Hypokalemia: Secondary | ICD-10-CM | POA: Diagnosis not present

## 2019-04-01 DIAGNOSIS — R5383 Other fatigue: Secondary | ICD-10-CM | POA: Diagnosis not present

## 2019-04-01 DIAGNOSIS — I872 Venous insufficiency (chronic) (peripheral): Secondary | ICD-10-CM | POA: Diagnosis not present

## 2019-04-01 DIAGNOSIS — R0602 Shortness of breath: Secondary | ICD-10-CM | POA: Diagnosis not present

## 2019-04-01 DIAGNOSIS — E782 Mixed hyperlipidemia: Secondary | ICD-10-CM | POA: Diagnosis not present

## 2019-04-01 DIAGNOSIS — R609 Edema, unspecified: Secondary | ICD-10-CM | POA: Diagnosis not present

## 2019-04-01 DIAGNOSIS — I8310 Varicose veins of unspecified lower extremity with inflammation: Secondary | ICD-10-CM | POA: Diagnosis not present

## 2019-04-01 DIAGNOSIS — J449 Chronic obstructive pulmonary disease, unspecified: Secondary | ICD-10-CM | POA: Diagnosis not present

## 2019-04-06 ENCOUNTER — Telehealth: Payer: Self-pay | Admitting: Rheumatology

## 2019-04-06 NOTE — Telephone Encounter (Signed)
Called patient and advised her that we have not received labs. I provided patient with the [facility]. Fax number and she will call Dr.  Merilyn Baba office and have them re-faxed. I advised patient that once they are received, I will have Dr. Estanislado Pandy review them and call with any changes (if needed) for medications. Patient verbalized understanding.

## 2019-04-06 NOTE — Telephone Encounter (Addendum)
Labs received. Hazel Sams, PA-C reviewed them.   03/30/2019  AST 45 ALT 61

## 2019-04-06 NOTE — Telephone Encounter (Signed)
Ok to proceed with Actemra injection tomorrow.  Please advise patient to hold [place].  Please schedule an appointment with patient this week to discuss lab work and medications. She may need a reduced dose of Arava (10 mg) or to switch to another medication that will not affect her LFTs.

## 2019-04-06 NOTE — Telephone Encounter (Signed)
Advised patient ok to proceed with actemra injection tomorrow, but to hold arava. Patient verbalized understanding. Patient scheduled for 04/07/2019 at 8:40am.

## 2019-04-06 NOTE — Telephone Encounter (Signed)
Patient called stating she had labwork with her PCP Dr. Kassie Mends which showed elevated liver function.  Patient states Dr. Luciana Axe faxed over the results of her labwork to Dr. Estanislado Pandy on Friday, 04/03/19.  Patient states Dr. Luciana Axe wanted her to check with Dr. Estanislado Pandy before continuing with her two medications (Actemra and [place]).  Patient states she is due for injection tomorrow 04/07/19 and requesting a return call.

## 2019-04-07 ENCOUNTER — Encounter: Payer: Self-pay | Admitting: Physician Assistant

## 2019-04-07 ENCOUNTER — Ambulatory Visit: Payer: Self-pay

## 2019-04-07 ENCOUNTER — Other Ambulatory Visit: Payer: Self-pay

## 2019-04-07 ENCOUNTER — Ambulatory Visit (INDEPENDENT_AMBULATORY_CARE_PROVIDER_SITE_OTHER): Payer: Medicare Other | Admitting: Physician Assistant

## 2019-04-07 VITALS — BP 142/84 | HR 82 | Resp 15 | Ht 66.0 in | Wt 239.6 lb

## 2019-04-07 DIAGNOSIS — M0579 Rheumatoid arthritis with rheumatoid factor of multiple sites without organ or systems involvement: Secondary | ICD-10-CM

## 2019-04-07 DIAGNOSIS — G8929 Other chronic pain: Secondary | ICD-10-CM

## 2019-04-07 DIAGNOSIS — M79672 Pain in left foot: Secondary | ICD-10-CM

## 2019-04-07 DIAGNOSIS — Z79899 Other long term (current) drug therapy: Secondary | ICD-10-CM

## 2019-04-07 DIAGNOSIS — M79671 Pain in right foot: Secondary | ICD-10-CM

## 2019-04-07 DIAGNOSIS — Z8709 Personal history of other diseases of the respiratory system: Secondary | ICD-10-CM

## 2019-04-07 DIAGNOSIS — M7062 Trochanteric bursitis, left hip: Secondary | ICD-10-CM

## 2019-04-07 DIAGNOSIS — M79641 Pain in right hand: Secondary | ICD-10-CM

## 2019-04-07 DIAGNOSIS — M79642 Pain in left hand: Secondary | ICD-10-CM

## 2019-04-07 DIAGNOSIS — M19042 Primary osteoarthritis, left hand: Secondary | ICD-10-CM | POA: Diagnosis not present

## 2019-04-07 DIAGNOSIS — M19072 Primary osteoarthritis, left ankle and foot: Secondary | ICD-10-CM

## 2019-04-07 DIAGNOSIS — M19041 Primary osteoarthritis, right hand: Secondary | ICD-10-CM | POA: Diagnosis not present

## 2019-04-07 DIAGNOSIS — M19071 Primary osteoarthritis, right ankle and foot: Secondary | ICD-10-CM

## 2019-04-07 DIAGNOSIS — M25512 Pain in left shoulder: Secondary | ICD-10-CM | POA: Diagnosis not present

## 2019-04-07 DIAGNOSIS — M7061 Trochanteric bursitis, right hip: Secondary | ICD-10-CM

## 2019-04-07 DIAGNOSIS — Z87898 Personal history of other specified conditions: Secondary | ICD-10-CM

## 2019-04-07 DIAGNOSIS — R768 Other specified abnormal immunological findings in serum: Secondary | ICD-10-CM

## 2019-04-07 DIAGNOSIS — M3500 Sicca syndrome, unspecified: Secondary | ICD-10-CM

## 2019-04-07 DIAGNOSIS — Z720 Tobacco use: Secondary | ICD-10-CM

## 2019-04-07 DIAGNOSIS — Z8619 Personal history of other infectious and parasitic diseases: Secondary | ICD-10-CM

## 2019-04-07 MED ORDER — LEFLUNOMIDE 10 MG PO TABS: 10.0000 mg | ORAL_TABLET | Freq: Every day | ORAL | 0 refills | Status: DC

## 2019-04-07 NOTE — Progress Notes (Addendum)
Office Visit Note  Patient: Christina Jenkins             Date of Birth: 1961-07-24           MRN: [id number]             PCP: Encarnacion Slates, PA-C Referring: Encarnacion Slates, PA-C Visit Date: 04/07/2019 Occupation: @GUAROCC @  Subjective:  Pain in multiple joints    History of Present Illness: Christina Jenkins is a 58 y.o. female with history of seropositive rheumatoid arthritis.  She is on Actemra 162 mg sq every 2 weeks and Arava 20 mg po daily.  She had lab work drawn on 03/30/2019 which revealed elevated LFTs that have been trending up.  She has a limited Tylenol and NSAIDs as pain relievers.  She does not drink alcohol.  She patient continues to have pain in bilateral hands and bilateral wrist joints.  She states that she has swelling in her right wrist at this time.  She is also been having increased pain in the left shoulder for the past 1 month.  She experiences discomfort in her ankle joints at the end of the day.  She states she is due for her Actemra injection tomorrow and held her Ranae Plumber last night in case we need to make any changes.   Activities of Daily Living:  Patient reports morning stiffness for 30-40 minutes.   Patient Reports nocturnal pain.  Difficulty dressing/grooming: Denies Difficulty climbing stairs: Denies Difficulty getting out of chair: Denies Difficulty using hands for taps, buttons, cutlery, and/or writing: Reports  Review of Systems  Constitutional: Negative for fatigue.  HENT: Negative for mouth sores, mouth dryness and nose dryness.   Eyes: Negative for pain, itching, visual disturbance and dryness.  Respiratory: Negative for cough, hemoptysis, shortness of breath, wheezing and difficulty breathing.   Cardiovascular: Positive for swelling in legs/feet. Negative for chest pain, palpitations and hypertension.  Gastrointestinal: Negative for abdominal pain, blood in stool, constipation and diarrhea.  Endocrine: Negative for increased urination.  Genitourinary:  Negative for painful urination.  Musculoskeletal: Positive for arthralgias, joint pain, joint swelling and morning stiffness. Negative for myalgias, muscle weakness, muscle tenderness and myalgias.  Skin: Negative for color change, pallor, rash, hair loss, nodules/bumps, redness, skin tightness, ulcers and sensitivity to sunlight.  Allergic/Immunologic: Negative for susceptible to infections.  Neurological: Negative for dizziness, light-headedness, numbness, headaches, memory loss and weakness.  Hematological: Negative for swollen glands.  Psychiatric/Behavioral: Negative for depressed mood, confusion and sleep disturbance. The patient is not nervous/anxious.     PMFS History:  Patient Active Problem List   Diagnosis Date Noted   History of COPD 05/31/2017   Fatigue 12/04/2016   History of seizures 10/23/2016   History of hepatitis C 10/23/2016   ANA positive 10/23/2016   Sicca syndrome, unspecified (HCC) 10/23/2016   Seropositive rheumatoid arthritis (HCC) 08/01/2016   High risk medication use 08/01/2016   Hepatitis C 08/01/2016   Tobacco abuse 08/01/2016   Depression 08/01/2016   Complex partial seizure (HCC) 02/02/2016   MCI (mild cognitive impairment) 11/02/2015   Arthralgia 11/02/2015   Carpal tunnel syndrome 10/20/2015   Syncope 05/17/2015   Convulsions/seizures (HCC) 05/17/2015    Past Medical History:  Diagnosis Date   Arthritis 02/01/16   dx w/rheumatoid arthritis   Carpal tunnel syndrome 10/20/2015   right   Carpal tunnel syndrome on both sides    Dysuria    Hypokalemia    Liver disease    hepatitis c  Seasonal allergies    Seizures ([place])    last 05/05/15    Family History  Problem Relation Age of Onset   Pancreatic cancer Mother    COPD Father    Diabetes Brother    Past Surgical History:  Procedure Laterality Date   TONSILLECTOMY     Social History   Social History Narrative   Not on file   Immunization History    Administered Date(s) Administered   Influenza,inj,Quad PF,6+ Mos 05/16/2017, 05/15/2018   Tdap 06/26/2018   Zoster Recombinat (Shingrix) 09/04/2017     Objective: Vital Signs: BP (!) 142/84 (BP Location: Left Arm, Patient Position: Sitting, Cuff Size: Large)    Pulse 82    Resp 15    Ht 5\' 6"  (1.676 m)    Wt 239 lb 9.6 oz (108.7 kg)    BMI 38.67 kg/m    Physical Exam Vitals signs and nursing note reviewed.  Constitutional:      Appearance: She is well-developed.  HENT:     Head: Normocephalic and atraumatic.  Eyes:     Conjunctiva/sclera: Conjunctivae normal.  Neck:     Musculoskeletal: Normal range of motion.  Cardiovascular:     Rate and Rhythm: Normal rate and regular rhythm.     Heart sounds: Normal heart sounds.  Pulmonary:     Effort: Pulmonary effort is normal.     Breath sounds: Normal breath sounds.  Abdominal:     General: Bowel sounds are normal.     Palpations: Abdomen is soft.  Lymphadenopathy:     Cervical: No cervical adenopathy.  Skin:    General: Skin is warm and dry.     Capillary Refill: Capillary refill takes less than 2 seconds.  Neurological:     Mental Status: She is alert and oriented to person, place, and time.  Psychiatric:        Behavior: Behavior normal.      Musculoskeletal Exam: C-spine, thoracic spine, and lumbar spine good ROM.  No midline spinal tenderness.  No SI joint tenderness. Left shoulder discomfort with abduction and internal rotation.  Right shoulder has full ROM with no discomfort.  Elbow joints have good ROM with no tenderness or inflammation.  Tenderness over the right wrist joint.  No tenderness or synovitis of MCP joints.  PIP and DIP synovial thickening consistent with osteoarthritis of both hands.  Hip joints, knee joints, ankle joints, MTPs, PIPs, and DIPs good ROM with no synovitis.  No warmth or effusion of knee joints.  Pedal edema bilaterally.   CDAI Exam: CDAI Score: 0.8  Patient Global: 4 mm; Provider Global: 4  mm Swollen: 0 ; Tender: 0  Joint Exam   No joint exam has been documented for this visit   There is currently no information documented on the homunculus. Go to the Rheumatology activity and complete the homunculus joint exam.  Investigation: No additional findings.  Imaging: Xr Foot 2 Views Left  Result Date: 04/07/2019 First MTP narrowing and varus deformity was noted.  PIP and DIP narrowing was noted.  No erosive changes were noted.  No intertarsal joint space narrowing was noted.  No subtalar joint space narrowing was noted.  A small calcaneal spur was noted. Impression: These findings are consistent with osteoarthritis of the foot.  Xr Foot 2 Views Right  Result Date: 04/07/2019 First MTP narrowing and subluxation was noted.  PIP and DIP narrowing was noted.  No other MTPs were narrow.  No erosive changes were noted.  No  intertarsal joint space narrowing was noted.  No subtalar joint space narrowing was noted. Impression: These findings are consistent with osteoarthritis of the foot.  Xr Hand 2 View Left  Result Date: 04/07/2019 PIP and DIP narrowing was noted.  Juxta-articular osteopenia was noted.  No MCP narrowing or erosive changes were noted.  No intercarpal joint space narrowing was noted.  No radiocarpal joint space narrowing was noted.  A cyst was noted in the scaphoid bone. Impression: These findings are consistent with osteoarthritis and rheumatoid arthritis overlap.  Xr Hand 2 View Right  Result Date: 04/07/2019 PIP and DIP narrowing was noted.  No MCP, intercarpal or radiocarpal joint space narrowing was noted.  No erosive changes were noted.  Calcification of the second proximal phalanx was noted.  Juxta-articular osteopenia was noted. Impression: These findings are consistent with osteoarthritis and rheumatoid arthritis overlap.  Xr Shoulder Left  Result Date: 04/07/2019 No glenohumeral or acromioclavicular joint space narrowing was noted.  No chondrocalcinosis was noted.  Impression: Unremarkable x-ray of the shoulder joint.   Recent Labs: Lab Results  Component Value Date   WBC 9.9 02/10/2019   HGB 14.1 02/10/2019   PLT 162 02/10/2019   NA 143 02/10/2019   K 4.0 02/10/2019   CL 107 02/10/2019   CO2 25 02/10/2019   GLUCOSE 92 02/10/2019   BUN 8 02/10/2019   CREATININE 0.75 02/10/2019   BILITOT 0.4 02/10/2019   ALKPHOS 69 04/29/2017   AST 26 02/10/2019   ALT 37 (H) 02/10/2019   PROT 6.1 02/10/2019   ALBUMIN 4.2 04/29/2017   CALCIUM 8.9 02/10/2019   GFRAA 102 02/10/2019   QFTBGOLDPLUS NEGATIVE 02/10/2019    Speciality Comments: Need Hep C quant every 6 months Prior therapy: methotrexate (oral ulcers) and Cimzia (inadequate response)   Procedures:  No procedures performed Allergies: Patient has no known allergies.      Assessment / Plan:     Visit Diagnoses: Rheumatoid arthritis with rheumatoid factor of multiple sites without organ or systems involvement (HCC) -She has no active synovitis on exam today.  She has tenderness on the dorsal aspect of the right wrist joint.  She has intermittent pain in both hands and both wrist joints.  She has been experiencing left shoulder joint pain for the past 1 month.  She has discomfort with abduction and internal rotation.  She has no warmth or effusion of the shoulder joint on exam.  X-rays of both hands, both feet, and the left shoulder were obtained today.  X-rays of her hands were consistent with rheumatoid arthritis and osteoarthritis overlap.  No erosive changes were noted.  X-rays of the left shoulder were unremarkable.  She is on Actemra 162 mg sq injections every other week and Arava 20 mg po daily.  LFTs continue to trend up despite discontinuing tylenol and NSAID use.  We discussed treatment options at length today.  She was advised to notify us if she develops increased joint pain or joint swelling.  She will follow up in 3 months.  Plan: XR Hand 2 View Left, XR Hand 2 View Right, XR Foot 2 Views  Right, XR Foot 2 Views Left, XR Shoulder Left,  High risk medication use - Actemra 162 mg every 14 days and Arava 20 mg daily. CBC and CMP were drawn on 03/30/2019.  AST was 45 and ALT was 61.  Her LFTs continue to trend up.  Lipid panel was updated on 03/30/2019.  Triglycerides were 212 and VLDL was 42.She is on  Crestor 10 mg managed by her PCP.    CBC WNL. She has a history of hepatitis C.  We will check a Hep C RNA quantitative today and every 6 months.  She receives her Actemra through their patient assistance program.  Prior therapy: methotrexate (oral ulcers) and Cimzia (inadequate response/psoriasis). We applied for Orencia in the past but was denied as Actemra and Harriette OharaXeljanz preferred.  She has new insurance and could apply for Orencia again.  Sicca syndrome, unspecified (HCC) - She has no sicca symptoms.   ANA positive - She has no other clinical features of autoimmune disease at this time.  She was advised to notify us if she develops any new or worsening symptoms.     Trochanteric bursitis of both hips - She has intermittent trochanteric bursitis.   History of hepatitis C -Hepatitis C RNA quantitative will be checked every 6 months.   Chronic left shoulder pain -She presents today with left shoulder joint pain that started 1 month ago.  She has not had any recent injuries.  She has discomfort with abduction and internal rotation.  According to the patient her PCP has ordered a thorough cardiac work-up.  X-rays of the left shoulder were obtained today.  X-rays of the left shoulder were unremarkable today. Plan: XR Shoulder Left  Pain in both hands -She has intermittent pain in bilateral hands and bilateral wrist joints.  She has no tenderness or synovitis of MCP joints.  She has tenderness of the right wrist on exam today.  X-rays of both hands were obtained today to assess for radiographic progression. No erosive changes were noted.  Findings were consistent with osteoarthritis and rheumatoid  arthritis overlap. Plan: XR Hand 2 View Left, XR Hand 2 View Right  Pain in both feet -She has intermittent pain in both feet.  She has pedal edema bilaterally.  No tenderness of ankle joints noted on exam today.  X-rays of both feet were obtained today. Findings were consistent with osteoarthritis.  No erosive changes were noted. Plan: XR Foot 2 Views Right, XR Foot 2 Views Left  Other medical conditions are listed as follows:   Tobacco abuse   History of seizures  History of COPD     Orders: Orders Placed This Encounter  Procedures   XR Hand 2 View Left   XR Hand 2 View Right   XR Foot 2 Views Right   XR Foot 2 Views Left   XR Shoulder Left   No orders of the defined types were placed in this encounter.   Face-to-face time spent with patient was 30 minutes. Greater than 50% of time was spent in counseling and coordination of care.  Follow-Up Instructions: Return in about 3 months (around 07/08/2019) for Rheumatoid arthritis.   Gearldine Bienenstockaylor M Jisell Majer, PA-C   I examined and evaluated the patient with Sherron Alesaylor Peyson Postema PA.  Patient continues to have a lot of joint pain and discomfort.  X-rays obtained today were unremarkable for any disease progression.  I believe most of the discomfort is coming from underlying osteoarthritis.  We will continue current treatment.  The plan of care was discussed as noted above.  Pollyann SavoyShaili Deveshwar, MD  Note - This record has been created using [employer] software.  Chart creation errors have been sought, but may not always  have been located. Such creation errors do not reflect on  the standard of medical care.

## 2019-04-07 NOTE — Patient Instructions (Signed)
Decrease dose of Arava to 10 mg daily  Standing Labs We placed an order today for your standing lab work.    Please come back and get your standing labs the end of October and then every 3 months.  We have open lab daily Monday through Thursday from 8:30-12:30 PM and 1:30-4:30 PM and Friday from 8:30-12:30 PM and 1:30 -4:00 PM at the office of Dr. Bo Merino.   You may experience shorter wait times on Monday and Friday afternoons. The office is located at [address], [place], [place], [place] [address] No appointment is necessary.   Labs are drawn by [facility].  You may receive a bill from [facility] for your lab work.  If you wish to have your labs drawn at another location, please call the office 24 hours in advance to send orders.  If you have any questions regarding directions or hours of operation,  please call [phone].   Just as a reminder please drink plenty of water prior to coming for your lab work. Thanks!

## 2019-04-09 DIAGNOSIS — M059 Rheumatoid arthritis with rheumatoid factor, unspecified: Secondary | ICD-10-CM | POA: Diagnosis not present

## 2019-04-09 DIAGNOSIS — M130 Polyarthritis, unspecified: Secondary | ICD-10-CM | POA: Diagnosis not present

## 2019-04-09 DIAGNOSIS — Z79899 Other long term (current) drug therapy: Secondary | ICD-10-CM | POA: Diagnosis not present

## 2019-04-10 DIAGNOSIS — R079 Chest pain, unspecified: Secondary | ICD-10-CM | POA: Diagnosis not present

## 2019-04-10 DIAGNOSIS — R600 Localized edema: Secondary | ICD-10-CM | POA: Diagnosis not present

## 2019-04-10 DIAGNOSIS — R0602 Shortness of breath: Secondary | ICD-10-CM | POA: Diagnosis not present

## 2019-04-12 LAB — HEPATITIS C RNA QUANTITATIVE
HCV Quantitative Log: <1.18 Log IU/mL
HCV RNA, PCR, QN: <15 IU/mL

## 2019-04-13 NOTE — Progress Notes (Signed)
Hep C quantitative not detected

## 2019-04-17 DIAGNOSIS — R079 Chest pain, unspecified: Secondary | ICD-10-CM | POA: Diagnosis not present

## 2019-04-17 DIAGNOSIS — I4510 Unspecified right bundle-branch block: Secondary | ICD-10-CM | POA: Diagnosis not present

## 2019-04-17 DIAGNOSIS — I510 Cardiac septal defect, acquired: Secondary | ICD-10-CM | POA: Diagnosis not present

## 2019-04-18 DIAGNOSIS — I999 Unspecified disorder of circulatory system: Secondary | ICD-10-CM | POA: Insufficient documentation

## 2019-04-18 DIAGNOSIS — I998 Other disorder of circulatory system: Secondary | ICD-10-CM | POA: Insufficient documentation

## 2019-04-22 ENCOUNTER — Encounter: Payer: Self-pay | Admitting: *Deleted

## 2019-04-23 ENCOUNTER — Encounter: Payer: Self-pay | Admitting: Cardiovascular Disease

## 2019-04-23 ENCOUNTER — Ambulatory Visit: Payer: Medicare Other | Admitting: Cardiovascular Disease

## 2019-04-23 ENCOUNTER — Other Ambulatory Visit: Payer: Self-pay

## 2019-04-23 ENCOUNTER — Encounter: Payer: Self-pay | Admitting: *Deleted

## 2019-04-23 VITALS — BP 150/84 | HR 102 | Ht 66.0 in | Wt 236.0 lb

## 2019-04-23 DIAGNOSIS — Z720 Tobacco use: Secondary | ICD-10-CM | POA: Diagnosis not present

## 2019-04-23 DIAGNOSIS — I83893 Varicose veins of bilateral lower extremities with other complications: Secondary | ICD-10-CM | POA: Diagnosis not present

## 2019-04-23 DIAGNOSIS — E785 Hyperlipidemia, unspecified: Secondary | ICD-10-CM

## 2019-04-23 DIAGNOSIS — R600 Localized edema: Secondary | ICD-10-CM | POA: Diagnosis not present

## 2019-04-23 DIAGNOSIS — R079 Chest pain, unspecified: Secondary | ICD-10-CM

## 2019-04-23 DIAGNOSIS — R030 Elevated blood-pressure reading, without diagnosis of hypertension: Secondary | ICD-10-CM

## 2019-04-23 DIAGNOSIS — J449 Chronic obstructive pulmonary disease, unspecified: Secondary | ICD-10-CM

## 2019-04-23 NOTE — Progress Notes (Addendum)
CARDIOLOGY CONSULT NOTE  Patient ID: Christina Jenkins MRN: [id number] DOB/AGE: Nov 27, 1960 58 y.o.  Admit date: (Not on file) Primary Physician: Encarnacion Slates, PA-C   Reason for Consultation: Chest pain  HPI: Christina Jenkins is a 58 y.o. female who is being seen today for the evaluation of chest pain at the request of Encarnacion Slates, PA-C.   Past medical history includes rheumatoid arthritis, hyperlipidemia, COPD and tobacco abuse.  I reviewed extensive notes and records from her PCP.  Echocardiogram performed on 04/10/2019 demonstrated normal left ventricular systolic function, LVEF 60 to 93%.  There was no significant valvular pathology.  She had been taking Lasix 20 mg daily for bilateral leg swelling, left more than right, but has been on 40 mg this past month.  She has significant venous varicosities in both legs.  She normally wears compression stockings but not today.  She has had some left arm pain.  She underwent a stress test but the results are not available to me at this time.  She has episodic upper left-sided chest pains which are fleeting and last seconds and not associated with exertion.  She said her COPD has been getting worse and she has had worsening shortness of breath.  She also breaks out in sweat spontaneously and with any significant exertion.  She has been smoking a pack of cigarettes daily for 35 years.  No Known Allergies  Current Outpatient Medications  Medication Sig Dispense Refill  . ARIPiprazole (ABILIFY) 10 MG tablet Take 10 mg by mouth daily.    . DULoxetine (CYMBALTA) 30 MG capsule Take 60 mg by mouth daily.   0  . furosemide (LASIX) 20 MG tablet take 1 tablet by mouth every morning if needed for SWELLING  0  . leflunomide (ARAVA) 10 MG tablet Take 1 tablet (10 mg total) by mouth daily. 90 tablet 0  . levETIRAcetam (KEPPRA XR) 500 MG 24 hr tablet Take 1 tablet (500 mg total) by mouth daily. 30 tablet 0  . Multiple Vitamin (MULTIVITAMIN)  tablet Take 1 tablet by mouth daily.    . Potassium Chloride ER 20 MEQ TBCR   0  . rosuvastatin (CRESTOR) 10 MG tablet Take 1 tablet by mouth at bedtime.  2  . Tocilizumab (ACTEMRA) 162 MG/0.9ML SOSY INJECT SUBCUTANEOUSLY 162MG  EVERY OTHER WEEK     No current facility-administered medications for this visit.     Past Medical History:  Diagnosis Date  . Arthritis 02/01/16   dx w/rheumatoid arthritis  . Carpal tunnel syndrome 10/20/2015   right  . Carpal tunnel syndrome on both sides   . Dysuria   . Hypokalemia   . Liver disease    hepatitis c  . Seasonal allergies   . Seizures (HCC)    last 05/05/15    Past Surgical History:  Procedure Laterality Date  . TONSILLECTOMY      Social History   Socioeconomic History  . Marital status: Married    Spouse name: Not on file  . Number of children: Not on file  . Years of education: Not on file  . Highest education level: Not on file  Occupational History  . Not on file  Social Needs  . Financial resource strain: Not on file  . Food insecurity    Worry: Not on file    Inability: Not on file  . Transportation needs    Medical: Not on file    Non-medical: Not on file  Tobacco Use  . Smoking status: Current Every Day Smoker    Packs/day: 1.00    Types: Cigarettes  . Smokeless tobacco: Never Used  Substance and Sexual Activity  . Alcohol use: No  . Drug use: No  . Sexual activity: Not on file  Lifestyle  . Physical activity    Days per week: Not on file    Minutes per session: Not on file  . Stress: Not on file  Relationships  . Social Musicianconnections    Talks on phone: Not on file    Gets together: Not on file    Attends religious service: Not on file    Active member of club or organization: Not on file    Attends meetings of clubs or organizations: Not on file    Relationship status: Not on file  . Intimate partner violence    Fear of current or ex partner: Not on file    Emotionally abused: Not on file     Physically abused: Not on file    Forced sexual activity: Not on file  Other Topics Concern  . Not on file  Social History Narrative  . Not on file     No family history of premature CAD in 1st degree relatives.  No outpatient medications have been marked as taking for the 04/23/19 encounter (Office Visit) with Laqueta LindenKoneswaran, Judie Hollick A, MD.      Review of systems complete and found to be negative unless listed above in HPI    Physical exam Height 5\' 6"  (1.676 m), weight 236 lb (107 kg). General: NAD Neck: No JVD, no thyromegaly or thyroid nodule.  Lungs: Diffusely diminished breath sounds, no crackles. CV: Nondisplaced PMI. Regular rate and rhythm, normal S1/S2, no S3/S4, no murmur.  Trace left leg edema.  Significant bilateral venous varicosities.  No carotid bruit.    Abdomen: Soft, nontender, no distention.  Skin: Intact without lesions or rashes.  Neurologic: Alert and oriented x 3.  Psych: Normal affect. Extremities: No clubbing or cyanosis.  HEENT: Normal.   ECG: Most recent ECG reviewed.   Labs: Lab Results  Component Value Date/Time   K 4.0 02/10/2019 11:15 AM   BUN 8 02/10/2019 11:15 AM   CREATININE 0.75 02/10/2019 11:15 AM   ALT 37 (H) 02/10/2019 11:15 AM   TSH 3.930 11/02/2015 02:35 PM   HGB 14.1 02/10/2019 11:15 AM     Lipids: Lab Results  Component Value Date/Time   LDLCALC 49 02/10/2019 11:15 AM   CHOL 115 02/10/2019 11:15 AM   TRIG 126 02/10/2019 11:15 AM   HDL 45 (L) 02/10/2019 11:15 AM        ASSESSMENT AND PLAN:  1.  Chest pain and left arm pain: She underwent a nuclear stress test and I will asked my staff to try to obtain the results before deciding on the best course of management.  She describes left arm pain and atypical chest pains.  She has had some exertional diaphoresis.  She is currently on statin therapy.  2.  Hyperlipidemia: Lipids reviewed above.  On rosuvastatin.  3.  COPD: She has had some worsening shortness of breath lately.   4.  Tobacco abuse: Need cessation.  Has smoked 1 pack of cigarettes daily for 35 years.  5.  Bilateral leg swelling: She has more left than right leg swelling.  She takes Lasix 40 mg daily.  The cause is significant venous varicosities.  I encouraged her to wear compression stockings and she says she  does.  I told her that diuretics typically do not work that well for this type of swelling.  In any case, I have asked her to try taking Lasix 40 mg twice daily with increase potassium chloride for 3 days to see if this helps.  6.  Elevated blood pressure: She does not carry a diagnosis of hypertension.  This will need further monitoring.    Disposition: Follow up in 3 months  Signed: Kate Sable, M.D., F.A.C.C.  04/23/2019, 1:27 PM

## 2019-04-23 NOTE — H&P (View-Only) (Signed)
CARDIOLOGY CONSULT NOTE  Patient ID: Christina Jenkins MRN: [id number] DOB/AGE: Nov 27, 1960 58 y.o.  Admit date: (Not on file) Primary Physician: Encarnacion Slates, PA-C   Reason for Consultation: Chest pain  HPI: Christina Jenkins is a 58 y.o. female who is being seen today for the evaluation of chest pain at the request of Encarnacion Slates, PA-C.   Past medical history includes rheumatoid arthritis, hyperlipidemia, COPD and tobacco abuse.  I reviewed extensive notes and records from her PCP.  Echocardiogram performed on 04/10/2019 demonstrated normal left ventricular systolic function, LVEF 60 to 93%.  There was no significant valvular pathology.  She had been taking Lasix 20 mg daily for bilateral leg swelling, left more than right, but has been on 40 mg this past month.  She has significant venous varicosities in both legs.  She normally wears compression stockings but not today.  She has had some left arm pain.  She underwent a stress test but the results are not available to me at this time.  She has episodic upper left-sided chest pains which are fleeting and last seconds and not associated with exertion.  She said her COPD has been getting worse and she has had worsening shortness of breath.  She also breaks out in sweat spontaneously and with any significant exertion.  She has been smoking a pack of cigarettes daily for 35 years.  No Known Allergies  Current Outpatient Medications  Medication Sig Dispense Refill  . ARIPiprazole (ABILIFY) 10 MG tablet Take 10 mg by mouth daily.    . DULoxetine (CYMBALTA) 30 MG capsule Take 60 mg by mouth daily.   0  . furosemide (LASIX) 20 MG tablet take 1 tablet by mouth every morning if needed for SWELLING  0  . leflunomide (ARAVA) 10 MG tablet Take 1 tablet (10 mg total) by mouth daily. 90 tablet 0  . levETIRAcetam (KEPPRA XR) 500 MG 24 hr tablet Take 1 tablet (500 mg total) by mouth daily. 30 tablet 0  . Multiple Vitamin (MULTIVITAMIN)  tablet Take 1 tablet by mouth daily.    . Potassium Chloride ER 20 MEQ TBCR   0  . rosuvastatin (CRESTOR) 10 MG tablet Take 1 tablet by mouth at bedtime.  2  . Tocilizumab (ACTEMRA) 162 MG/0.9ML SOSY INJECT SUBCUTANEOUSLY 162MG  EVERY OTHER WEEK     No current facility-administered medications for this visit.     Past Medical History:  Diagnosis Date  . Arthritis 02/01/16   dx w/rheumatoid arthritis  . Carpal tunnel syndrome 10/20/2015   right  . Carpal tunnel syndrome on both sides   . Dysuria   . Hypokalemia   . Liver disease    hepatitis c  . Seasonal allergies   . Seizures (HCC)    last 05/05/15    Past Surgical History:  Procedure Laterality Date  . TONSILLECTOMY      Social History   Socioeconomic History  . Marital status: Married    Spouse name: Not on file  . Number of children: Not on file  . Years of education: Not on file  . Highest education level: Not on file  Occupational History  . Not on file  Social Needs  . Financial resource strain: Not on file  . Food insecurity    Worry: Not on file    Inability: Not on file  . Transportation needs    Medical: Not on file    Non-medical: Not on file  Tobacco Use  . Smoking status: Current Every Day Smoker    Packs/day: 1.00    Types: Cigarettes  . Smokeless tobacco: Never Used  Substance and Sexual Activity  . Alcohol use: No  . Drug use: No  . Sexual activity: Not on file  Lifestyle  . Physical activity    Days per week: Not on file    Minutes per session: Not on file  . Stress: Not on file  Relationships  . Social connections    Talks on phone: Not on file    Gets together: Not on file    Attends religious service: Not on file    Active member of club or organization: Not on file    Attends meetings of clubs or organizations: Not on file    Relationship status: Not on file  . Intimate partner violence    Fear of current or ex partner: Not on file    Emotionally abused: Not on file     Physically abused: Not on file    Forced sexual activity: Not on file  Other Topics Concern  . Not on file  Social History Narrative  . Not on file     No family history of premature CAD in 1st degree relatives.  No outpatient medications have been marked as taking for the 04/23/19 encounter (Office Visit) with Jash Wahlen A, MD.      Review of systems complete and found to be negative unless listed above in HPI    Physical exam Height 5' 6" (1.676 m), weight 236 lb (107 kg). General: NAD Neck: No JVD, no thyromegaly or thyroid nodule.  Lungs: Diffusely diminished breath sounds, no crackles. CV: Nondisplaced PMI. Regular rate and rhythm, normal S1/S2, no S3/S4, no murmur.  Trace left leg edema.  Significant bilateral venous varicosities.  No carotid bruit.    Abdomen: Soft, nontender, no distention.  Skin: Intact without lesions or rashes.  Neurologic: Alert and oriented x 3.  Psych: Normal affect. Extremities: No clubbing or cyanosis.  HEENT: Normal.   ECG: Most recent ECG reviewed.   Labs: Lab Results  Component Value Date/Time   K 4.0 02/10/2019 11:15 AM   BUN 8 02/10/2019 11:15 AM   CREATININE 0.75 02/10/2019 11:15 AM   ALT 37 (H) 02/10/2019 11:15 AM   TSH 3.930 11/02/2015 02:35 PM   HGB 14.1 02/10/2019 11:15 AM     Lipids: Lab Results  Component Value Date/Time   LDLCALC 49 02/10/2019 11:15 AM   CHOL 115 02/10/2019 11:15 AM   TRIG 126 02/10/2019 11:15 AM   HDL 45 (L) 02/10/2019 11:15 AM        ASSESSMENT AND PLAN:  1.  Chest pain and left arm pain: She underwent a nuclear stress test and I will asked my staff to try to obtain the results before deciding on the best course of management.  She describes left arm pain and atypical chest pains.  She has had some exertional diaphoresis.  She is currently on statin therapy.  2.  Hyperlipidemia: Lipids reviewed above.  On rosuvastatin.  3.  COPD: She has had some worsening shortness of breath lately.   4.  Tobacco abuse: Need cessation.  Has smoked 1 pack of cigarettes daily for 35 years.  5.  Bilateral leg swelling: She has more left than right leg swelling.  She takes Lasix 40 mg daily.  The cause is significant venous varicosities.  I encouraged her to wear compression stockings and she says she   does.  I told her that diuretics typically do not work that well for this type of swelling.  In any case, I have asked her to try taking Lasix 40 mg twice daily with increase potassium chloride for 3 days to see if this helps.  6.  Elevated blood pressure: She does not carry a diagnosis of hypertension.  This will need further monitoring.    Disposition: Follow up in 3 months  Signed: Kate Sable, M.D., F.A.C.C.  04/23/2019, 1:27 PM

## 2019-04-23 NOTE — Patient Instructions (Addendum)
Medication Instructions:   Your physician has recommended you make the following change in your medication:   Take furosemide 40 mg by mouth twice daily for 3 days, then resume 40 mg daily  Take 2 of your potassium pills daily for 3 days then resume 20 meq daily  Continue all other medications the same  Labwork:  NONE  Testing/Procedures:  NONE  Follow-Up:  Your physician recommends that you schedule a follow-up appointment in: 3 months.  Any Other Special Instructions Will Be Listed Below (If Applicable).  If you need a refill on your cardiac medications before your next appointment, please call your pharmacy.

## 2019-04-24 ENCOUNTER — Telehealth: Payer: Self-pay | Admitting: Cardiovascular Disease

## 2019-04-24 NOTE — Telephone Encounter (Signed)
Patient is anxious to hear about results from her test

## 2019-04-27 ENCOUNTER — Other Ambulatory Visit: Payer: Self-pay | Admitting: Cardiovascular Disease

## 2019-04-27 DIAGNOSIS — R9439 Abnormal result of other cardiovascular function study: Secondary | ICD-10-CM

## 2019-04-27 MED ORDER — SODIUM CHLORIDE 0.9% FLUSH: 3.0000 mL | Freq: Two times a day (BID) | INTRAVENOUS | Status: DC

## 2019-04-27 NOTE — Telephone Encounter (Signed)
Stress test scanned into chart.

## 2019-04-27 NOTE — Telephone Encounter (Signed)
Stress test is abnormal and suggestive of a possible blockage.  I would start aspirin 81 mg daily and increase rosuvastatin to 20 mg daily.  Please arrange for left heart catheterization and coronary angiography.

## 2019-04-27 NOTE — Telephone Encounter (Signed)
Pt agreeable to cath - scheduled COVID test tomorrow at 340pm pt aware to quarantine until [facility] on 8/27th with Dr Gwenlyn Found - pt voiced understanding of instructions for cath - update medication list

## 2019-04-27 NOTE — Telephone Encounter (Signed)
Patient calling again.  She stated she would really like to her what Dr Bronson Ing said about her test results.

## 2019-04-28 ENCOUNTER — Inpatient Hospital Stay (HOSPITAL_COMMUNITY): Admission: RE | Admit: 2019-04-28 | Payer: Medicare Other | Source: Ambulatory Visit

## 2019-04-28 ENCOUNTER — Other Ambulatory Visit (HOSPITAL_COMMUNITY)
Admission: RE | Admit: 2019-04-28 | Discharge: 2019-04-28 | Disposition: A | Discharge status: Home / Self Care | Payer: Medicare Other | Source: Ambulatory Visit | Attending: Cardiovascular Disease | Admitting: Cardiovascular Disease

## 2019-04-28 ENCOUNTER — Other Ambulatory Visit: Payer: Self-pay

## 2019-04-28 DIAGNOSIS — R9439 Abnormal result of other cardiovascular function study: Secondary | ICD-10-CM | POA: Diagnosis not present

## 2019-04-28 DIAGNOSIS — Z01818 Encounter for other preprocedural examination: Secondary | ICD-10-CM

## 2019-04-28 DIAGNOSIS — Z20828 Contact with and (suspected) exposure to other viral communicable diseases: Secondary | ICD-10-CM | POA: Insufficient documentation

## 2019-04-28 DIAGNOSIS — Z01812 Encounter for preprocedural laboratory examination: Secondary | ICD-10-CM | POA: Insufficient documentation

## 2019-04-28 LAB — SARS CORONAVIRUS 2 (TAT 6-24 HRS): SARS Coronavirus 2: NEGATIVE

## 2019-04-29 ENCOUNTER — Telehealth: Payer: Self-pay | Admitting: *Deleted

## 2019-04-29 NOTE — Telephone Encounter (Addendum)
Pt contacted pre-catheterization scheduled at [facility] for: Thursday April 30, 2019 11:30 AM Verified arrival time and place: [facility]) at: 9:30 AM   No solid food after midnight prior to cath, clear liquids until 5 AM day of procedure. Contrast allergy: no  Hold: Lasix/KCl -AM of procedure.  Except hold medications AM meds can be  taken pre-cath with sip of water including: ASA 81 mg   Confirm patient has responsible person to drive home post procedure and observe 24 hours after arriving home.-yes  Currently, due to Covid-19 pandemic, only one support person will be allowed with patient. Must be the same support person for that patient's entire stay, will be screened and required to wear a mask. They will be asked to wait in the waiting room for the duration of the patient's stay.  Patients are required to wear a mask when they enter the hospital.      COVID-19 Pre-Screening Questions:  . In the past 7 to 10 days have you had a cough,  shortness of breath, headache, congestion, fever (100 or greater) body aches, chills, sore throat, or sudden loss of taste or sense of smell? no . Have you been around anyone with known Covid 19? no . Have you been around anyone who is awaiting Covid 19 test results in the past 7 to 10 days? no . Have you been around anyone who has been exposed to Covid 19, or has mentioned symptoms of Covid 19 within the past 7 to 10 days? no     I reviewed procedure/mask/visitor instructions, Covid-19 screening questions with patient, she verbalized understanding.

## 2019-04-30 ENCOUNTER — Other Ambulatory Visit: Payer: Self-pay

## 2019-04-30 ENCOUNTER — Encounter (HOSPITAL_COMMUNITY)
Admission: RE | Disposition: A | Discharge status: Home / Self Care | Payer: Medicare Other | Source: Home / Self Care | Attending: Cardiovascular Disease

## 2019-04-30 ENCOUNTER — Ambulatory Visit (HOSPITAL_COMMUNITY)
Admission: RE | Admit: 2019-04-30 | Discharge: 2019-04-30 | Disposition: A | Discharge status: Home / Self Care | Payer: Medicare Other | Attending: Cardiovascular Disease | Admitting: Cardiovascular Disease

## 2019-04-30 DIAGNOSIS — B1920 Unspecified viral hepatitis C without hepatic coma: Secondary | ICD-10-CM | POA: Insufficient documentation

## 2019-04-30 DIAGNOSIS — Z79899 Other long term (current) drug therapy: Secondary | ICD-10-CM | POA: Diagnosis not present

## 2019-04-30 DIAGNOSIS — R0789 Other chest pain: Secondary | ICD-10-CM | POA: Diagnosis not present

## 2019-04-30 DIAGNOSIS — G5603 Carpal tunnel syndrome, bilateral upper limbs: Secondary | ICD-10-CM | POA: Diagnosis not present

## 2019-04-30 DIAGNOSIS — R9439 Abnormal result of other cardiovascular function study: Secondary | ICD-10-CM | POA: Diagnosis not present

## 2019-04-30 DIAGNOSIS — F17210 Nicotine dependence, cigarettes, uncomplicated: Secondary | ICD-10-CM | POA: Insufficient documentation

## 2019-04-30 DIAGNOSIS — R079 Chest pain, unspecified: Secondary | ICD-10-CM | POA: Diagnosis not present

## 2019-04-30 DIAGNOSIS — R030 Elevated blood-pressure reading, without diagnosis of hypertension: Secondary | ICD-10-CM | POA: Diagnosis not present

## 2019-04-30 DIAGNOSIS — M069 Rheumatoid arthritis, unspecified: Secondary | ICD-10-CM | POA: Diagnosis not present

## 2019-04-30 DIAGNOSIS — J449 Chronic obstructive pulmonary disease, unspecified: Secondary | ICD-10-CM | POA: Diagnosis not present

## 2019-04-30 DIAGNOSIS — E785 Hyperlipidemia, unspecified: Secondary | ICD-10-CM | POA: Diagnosis not present

## 2019-04-30 DIAGNOSIS — M7989 Other specified soft tissue disorders: Secondary | ICD-10-CM | POA: Diagnosis not present

## 2019-04-30 HISTORY — PX: LEFT HEART CATH AND CORONARY ANGIOGRAPHY: CATH118249

## 2019-04-30 LAB — CBC
HCT: 43.4 % (ref 36.0–46.0)
Hemoglobin: 14.8 g/dL (ref 12.0–15.0)
MCH: 30.9 pg (ref 26.0–34.0)
MCHC: 34.1 g/dL (ref 30.0–36.0)
MCV: 90.6 fL (ref 80.0–100.0)
Platelets: 172 10*3/uL (ref 150–400)
RBC: 4.79 MIL/uL (ref 3.87–5.11)
RDW: 11.6 % (ref 11.5–15.5)
WBC: 6.7 10*3/uL (ref 4.0–10.5)
nRBC: 0 % (ref 0.0–0.2)

## 2019-04-30 LAB — BASIC METABOLIC PANEL
Anion gap: 12 (ref 5–15)
BUN: 11 mg/dL (ref 6–20)
CO2: 26 mmol/L (ref 22–32)
Calcium: 9.5 mg/dL (ref 8.9–10.3)
Chloride: 105 mmol/L (ref 98–111)
Creatinine, Ser: 0.81 mg/dL (ref 0.44–1.00)
GFR calc Af Amer: >60 mL/min (ref >60)
GFR calc non Af Amer: >60 mL/min (ref >60)
Glucose, Bld: 125 mg/dL — ABNORMAL HIGH (ref 70–99)
Potassium: 3.8 mmol/L (ref 3.5–5.1)
Sodium: 143 mmol/L (ref 135–145)

## 2019-04-30 SURGERY — LEFT HEART CATH AND CORONARY ANGIOGRAPHY
Anesthesia: LOCAL

## 2019-04-30 MED ORDER — SODIUM CHLORIDE 0.9 % IV SOLN: INTRAVENOUS | Status: AC

## 2019-04-30 MED ORDER — SODIUM CHLORIDE 0.9% FLUSH: 3.0000 mL | Freq: Two times a day (BID) | INTRAVENOUS | Status: DC

## 2019-04-30 MED ORDER — MIDAZOLAM HCL 2 MG/2ML IJ SOLN
INTRAMUSCULAR | Status: AC
  Filled 2019-04-30: qty 2

## 2019-04-30 MED ORDER — HYDRALAZINE HCL 20 MG/ML IJ SOLN: 10.0000 mg | INTRAMUSCULAR | Status: DC | PRN

## 2019-04-30 MED ORDER — FENTANYL CITRATE (PF) 100 MCG/2ML IJ SOLN
INTRAMUSCULAR | Status: AC
  Filled 2019-04-30: qty 2

## 2019-04-30 MED ORDER — IOHEXOL 350 MG/ML SOLN
INTRAVENOUS | Status: DC | PRN
  Administered 2019-04-30: 14:00:00 40 mL via INTRAVENOUS

## 2019-04-30 MED ORDER — MIDAZOLAM HCL 2 MG/2ML IJ SOLN
INTRAMUSCULAR | Status: DC | PRN
  Administered 2019-04-30: 1 mg via INTRAVENOUS

## 2019-04-30 MED ORDER — SODIUM CHLORIDE 0.9 % WEIGHT BASED INFUSION: 1.0000 mL/kg/h | INTRAVENOUS | Status: DC

## 2019-04-30 MED ORDER — ACETAMINOPHEN 325 MG PO TABS: 650.0000 mg | ORAL_TABLET | ORAL | Status: DC | PRN

## 2019-04-30 MED ORDER — VERAPAMIL HCL 2.5 MG/ML IV SOLN
INTRA_ARTERIAL | Status: DC | PRN
  Administered 2019-04-30: 7 mL via INTRA_ARTERIAL

## 2019-04-30 MED ORDER — HEPARIN (PORCINE) IN NACL 1000-0.9 UT/500ML-% IV SOLN
INTRAVENOUS | Status: DC | PRN
  Administered 2019-04-30 (×2): 500 mL

## 2019-04-30 MED ORDER — SODIUM CHLORIDE 0.9% FLUSH: 3.0000 mL | INTRAVENOUS | Status: DC | PRN

## 2019-04-30 MED ORDER — NITROGLYCERIN 1 MG/10 ML FOR IR/CATH LAB
INTRA_ARTERIAL | Status: AC
  Filled 2019-04-30: qty 10

## 2019-04-30 MED ORDER — ASPIRIN 81 MG PO CHEW: 81.0000 mg | CHEWABLE_TABLET | Freq: Every day | ORAL | Status: DC

## 2019-04-30 MED ORDER — VERAPAMIL HCL 2.5 MG/ML IV SOLN
INTRAVENOUS | Status: AC
  Filled 2019-04-30: qty 2

## 2019-04-30 MED ORDER — SODIUM CHLORIDE 0.9 % IV SOLN: 250.0000 mL | INTRAVENOUS | Status: DC | PRN

## 2019-04-30 MED ORDER — SODIUM CHLORIDE 0.9 % WEIGHT BASED INFUSION
3.0000 mL/kg/h | INTRAVENOUS | Status: AC
  Administered 2019-04-30: 3 mL/kg/h via INTRAVENOUS

## 2019-04-30 MED ORDER — ONDANSETRON HCL 4 MG/2ML IJ SOLN: 4.0000 mg | Freq: Four times a day (QID) | INTRAMUSCULAR | Status: DC | PRN

## 2019-04-30 MED ORDER — HEPARIN (PORCINE) IN NACL 1000-0.9 UT/500ML-% IV SOLN
INTRAVENOUS | Status: AC
  Filled 2019-04-30: qty 1000

## 2019-04-30 MED ORDER — LIDOCAINE HCL (PF) 1 % IJ SOLN
INTRAMUSCULAR | Status: DC | PRN
  Administered 2019-04-30: 2 mL

## 2019-04-30 MED ORDER — LABETALOL HCL 5 MG/ML IV SOLN: 10.0000 mg | INTRAVENOUS | Status: DC | PRN

## 2019-04-30 MED ORDER — MORPHINE SULFATE (PF) 10 MG/ML IV SOLN: 2.0000 mg | INTRAVENOUS | Status: DC | PRN

## 2019-04-30 MED ORDER — ASPIRIN 81 MG PO CHEW: 81.0000 mg | CHEWABLE_TABLET | ORAL | Status: DC

## 2019-04-30 MED ORDER — HEPARIN SODIUM (PORCINE) 1000 UNIT/ML IJ SOLN
INTRAMUSCULAR | Status: DC | PRN
  Administered 2019-04-30: 5000 [IU] via INTRAVENOUS

## 2019-04-30 MED ORDER — FENTANYL CITRATE (PF) 100 MCG/2ML IJ SOLN
INTRAMUSCULAR | Status: DC | PRN
  Administered 2019-04-30: 25 ug via INTRAVENOUS

## 2019-04-30 MED ORDER — LIDOCAINE HCL (PF) 1 % IJ SOLN
INTRAMUSCULAR | Status: AC
  Filled 2019-04-30: qty 30

## 2019-04-30 SURGICAL SUPPLY — 11 items

## 2019-04-30 NOTE — Interval H&P Note (Signed)
Cath Lab Visit (complete for each Cath Lab visit)  Clinical Evaluation Leading to the Procedure:   ACS: No.  Non-ACS:    Anginal Classification: CCS II  Anti-ischemic medical therapy: No Therapy  Non-Invasive Test Results: Intermediate-risk stress test findings: cardiac mortality 1-3%/year  Prior CABG: No previous CABG      History and Physical Interval Note:  04/30/2019 1:46 PM  Christina Jenkins  has presented today for surgery, with the diagnosis of abnormal stress test.  The various methods of treatment have been discussed with the patient and family. After consideration of risks, benefits and other options for treatment, the patient has consented to  Procedure(s): LEFT HEART CATH AND CORONARY ANGIOGRAPHY (N/A) as a surgical intervention.  The patient's history has been reviewed, patient examined, no change in status, stable for surgery.  I have reviewed the patient's chart and labs.  Questions were answered to the patient's satisfaction.     Christina Jenkins

## 2019-04-30 NOTE — Progress Notes (Signed)
Air removed from TR Band and patient began to bleed.  RN reinsterted 8 cc to achieve hemostasis.

## 2019-04-30 NOTE — Discharge Instructions (Signed)
Radial Site Care ° °This sheet gives you information about how to care for yourself after your procedure. Your health care provider may also give you more specific instructions. If you have problems or questions, contact your health care provider. °What can I expect after the procedure? °After the procedure, it is common to have: °· Bruising and tenderness at the catheter insertion area. °Follow these instructions at home: °Medicines °· Take over-the-counter and prescription medicines only as told by your health care provider. °Insertion site care °· Follow instructions from your health care provider about how to take care of your insertion site. Make sure you: °? Wash your hands with soap and water before you change your bandage (dressing). If soap and water are not available, use hand sanitizer. °? Change your dressing as told by your health care provider. °? Leave stitches (sutures), skin glue, or adhesive strips in place. These skin closures may need to stay in place for 2 weeks or longer. If adhesive strip edges start to loosen and curl up, you may trim the loose edges. Do not remove adhesive strips completely unless your health care provider tells you to do that. °· Check your insertion site every day for signs of infection. Check for: °? Redness, swelling, or pain. °? Fluid or blood. °? Pus or a bad smell. °? Warmth. °· Do not take baths, swim, or use a hot tub until your health care provider approves. °· You may shower 24-48 hours after the procedure, or as directed by your health care provider. °? Remove the dressing and gently wash the site with plain soap and water. °? Pat the area dry with a clean towel. °? Do not rub the site. That could cause bleeding. °· Do not apply powder or lotion to the site. °Activity ° °· For 24 hours after the procedure, or as directed by your health care provider: °? Do not flex or bend the affected arm. °? Do not push or pull heavy objects with the affected arm. °? Do not  drive yourself home from the hospital or clinic. You may drive 24 hours after the procedure unless your health care provider tells you not to. °? Do not operate machinery or power tools. °· Do not lift anything that is heavier than 10 lb (4.5 kg), or the limit that you are told, until your health care provider says that it is safe. °· Ask your health care provider when it is okay to: °? Return to work or school. °? Resume usual physical activities or sports. °? Resume sexual activity. °General instructions °· If the catheter site starts to bleed, raise your arm and put firm pressure on the site. If the bleeding does not stop, get help right away. This is a medical emergency. °· If you went home on the same day as your procedure, a responsible adult should be with you for the first 24 hours after you arrive home. °· Keep all follow-up visits as told by your health care provider. This is important. °Contact a health care provider if: °· You have a fever. °· You have redness, swelling, or yellow drainage around your insertion site. °Get help right away if: °· You have unusual pain at the radial site. °· The catheter insertion area swells very fast. °· The insertion area is bleeding, and the bleeding does not stop when you hold steady pressure on the area. °· Your arm or hand becomes pale, cool, tingly, or numb. °These symptoms may represent a serious problem   that is an emergency. Do not wait to see if the symptoms will go away. Get medical help right away. Call your local emergency services (911 in the [place] Do not drive yourself to the hospital. °Summary °· After the procedure, it is common to have bruising and tenderness at the site. °· Follow instructions from your health care provider about how to take care of your radial site wound. Check the wound every day for signs of infection. °· Do not lift anything that is heavier than 10 lb (4.5 kg), or the limit that you are told, until your health care provider says  that it is safe. °This information is not intended to replace advice given to you by your health care provider. Make sure you discuss any questions you have with your health care provider. °Document Released: 09/22/2010 Document Revised: 09/25/2017 Document Reviewed: 09/25/2017 °Elsevier Patient Education © 2020 [employer] ° °

## 2019-05-01 ENCOUNTER — Telehealth: Payer: Self-pay | Admitting: *Deleted

## 2019-05-01 ENCOUNTER — Encounter (HOSPITAL_COMMUNITY): Payer: Self-pay | Admitting: Cardiovascular Disease

## 2019-05-01 NOTE — Telephone Encounter (Signed)
Patient informed and verbalized understanding of plan. 

## 2019-05-01 NOTE — Telephone Encounter (Signed)
Patient says she had a heart cath on yesterday and was told that her results were good. Patient is asking if she needed to continue the aspirin 81 mg and crestor 20 mg daily. Advised that she should continue both. Verbalized understanding.

## 2019-05-01 NOTE — Telephone Encounter (Signed)
She can stop ASA. She was taking Crestor before I evaluated her (prescribed by PCP). Coronary arteries are normal. She can fu with me prn.

## 2019-05-19 ENCOUNTER — Telehealth: Payer: Self-pay | Admitting: Rheumatology

## 2019-05-19 MED ORDER — PREDNISONE 5 MG PO TABS: ORAL_TABLET | ORAL | 0 refills | Status: DC

## 2019-05-19 NOTE — Telephone Encounter (Signed)
Ok to send in prednisone 20 mg tapering by 5 mg every 4 days.

## 2019-05-19 NOTE — Telephone Encounter (Signed)
Advised patient that prescription has been sent to [facility] in [place]. Patient verbalized understanding and she will call with any new symptoms.

## 2019-05-19 NOTE — Telephone Encounter (Signed)
Patient states she is having bilateral hand, bilateral feet and shoulder pain, swelling and stiffness. Patient states the onset was about 1 week ago. Patient has been taking arava 10mg  po qd and actemra subcutaneous as prescribed. Patient was previously on arava 20mg  but the dose was lowered due to elevated LFTs. Patient is requesting a prescription for prednisone. Please advise.

## 2019-05-19 NOTE — Telephone Encounter (Signed)
Patient is requesting a rx for Prednisone. Since Arava dose was cut back patient is having pain, and swelling in hands, feet, and shoulder. Patient uses [facility] in [place]. Please call to advise.

## 2019-06-23 ENCOUNTER — Other Ambulatory Visit: Payer: Self-pay | Admitting: Rheumatology

## 2019-06-23 NOTE — Telephone Encounter (Signed)
Patient left a voicemail requesting [facility]

## 2019-06-23 NOTE — Telephone Encounter (Signed)
Last Visit: 04/07/19 Next Visit: 07/27/19 Labs: 04/30/19 cbc and bmp- glucose 125 TB Gold: 02/10/19 neg   Okay to refill Actemra?

## 2019-06-23 NOTE — Telephone Encounter (Signed)
ok 

## 2019-06-24 MED ORDER — ACTEMRA 162 MG/0.9ML ~~LOC~~ SOSY: 162.0000 mg | PREFILLED_SYRINGE | SUBCUTANEOUS | 0 refills | Status: DC

## 2019-06-26 ENCOUNTER — Encounter: Payer: Self-pay | Admitting: Rheumatology

## 2019-06-26 DIAGNOSIS — R739 Hyperglycemia, unspecified: Secondary | ICD-10-CM | POA: Diagnosis not present

## 2019-06-26 DIAGNOSIS — E782 Mixed hyperlipidemia: Secondary | ICD-10-CM | POA: Diagnosis not present

## 2019-06-26 DIAGNOSIS — I10 Essential (primary) hypertension: Secondary | ICD-10-CM | POA: Diagnosis not present

## 2019-06-26 DIAGNOSIS — Z79899 Other long term (current) drug therapy: Secondary | ICD-10-CM | POA: Diagnosis not present

## 2019-06-30 ENCOUNTER — Other Ambulatory Visit: Payer: Self-pay | Admitting: Rheumatology

## 2019-06-30 NOTE — Progress Notes (Signed)
Office Visit Note  Patient: Christina Jenkins             Date of Birth: 07-03-1961           MRN: [id number]             PCP: Jalene Mullet, PA-C Referring: Jalene Mullet, PA-C Visit Date: 07/13/2019 Occupation: @GUAROCC @  Subjective:  Left shoulder joint pain   History of Present Illness: Christina Jenkins is a 58 y.o. female with history of seropositive rheumatoid arthritis and osteoarthritis.  She is on Actemra 162 mg sq injections every 14 days and Arava 10 mg po daily.  She is due for her next Actemra injection tomorrow.  She is currently on prednisone 10 mg by mouth daily and tapering by 5 mg every 4 days.  She continues to have recurrent flares.  She has been unable to take over-the-counter products for pain relief due to elevated LFTs.  She presents today with increased pain in the left shoulder joint, bilateral hands, bilateral wrists, and bilateral feet.  She notices intermittent swelling in both hands.    Activities of Daily Living:  Patient reports morning stiffness for 1 hour.   Patient Reports nocturnal pain.  Difficulty dressing/grooming: Denies Difficulty climbing stairs: Denies Difficulty getting out of chair: Denies Difficulty using hands for taps, buttons, cutlery, and/or writing: Denies  Review of Systems  Constitutional: Negative for fatigue.  HENT: Negative for mouth sores, mouth dryness and nose dryness.   Eyes: Negative for pain, itching, visual disturbance and dryness.  Respiratory: Negative for cough, hemoptysis, shortness of breath and difficulty breathing.   Cardiovascular: Negative for chest pain, palpitations, hypertension and swelling in legs/feet.  Gastrointestinal: Negative for abdominal pain, blood in stool, constipation and diarrhea.  Endocrine: Negative for increased urination.  Genitourinary: Negative for difficulty urinating and painful urination.  Musculoskeletal: Positive for arthralgias, joint pain, joint swelling and morning stiffness. Negative  for myalgias, muscle weakness, muscle tenderness and myalgias.  Skin: Negative for color change, pallor, rash, hair loss, nodules/bumps, skin tightness, ulcers and sensitivity to sunlight.  Allergic/Immunologic: Negative for susceptible to infections.  Neurological: Negative for dizziness, light-headedness, numbness, headaches, memory loss and weakness.  Hematological: Negative for bruising/bleeding tendency and swollen glands.  Psychiatric/Behavioral: Negative for depressed mood, confusion and sleep disturbance. The patient is not nervous/anxious.     PMFS History:  Patient Active Problem List   Diagnosis Date Noted   Chest pain of uncertain etiology 28/41/3244   Abnormal nuclear stress test    History of COPD 05/31/2017   Fatigue 12/04/2016   History of seizures 10/23/2016   History of hepatitis C 10/23/2016   ANA positive 10/23/2016   Sicca syndrome, unspecified ([place]) 10/23/2016   Seropositive rheumatoid arthritis ([place]) 08/01/2016   High risk medication use 08/01/2016   Hepatitis C 08/01/2016   Tobacco abuse 08/01/2016   Depression 08/01/2016   Complex partial seizure ([place]) 02/02/2016   MCI (mild cognitive impairment) 11/02/2015   Arthralgia 11/02/2015   Carpal tunnel syndrome 10/20/2015   Syncope 05/17/2015   Convulsions/seizures ([place]) 05/17/2015    Past Medical History:  Diagnosis Date   Arthritis 02/01/16   dx w/rheumatoid arthritis   Carpal tunnel syndrome 10/20/2015   right   Carpal tunnel syndrome on both sides    Dysuria    Hypokalemia    Liver disease    hepatitis c   Seasonal allergies    Seizures ([place])    last 05/05/15    Family History  Problem Relation Age of Onset   Pancreatic cancer Mother    COPD Father    Diabetes Brother    Past Surgical History:  Procedure Laterality Date   LEFT HEART CATH AND CORONARY ANGIOGRAPHY N/A 04/30/2019   Procedure: LEFT HEART CATH AND CORONARY ANGIOGRAPHY;  Surgeon: Runell Gess, MD;   Location: [facility];  Service: Cardiovascular;  Laterality: N/A;   TONSILLECTOMY     Social History   Social History Narrative   Not on file   Immunization History  Administered Date(s) Administered   Influenza,inj,Quad PF,6+ Mos 05/16/2017, 05/15/2018   Tdap 06/26/2018   Zoster Recombinat (Shingrix) 09/04/2017     Objective: Vital Signs: BP 139/81 (BP Location: Left Wrist, Patient Position: Sitting, Cuff Size: Normal)    Pulse 89    Resp 15    Ht 5\' 6"  (1.676 m)    Wt 239 lb 3.2 oz (108.5 kg)    BMI 38.61 kg/m    Physical Exam Vitals signs and nursing note reviewed.  Constitutional:      Appearance: She is well-developed.  HENT:     Head: Normocephalic and atraumatic.  Eyes:     Conjunctiva/sclera: Conjunctivae normal.  Neck:     Musculoskeletal: Normal range of motion.  Cardiovascular:     Rate and Rhythm: Normal rate and regular rhythm.     Heart sounds: Normal heart sounds.  Pulmonary:     Effort: Pulmonary effort is normal.     Breath sounds: Normal breath sounds.  Abdominal:     General: Bowel sounds are normal.     Palpations: Abdomen is soft.  Lymphadenopathy:     Cervical: No cervical adenopathy.  Skin:    General: Skin is warm and dry.     Capillary Refill: Capillary refill takes less than 2 seconds.  Neurological:     Mental Status: She is alert and oriented to person, place, and time.  Psychiatric:        Behavior: Behavior normal.      Musculoskeletal Exam: C-spine, thoracic spine, lumbar spine good range of motion.  No midline spinal tenderness.  No SI joint tenderness.  Right shoulder is full range of motion with no discomfort.  Left shoulder has limited internal rotation and abduction with discomfort.  Elbow joints, wrist joints, MCPs, PIPs, DIPs good range of motion no synovitis.  She has tenderness of bilateral wrist joints.  She has tenderness of the second through fifth MCP joints bilaterally.  Hip joints, knee joints, ankle joints,  MTPs, PIPs, DIPs good range of motion no synovitis.  No warmth or effusion of bilateral knee joints.  No tenderness or swelling of ankle joints.  She has tenderness of MTP joints.  CDAI Exam: CDAI Score: 12  Patient Global: 5 mm; Provider Global: 5 mm Swollen: 0 ; Tender: 21  Joint Exam      Right  Left  Glenohumeral      Tender  Wrist   Tender   Tender  MCP 2   Tender   Tender  MCP 3   Tender   Tender  MCP 4   Tender   Tender  MCP 5   Tender   Tender  MTP 1   Tender   Tender  MTP 2   Tender   Tender  MTP 3   Tender   Tender  MTP 4   Tender   Tender  MTP 5   Tender   Tender     Investigation: No  additional findings.  Imaging: No results found.  Recent Labs: Lab Results  Component Value Date   WBC 6.7 04/30/2019   HGB 14.8 04/30/2019   PLT 172 04/30/2019   NA 143 04/30/2019   K 3.8 04/30/2019   CL 105 04/30/2019   CO2 26 04/30/2019   GLUCOSE 125 (H) 04/30/2019   BUN 11 04/30/2019   CREATININE 0.81 04/30/2019   BILITOT 0.4 02/10/2019   ALKPHOS 69 04/29/2017   AST 26 02/10/2019   ALT 37 (H) 02/10/2019   PROT 6.1 02/10/2019   ALBUMIN 4.2 04/29/2017   CALCIUM 9.5 04/30/2019   GFRAA >60 04/30/2019   QFTBGOLDPLUS NEGATIVE 02/10/2019    Speciality Comments: Need Hep C quant every 6 months Prior therapy: methotrexate (oral ulcers) and Cimzia (inadequate response)   Procedures:  Large Joint Inj: L glenohumeral on 07/13/2019 10:01 AM Indications: pain Details: 27 G 1.5 in needle, posterior approach  Arthrogram: No  Medications: 1.5 mL lidocaine 1 %; 40 mg triamcinolone acetonide 40 MG/ML Aspirate: 0 mL Outcome: tolerated well, no immediate complications Procedure, treatment alternatives, risks and benefits explained, specific risks discussed. Consent was given by the patient. Immediately prior to procedure a time out was called to verify the correct patient, procedure, equipment, support staff and site/side marked as required. Patient was prepped and draped in  the usual sterile fashion.     Allergies: Patient has no known allergies.  She received a prednisone in September and October.   Assessment / Plan:     Visit Diagnoses: Rheumatoid arthritis with rheumatoid factor of multiple sites without organ or systems involvement [facility](HCC): She has no obvious synovitis on exam today.  She has tenderness of bilateral wrist joints in all MCP joints, and MTP joints.  She presents today with ongoing left shoulder joint pain.  She is having difficulties with internal rotation and abduction due to discomfort.  X-rays of the left shoulder were obtained on 04/07/2019 which were unremarkable.  She requested a left shoulder joint cortisone injection.  She tolerated the procedure well.  The procedure note was completed above.  She is currently on Actemra 126 mg subcutaneous injections every 14 days and Arava 10 mg by mouth daily.  She is on a reduced dose of Arava due to history of elevated LFTs.  She had recent lab work on 06/26/2019 which revealed ALT of 40 and AST of 26.  She has been avoiding over-the-counter NSAIDs and Tylenol.  She continues to have persistent pain in both hands and both feet.  She is currently on prednisone taper and is taking 10 mg by mouth daily tapering by 5 mg every 4 days.  We discussed that once she completes the prednisone taper and sees how she does with the left shoulder joint cortisone injection we will schedule an ultrasound of both hands to assess for active synovitis.  If she has synovitis we discussed switching her from Actemra to Orencia in the future.  She will continue on the current treatment regimen for now.  She is advised to notify us if she develops increased joint pain or joint swelling.  She will follow-up in the office in 2 months for an ultrasound of both hands.  High risk medication use - Actemra 162 mg every 14 days and Arava 10 mg daily.  Last TB gold negative on 02/10/2019.  Most recent CBC/BMP from hospital encounter within normal  limits on 04/30/2019.  CMP showed elevated AST/ALT on 03/14/2019.  ALT was 40 on 06/26/2019.  AST within  normal limits.  Primary osteoarthritis of both hands: She has PIP and DIP synovial thickening consistent with osteoarthritis of both hands.  She has bilateral CMC joint synovial thickening.  She has complete fist formation bilaterally.  She has been having increased pain in both hands.  Primary osteoarthritis of both feet:  She has chronic pain in both feet.  She has osteoarthritic changes bilaterally.  She has tenderness of all MTP joints.  She wears proper fitting shoes.   Chronic left shoulder pain: She presents today with ongoing left shoulder pain.  She has pain and limited internal rotation and abduction.  X-rays of the left shoulder were unremarkable on 04/07/2019.  She requested a left shoulder joint cortisone injection today.  She tolerated the procedure well.  The procedure note was completed above.  Aftercare was discussed.  Sicca syndrome, unspecified (HCC): She is not having any worsening sicca symptoms at this time.  ANA positive: She has no other clinical features of autoimmune disease at this time.  Trochanteric bursitis of both hips: She has no tenderness over trochanteric bursa.  Other medical conditions are listed as follows  Tobacco abuse  History of seizures  History of COPD  History of hepatitis C  Orders: Orders Placed This Encounter  Procedures   Large Joint Inj   No orders of the defined types were placed in this encounter.   Face-to-face time spent with patient was 30 minutes. Greater than 50% of time was spent in counseling and coordination of care.  Follow-Up Instructions: Return in about 6 weeks (around 08/24/2019) for Rheumatoid arthritis, Osteoarthritis.   Gearldine Bienenstockaylor M Helaina Stefano, PA-C   I examined and evaluated the patient with Sherron Alesaylor Riven Beebe PA.  Patient had no synovitis on examination today.  Although she continues to have joint discomfort.  She is  currently on a prednisone taper.  She has been advised to return when she is off prednisone.  We may consider ultrasound to look for synovitis.  If she has persistent synovitis we may have to change her treatment plan.  The plan of care was discussed as noted above.  Pollyann SavoyShaili Deveshwar, MD Note - This record has been created using [employer]Dragon software.  Chart creation errors have been sought, but may not always  have been located. Such creation errors do not reflect on  the standard of medical care.

## 2019-06-30 NOTE — Telephone Encounter (Signed)
Last Visit: 04/07/19 Next Visit: 07/27/19 Labs: 04/30/19 cbc and bmp- glucose 125  Okay to refill per Dr. Estanislado Pandy

## 2019-07-01 DIAGNOSIS — I8310 Varicose veins of unspecified lower extremity with inflammation: Secondary | ICD-10-CM | POA: Diagnosis not present

## 2019-07-01 DIAGNOSIS — R5383 Other fatigue: Secondary | ICD-10-CM | POA: Diagnosis not present

## 2019-07-01 DIAGNOSIS — I872 Venous insufficiency (chronic) (peripheral): Secondary | ICD-10-CM | POA: Diagnosis not present

## 2019-07-01 DIAGNOSIS — E782 Mixed hyperlipidemia: Secondary | ICD-10-CM | POA: Diagnosis not present

## 2019-07-03 ENCOUNTER — Telehealth: Payer: Self-pay | Admitting: Rheumatology

## 2019-07-03 MED ORDER — PREDNISONE 5 MG PO TABS: ORAL_TABLET | ORAL | 0 refills | Status: DC

## 2019-07-03 NOTE — Telephone Encounter (Signed)
Patient states she is having pain in both shoulders and both hands and some swelling in her hands. Patient states her wrists are hurting as well. Patient states her Christina Jenkins was cut back to 10 mg due to her LFTS. Patient is also taking Actemra. Patient denies missing any doses. Patient is requesting a prescription for prednisone.

## 2019-07-03 NOTE — Telephone Encounter (Signed)
Per Hazel Sams PA-C okay to send in prescription for prednisone taper.  Patient advised.

## 2019-07-03 NOTE — Telephone Encounter (Signed)
Patient called requesting prescription of Prednisone to be sent to [facility] at [place] in [place].

## 2019-07-13 ENCOUNTER — Encounter: Payer: Self-pay | Admitting: Rheumatology

## 2019-07-13 ENCOUNTER — Ambulatory Visit (INDEPENDENT_AMBULATORY_CARE_PROVIDER_SITE_OTHER): Payer: Medicare Other | Admitting: Rheumatology

## 2019-07-13 ENCOUNTER — Telehealth: Payer: Self-pay | Admitting: Pharmacy Technician

## 2019-07-13 ENCOUNTER — Other Ambulatory Visit: Payer: Self-pay

## 2019-07-13 VITALS — BP 139/81 | HR 89 | Resp 15 | Ht 66.0 in | Wt 239.2 lb

## 2019-07-13 DIAGNOSIS — M19071 Primary osteoarthritis, right ankle and foot: Secondary | ICD-10-CM

## 2019-07-13 DIAGNOSIS — M7062 Trochanteric bursitis, left hip: Secondary | ICD-10-CM

## 2019-07-13 DIAGNOSIS — M19042 Primary osteoarthritis, left hand: Secondary | ICD-10-CM

## 2019-07-13 DIAGNOSIS — Z79899 Other long term (current) drug therapy: Secondary | ICD-10-CM | POA: Diagnosis not present

## 2019-07-13 DIAGNOSIS — M25512 Pain in left shoulder: Secondary | ICD-10-CM

## 2019-07-13 DIAGNOSIS — M3500 Sicca syndrome, unspecified: Secondary | ICD-10-CM

## 2019-07-13 DIAGNOSIS — M0579 Rheumatoid arthritis with rheumatoid factor of multiple sites without organ or systems involvement: Secondary | ICD-10-CM | POA: Diagnosis not present

## 2019-07-13 DIAGNOSIS — Z8709 Personal history of other diseases of the respiratory system: Secondary | ICD-10-CM

## 2019-07-13 DIAGNOSIS — M7061 Trochanteric bursitis, right hip: Secondary | ICD-10-CM

## 2019-07-13 DIAGNOSIS — M19041 Primary osteoarthritis, right hand: Secondary | ICD-10-CM | POA: Diagnosis not present

## 2019-07-13 DIAGNOSIS — G8929 Other chronic pain: Secondary | ICD-10-CM

## 2019-07-13 DIAGNOSIS — Z720 Tobacco use: Secondary | ICD-10-CM

## 2019-07-13 DIAGNOSIS — M19072 Primary osteoarthritis, left ankle and foot: Secondary | ICD-10-CM

## 2019-07-13 DIAGNOSIS — Z8619 Personal history of other infectious and parasitic diseases: Secondary | ICD-10-CM

## 2019-07-13 DIAGNOSIS — R768 Other specified abnormal immunological findings in serum: Secondary | ICD-10-CM

## 2019-07-13 DIAGNOSIS — Z87898 Personal history of other specified conditions: Secondary | ICD-10-CM

## 2019-07-13 NOTE — Telephone Encounter (Signed)
Submitted Patient Assistance Application to [place] for [place]. Will follow up to confirm receipt.   Will send documents to scan center.  Fax# [phone] Phone# [phone]

## 2019-07-13 NOTE — Telephone Encounter (Signed)
Patient was in office for a visit and completed Actemra renewal application. Will completed prescriber portion and will fax. Will update once we receive a response.  10:36 AM Beatriz Chancellor, CPhT

## 2019-07-14 ENCOUNTER — Ambulatory Visit: Payer: Medicare Other | Admitting: Rheumatology

## 2019-07-15 DIAGNOSIS — I700 Atherosclerosis of aorta: Secondary | ICD-10-CM | POA: Diagnosis not present

## 2019-07-15 DIAGNOSIS — J439 Emphysema, unspecified: Secondary | ICD-10-CM | POA: Diagnosis not present

## 2019-07-27 ENCOUNTER — Encounter: Payer: Self-pay | Admitting: Cardiovascular Disease

## 2019-07-27 ENCOUNTER — Other Ambulatory Visit: Payer: Self-pay

## 2019-07-27 ENCOUNTER — Ambulatory Visit (INDEPENDENT_AMBULATORY_CARE_PROVIDER_SITE_OTHER): Payer: Medicare Other | Admitting: Cardiovascular Disease

## 2019-07-27 VITALS — BP 148/92 | HR 92 | Ht 66.0 in | Wt 238.0 lb

## 2019-07-27 DIAGNOSIS — R9439 Abnormal result of other cardiovascular function study: Secondary | ICD-10-CM | POA: Diagnosis not present

## 2019-07-27 DIAGNOSIS — Z720 Tobacco use: Secondary | ICD-10-CM | POA: Diagnosis not present

## 2019-07-27 DIAGNOSIS — R030 Elevated blood-pressure reading, without diagnosis of hypertension: Secondary | ICD-10-CM | POA: Diagnosis not present

## 2019-07-27 DIAGNOSIS — R079 Chest pain, unspecified: Secondary | ICD-10-CM

## 2019-07-27 DIAGNOSIS — I83893 Varicose veins of bilateral lower extremities with other complications: Secondary | ICD-10-CM | POA: Diagnosis not present

## 2019-07-27 DIAGNOSIS — J449 Chronic obstructive pulmonary disease, unspecified: Secondary | ICD-10-CM

## 2019-07-27 DIAGNOSIS — E785 Hyperlipidemia, unspecified: Secondary | ICD-10-CM

## 2019-07-27 DIAGNOSIS — R600 Localized edema: Secondary | ICD-10-CM

## 2019-07-27 NOTE — Patient Instructions (Signed)
Medication Instructions:  Continue all current medications.  Labwork: none  Testing/Procedures: none  Follow-Up: As needed.    Any Other Special Instructions Will Be Listed Below (If Applicable).  If you need a refill on your cardiac medications before your next appointment, please call your pharmacy.  

## 2019-07-27 NOTE — Progress Notes (Signed)
SUBJECTIVE: The patient presents routine follow-up.  She underwent coronary angiography on 04/30/2019 for an abnormal nuclear stress test which demonstrated ischemia in the RCA territory.  She had normal coronary arteries with mildly elevated filling pressures with LVEDP of 26.  She denies chest pain, palpitations, shortness of breath and leg swelling.    Review of Systems: As per "subjective", otherwise negative.  No Known Allergies  Current Outpatient Medications  Medication Sig Dispense Refill  . cetirizine (ZYRTEC) 10 MG tablet Take 10 mg by mouth daily.    . DULoxetine (CYMBALTA) 30 MG capsule Take 60 mg by mouth daily.   0  . furosemide (LASIX) 40 MG tablet Take 40 mg by mouth daily.    Marland Kitchen leflunomide (ARAVA) 10 MG tablet TAKE 1 TABLET BY MOUTH EVERY DAY 90 tablet 0  . levETIRAcetam (KEPPRA XR) 500 MG 24 hr tablet Take 1 tablet (500 mg total) by mouth daily. 30 tablet 0  . Multiple Vitamin (MULTIVITAMIN) tablet Take 1 tablet by mouth daily.    . Omega-3 Fatty Acids (FISH OIL) 1000 MG CAPS Take 1,000 mg by mouth daily.     . potassium chloride SA (K-DUR) 20 MEQ tablet Take 20 mEq by mouth daily.    . rosuvastatin (CRESTOR) 20 MG tablet Take 20 mg by mouth daily.    . Tocilizumab (ACTEMRA) 162 MG/0.9ML SOSY Inject 162 mg into the skin every 14 (fourteen) days. INJECT SUBCUTANEOUSLY 162MG  EVERY OTHER WEEK 5.4 mL 0   Current Facility-Administered Medications  Medication Dose Route Frequency Provider Last Rate Last Dose  . sodium chloride flush (NS) 0.9 % injection 3 mL  3 mL Intravenous Q12H , MD        Past Medical History:  Diagnosis Date  . Arthritis 02/01/16   dx w/rheumatoid arthritis  . Carpal tunnel syndrome 10/20/2015   right  . Carpal tunnel syndrome on both sides   . Dysuria   . Hypokalemia   . Liver disease    hepatitis c  . Seasonal allergies   . Seizures (HCC)    last 05/05/15    Past Surgical History:  Procedure Laterality Date  .  LEFT HEART CATH AND CORONARY ANGIOGRAPHY N/A 04/30/2019   Procedure: LEFT HEART CATH AND CORONARY ANGIOGRAPHY;  Surgeon: 05/02/2019, MD;  Location: [facility];  Service: Cardiovascular;  Laterality: N/A;  . TONSILLECTOMY      Social History   Socioeconomic History  . Marital status: Married    Spouse name: Not on file  . Number of children: Not on file  . Years of education: Not on file  . Highest education level: Not on file  Occupational History  . Not on file  Social Needs  . Financial resource strain: Not on file  . Food insecurity    Worry: Not on file    Inability: Not on file  . Transportation needs    Medical: Not on file    Non-medical: Not on file  Tobacco Use  . Smoking status: Current Every Day Smoker    Packs/day: 1.00    Types: Cigarettes  . Smokeless tobacco: Never Used  Substance and Sexual Activity  . Alcohol use: No  . Drug use: No  . Sexual activity: Not on file  Lifestyle  . Physical activity    Days per week: Not on file    Minutes per session: Not on file  . Stress: Not on file  Relationships  .  Social Herbalist on phone: Not on file    Gets together: Not on file    Attends religious service: Not on file    Active member of club or organization: Not on file    Attends meetings of clubs or organizations: Not on file    Relationship status: Not on file  . Intimate partner violence    Fear of current or ex partner: Not on file    Emotionally abused: Not on file    Physically abused: Not on file    Forced sexual activity: Not on file  Other Topics Concern  . Not on file  Social History Narrative  . Not on file     Vitals:   07/27/19 1532  BP: (!) 148/92  Pulse: 92  SpO2: 98%  Weight: 238 lb (108 kg)  Height: 5\' 6"  (1.676 m)    Wt Readings from Last 3 Encounters:  07/27/19 238 lb (108 kg)  07/13/19 239 lb 3.2 oz (108.5 kg)  04/30/19 237 lb (107.5 kg)     PHYSICAL EXAM General: NAD HEENT: Normal.  Neck: No JVD, no thyromegaly. Lungs: Clear to auscultation bilaterally with normal respiratory effort. CV: Regular rate and rhythm, normal S1/S2, no S3/S4, no murmur. No pretibial or periankle edema.  No carotid bruit.   Abdomen: Soft, nontender, no distention.  Neurologic: Alert and oriented.  Psych: Normal affect. Skin: Normal. Musculoskeletal: No gross deformities.      Labs: Lab Results  Component Value Date/Time   K 3.8 04/30/2019 08:47 AM   BUN 11 04/30/2019 08:47 AM   CREATININE 0.81 04/30/2019 08:47 AM   CREATININE 0.75 02/10/2019 11:15 AM   ALT 37 (H) 02/10/2019 11:15 AM   TSH 3.930 11/02/2015 02:35 PM   HGB 14.8 04/30/2019 08:47 AM     Lipids: Lab Results  Component Value Date/Time   LDLCALC 49 02/10/2019 11:15 AM   CHOL 115 02/10/2019 11:15 AM   TRIG 126 02/10/2019 11:15 AM   HDL 45 (L) 02/10/2019 11:15 AM       ASSESSMENT AND PLAN: 1.  Chest pain/abnormal nuclear stress test: No symptom recurrence.  Coronary angiography was normal indicating a false positive stress test.  2.  Hypertension: Blood pressure is elevated.  She said it was normal at her PCPs office earlier this month.  This will need further management by her PCP.  3.  Hyperlipidemia: On rosuvastatin.  4.  COPD: Stable.  5.  Tobacco abuse: She needs cessation.  She has smoked a pack of cigarettes daily for 35 years.  6.  Bilateral leg swelling: Due to venous insufficiency.  I have recommended compression stockings.   Disposition: Follow up as needed   Kate Sable, M.D., F.A.C.C.

## 2019-08-24 ENCOUNTER — Telehealth: Payer: Self-pay | Admitting: Rheumatology

## 2019-08-24 NOTE — Telephone Encounter (Signed)
Patient called stating since decreasing her Arava medication she has been experiencing pain in her shoulders and hands.  Patient is requesting a prescription of Prednisone to be sent to [facility] at [place].

## 2019-08-24 NOTE — Telephone Encounter (Signed)
Patient states she is having pain in bilateral shoulders, pain and swelling in bilateral hands. Patient states she received a shoulder injection at her visit on 07/13/2019 which she did get relief from. Patient states she has been taking actemra and arava on schedule and has not missed any doses. Patient is requesting a prednisone taper. Please advise.

## 2019-08-25 MED ORDER — PREDNISONE 5 MG PO TABS: ORAL_TABLET | ORAL | 0 refills | Status: DC

## 2019-08-25 NOTE — Telephone Encounter (Signed)
Prednisone taper sent to the pharmacy, patient is aware. Advised patient to contact the office if symptoms persist after prednisone taper, she verbalized understanding.

## 2019-08-25 NOTE — Telephone Encounter (Signed)
Okay to give prednisone taper starting at 20 mg and taper by 5 mg every 2 days.

## 2019-09-03 DIAGNOSIS — E782 Mixed hyperlipidemia: Secondary | ICD-10-CM | POA: Diagnosis not present

## 2019-09-03 DIAGNOSIS — I10 Essential (primary) hypertension: Secondary | ICD-10-CM | POA: Diagnosis not present

## 2019-09-15 ENCOUNTER — Telehealth: Payer: Self-pay | Admitting: Rheumatology

## 2019-09-15 MED ORDER — ACTEMRA 162 MG/0.9ML ~~LOC~~ SOSY: 162.0000 mg | PREFILLED_SYRINGE | SUBCUTANEOUS | 0 refills | Status: DC

## 2019-09-15 NOTE — Telephone Encounter (Signed)
Last Visit: 07/13/2019 Next Visit: 09/23/2019 Labs:06/26/2019 CMP glucose 117, ALT 40. TB Gold: 02/10/2019 negative   Okay to refill actemra?

## 2019-09-15 NOTE — Telephone Encounter (Signed)
Ok to refill Actemra

## 2019-09-15 NOTE — Telephone Encounter (Signed)
Patient called requesting prescription refill of Actemra to be sent to patient assistance.

## 2019-09-21 NOTE — Progress Notes (Signed)
Virtual Visit via Telephone Note  I connected with Christina Jenkins on 09/23/19 at  2:00 PM EST by telephone and verified that I am speaking with the correct person using two identifiers.  Location: Patient: Home  Provider: Clinic This service was conducted via virtual visit.  The patient was located at home. I was located in my office.  Consent was obtained prior to the virtual visit and is aware of possible charges through their insurance for this visit.  The patient is an established patient.  Dr. Estanislado Pandy, MD conducted the virtual visit and Hazel Sams, PA-C acted as scribe during the service.  Office staff helped with scheduling follow up visits after the service was conducted.   I discussed the limitations, risks, security and privacy concerns of performing an evaluation and management service by telephone and the availability of in person appointments. I also discussed with the patient that there may be a patient responsible charge related to this service. The patient expressed understanding and agreed to proceed.  CC: Left shoulder joint pain  History of Present Illness: Christina Jenkins is a 59 y.o. female with history of seropositive rheumatoid arthritis and osteoarthritis.  She is on Actemra 162 mg sq injections every 14 days and Arava 10 mg po daily. She has not missed any doses. She has ongoing left shoulder joint pain. She had a left shoulder joint injection on 07/13/19, which provided temporary relief. She completed a prednisone taper in December 2020, which improved her joint pain.  She is currently having pain in both hands.  She denies any joint swelling.  She denies any knee joint or feet pain at this time.    Review of Systems  Constitutional: Negative for fever and malaise/fatigue.  HENT: Negative for ear pain.   Eyes: Negative for photophobia, pain, discharge and redness.  Respiratory: Positive for cough. Negative for shortness of breath and wheezing.   Cardiovascular:  Negative for chest pain and palpitations.  Gastrointestinal: Negative for blood in stool, constipation and diarrhea.  Genitourinary: Negative for dysuria and urgency.  Musculoskeletal: Positive for joint pain. Negative for back pain, myalgias and neck pain.       +Morning stiffness   Skin: Negative for rash.  Neurological: Negative for dizziness, weakness and headaches.  Endo/Heme/Allergies: Does not bruise/bleed easily.  Psychiatric/Behavioral: Negative for depression. The patient is not nervous/anxious and does not have insomnia.    Observations/Objective:  Physical Exam  Constitutional: She is oriented to person, place, and time.  Neurological: She is alert and oriented to person, place, and time.  Psychiatric: Mood, memory, affect and judgment normal.     Patient reports morning stiffness for 1  hour.   Patient reports nocturnal pain.  Difficulty dressing/grooming: Denies Difficulty climbing stairs: Denies Difficulty getting out of chair: Denies Difficulty using hands for taps, buttons, cutlery, and/or writing: Denies  Assessment and Plan: Visit Diagnoses: Rheumatoid arthritis with rheumatoid factor of multiple sites without organ or systems involvement ([place]): She has intermittent pain in both hands and the left shoulder joint. She has no joint inflammation at this time.  She is on Actemra 162 mg sq injections once every 14 days and Arava 10 mg daily.  She had to reduce the dose of Arava due to elevated LFTs. She has been avoiding taking NSAIDs and tylenol.  We previously discussed scheduling an ultrasound of both hands to assess for synovitis, but the appointment was canceled due to only scheduling virtual visits during the covid-19 pandemic.  She had  a left shoulder joint cortisone injection on 07/13/19, which provided temporary relief. She completed a prednisone taper in December 2020.  She has persistent left shoulder joint pain and would like to schedule a repeat cortisone  injection. We will refer her to PT as well. She will continue on the current treatment regimen.  She was advised to notify us if she develops increased joint pain or joint swelling. She will follow up in 4 months.   High risk medication use - Actemra 162 mg every 14 days and Arava 10 mg daily (reduced dose of elevated LFTs).  Last TB gold negative on 02/10/2019.  She is having lab work drawn at her PCPs office next week and will have the results faxed to our office.  We discussed the importance of having lab work every 3 months.   Chronic left shoulder pain: She has ongoing left shoulder joint pain.  She has difficulty with ROM due to the discomfort. She has been experiencing nocturnal pain.  She had a left shoulder joint cortisone injection on 07/13/19, which provided temporary relief. She completed a prednisone taper in December, which improved her symptoms temporarily. She is unable to take NSAIDs/tyelnol due to elevated LFTs.  She would like to return for a cortisone injection in February 2021.  We will mail a handout of shoulder joint exercises to start performing and refer her to physical therapy.  If she continues to have severe left shoulder joint pain we will schedule a MRI of the left shoulder joint.    Primary osteoarthritis of both hands: She has been experiencing pain in both hands for the past 3 days.  No joint swelling.  She experiencing intermittent pain and stiffness.  Joint protection and muscle strengthening were discussed.   Primary osteoarthritis of both feet: She is not having any feet pain or inflammation currently.   Sicca syndrome, unspecified (HCC): She is not having any sicca symptoms at this time.   ANA positive: She has no other clinical features of autoimmune disease at this time.  Trochanteric bursitis of both hips  Other medical conditions are listed as follows  Tobacco abuse  History of seizures  History of COPD  History of hepatitis C  Follow Up  Instructions: She will follow up in 4 months   I discussed the assessment and treatment plan with the patient. The patient was provided an opportunity to ask questions and all were answered. The patient agreed with the plan and demonstrated an understanding of the instructions.   The patient was advised to call back or seek an in-person evaluation if the symptoms worsen or if the condition fails to improve as anticipated.  I provided 25 minutes of non-face-to-face time during this encounter.  Pollyann Savoy, MD   Scribed by-  Sherron Ales PA-C

## 2019-09-23 ENCOUNTER — Telehealth (INDEPENDENT_AMBULATORY_CARE_PROVIDER_SITE_OTHER): Payer: Medicare Other | Admitting: Rheumatology

## 2019-09-23 ENCOUNTER — Encounter: Payer: Self-pay | Admitting: Rheumatology

## 2019-09-23 ENCOUNTER — Other Ambulatory Visit: Payer: Self-pay | Admitting: *Deleted

## 2019-09-23 ENCOUNTER — Other Ambulatory Visit: Payer: Self-pay

## 2019-09-23 DIAGNOSIS — Z720 Tobacco use: Secondary | ICD-10-CM

## 2019-09-23 DIAGNOSIS — M19071 Primary osteoarthritis, right ankle and foot: Secondary | ICD-10-CM

## 2019-09-23 DIAGNOSIS — M3500 Sicca syndrome, unspecified: Secondary | ICD-10-CM | POA: Diagnosis not present

## 2019-09-23 DIAGNOSIS — Z79899 Other long term (current) drug therapy: Secondary | ICD-10-CM | POA: Diagnosis not present

## 2019-09-23 DIAGNOSIS — R768 Other specified abnormal immunological findings in serum: Secondary | ICD-10-CM

## 2019-09-23 DIAGNOSIS — M0579 Rheumatoid arthritis with rheumatoid factor of multiple sites without organ or systems involvement: Secondary | ICD-10-CM | POA: Diagnosis not present

## 2019-09-23 DIAGNOSIS — M19042 Primary osteoarthritis, left hand: Secondary | ICD-10-CM

## 2019-09-23 DIAGNOSIS — G8929 Other chronic pain: Secondary | ICD-10-CM

## 2019-09-23 DIAGNOSIS — Z87898 Personal history of other specified conditions: Secondary | ICD-10-CM

## 2019-09-23 DIAGNOSIS — M7062 Trochanteric bursitis, left hip: Secondary | ICD-10-CM

## 2019-09-23 DIAGNOSIS — M25512 Pain in left shoulder: Secondary | ICD-10-CM

## 2019-09-23 DIAGNOSIS — Z8709 Personal history of other diseases of the respiratory system: Secondary | ICD-10-CM

## 2019-09-23 DIAGNOSIS — M19041 Primary osteoarthritis, right hand: Secondary | ICD-10-CM

## 2019-09-23 DIAGNOSIS — Z8619 Personal history of other infectious and parasitic diseases: Secondary | ICD-10-CM

## 2019-09-23 DIAGNOSIS — M7061 Trochanteric bursitis, right hip: Secondary | ICD-10-CM

## 2019-09-23 DIAGNOSIS — M19072 Primary osteoarthritis, left ankle and foot: Secondary | ICD-10-CM

## 2019-09-23 NOTE — Patient Instructions (Signed)
Shoulder Exercises Ask your health care provider which exercises are safe for you. Do exercises exactly as told by your health care provider and adjust them as directed. It is normal to feel mild stretching, pulling, tightness, or discomfort as you do these exercises. Stop right away if you feel sudden pain or your pain gets worse. Do not begin these exercises until told by your health care provider. Stretching exercises External rotation and abduction This exercise is sometimes called corner stretch. This exercise rotates your arm outward (external rotation) and moves your arm out from your body (abduction). 1. Stand in a doorway with one of your feet slightly in front of the other. This is called a staggered stance. If you cannot reach your forearms to the door frame, stand facing a corner of a room. 2. Choose one of the following positions as told by your health care provider: ? Place your hands and forearms on the door frame above your head. ? Place your hands and forearms on the door frame at the height of your head. ? Place your hands on the door frame at the height of your elbows. 3. Slowly move your weight onto your front foot until you feel a stretch across your chest and in the front of your shoulders. Keep your head and chest upright and keep your abdominal muscles tight. 4. Hold for __________ seconds. 5. To release the stretch, shift your weight to your back foot. Repeat __________ times. Complete this exercise __________ times a day. Extension, standing 1. Stand and hold a broomstick, a cane, or a similar object behind your back. ? Your hands should be a little wider than shoulder width apart. ? Your palms should face away from your back. 2. Keeping your elbows straight and your shoulder muscles relaxed, move the stick away from your body until you feel a stretch in your shoulders (extension). ? Avoid shrugging your shoulders while you move the stick. Keep your shoulder blades tucked  down toward the middle of your back. 3. Hold for __________ seconds. 4. Slowly return to the starting position. Repeat __________ times. Complete this exercise __________ times a day. Range-of-motion exercises Pendulum  1. Stand near a wall or a surface that you can hold onto for balance. 2. Bend at the waist and let your left / right arm hang straight down. Use your other arm to support you. Keep your back straight and do not lock your knees. 3. Relax your left / right arm and shoulder muscles, and move your hips and your trunk so your left / right arm swings freely. Your arm should swing because of the motion of your body, not because you are using your arm or shoulder muscles. 4. Keep moving your hips and trunk so your arm swings in the following directions, as told by your health care provider: ? Side to side. ? Forward and backward. ? In clockwise and counterclockwise circles. 5. Continue each motion for __________ seconds, or for as long as told by your health care provider. 6. Slowly return to the starting position. Repeat __________ times. Complete this exercise __________ times a day. Shoulder flexion, standing  1. Stand and hold a broomstick, a cane, or a similar object. Place your hands a little more than shoulder width apart on the object. Your left / right hand should be palm up, and your other hand should be palm down. 2. Keep your elbow straight and your shoulder muscles relaxed. Push the stick up with your healthy arm to   raise your left / right arm in front of your body, and then over your head until you feel a stretch in your shoulder (flexion). ? Avoid shrugging your shoulder while you raise your arm. Keep your shoulder blade tucked down toward the middle of your back. 3. Hold for __________ seconds. 4. Slowly return to the starting position. Repeat __________ times. Complete this exercise __________ times a day. Shoulder abduction, standing 1. Stand and hold a broomstick,  a cane, or a similar object. Place your hands a little more than shoulder width apart on the object. Your left / right hand should be palm up, and your other hand should be palm down. 2. Keep your elbow straight and your shoulder muscles relaxed. Push the object across your body toward your left / right side. Raise your left / right arm to the side of your body (abduction) until you feel a stretch in your shoulder. ? Do not raise your arm above shoulder height unless your health care provider tells you to do that. ? If directed, raise your arm over your head. ? Avoid shrugging your shoulder while you raise your arm. Keep your shoulder blade tucked down toward the middle of your back. 3. Hold for __________ seconds. 4. Slowly return to the starting position. Repeat __________ times. Complete this exercise __________ times a day. Internal rotation  1. Place your left / right hand behind your back, palm up. 2. Use your other hand to dangle an exercise band, a towel, or a similar object over your shoulder. Grasp the band with your left / right hand so you are holding on to both ends. 3. Gently pull up on the band until you feel a stretch in the front of your left / right shoulder. The movement of your arm toward the center of your body is called internal rotation. ? Avoid shrugging your shoulder while you raise your arm. Keep your shoulder blade tucked down toward the middle of your back. 4. Hold for __________ seconds. 5. Release the stretch by letting go of the band and lowering your hands. Repeat __________ times. Complete this exercise __________ times a day. Strengthening exercises External rotation  1. Sit in a stable chair without armrests. 2. Secure an exercise band to a stable object at elbow height on your left / right side. 3. Place a soft object, such as a folded towel or a small pillow, between your left / right upper arm and your body to move your elbow about 4 inches (10 cm) away  from your side. 4. Hold the end of the exercise band so it is tight and there is no slack. 5. Keeping your elbow pressed against the soft object, slowly move your forearm out, away from your abdomen (external rotation). Keep your body steady so only your forearm moves. 6. Hold for __________ seconds. 7. Slowly return to the starting position. Repeat __________ times. Complete this exercise __________ times a day. Shoulder abduction  1. Sit in a stable chair without armrests, or stand up. 2. Hold a __________ weight in your left / right hand, or hold an exercise band with both hands. 3. Start with your arms straight down and your left / right palm facing in, toward your body. 4. Slowly lift your left / right hand out to your side (abduction). Do not lift your hand above shoulder height unless your health care provider tells you that this is safe. ? Keep your arms straight. ? Avoid shrugging your shoulder while you   do this movement. Keep your shoulder blade tucked down toward the middle of your back. 5. Hold for __________ seconds. 6. Slowly lower your arm, and return to the starting position. Repeat __________ times. Complete this exercise __________ times a day. Shoulder extension 1. Sit in a stable chair without armrests, or stand up. 2. Secure an exercise band to a stable object in front of you so it is at shoulder height. 3. Hold one end of the exercise band in each hand. Your palms should face each other. 4. Straighten your elbows and lift your hands up to shoulder height. 5. Step back, away from the secured end of the exercise band, until the band is tight and there is no slack. 6. Squeeze your shoulder blades together as you pull your hands down to the sides of your thighs (extension). Stop when your hands are straight down by your sides. Do not let your hands go behind your body. 7. Hold for __________ seconds. 8. Slowly return to the starting position. Repeat __________ times.  Complete this exercise __________ times a day. Shoulder row 1. Sit in a stable chair without armrests, or stand up. 2. Secure an exercise band to a stable object in front of you so it is at waist height. 3. Hold one end of the exercise band in each hand. Position your palms so that your thumbs are facing the ceiling (neutral position). 4. Bend each of your elbows to a 90-degree angle (right angle) and keep your upper arms at your sides. 5. Step back until the band is tight and there is no slack. 6. Slowly pull your elbows back behind you. 7. Hold for __________ seconds. 8. Slowly return to the starting position. Repeat __________ times. Complete this exercise __________ times a day. Shoulder press-ups  1. Sit in a stable chair that has armrests. Sit upright, with your feet flat on the floor. 2. Put your hands on the armrests so your elbows are bent and your fingers are pointing forward. Your hands should be about even with the sides of your body. 3. Push down on the armrests and use your arms to lift yourself off the chair. Straighten your elbows and lift yourself up as much as you comfortably can. ? Move your shoulder blades down, and avoid letting your shoulders move up toward your ears. ? Keep your feet on the ground. As you get stronger, your feet should support less of your body weight as you lift yourself up. 4. Hold for __________ seconds. 5. Slowly lower yourself back into the chair. Repeat __________ times. Complete this exercise __________ times a day. Wall push-ups  1. Stand so you are facing a stable wall. Your feet should be about one arm-length away from the wall. 2. Lean forward and place your palms on the wall at shoulder height. 3. Keep your feet flat on the floor as you bend your elbows and lean forward toward the wall. 4. Hold for __________ seconds. 5. Straighten your elbows to push yourself back to the starting position. Repeat __________ times. Complete this exercise  __________ times a day. This information is not intended to replace advice given to you by your health care provider. Make sure you discuss any questions you have with your health care provider. Document Revised: 12/12/2018 Document Reviewed: 09/19/2018 Elsevier Patient Education  2020 [employer]  

## 2019-09-29 DIAGNOSIS — R5383 Other fatigue: Secondary | ICD-10-CM | POA: Diagnosis not present

## 2019-09-29 DIAGNOSIS — I10 Essential (primary) hypertension: Secondary | ICD-10-CM | POA: Diagnosis not present

## 2019-09-29 DIAGNOSIS — E782 Mixed hyperlipidemia: Secondary | ICD-10-CM | POA: Diagnosis not present

## 2019-09-29 DIAGNOSIS — R739 Hyperglycemia, unspecified: Secondary | ICD-10-CM | POA: Diagnosis not present

## 2019-10-02 ENCOUNTER — Other Ambulatory Visit: Payer: Self-pay | Admitting: Rheumatology

## 2019-10-02 DIAGNOSIS — E782 Mixed hyperlipidemia: Secondary | ICD-10-CM | POA: Diagnosis not present

## 2019-10-02 DIAGNOSIS — I10 Essential (primary) hypertension: Secondary | ICD-10-CM | POA: Diagnosis not present

## 2019-10-02 DIAGNOSIS — I8310 Varicose veins of unspecified lower extremity with inflammation: Secondary | ICD-10-CM | POA: Diagnosis not present

## 2019-10-02 DIAGNOSIS — E7849 Other hyperlipidemia: Secondary | ICD-10-CM | POA: Diagnosis not present

## 2019-10-02 DIAGNOSIS — I872 Venous insufficiency (chronic) (peripheral): Secondary | ICD-10-CM | POA: Diagnosis not present

## 2019-10-02 DIAGNOSIS — R5383 Other fatigue: Secondary | ICD-10-CM | POA: Diagnosis not present

## 2019-10-02 DIAGNOSIS — J449 Chronic obstructive pulmonary disease, unspecified: Secondary | ICD-10-CM | POA: Diagnosis not present

## 2019-10-02 NOTE — Telephone Encounter (Signed)
Last Visit: 09/23/19 Next Visit: 01/27/20 Labs:06/26/2019 CMP glucose 117, ALT 40.  Patient advised she is due to update labs. Patient states she update with them and they will fax.   Okay to refill per Dr. Corliss Skains

## 2019-10-06 DIAGNOSIS — M25562 Pain in left knee: Secondary | ICD-10-CM | POA: Diagnosis not present

## 2019-10-06 DIAGNOSIS — M069 Rheumatoid arthritis, unspecified: Secondary | ICD-10-CM | POA: Diagnosis not present

## 2019-10-14 ENCOUNTER — Ambulatory Visit (INDEPENDENT_AMBULATORY_CARE_PROVIDER_SITE_OTHER): Payer: Medicare Other | Admitting: Rheumatology

## 2019-10-14 ENCOUNTER — Other Ambulatory Visit: Payer: Self-pay

## 2019-10-14 VITALS — BP 147/82 | HR 77

## 2019-10-14 DIAGNOSIS — M25512 Pain in left shoulder: Secondary | ICD-10-CM | POA: Diagnosis not present

## 2019-10-14 DIAGNOSIS — G8929 Other chronic pain: Secondary | ICD-10-CM

## 2019-10-14 MED ORDER — TRIAMCINOLONE ACETONIDE 40 MG/ML IJ SUSP
40.0000 mg | INTRAMUSCULAR | Status: AC | PRN
  Administered 2019-10-14: 40 mg via INTRA_ARTICULAR

## 2019-10-14 MED ORDER — LIDOCAINE HCL 1 % IJ SOLN
1.5000 mL | INTRAMUSCULAR | Status: AC | PRN
  Administered 2019-10-14: 1.5 mL

## 2019-10-14 NOTE — Progress Notes (Signed)
   Procedure Note  Patient: Christina Jenkins             Date of Birth: 02-28-1961           MRN: [id number]             Visit Date: 10/14/2019  Procedures: Visit Diagnoses:  1. Chronic left shoulder pain     Large Joint Inj: L glenohumeral on 10/14/2019 2:46 PM Indications: pain Details: 27 G 1.5 in needle, posterior approach  Arthrogram: No  Medications: 1.5 mL lidocaine 1 %; 40 mg triamcinolone acetonide 40 MG/ML Aspirate: 0 mL Outcome: tolerated well, no immediate complications Procedure, treatment alternatives, risks and benefits explained, specific risks discussed. Consent was given by the patient. Immediately prior to procedure a time out was called to verify the correct patient, procedure, equipment, support staff and site/side marked as required. Patient was prepped and draped in the usual sterile fashion.     Pollyann Savoy, MD

## 2019-10-14 NOTE — Progress Notes (Signed)
Medication Samples have been provided to the patient.  Drug name: actemra Strength: 162mg  Qty: 3 LOT Exp.Date: 11/2019  Dosing instructions: Inject 162mg  into the skin every 14 days.   The patient has been instructed regarding the correct time, dose, and frequency of taking this medication, including desired effects and most common side effects.   Ashlyne Olenick C Bethan Adamek 3:05 PM 10/14/2019

## 2019-10-15 ENCOUNTER — Ambulatory Visit: Payer: Medicare Other | Admitting: Physical Therapy

## 2019-10-30 DIAGNOSIS — I10 Essential (primary) hypertension: Secondary | ICD-10-CM | POA: Diagnosis not present

## 2019-10-30 DIAGNOSIS — E7849 Other hyperlipidemia: Secondary | ICD-10-CM | POA: Diagnosis not present

## 2019-11-11 ENCOUNTER — Telehealth: Payer: Self-pay | Admitting: Rheumatology

## 2019-11-11 NOTE — Telephone Encounter (Signed)
Patient advised she will not need to hold her medication to receive the Covid vaccination.

## 2019-11-11 NOTE — Telephone Encounter (Signed)
Patient called stating she is taking Actemra and Arava and is checking if it is safe for her to have the [employer] vaccine which is 1 shot.

## 2019-11-24 ENCOUNTER — Other Ambulatory Visit: Payer: Self-pay | Admitting: Rheumatology

## 2019-11-25 MED ORDER — PREDNISONE 5 MG PO TABS: ORAL_TABLET | ORAL | 0 refills | Status: DC

## 2019-11-25 NOTE — Telephone Encounter (Signed)
Patient states she is having pain and swelling in bilateral hands and feet. Patient states it has been going on for about 2 days. Patient is on Actemra and [place]. Patient denies missing any doses.   Last Visit: 09/23/19 Next Visit: 01/27/20  Okay to refill prednisone taper?

## 2019-11-25 NOTE — Telephone Encounter (Signed)
Please advise patient to schedule a sooner office visit to discuss other treatment options since she continues to have recurrent flares.   Ok to send in prednisone taper starting at 20 mg tapering by 5 mg every 2 days

## 2019-11-25 NOTE — Telephone Encounter (Signed)
Patient scheduled for 12/09/19 to discuss treatment options. Patient advised prescription for Prednisone being sent to the pharmacy.

## 2019-11-25 NOTE — Telephone Encounter (Signed)
Attempted to contact the patient and left message for patient to call the office.  

## 2019-12-02 DIAGNOSIS — E7849 Other hyperlipidemia: Secondary | ICD-10-CM | POA: Diagnosis not present

## 2019-12-02 DIAGNOSIS — I10 Essential (primary) hypertension: Secondary | ICD-10-CM | POA: Diagnosis not present

## 2019-12-07 NOTE — Progress Notes (Signed)
Office Visit Note  Patient: Christina Jenkins             Date of Birth: 1960-09-18           MRN: [id number]             PCP: Encarnacion Slates, PA-C Referring: Encarnacion Slates, PA-C Visit Date: 12/09/2019 Occupation: @GUAROCC @  Subjective:  Left shoulder joint pain   History of Present Illness: Christina Jenkins is a 59 y.o. female with history of seropositive rheumatoid arthritis and osteoarthritis.  She is on Actemra 162 mg sq injections every 14 days and arava 10 mg 1 tablet daily.  She presents today with ongoing pain in the left shoulder joint pain.  She had a cortisone injection on 10/14/19, which only provided relief for about 2 weeks.  She states in January she was evaluated for left knee joint pain and swelling. She states she had a left knee joint aspiration and cortisone injection in January.  She denies any other joint pain or joint swelling at this time.  She previously had inadequate response to Cimzia and discontinued MTX due to oral ulcers.  Activities of Daily Living:  Patient reports morning stiffness for 1 hour.   Patient Reports nocturnal pain.  Difficulty dressing/grooming: Denies Difficulty climbing stairs: Denies Difficulty getting out of chair: Denies Difficulty using hands for taps, buttons, cutlery, and/or writing: Denies  Review of Systems  Constitutional: Positive for fatigue.  HENT: Negative for mouth sores, mouth dryness and nose dryness.   Eyes: Negative for pain, itching, visual disturbance and dryness.  Respiratory: Negative for cough, hemoptysis, shortness of breath and difficulty breathing.   Cardiovascular: Negative for chest pain, palpitations, hypertension and swelling in legs/feet.  Gastrointestinal: Negative for blood in stool, constipation and diarrhea.  Endocrine: Negative for increased urination.  Genitourinary: Negative for difficulty urinating and painful urination.  Musculoskeletal: Positive for arthralgias, joint pain, joint swelling and morning  stiffness. Negative for myalgias, muscle weakness, muscle tenderness and myalgias.  Skin: Negative for color change, pallor, rash, hair loss, nodules/bumps, redness, skin tightness, ulcers and sensitivity to sunlight.  Allergic/Immunologic: Negative for susceptible to infections.  Neurological: Negative for dizziness, numbness, headaches, memory loss and weakness.  Hematological: Negative for bruising/bleeding tendency and swollen glands.  Psychiatric/Behavioral: Negative for depressed mood, confusion and sleep disturbance. The patient is not nervous/anxious.     PMFS History:  Patient Active Problem List   Diagnosis Date Noted  . Chest pain of uncertain etiology 04/30/2019  . Abnormal nuclear stress test   . History of COPD 05/31/2017  . Fatigue 12/04/2016  . History of seizures 10/23/2016  . History of hepatitis C 10/23/2016  . ANA positive 10/23/2016  . Sicca syndrome, unspecified (HCC) 10/23/2016  . Seropositive rheumatoid arthritis (HCC) 08/01/2016  . High risk medication use 08/01/2016  . Hepatitis C 08/01/2016  . Tobacco abuse 08/01/2016  . Depression 08/01/2016  . Complex partial seizure (HCC) 02/02/2016  . MCI (mild cognitive impairment) 11/02/2015  . Arthralgia 11/02/2015  . Carpal tunnel syndrome 10/20/2015  . Syncope 05/17/2015  . Convulsions/seizures (HCC) 05/17/2015    Past Medical History:  Diagnosis Date  . Arthritis 02/01/16   dx w/rheumatoid arthritis  . Carpal tunnel syndrome 10/20/2015   right  . Carpal tunnel syndrome on both sides   . Dysuria   . Hypokalemia   . Liver disease    hepatitis c  . Seasonal allergies   . Seizures (HCC)    last 05/05/15  Family History  Problem Relation Age of Onset  . Pancreatic cancer Mother   . COPD Father   . Diabetes Brother    Past Surgical History:  Procedure Laterality Date  . LEFT HEART CATH AND CORONARY ANGIOGRAPHY N/A 04/30/2019   Procedure: LEFT HEART CATH AND CORONARY ANGIOGRAPHY;  Surgeon: Lorretta Harp, MD;  Location: [facility];  Service: Cardiovascular;  Laterality: N/A;  . TONSILLECTOMY     Social History   Social History Narrative  . Not on file   Immunization History  Administered Date(s) Administered  . Influenza,inj,Quad PF,6+ Mos 05/16/2017, 05/15/2018  . Tdap 06/26/2018  . Zoster Recombinat (Shingrix) 09/04/2017     Objective: Vital Signs: BP 138/87 (BP Location: Left Arm, Patient Position: Sitting, Cuff Size: Normal)   Pulse 84   Resp 15   Ht 5\' 6"  (1.676 m)   Wt 228 lb (103.4 kg)   BMI 36.80 kg/m    Physical Exam Vitals and nursing note reviewed.  Constitutional:      Appearance: She is well-developed.  HENT:     Head: Normocephalic and atraumatic.  Eyes:     Conjunctiva/sclera: Conjunctivae normal.  Pulmonary:     Effort: Pulmonary effort is normal.  Abdominal:     General: Bowel sounds are normal.     Palpations: Abdomen is soft.  Musculoskeletal:     Cervical back: Normal range of motion.  Lymphadenopathy:     Cervical: No cervical adenopathy.  Skin:    General: Skin is warm and dry.     Capillary Refill: Capillary refill takes less than 2 seconds.  Neurological:     Mental Status: She is alert and oriented to person, place, and time.  Psychiatric:        Behavior: Behavior normal.      Musculoskeletal Exam: C-spine, thoracic spine, and lumbar spine good ROM.  Right shoulder full ROM.  Left shoulder painful and limited ROM.  Elbow joints, wrist joints, MCPs, PIPs, and DIPs good ROM with no synovitis. Complete fist formation bilaterally.  Hip joints, knee joints, ankle joints good ROM with no synovitis.  No warmth or effusion of knee joints.  No tenderness or swelling of ankle joints.   CDAI Exam: CDAI Score: -- Patient Global: --; Provider Global: -- Swollen: --; Tender: -- Joint Exam 12/09/2019   No joint exam has been documented for this visit   There is currently no information documented on the homunculus. Go to the  Rheumatology activity and complete the homunculus joint exam.  Investigation: No additional findings.  Imaging: No results found.  Recent Labs: Lab Results  Component Value Date   WBC 6.7 04/30/2019   HGB 14.8 04/30/2019   PLT 172 04/30/2019   NA 143 04/30/2019   K 3.8 04/30/2019   CL 105 04/30/2019   CO2 26 04/30/2019   GLUCOSE 125 (H) 04/30/2019   BUN 11 04/30/2019   CREATININE 0.81 04/30/2019   BILITOT 0.4 02/10/2019   ALKPHOS 69 04/29/2017   AST 26 02/10/2019   ALT 37 (H) 02/10/2019   PROT 6.1 02/10/2019   ALBUMIN 4.2 04/29/2017   CALCIUM 9.5 04/30/2019   GFRAA >60 04/30/2019   QFTBGOLDPLUS NEGATIVE 02/10/2019    Speciality Comments: Need Hep C quant every 6 months Prior therapy: methotrexate (oral ulcers) and Cimzia (inadequate response)   Procedures:  No procedures performed Allergies: Patient has no known allergies.   Assessment / Plan:     Visit Diagnoses: Rheumatoid arthritis with rheumatoid factor  of multiple sites without organ or systems involvement (HCC) - She has no synovitis on exam.  She presents today with persistent left shoulder joint pain.  She had a cortisone injection on 10/14/2019 which provided temporary relief for about 2 weeks.  We will proceed with scheduling an MRI of the left shoulder.  According to the patient in January 2021 she was experiencing left knee joint pain and swelling and was evaluated by her PCP.  She was referred to an orthopedist who performed a left knee joint aspiration and cortisone injection.  Both knee joints have good range of motion on exam today with no warmth or effusion.  She is not experiencing any other joint pain or inflammation at this time.  She is on Actemra 162 mg subcutaneous injections every 14 days and Arava 10 mg by mouth daily.  She is on the reduced dose of Arava due to elevated LFTs.  She will continue on the current treatment regimen at this time.  We will check a sed rate today.  We will notify her with the  MRI results of the left shoulder.  She was advised to notify us if her joint pain persists or worsens.  She will follow-up in the office in 3 months.  Plan: Sedimentation rate  High risk medication use - Actemra 162 mg every 14 days and Arava 10 mg daily (reduced dose of elevated LFTs).  According to the patient she had updated lab work in January 2021.  We will check CBC, CMP, and TB Gold today.  She will be due to update lab work in July and every 3 months to monitor for drug toxicity.- Plan: CBC with Differential/Platelet, COMPLETE METABOLIC PANEL WITH GFR, QuantiFERON-TB Gold Plus  Chronic left shoulder pain -She has persistent left shoulder joint pain.  She had a cortisone injection on 10/14/2019 which provided temporary relief for about 2 weeks.  She has tried performing shoulder exercises at home but has not noticed any improvement.  She has painful limited range of motion of the left shoulder joint on exam.  No obvious joint effusion was noted.  We will schedule an MRI of the left shoulder to further assess.  - Plan: MR SHOULDER LEFT WO CONTRAST  Primary osteoarthritis of both hands: She has no tenderness or synovitis at this time.  She experiences stiffness in both hands.  Joint protection and muscle strengthening were discussed.  Primary osteoarthritis of both feet: She is not experiencing any discomfort in her feet at this time.  She wears proper fitting shoes.  Sicca syndrome, unspecified (HCC): Her sicca symptoms have been tolerable recently.  ANA positive: She has no other clinical features of autoimmune disease at this time.  Trochanteric bursitis of both hips: She experiences intermittent trochanter bursitis.  She has no tenderness on exam today.  Other medical conditions are listed as follows:  History of COPD  History of seizures  History of hepatitis C  Tobacco abuse  Orders: Orders Placed This Encounter  Procedures  . MR SHOULDER LEFT WO CONTRAST  . CBC with  Differential/Platelet  . COMPLETE METABOLIC PANEL WITH GFR  . Sedimentation rate  . QuantiFERON-TB Gold Plus   No orders of the defined types were placed in this encounter.    Follow-Up Instructions: Return in about 5 months (around 05/10/2020) for Rheumatoid arthritis, Osteoarthritis.   Gearldine Bienenstock, PA-C   I examined and evaluated the patient with Sherron Ales PA.  She continues to have ongoing pain and discomfort.  She had no synovitis on examination today.  Will obtain additional labs.  The plan of care was discussed as noted above.  Pollyann Savoy, MD  Note - This record has been created using Animal nutritionist.  Chart creation errors have been sought, but may not always  have been located. Such creation errors do not reflect on  the standard of medical care.

## 2019-12-09 ENCOUNTER — Other Ambulatory Visit: Payer: Self-pay

## 2019-12-09 ENCOUNTER — Encounter: Payer: Self-pay | Admitting: Physician Assistant

## 2019-12-09 ENCOUNTER — Ambulatory Visit (INDEPENDENT_AMBULATORY_CARE_PROVIDER_SITE_OTHER): Payer: Medicare Other | Admitting: Rheumatology

## 2019-12-09 VITALS — BP 138/87 | HR 84 | Resp 15 | Ht 66.0 in | Wt 228.0 lb

## 2019-12-09 DIAGNOSIS — M25512 Pain in left shoulder: Secondary | ICD-10-CM

## 2019-12-09 DIAGNOSIS — M0579 Rheumatoid arthritis with rheumatoid factor of multiple sites without organ or systems involvement: Secondary | ICD-10-CM | POA: Diagnosis not present

## 2019-12-09 DIAGNOSIS — M19041 Primary osteoarthritis, right hand: Secondary | ICD-10-CM | POA: Diagnosis not present

## 2019-12-09 DIAGNOSIS — M7062 Trochanteric bursitis, left hip: Secondary | ICD-10-CM

## 2019-12-09 DIAGNOSIS — R768 Other specified abnormal immunological findings in serum: Secondary | ICD-10-CM

## 2019-12-09 DIAGNOSIS — M7061 Trochanteric bursitis, right hip: Secondary | ICD-10-CM

## 2019-12-09 DIAGNOSIS — G8929 Other chronic pain: Secondary | ICD-10-CM

## 2019-12-09 DIAGNOSIS — Z8709 Personal history of other diseases of the respiratory system: Secondary | ICD-10-CM

## 2019-12-09 DIAGNOSIS — Z79899 Other long term (current) drug therapy: Secondary | ICD-10-CM

## 2019-12-09 DIAGNOSIS — M19042 Primary osteoarthritis, left hand: Secondary | ICD-10-CM

## 2019-12-09 DIAGNOSIS — M19071 Primary osteoarthritis, right ankle and foot: Secondary | ICD-10-CM | POA: Diagnosis not present

## 2019-12-09 DIAGNOSIS — Z720 Tobacco use: Secondary | ICD-10-CM

## 2019-12-09 DIAGNOSIS — Z87898 Personal history of other specified conditions: Secondary | ICD-10-CM

## 2019-12-09 DIAGNOSIS — M19072 Primary osteoarthritis, left ankle and foot: Secondary | ICD-10-CM

## 2019-12-09 DIAGNOSIS — M3500 Sicca syndrome, unspecified: Secondary | ICD-10-CM

## 2019-12-09 DIAGNOSIS — Z8619 Personal history of other infectious and parasitic diseases: Secondary | ICD-10-CM

## 2019-12-09 NOTE — Patient Instructions (Signed)
Standing Labs We placed an order today for your standing lab work.    Please come back and get your standing labs in July and every 3 months  CBC and CMP   We have open lab daily Monday through Thursday from 8:30-12:30 PM and 1:30-4:30 PM and Friday from 8:30-12:30 PM and 1:30-4:00 PM at the office of Dr. Pollyann Savoy.   You may experience shorter wait times on Monday and Friday afternoons. The office is located at [address], [place], [place] [address] No appointment is necessary.   Labs are drawn by [facility].  You may receive a bill from [facility] for your lab work.  If you wish to have your labs drawn at another location, please call the office 24 hours in advance to send orders.  If you have any questions regarding directions or hours of operation,  please call [phone].   Just as a reminder please drink plenty of water prior to coming for your lab work. Thanks!

## 2019-12-10 MED ORDER — ACTEMRA 162 MG/0.9ML ~~LOC~~ SOSY: 162.0000 mg | PREFILLED_SYRINGE | SUBCUTANEOUS | 0 refills | Status: DC

## 2019-12-10 NOTE — Progress Notes (Signed)
CBC and CMP WNL. ESR WNL.

## 2019-12-10 NOTE — Telephone Encounter (Signed)
Last Visit: 12/09/19 Next Visit: 03/09/20 Labs: 12/09/19 WNL TB Gold: 02/10/19 Neg   Current Dose per office note on 12/09/19: Actemra 162 mg every 14 days   Okay to refill per Dr. Corliss Skains

## 2019-12-11 LAB — COMPLETE METABOLIC PANEL WITH GFR
AG Ratio: 2.1 (calc) (ref 1.0–2.5)
ALT: 29 U/L (ref 6–29)
AST: 22 U/L (ref 10–35)
Albumin: 4.2 g/dL (ref 3.6–5.1)
Alkaline phosphatase (APISO): 77 U/L (ref 37–153)
BUN: 11 mg/dL (ref 7–25)
CO2: 30 mmol/L (ref 20–32)
Calcium: 9.4 mg/dL (ref 8.6–10.4)
Chloride: 104 mmol/L (ref 98–110)
Creat: 0.82 mg/dL (ref 0.50–1.05)
GFR, Est African American: 91 mL/min/{1.73_m2} (ref >=60)
GFR, Est Non African American: 78 mL/min/{1.73_m2} (ref >=60)
Globulin: 2 g/dL (calc) (ref 1.9–3.7)
Glucose, Bld: 93 mg/dL (ref 65–99)
Potassium: 3.7 mmol/L (ref 3.5–5.3)
Sodium: 143 mmol/L (ref 135–146)
Total Bilirubin: 0.4 mg/dL (ref 0.2–1.2)
Total Protein: 6.2 g/dL (ref 6.1–8.1)

## 2019-12-11 LAB — CBC WITH DIFFERENTIAL/PLATELET
Absolute Monocytes: 587 cells/uL (ref 200–950)
Basophils Absolute: 94 cells/uL (ref 0–200)
Basophils Relative: 1.1 %
Eosinophils Absolute: 162 cells/uL (ref 15–500)
Eosinophils Relative: 1.9 %
HCT: 45.8 % — ABNORMAL HIGH (ref 35.0–45.0)
Hemoglobin: 15.3 g/dL (ref 11.7–15.5)
Lymphs Abs: 1556 cells/uL (ref 850–3900)
MCH: 30.9 pg (ref 27.0–33.0)
MCHC: 33.4 g/dL (ref 32.0–36.0)
MCV: 92.5 fL (ref 80.0–100.0)
MPV: 11.2 fL (ref 7.5–12.5)
Monocytes Relative: 6.9 %
Neutro Abs: 6103 cells/uL (ref 1500–7800)
Neutrophils Relative %: 71.8 %
Platelets: 201 10*3/uL (ref 140–400)
RBC: 4.95 10*6/uL (ref 3.80–5.10)
RDW: 12.3 % (ref 11.0–15.0)
Total Lymphocyte: 18.3 %
WBC: 8.5 10*3/uL (ref 3.8–10.8)

## 2019-12-11 LAB — QUANTIFERON-TB GOLD PLUS
Mitogen-NIL: 8.65 IU/mL
NIL: 0.02 IU/mL
QuantiFERON-TB Gold Plus: NEGATIVE
TB1-NIL: 0 IU/mL
TB2-NIL: 0 IU/mL

## 2019-12-11 LAB — SEDIMENTATION RATE: Sed Rate: 6 mm/h (ref 0–30)

## 2019-12-14 NOTE — Progress Notes (Signed)
TB gold negative

## 2019-12-25 DIAGNOSIS — R5383 Other fatigue: Secondary | ICD-10-CM | POA: Diagnosis not present

## 2019-12-25 DIAGNOSIS — E782 Mixed hyperlipidemia: Secondary | ICD-10-CM | POA: Diagnosis not present

## 2019-12-25 DIAGNOSIS — I10 Essential (primary) hypertension: Secondary | ICD-10-CM | POA: Diagnosis not present

## 2019-12-25 DIAGNOSIS — R739 Hyperglycemia, unspecified: Secondary | ICD-10-CM | POA: Diagnosis not present

## 2019-12-27 ENCOUNTER — Other Ambulatory Visit: Payer: Self-pay | Admitting: Rheumatology

## 2019-12-28 NOTE — Telephone Encounter (Signed)
Last Visit: 12/09/2019 Next Visit: 03/09/2020 Labs: 12/09/2019 CBC and CMP WNL.  Okay to refill per Dr. Corliss Skains.

## 2019-12-29 DIAGNOSIS — Z0001 Encounter for general adult medical examination with abnormal findings: Secondary | ICD-10-CM | POA: Diagnosis not present

## 2019-12-29 DIAGNOSIS — R5383 Other fatigue: Secondary | ICD-10-CM | POA: Diagnosis not present

## 2019-12-29 DIAGNOSIS — E782 Mixed hyperlipidemia: Secondary | ICD-10-CM | POA: Diagnosis not present

## 2019-12-29 DIAGNOSIS — I8310 Varicose veins of unspecified lower extremity with inflammation: Secondary | ICD-10-CM | POA: Diagnosis not present

## 2019-12-29 DIAGNOSIS — I872 Venous insufficiency (chronic) (peripheral): Secondary | ICD-10-CM | POA: Diagnosis not present

## 2020-01-01 DIAGNOSIS — I8310 Varicose veins of unspecified lower extremity with inflammation: Secondary | ICD-10-CM | POA: Diagnosis not present

## 2020-01-01 DIAGNOSIS — R5383 Other fatigue: Secondary | ICD-10-CM | POA: Diagnosis not present

## 2020-01-27 ENCOUNTER — Ambulatory Visit: Payer: Medicare Other | Admitting: Physician Assistant

## 2020-01-29 ENCOUNTER — Telehealth: Payer: Self-pay | Admitting: Rheumatology

## 2020-01-29 MED ORDER — PREDNISONE 5 MG PO TABS: ORAL_TABLET | ORAL | 0 refills | Status: DC

## 2020-01-29 NOTE — Telephone Encounter (Signed)
Christina Jenkins okay to send in prednisone taper as it was sent in last time. Patient advised and prescription sent to the pharmacy.

## 2020-01-29 NOTE — Telephone Encounter (Signed)
Patient called stating she is having "a lot of joint pain throughout her body and thinks it could be caused by the humidity."  Patient is requesting a prescription of Prednisone to be sent to [facility] at [address].

## 2020-02-01 DIAGNOSIS — Z720 Tobacco use: Secondary | ICD-10-CM | POA: Diagnosis not present

## 2020-02-01 DIAGNOSIS — J449 Chronic obstructive pulmonary disease, unspecified: Secondary | ICD-10-CM | POA: Diagnosis not present

## 2020-02-01 DIAGNOSIS — I10 Essential (primary) hypertension: Secondary | ICD-10-CM | POA: Diagnosis not present

## 2020-02-01 DIAGNOSIS — E7849 Other hyperlipidemia: Secondary | ICD-10-CM | POA: Diagnosis not present

## 2020-02-13 DIAGNOSIS — H5213 Myopia, bilateral: Secondary | ICD-10-CM | POA: Diagnosis not present

## 2020-02-25 ENCOUNTER — Other Ambulatory Visit: Payer: Self-pay | Admitting: Rheumatology

## 2020-02-26 MED ORDER — ACTEMRA 162 MG/0.9ML ~~LOC~~ SOSY: 162.0000 mg | PREFILLED_SYRINGE | SUBCUTANEOUS | 0 refills | Status: DC

## 2020-02-26 NOTE — Telephone Encounter (Signed)
Last Visit: 12/09/2019 Next Visit: 03/09/2020 Labs: 12/09/2019 CBC and CMP WNL TB Gold: 12/09/2019 Neg   Current Dose per office note 12/09/2019: Actemra 162 mg every 14 days   Okay to refill per Dr. Corliss Skains

## 2020-03-01 NOTE — Progress Notes (Signed)
Office Visit Note  Patient: Christina Jenkins             Date of Birth: 1961/04/29           MRN: [id number]             PCP: Encarnacion Slates, PA-C Referring: Encarnacion Slates, PA-C Visit Date: 03/09/2020 Occupation: @GUAROCC @  Subjective:  Left shoulder joint pain   History of Present Illness: Christina Jenkins is a 59 y.o. female with history of seropositive rheumatoid arthritis and osteoarthritis.  She is on actemra 162 mg sq injections every 14 days and arava 10 mg 1 tablet by mouth daily.  She has not missed any doses of Actemra or Arava recently.  She is tolerating both medications without any side effects.  She has not had any recent infections.  Patient reports that she continues to have pain in the left shoulder and both hands.  She has noticed intermittent swelling in both hands.  She has not had the MRI of her shoulder yet due to her husband having to have a shoulder MRI recently.  She has been applying ice topically for pain relief.   Activities of Daily Living:  Patient reports morning stiffness for 30-60  minutes.   Patient Reports nocturnal pain.  Difficulty dressing/grooming: Denies Difficulty climbing stairs: Denies Difficulty getting out of chair: Reports Difficulty using hands for taps, buttons, cutlery, and/or writing: Reports  Review of Systems  Constitutional: Negative for fatigue.  HENT: Negative for mouth sores, mouth dryness and nose dryness.   Eyes: Negative for pain, visual disturbance and dryness.  Respiratory: Negative for cough, hemoptysis, shortness of breath and difficulty breathing.   Cardiovascular: Negative for chest pain, palpitations, hypertension and swelling in legs/feet.  Gastrointestinal: Negative for blood in stool, constipation and diarrhea.  Endocrine: Negative for increased urination.  Genitourinary: Negative for painful urination.  Musculoskeletal: Positive for arthralgias, joint pain, joint swelling and morning stiffness. Negative for myalgias,  muscle weakness, muscle tenderness and myalgias.  Skin: Negative for color change, pallor, rash, hair loss, nodules/bumps, skin tightness, ulcers and sensitivity to sunlight.  Allergic/Immunologic: Negative for susceptible to infections.  Neurological: Negative for dizziness, numbness, headaches and weakness.  Hematological: Negative for swollen glands.  Psychiatric/Behavioral: Negative for depressed mood and sleep disturbance. The patient is not nervous/anxious.     PMFS History:  Patient Active Problem List   Diagnosis Date Noted  . Chest pain of uncertain etiology 04/30/2019  . Abnormal nuclear stress test   . History of COPD 05/31/2017  . Fatigue 12/04/2016  . History of seizures 10/23/2016  . History of hepatitis C 10/23/2016  . ANA positive 10/23/2016  . Sicca syndrome, unspecified (HCC) 10/23/2016  . Seropositive rheumatoid arthritis (HCC) 08/01/2016  . High risk medication use 08/01/2016  . Hepatitis C 08/01/2016  . Tobacco abuse 08/01/2016  . Depression 08/01/2016  . Complex partial seizure (HCC) 02/02/2016  . MCI (mild cognitive impairment) 11/02/2015  . Arthralgia 11/02/2015  . Carpal tunnel syndrome 10/20/2015  . Syncope 05/17/2015  . Convulsions/seizures (HCC) 05/17/2015    Past Medical History:  Diagnosis Date  . Arthritis 02/01/16   dx w/rheumatoid arthritis  . Carpal tunnel syndrome 10/20/2015   right  . Carpal tunnel syndrome on both sides   . Dysuria   . Hypokalemia   . Liver disease    hepatitis c  . Seasonal allergies   . Seizures (HCC)    last 05/05/15    Family History  Problem  Relation Age of Onset  . Pancreatic cancer Mother   . COPD Father   . Diabetes Brother    Past Surgical History:  Procedure Laterality Date  . LEFT HEART CATH AND CORONARY ANGIOGRAPHY N/A 04/30/2019   Procedure: LEFT HEART CATH AND CORONARY ANGIOGRAPHY;  Surgeon: Runell Gess, MD;  Location: [facility];  Service: Cardiovascular;  Laterality: N/A;  .  TONSILLECTOMY     Social History   Social History Narrative  . Not on file   Immunization History  Administered Date(s) Administered  . Influenza,inj,Quad PF,6+ Mos 05/16/2017, 05/15/2018  . Tdap 06/26/2018  . Zoster Recombinat (Shingrix) 09/04/2017     Objective: Vital Signs: BP (!) 143/82 (BP Location: Right Arm, Patient Position: Sitting, Cuff Size: Large)   Pulse 81   Resp 13   Ht 5\' 6"  (1.676 m)   Wt 230 lb 6.4 oz (104.5 kg)   BMI 37.19 kg/m    Physical Exam Vitals and nursing note reviewed.  Constitutional:      Appearance: She is well-developed.  HENT:     Head: Normocephalic and atraumatic.  Eyes:     Conjunctiva/sclera: Conjunctivae normal.  Pulmonary:     Effort: Pulmonary effort is normal.  Abdominal:     General: Bowel sounds are normal.     Palpations: Abdomen is soft.  Musculoskeletal:     Cervical back: Normal range of motion.  Lymphadenopathy:     Cervical: No cervical adenopathy.  Skin:    General: Skin is warm and dry.     Capillary Refill: Capillary refill takes less than 2 seconds.  Neurological:     Mental Status: She is alert and oriented to person, place, and time.  Psychiatric:        Behavior: Behavior normal.      Musculoskeletal Exam:  C-spine, thoracic spine, and lumbar spine good ROM.  Right shoulder full ROM.  Left shoulder has limited abduction to 120 degrees.  Elbow joints, wrist joints, MCPs, PIPs, and DIPs good ROM with no synovitis.  Complete fist formation bilaterally. PIP and DIP thickening consistent with osteoarthritis noted.  Tenderness over several MCP and PIP joints described below.  Mild synovitis of the right 2nd MCP joint noted. Hip joints, knee joints, ankle joints good ROM with no synovitis.  No warmth or effusion of knee joints.  No tenderness or swelling of ankle joints.    CDAI Exam: CDAI Score: 11  Patient Global: 5 mm; Provider Global: 5 mm Swollen: 1 ; Tender: 9  Joint Exam 03/09/2020      Right  Left    Glenohumeral      Tender  MCP 1   Tender   Tender  MCP 2  Swollen Tender   Tender  MCP 3   Tender     MCP 5   Tender     PIP 2   Tender     PIP 3   Tender        Investigation: No additional findings.  Imaging: No results found.  Recent Labs: Lab Results  Component Value Date   WBC 8.5 12/09/2019   HGB 15.3 12/09/2019   PLT 201 12/09/2019   NA 143 12/09/2019   K 3.7 12/09/2019   CL 104 12/09/2019   CO2 30 12/09/2019   GLUCOSE 93 12/09/2019   BUN 11 12/09/2019   CREATININE 0.82 12/09/2019   BILITOT 0.4 12/09/2019   ALKPHOS 69 04/29/2017   AST 22 12/09/2019   ALT 29 12/09/2019  PROT 6.2 12/09/2019   ALBUMIN 4.2 04/29/2017   CALCIUM 9.4 12/09/2019   GFRAA 91 12/09/2019   QFTBGOLDPLUS NEGATIVE 12/09/2019    Speciality Comments: Need Hep C quant every 6 months Prior therapy: methotrexate (oral ulcers) and Cimzia (inadequate response)   Procedures:  Large Joint Inj: L glenohumeral on 03/09/2020 1:42 PM Indications: pain Details: 27 G 1.5 in needle, posterior approach  Arthrogram: No  Medications: 40 mg triamcinolone acetonide 40 MG/ML; 1 mL lidocaine 1 % Aspirate: 0 mL Outcome: tolerated well, no immediate complications Procedure, treatment alternatives, risks and benefits explained, specific risks discussed. Consent was given by the patient. Immediately prior to procedure a time out was called to verify the correct patient, procedure, equipment, support staff and site/side marked as required. Patient was prepped and draped in the usual sterile fashion.     Allergies: Patient has no known allergies.   Assessment / Plan:     Visit Diagnoses: Rheumatoid arthritis with rheumatoid factor of multiple sites without organ or systems involvement [facility]): She has been experiencing increased discomfort in the left shoulder and both hands.  She has not had the left shoulder MRI which was previously ordered.  She has tenderness of several MCP joints as described above.  She  has been experiencing intermittent swelling in both hands.  She requested a left shoulder joint cortisone injection today.  She tolerated the procedure well.  She also requested a prednisone taper to help with the pain and inflammation in her hands.  A prednisone taper starting at 20 mg tapering by 5 mg every 2 days will be sent to the pharmacy.  We discussed continuing on Actemra 162 mg subcutaneous injections every 14 days.  She is currently taking Arava 10 mg 1 tablet by mouth daily.  We discussed that once lab work has been updated we will discuss increasing the dose of Arava to 20 mg 1 tablet by mouth daily.  Her LFTs were within normal limits on 12/09/2019.  She was encouraged to avoid NSAIDs and Tylenol use.  All questions were addressed.  She will require updated lab work 2 weeks after potentially increasing the dose of Arava. She was advised to notify us if she develops increased joint pain or joint swelling.  She will follow-up in the office in 5 months.  High risk medication use - Actemra 162 mg every 14 days and Arava 10 mg daily (reduced dose of elevated LFTs).  CBC and CMP were drawn on 12/09/2019.  Lab work was reviewed with the patient today in the office.  TB gold was negative on 12/09/2019.  She will be going for lab work in 1 week week at her PCPs office.  We discussed having CBC and CMP drawn at that time.  She has not had any recent infections.  We discussed the importance of holding Actemra and Arava if she develops signs or symptoms of an infection and to resume once the infection has completely cleared.  Chronic left shoulder pain - She presents today with ongoing discomfort in the left shoulder.  She has abduction to about 120 degrees with discomfort.  She also has discomfort with internal rotation.  Her discomfort is exacerbated when showering.  She has ongoing nocturnal pain.  She had a left shoulder joint cortisone injection performed on 07/13/2019 and a repeat injection on 10/14/2019.  She  requested a repeat cortisone injection today.  She tolerated procedure well.  Aftercare was discussed.  She was encouraged to continue to perform shoulder exercises.  The MRI for the left shoulder is in place and she plans on having this performed soon.  Primary osteoarthritis of both hands: She has PIP and DIP thickening consistent with osteoarthritis of both hands.  She has been experiencing increased pain and intermittent inflammation in both hands.  The importance of joint protection and muscle strengthening were discussed.  Primary osteoarthritis of both feet: She experiences occasional discomfort in both feet.  She has good range of motion of both ankle joints with no tenderness on exam.  We discussed the importance of wearing proper fitting shoes.  Sicca syndrome, unspecified (HCC): She is not experiencing any sicca symptoms at this time.  ANA positive: She has no other clinical features of autoimmune disease at this time.  Trochanteric bursitis of both hips: Resolved.  She has no tenderness to palpation on exam.  She has good range of motion of both hip joints with no discomfort.  Other medical conditions are listed as follows:  History of COPD  History of seizures  History of hepatitis C  Tobacco abuse  Orders: Orders Placed This Encounter  Procedures  . Large Joint Inj: L glenohumeral   Meds ordered this encounter  Medications  . predniSONE (DELTASONE) 5 MG tablet    Sig: Take 4 tabs po qd x 2 days, 3  tabs po qd x 2 days, 2  tabs po qd x 2 days, 1  tab po qd x 2 days    Dispense:  20 tablet    Refill:  0    Face-to-face time spent with patient was 30 minutes. Greater than 50% of time was spent in counseling and coordination of care.  Follow-Up Instructions: Return in about 5 months (around 08/09/2020) for Rheumatoid arthritis.   Gearldine Bienenstock, PA-C  Note - This record has been created using Dragon software.  Chart creation errors have been sought, but may not  always  have been located. Such creation errors do not reflect on  the standard of medical care.

## 2020-03-08 DIAGNOSIS — Z1231 Encounter for screening mammogram for malignant neoplasm of breast: Secondary | ICD-10-CM | POA: Diagnosis not present

## 2020-03-09 ENCOUNTER — Encounter: Payer: Self-pay | Admitting: Physician Assistant

## 2020-03-09 ENCOUNTER — Other Ambulatory Visit: Payer: Self-pay

## 2020-03-09 ENCOUNTER — Ambulatory Visit (INDEPENDENT_AMBULATORY_CARE_PROVIDER_SITE_OTHER): Payer: Medicare Other | Admitting: Physician Assistant

## 2020-03-09 VITALS — BP 143/82 | HR 81 | Resp 13 | Ht 66.0 in | Wt 230.4 lb

## 2020-03-09 DIAGNOSIS — M19071 Primary osteoarthritis, right ankle and foot: Secondary | ICD-10-CM

## 2020-03-09 DIAGNOSIS — Z87898 Personal history of other specified conditions: Secondary | ICD-10-CM

## 2020-03-09 DIAGNOSIS — Z8619 Personal history of other infectious and parasitic diseases: Secondary | ICD-10-CM

## 2020-03-09 DIAGNOSIS — Z79899 Other long term (current) drug therapy: Secondary | ICD-10-CM

## 2020-03-09 DIAGNOSIS — M7061 Trochanteric bursitis, right hip: Secondary | ICD-10-CM

## 2020-03-09 DIAGNOSIS — G8929 Other chronic pain: Secondary | ICD-10-CM

## 2020-03-09 DIAGNOSIS — M3500 Sicca syndrome, unspecified: Secondary | ICD-10-CM

## 2020-03-09 DIAGNOSIS — M19041 Primary osteoarthritis, right hand: Secondary | ICD-10-CM | POA: Diagnosis not present

## 2020-03-09 DIAGNOSIS — R768 Other specified abnormal immunological findings in serum: Secondary | ICD-10-CM

## 2020-03-09 DIAGNOSIS — M25512 Pain in left shoulder: Secondary | ICD-10-CM

## 2020-03-09 DIAGNOSIS — M0579 Rheumatoid arthritis with rheumatoid factor of multiple sites without organ or systems involvement: Secondary | ICD-10-CM

## 2020-03-09 DIAGNOSIS — Z8709 Personal history of other diseases of the respiratory system: Secondary | ICD-10-CM

## 2020-03-09 DIAGNOSIS — M19042 Primary osteoarthritis, left hand: Secondary | ICD-10-CM

## 2020-03-09 DIAGNOSIS — M7062 Trochanteric bursitis, left hip: Secondary | ICD-10-CM

## 2020-03-09 DIAGNOSIS — Z720 Tobacco use: Secondary | ICD-10-CM

## 2020-03-09 DIAGNOSIS — M19072 Primary osteoarthritis, left ankle and foot: Secondary | ICD-10-CM

## 2020-03-09 MED ORDER — TRIAMCINOLONE ACETONIDE 40 MG/ML IJ SUSP
40.0000 mg | INTRAMUSCULAR | Status: AC | PRN
  Administered 2020-03-09: 40 mg via INTRA_ARTICULAR

## 2020-03-09 MED ORDER — PREDNISONE 5 MG PO TABS: ORAL_TABLET | ORAL | 0 refills | Status: DC

## 2020-03-09 MED ORDER — LIDOCAINE HCL 1 % IJ SOLN
1.0000 mL | INTRAMUSCULAR | Status: AC | PRN
  Administered 2020-03-09: 1 mL

## 2020-03-09 NOTE — Patient Instructions (Signed)
Standing Labs We placed an order today for your standing lab work.   Please have your standing labs drawn in July   CBC and CMP   If possible, please have your labs drawn 2 weeks prior to your appointment so that the provider can discuss your results at your appointment.  We have open lab daily Monday through Thursday from 8:30-12:30 PM and 1:30-4:30 PM and Friday from 8:30-12:30 PM and 1:30-4:00 PM at the office of Dr. Pollyann Savoy, [facility].   Please be advised, patients with office appointments requiring lab work will take precedents over walk-in lab work.  If possible, please come for your lab work on Monday and Friday afternoons, as you may experience shorter wait times. The office is located at [address], [address], [place], [place] [address] No appointment is necessary.   Labs are drawn by [facility]. Please bring your co-pay at the time of your lab draw.  You may receive a bill from [facility] for your lab work.  If you wish to have your labs drawn at another location, please call the office 24 hours in advance to send orders.  If you have any questions regarding directions or hours of operation,  please call [phone].   As a reminder, please drink plenty of water prior to coming for your lab work. Thanks!

## 2020-03-16 DIAGNOSIS — R928 Other abnormal and inconclusive findings on diagnostic imaging of breast: Secondary | ICD-10-CM | POA: Diagnosis not present

## 2020-03-16 DIAGNOSIS — N6321 Unspecified lump in the left breast, upper outer quadrant: Secondary | ICD-10-CM | POA: Diagnosis not present

## 2020-03-16 DIAGNOSIS — N6489 Other specified disorders of breast: Secondary | ICD-10-CM | POA: Diagnosis not present

## 2020-03-18 DIAGNOSIS — J449 Chronic obstructive pulmonary disease, unspecified: Secondary | ICD-10-CM | POA: Diagnosis not present

## 2020-03-18 DIAGNOSIS — I10 Essential (primary) hypertension: Secondary | ICD-10-CM | POA: Diagnosis not present

## 2020-03-18 DIAGNOSIS — R739 Hyperglycemia, unspecified: Secondary | ICD-10-CM | POA: Diagnosis not present

## 2020-03-18 DIAGNOSIS — E782 Mixed hyperlipidemia: Secondary | ICD-10-CM | POA: Diagnosis not present

## 2020-03-22 DIAGNOSIS — R5383 Other fatigue: Secondary | ICD-10-CM | POA: Diagnosis not present

## 2020-03-22 DIAGNOSIS — I872 Venous insufficiency (chronic) (peripheral): Secondary | ICD-10-CM | POA: Diagnosis not present

## 2020-03-22 DIAGNOSIS — E782 Mixed hyperlipidemia: Secondary | ICD-10-CM | POA: Diagnosis not present

## 2020-03-22 DIAGNOSIS — I8310 Varicose veins of unspecified lower extremity with inflammation: Secondary | ICD-10-CM | POA: Diagnosis not present

## 2020-03-25 ENCOUNTER — Telehealth: Payer: Self-pay | Admitting: *Deleted

## 2020-03-25 NOTE — Telephone Encounter (Signed)
Labs received from Roxine Caddy, PA-C Drawn on 03/18/2020 Reviewed by Sherron Ales, PA-C   CBC/CMP/HgbA1C  Glucose 122 HgbA1C 6.1  All other labs WNL.

## 2020-03-29 ENCOUNTER — Other Ambulatory Visit: Payer: Self-pay | Admitting: Rheumatology

## 2020-03-29 NOTE — Telephone Encounter (Signed)
Last Visit: 03/09/2020 Next Visit: 08/11/2020 Labs: 03/18/2020 Glucose 122 HgbA1C 6.1  All other labs WNL.  Current Dose per office note on 03/09/2020: Arava 10 mg daily   Okay to refill per Dr. Corliss Skains

## 2020-04-21 DIAGNOSIS — Z7689 Persons encountering health services in other specified circumstances: Secondary | ICD-10-CM | POA: Diagnosis not present

## 2020-04-21 DIAGNOSIS — M059 Rheumatoid arthritis with rheumatoid factor, unspecified: Secondary | ICD-10-CM | POA: Diagnosis not present

## 2020-04-21 DIAGNOSIS — M130 Polyarthritis, unspecified: Secondary | ICD-10-CM | POA: Diagnosis not present

## 2020-04-28 ENCOUNTER — Other Ambulatory Visit: Payer: Self-pay

## 2020-04-28 NOTE — Telephone Encounter (Signed)
She can try increasing the dose of Arava to 20 mg daily.  Recheck LFTs 2 weeks after increasing the dose.  If she continues to have recurrent flares we will have to discuss other treatment options.

## 2020-04-28 NOTE — Telephone Encounter (Signed)
attempted to contact the patient and left message for patient to call the office.  

## 2020-05-03 DIAGNOSIS — J449 Chronic obstructive pulmonary disease, unspecified: Secondary | ICD-10-CM | POA: Diagnosis not present

## 2020-05-03 DIAGNOSIS — Z720 Tobacco use: Secondary | ICD-10-CM | POA: Diagnosis not present

## 2020-05-03 DIAGNOSIS — E7849 Other hyperlipidemia: Secondary | ICD-10-CM | POA: Diagnosis not present

## 2020-05-03 DIAGNOSIS — I10 Essential (primary) hypertension: Secondary | ICD-10-CM | POA: Diagnosis not present

## 2020-05-12 ENCOUNTER — Other Ambulatory Visit: Payer: Self-pay | Admitting: Rheumatology

## 2020-05-13 MED ORDER — ACTEMRA 162 MG/0.9ML ~~LOC~~ SOSY: 162.0000 mg | PREFILLED_SYRINGE | SUBCUTANEOUS | 0 refills | Status: DC

## 2020-05-13 NOTE — Telephone Encounter (Signed)
Last Visit: 03/09/2020 Next Visit: 08/11/2020 Labs: 03/18/2020 Glucose 122 HgbA1C 6.1  All other labs WNL. TB Gold: 12/09/2019 Neg   Current Dose per office note on 03/09/2020: Actemra 162 mg every 14 days   Okay to refill per Dr. Corliss Skains

## 2020-05-16 ENCOUNTER — Telehealth: Payer: Self-pay | Admitting: Rheumatology

## 2020-05-16 MED ORDER — PREDNISONE 5 MG PO TABS
ORAL_TABLET | ORAL | 0 refills | Status: DC
Start: 2020-05-16 — End: 2020-07-01

## 2020-05-16 NOTE — Telephone Encounter (Signed)
Patient called stating she is having pain in her bilateral hands and wrists.  Patient is requesting a prescription of Prednisone to be sent to [facility] at [address] in [place].

## 2020-05-16 NOTE — Telephone Encounter (Signed)
Please schedule a sooner office visit to discuss different treatment options.  She continues to have recurrent flares requiring frequent prednisone tapers.  I will send in a prednisone taper starting at 20 mg tapering by 5 mg every 2 days until she is able to make a visit for further evaluation and to discuss treatment options.

## 2020-05-17 NOTE — Progress Notes (Deleted)
Office Visit Note  Patient: Christina Jenkins             Date of Birth: 1961-03-15           MRN: [id number]             PCP: Encarnacion Slates, PA-C Referring: Encarnacion Slates, PA-C Visit Date: 05/25/2020 Occupation: @GUAROCC @  Subjective:  No chief complaint on file.   History of Present Illness: Christina Jenkins is a 59 y.o. female ***   Activities of Daily Living:  Patient reports morning stiffness for *** {minute/hour:19697}.   Patient {ACTIONS;DENIES/REPORTS:21021675::"Denies"} nocturnal pain.  Difficulty dressing/grooming: {ACTIONS;DENIES/REPORTS:21021675::"Denies"} Difficulty climbing stairs: {ACTIONS;DENIES/REPORTS:21021675::"Denies"} Difficulty getting out of chair: {ACTIONS;DENIES/REPORTS:21021675::"Denies"} Difficulty using hands for taps, buttons, cutlery, and/or writing: {ACTIONS;DENIES/REPORTS:21021675::"Denies"}  No Rheumatology ROS completed.   PMFS History:  Patient Active Problem List   Diagnosis Date Noted  . Chest pain of uncertain etiology 04/30/2019  . Abnormal nuclear stress test   . History of COPD 05/31/2017  . Fatigue 12/04/2016  . History of seizures 10/23/2016  . History of hepatitis C 10/23/2016  . ANA positive 10/23/2016  . Sicca syndrome, unspecified (HCC) 10/23/2016  . Seropositive rheumatoid arthritis (HCC) 08/01/2016  . High risk medication use 08/01/2016  . Hepatitis C 08/01/2016  . Tobacco abuse 08/01/2016  . Depression 08/01/2016  . Complex partial seizure (HCC) 02/02/2016  . MCI (mild cognitive impairment) 11/02/2015  . Arthralgia 11/02/2015  . Carpal tunnel syndrome 10/20/2015  . Syncope 05/17/2015  . Convulsions/seizures (HCC) 05/17/2015    Past Medical History:  Diagnosis Date  . Arthritis 02/01/16   dx w/rheumatoid arthritis  . Carpal tunnel syndrome 10/20/2015   right  . Carpal tunnel syndrome on both sides   . Dysuria   . Hypokalemia   . Liver disease    hepatitis c  . Seasonal allergies   . Seizures (HCC)    last 05/05/15      Family History  Problem Relation Age of Onset  . Pancreatic cancer Mother   . COPD Father   . Diabetes Brother    Past Surgical History:  Procedure Laterality Date  . LEFT HEART CATH AND CORONARY ANGIOGRAPHY N/A 04/30/2019   Procedure: LEFT HEART CATH AND CORONARY ANGIOGRAPHY;  Surgeon: 05/02/2019, MD;  Location: [facility];  Service: Cardiovascular;  Laterality: N/A;  . TONSILLECTOMY     Social History   Social History Narrative  . Not on file   Immunization History  Administered Date(s) Administered  . Influenza,inj,Quad PF,6+ Mos 05/16/2017, 05/15/2018  . Tdap 06/26/2018  . Zoster Recombinat (Shingrix) 09/04/2017     Objective: Vital Signs: There were no vitals taken for this visit.   Physical Exam   Musculoskeletal Exam: ***  CDAI Exam: CDAI Score: -- Patient Global: --; Provider Global: -- Swollen: --; Tender: -- Joint Exam 05/25/2020   No joint exam has been documented for this visit   There is currently no information documented on the homunculus. Go to the Rheumatology activity and complete the homunculus joint exam.  Investigation: No additional findings.  Imaging: No results found.  Recent Labs: Lab Results  Component Value Date   WBC 8.5 12/09/2019   HGB 15.3 12/09/2019   PLT 201 12/09/2019   NA 143 12/09/2019   K 3.7 12/09/2019   CL 104 12/09/2019   CO2 30 12/09/2019   GLUCOSE 93 12/09/2019   BUN 11 12/09/2019   CREATININE 0.82 12/09/2019   BILITOT 0.4 12/09/2019   ALKPHOS  69 04/29/2017   AST 22 12/09/2019   ALT 29 12/09/2019   PROT 6.2 12/09/2019   ALBUMIN 4.2 04/29/2017   CALCIUM 9.4 12/09/2019   GFRAA 91 12/09/2019   QFTBGOLDPLUS NEGATIVE 12/09/2019    Speciality Comments: Need Hep C quant every 6 months Prior therapy: methotrexate (oral ulcers) and Cimzia (inadequate response)   Procedures:  No procedures performed Allergies: Patient has no known allergies.   Assessment / Plan:     Visit Diagnoses: No  diagnosis found.  Orders: No orders of the defined types were placed in this encounter.  No orders of the defined types were placed in this encounter.   Face-to-face time spent with patient was *** minutes. Greater than 50% of time was spent in counseling and coordination of care.  Follow-Up Instructions: No follow-ups on file.   Gearldine Bienenstock, PA-C  Note - This record has been created using Dragon software.  Chart creation errors have been sought, but may not always  have been located. Such creation errors do not reflect on  the standard of medical care.

## 2020-05-17 NOTE — Telephone Encounter (Signed)
Patient advised prescription for prednisone has been sent to the pharmacy and she has been schedule to discuss treatment options on 05/25/2020 at 9:00 am.

## 2020-05-23 NOTE — Progress Notes (Deleted)
Office Visit Note  Patient: Christina Jenkins             Date of Birth: 1961-07-29           MRN: [id number]             PCP: Encarnacion Slates, PA-C Referring: Encarnacion Slates, PA-C Visit Date: 06/02/2020 Occupation: @GUAROCC @  Subjective:  No chief complaint on file.   History of Present Illness: Christina Jenkins is a 59 y.o. female ***   Activities of Daily Living:  Patient reports morning stiffness for *** {minute/hour:19697}.   Patient {ACTIONS;DENIES/REPORTS:21021675::"Denies"} nocturnal pain.  Difficulty dressing/grooming: {ACTIONS;DENIES/REPORTS:21021675::"Denies"} Difficulty climbing stairs: {ACTIONS;DENIES/REPORTS:21021675::"Denies"} Difficulty getting out of chair: {ACTIONS;DENIES/REPORTS:21021675::"Denies"} Difficulty using hands for taps, buttons, cutlery, and/or writing: {ACTIONS;DENIES/REPORTS:21021675::"Denies"}  No Rheumatology ROS completed.   PMFS History:  Patient Active Problem List   Diagnosis Date Noted  . Chest pain of uncertain etiology 04/30/2019  . Abnormal nuclear stress test   . History of COPD 05/31/2017  . Fatigue 12/04/2016  . History of seizures 10/23/2016  . History of hepatitis C 10/23/2016  . ANA positive 10/23/2016  . Sicca syndrome, unspecified (HCC) 10/23/2016  . Seropositive rheumatoid arthritis (HCC) 08/01/2016  . High risk medication use 08/01/2016  . Hepatitis C 08/01/2016  . Tobacco abuse 08/01/2016  . Depression 08/01/2016  . Complex partial seizure (HCC) 02/02/2016  . MCI (mild cognitive impairment) 11/02/2015  . Arthralgia 11/02/2015  . Carpal tunnel syndrome 10/20/2015  . Syncope 05/17/2015  . Convulsions/seizures (HCC) 05/17/2015    Past Medical History:  Diagnosis Date  . Arthritis 02/01/16   dx w/rheumatoid arthritis  . Carpal tunnel syndrome 10/20/2015   right  . Carpal tunnel syndrome on both sides   . Dysuria   . Hypokalemia   . Liver disease    hepatitis c  . Seasonal allergies   . Seizures (HCC)    last 05/05/15      Family History  Problem Relation Age of Onset  . Pancreatic cancer Mother   . COPD Father   . Diabetes Brother    Past Surgical History:  Procedure Laterality Date  . LEFT HEART CATH AND CORONARY ANGIOGRAPHY N/A 04/30/2019   Procedure: LEFT HEART CATH AND CORONARY ANGIOGRAPHY;  Surgeon: 05/02/2019, MD;  Location: [facility];  Service: Cardiovascular;  Laterality: N/A;  . TONSILLECTOMY     Social History   Social History Narrative  . Not on file   Immunization History  Administered Date(s) Administered  . Influenza,inj,Quad PF,6+ Mos 05/16/2017, 05/15/2018  . Tdap 06/26/2018  . Zoster Recombinat (Shingrix) 09/04/2017     Objective: Vital Signs: There were no vitals taken for this visit.   Physical Exam   Musculoskeletal Exam: ***  CDAI Exam: CDAI Score: -- Patient Global: --; Provider Global: -- Swollen: --; Tender: -- Joint Exam 06/02/2020   No joint exam has been documented for this visit   There is currently no information documented on the homunculus. Go to the Rheumatology activity and complete the homunculus joint exam.  Investigation: No additional findings.  Imaging: No results found.  Recent Labs: Lab Results  Component Value Date   WBC 8.5 12/09/2019   HGB 15.3 12/09/2019   PLT 201 12/09/2019   NA 143 12/09/2019   K 3.7 12/09/2019   CL 104 12/09/2019   CO2 30 12/09/2019   GLUCOSE 93 12/09/2019   BUN 11 12/09/2019   CREATININE 0.82 12/09/2019   BILITOT 0.4 12/09/2019   ALKPHOS  69 04/29/2017   AST 22 12/09/2019   ALT 29 12/09/2019   PROT 6.2 12/09/2019   ALBUMIN 4.2 04/29/2017   CALCIUM 9.4 12/09/2019   GFRAA 91 12/09/2019   QFTBGOLDPLUS NEGATIVE 12/09/2019    Speciality Comments: Need Hep C quant every 6 months Prior therapy: methotrexate (oral ulcers) and Cimzia (inadequate response)   Procedures:  No procedures performed Allergies: Patient has no known allergies.   Assessment / Plan:     Visit Diagnoses: No  diagnosis found.  Orders: No orders of the defined types were placed in this encounter.  No orders of the defined types were placed in this encounter.   Face-to-face time spent with patient was *** minutes. Greater than 50% of time was spent in counseling and coordination of care.  Follow-Up Instructions: No follow-ups on file.   Ellen Henri, CMA  Note - This record has been created using Animal nutritionist.  Chart creation errors have been sought, but may not always  have been located. Such creation errors do not reflect on  the standard of medical care.

## 2020-05-25 ENCOUNTER — Ambulatory Visit: Payer: Medicare Other | Admitting: Physician Assistant

## 2020-05-29 ENCOUNTER — Other Ambulatory Visit: Payer: Self-pay | Admitting: Rheumatology

## 2020-05-30 ENCOUNTER — Other Ambulatory Visit: Payer: Self-pay | Admitting: Rheumatology

## 2020-05-30 NOTE — Telephone Encounter (Signed)
Last Visit: 03/09/2020 Next Visit: 06/02/2020 Labs: 03/18/2020  Glucose 122 HgbA1C 6.1  All other labs WNL.  Current Dose per office note 03/09/2020: Arava 10 mg 1 tablet by mouth daily.  We discussed that once lab work has been updated we will discuss increasing the dose of Arava to 20 mg 1 tablet by mouth daily. DX: Rheumatoid arthritis with rheumatoid factor of multiple sites without organ or systems involvement  Okay to refill Arava? And at which dose?

## 2020-05-30 NOTE — Telephone Encounter (Signed)
We will update lab work at follow up visit on 06/02/20 in order for [place] to determine the appropriate dose.

## 2020-05-31 MED ORDER — LEFLUNOMIDE 10 MG PO TABS
20.0000 mg | ORAL_TABLET | Freq: Every day | ORAL | 0 refills | Status: DC
Start: 2020-05-31 — End: 2020-07-01

## 2020-05-31 NOTE — Addendum Note (Signed)
Addended by: Gearldine Bienenstock on: 05/31/2020 10:55 AM   Modules accepted: Orders

## 2020-05-31 NOTE — Telephone Encounter (Signed)
Attempted to contact patient, left voicemail to advise we will update lab work at follow up visit on 06/02/20 to determine the appropriate dose.

## 2020-05-31 NOTE — Addendum Note (Signed)
Addended by: Henriette Combs on: 05/31/2020 09:13 AM   Modules accepted: Orders

## 2020-05-31 NOTE — Telephone Encounter (Signed)
Ok to refill 30 day supply of Arava 20 mg daily.

## 2020-05-31 NOTE — Telephone Encounter (Signed)
Next Visit: 06/02/2020 Labs: 03/18/2020  Glucose 122 HgbA1C 6.1  All other labs WNL.  Patient states we increased the Arava to 20 mg. Patient states she has run out of medication.   Okay to refill Arava?

## 2020-06-02 ENCOUNTER — Ambulatory Visit: Payer: Medicare Other | Admitting: Physician Assistant

## 2020-06-02 DIAGNOSIS — E7849 Other hyperlipidemia: Secondary | ICD-10-CM | POA: Diagnosis not present

## 2020-06-02 DIAGNOSIS — I10 Essential (primary) hypertension: Secondary | ICD-10-CM | POA: Diagnosis not present

## 2020-06-02 DIAGNOSIS — Z720 Tobacco use: Secondary | ICD-10-CM | POA: Diagnosis not present

## 2020-06-02 DIAGNOSIS — J449 Chronic obstructive pulmonary disease, unspecified: Secondary | ICD-10-CM | POA: Diagnosis not present

## 2020-06-06 DIAGNOSIS — M79642 Pain in left hand: Secondary | ICD-10-CM | POA: Diagnosis not present

## 2020-06-06 DIAGNOSIS — M79641 Pain in right hand: Secondary | ICD-10-CM | POA: Diagnosis not present

## 2020-06-06 DIAGNOSIS — M069 Rheumatoid arthritis, unspecified: Secondary | ICD-10-CM | POA: Diagnosis not present

## 2020-06-06 NOTE — Progress Notes (Deleted)
Office Visit Note  Patient: Christina Jenkins             Date of Birth: May 02, 1961           MRN: [id number]             PCP: Encarnacion Slates, PA-C Referring: Encarnacion Slates, PA-C Visit Date: 06/17/2020 Occupation: @GUAROCC @  Subjective:  No chief complaint on file.   History of Present Illness: Christina Jenkins is a 59 y.o. female ***   Activities of Daily Living:  Patient reports morning stiffness for *** {minute/hour:19697}.   Patient {ACTIONS;DENIES/REPORTS:21021675::"Denies"} nocturnal pain.  Difficulty dressing/grooming: {ACTIONS;DENIES/REPORTS:21021675::"Denies"} Difficulty climbing stairs: {ACTIONS;DENIES/REPORTS:21021675::"Denies"} Difficulty getting out of chair: {ACTIONS;DENIES/REPORTS:21021675::"Denies"} Difficulty using hands for taps, buttons, cutlery, and/or writing: {ACTIONS;DENIES/REPORTS:21021675::"Denies"}  No Rheumatology ROS completed.   PMFS History:  Patient Active Problem List   Diagnosis Date Noted  . Chest pain of uncertain etiology 04/30/2019  . Abnormal nuclear stress test   . History of COPD 05/31/2017  . Fatigue 12/04/2016  . History of seizures 10/23/2016  . History of hepatitis C 10/23/2016  . ANA positive 10/23/2016  . Sicca syndrome, unspecified 10/23/2016  . Seropositive rheumatoid arthritis (HCC) 08/01/2016  . High risk medication use 08/01/2016  . Hepatitis C 08/01/2016  . Tobacco abuse 08/01/2016  . Depression 08/01/2016  . Complex partial seizure (HCC) 02/02/2016  . MCI (mild cognitive impairment) 11/02/2015  . Arthralgia 11/02/2015  . Carpal tunnel syndrome 10/20/2015  . Syncope 05/17/2015  . Convulsions/seizures (HCC) 05/17/2015    Past Medical History:  Diagnosis Date  . Arthritis 02/01/16   dx w/rheumatoid arthritis  . Carpal tunnel syndrome 10/20/2015   right  . Carpal tunnel syndrome on both sides   . Dysuria   . Hypokalemia   . Liver disease    hepatitis c  . Seasonal allergies   . Seizures (HCC)    last 05/05/15      Family History  Problem Relation Age of Onset  . Pancreatic cancer Mother   . COPD Father   . Diabetes Brother    Past Surgical History:  Procedure Laterality Date  . LEFT HEART CATH AND CORONARY ANGIOGRAPHY N/A 04/30/2019   Procedure: LEFT HEART CATH AND CORONARY ANGIOGRAPHY;  Surgeon: 05/02/2019, MD;  Location: [facility];  Service: Cardiovascular;  Laterality: N/A;  . TONSILLECTOMY     Social History   Social History Narrative  . Not on file   Immunization History  Administered Date(s) Administered  . Influenza,inj,Quad PF,6+ Mos 05/16/2017, 05/15/2018  . Tdap 06/26/2018  . Zoster Recombinat (Shingrix) 09/04/2017     Objective: Vital Signs: There were no vitals taken for this visit.   Physical Exam   Musculoskeletal Exam: ***  CDAI Exam: CDAI Score: -- Patient Global: --; Provider Global: -- Swollen: --; Tender: -- Joint Exam 06/17/2020   No joint exam has been documented for this visit   There is currently no information documented on the homunculus. Go to the Rheumatology activity and complete the homunculus joint exam.  Investigation: No additional findings.  Imaging: No results found.  Recent Labs: Lab Results  Component Value Date   WBC 8.5 12/09/2019   HGB 15.3 12/09/2019   PLT 201 12/09/2019   NA 143 12/09/2019   K 3.7 12/09/2019   CL 104 12/09/2019   CO2 30 12/09/2019   GLUCOSE 93 12/09/2019   BUN 11 12/09/2019   CREATININE 0.82 12/09/2019   BILITOT 0.4 12/09/2019   ALKPHOS 69  04/29/2017   AST 22 12/09/2019   ALT 29 12/09/2019   PROT 6.2 12/09/2019   ALBUMIN 4.2 04/29/2017   CALCIUM 9.4 12/09/2019   GFRAA 91 12/09/2019   QFTBGOLDPLUS NEGATIVE 12/09/2019    Speciality Comments: Need Hep C quant every 6 months Prior therapy: methotrexate (oral ulcers) and Cimzia (inadequate response)   Procedures:  No procedures performed Allergies: Patient has no known allergies.   Assessment / Plan:     Visit Diagnoses: No  diagnosis found.  Orders: No orders of the defined types were placed in this encounter.  No orders of the defined types were placed in this encounter.   Face-to-face time spent with patient was *** minutes. Greater than 50% of time was spent in counseling and coordination of care.  Follow-Up Instructions: No follow-ups on file.   Ellen Henri, CMA  Note - This record has been created using Animal nutritionist.  Chart creation errors have been sought, but may not always  have been located. Such creation errors do not reflect on  the standard of medical care.

## 2020-06-17 ENCOUNTER — Ambulatory Visit: Payer: Medicare Other | Admitting: Physician Assistant

## 2020-06-21 NOTE — Progress Notes (Signed)
Office Visit Note  Patient: Christina Jenkins             Date of Birth: 1960-11-25           MRN: [id number]             PCP: Encarnacion Slates, PA-C Referring: Encarnacion Slates, PA-C Visit Date: 07/01/2020 Occupation: @GUAROCC @  Subjective:  Discuss medication options   History of Present Illness: Christina Jenkins is a 59 y.o. female with history of seropositive rheumatoid arthritis and osteoarthritis.  She is on Actemra 162 mg subcutaneous injections every 14 days and Arava 20 mg by mouth daily.  Her most recent Actemra injection was yesterday on 06/30/2020.  She has been having frequent and severe rheumatoid arthritis flares recently.  She brought several pictures of the swelling she has been experiencing in her hands.  She is currently having pain in both shoulders, both elbows, both hands, and both feet.  She states that she has been experiencing nocturnal pain which is made it difficult laying on her shoulders at night. She reports that 3 months ago she quit smoking cold [place] with her husband. She denies any recent infections.  She has received both COVID-19 vaccinations.     Activities of Daily Living:  Patient reports morning stiffness for several hours.   Patient Reports nocturnal pain.  Difficulty dressing/grooming: Denies Difficulty climbing stairs: Denies Difficulty getting out of chair: Reports Difficulty using hands for taps, buttons, cutlery, and/or writing: Reports  Review of Systems  Constitutional: Negative for fatigue.  HENT: Negative for mouth sores, mouth dryness and nose dryness.   Eyes: Negative for pain, itching and dryness.  Respiratory: Negative for shortness of breath and difficulty breathing.   Cardiovascular: Negative for chest pain and palpitations.  Gastrointestinal: Negative for blood in stool, constipation and diarrhea.  Endocrine: Negative for increased urination.  Genitourinary: Negative for difficulty urinating.  Musculoskeletal: Positive for  arthralgias, joint pain, joint swelling, myalgias, morning stiffness, muscle tenderness and myalgias.  Skin: Positive for redness. Negative for color change and rash.  Allergic/Immunologic: Negative for susceptible to infections.  Neurological: Negative for dizziness, numbness, headaches, memory loss and weakness.  Hematological: Negative for bruising/bleeding tendency.  Psychiatric/Behavioral: Negative for confusion.    PMFS History:  Patient Active Problem List   Diagnosis Date Noted  . Chest pain of uncertain etiology 04/30/2019  . Abnormal nuclear stress test   . History of COPD 05/31/2017  . Fatigue 12/04/2016  . History of seizures 10/23/2016  . History of hepatitis C 10/23/2016  . ANA positive 10/23/2016  . Sicca syndrome, unspecified 10/23/2016  . Seropositive rheumatoid arthritis (HCC) 08/01/2016  . High risk medication use 08/01/2016  . Hepatitis C 08/01/2016  . Tobacco abuse 08/01/2016  . Depression 08/01/2016  . Complex partial seizure (HCC) 02/02/2016  . MCI (mild cognitive impairment) 11/02/2015  . Arthralgia 11/02/2015  . Carpal tunnel syndrome 10/20/2015  . Syncope 05/17/2015  . Convulsions/seizures (HCC) 05/17/2015    Past Medical History:  Diagnosis Date  . Arthritis 02/01/16   dx w/rheumatoid arthritis  . Carpal tunnel syndrome 10/20/2015   right  . Carpal tunnel syndrome on both sides   . Dysuria   . Hypokalemia   . Liver disease    hepatitis c  . Seasonal allergies   . Seizures (HCC)    last 05/05/15    Family History  Problem Relation Age of Onset  . Pancreatic cancer Mother   . COPD Father   .  Diabetes Brother    Past Surgical History:  Procedure Laterality Date  . LEFT HEART CATH AND CORONARY ANGIOGRAPHY N/A 04/30/2019   Procedure: LEFT HEART CATH AND CORONARY ANGIOGRAPHY;  Surgeon: Runell Gess, MD;  Location: [facility];  Service: Cardiovascular;  Laterality: N/A;  . TONSILLECTOMY     Social History   Social History  Narrative  . Not on file   Immunization History  Administered Date(s) Administered  . Influenza,inj,Quad PF,6+ Mos 05/16/2017, 05/15/2018  . Moderna SARS-COVID-2 Vaccination 11/16/2019, 12/14/2019  . Tdap 06/26/2018  . Zoster Recombinat (Shingrix) 09/04/2017     Objective: Vital Signs: BP (!) 148/84 (BP Location: Left Arm, Patient Position: Sitting, Cuff Size: Normal)   Pulse 80   Resp 16   Ht 5\' 6"  (1.676 m)   Wt 244 lb 12.8 oz (111 kg)   BMI 39.51 kg/m    Physical Exam Vitals and nursing note reviewed.  Constitutional:      Appearance: She is well-developed.  HENT:     Head: Normocephalic and atraumatic.  Eyes:     Conjunctiva/sclera: Conjunctivae normal.  Pulmonary:     Effort: Pulmonary effort is normal.  Abdominal:     Palpations: Abdomen is soft.  Musculoskeletal:     Cervical back: Normal range of motion.  Skin:    General: Skin is warm and dry.     Capillary Refill: Capillary refill takes less than 2 seconds.  Neurological:     Mental Status: She is alert and oriented to person, place, and time.  Psychiatric:        Behavior: Behavior normal.      Musculoskeletal Exam: C-spine, thoracic spine, and lumbar spine good ROM.  Painful but full ROM of both shoulder joints.  Tenderness over the joint line of both elbow joints.  Wrist joints good ROM with no tenderness or synovitis. She has tenderness and synovitis of the right 2nd MCP and left 2nd and 3rd MCP joints.  Tenderness of the right 3rd and 4th MCP and left 4th MCP joints.  Knee joints good ROM with no warmth or effusion.  Ankle joints good ROM with no tenderness or inflammation.  Tenderness on the dorsal aspect of both feet. PIP and DIP thickening consistent with osteoarthritis of both feet. Tenderness and synovitis of the right 1st PIP and left 5th MTP joint.  Thickening of bilateral 1st MTP joints.   CDAI Exam: CDAI Score: 14.4  Patient Global: 7 mm; Provider Global: 7 mm Swollen: 5 ; Tender: 14  Joint  Exam 07/01/2020      Right  Left  Glenohumeral   Tender   Tender  Elbow   Tender   Tender  MCP 2  Swollen Tender  Swollen Tender  MCP 3   Tender  Swollen Tender  MCP 4   Tender   Tender  Tarsometatarsal   Tender   Tender  MTP 5     Swollen Tender  PIP 1 (toe)  Swollen Tender        Investigation: No additional findings.  Imaging: No results found.  Recent Labs: Lab Results  Component Value Date   WBC 8.5 12/09/2019   HGB 15.3 12/09/2019   PLT 201 12/09/2019   NA 143 12/09/2019   K 3.7 12/09/2019   CL 104 12/09/2019   CO2 30 12/09/2019   GLUCOSE 93 12/09/2019   BUN 11 12/09/2019   CREATININE 0.82 12/09/2019   BILITOT 0.4 12/09/2019   ALKPHOS 69 04/29/2017   AST  22 12/09/2019   ALT 29 12/09/2019   PROT 6.2 12/09/2019   ALBUMIN 4.2 04/29/2017   CALCIUM 9.4 12/09/2019   GFRAA 91 12/09/2019   QFTBGOLDPLUS NEGATIVE 12/09/2019    Speciality Comments: Need Hep C quant every 6 months Prior therapy: methotrexate (oral ulcers) and Cimzia (inadequate response)   Procedures:  No procedures performed Allergies: Patient has no known allergies.   Assessment / Plan:     Visit Diagnoses: Rheumatoid arthritis with rheumatoid factor of multiple sites without organ or systems involvement [facility]): She presents today with pain and inflammation in multiple joints as described above.  She has been experiencing recurrent flares over the past 2 months.  She brought a picture with her today of swelling and erythema over the MCPs of the right and left hand which was about 2 weeks ago.  She is currently on Actemra 162 mg subcutaneous injections every 14 days and Arava 20 mg 1 tablet by mouth daily.  Her most recent dose of Actemra was yesterday on 06/30/2020.  We discussed that his combination of medications is not managing her rheumatoid arthritis.  Different treatment options were discussed today in detail.  Indications, contraindications, potential side effects of Enbrel were discussed.  All  questions were addressed and consent was obtained today.  She was also shown the auto touch device which she feels she will be comfortable using in the future.  We will apply for Enbrel through her insurance and once approved she will return to the office for administration of the first injection.  Injection site technique will be discussed in detail at that visit.  She can be scheduled on or after 07/14/2020.  Lab work from 06/22/2020 was reviewed today in the office.  ALT was elevated at 45.  She was advised to reduce the dose of Arava to 10 mg alternating with 20 mg every other day.  She will return for lab work in 1 month and every 3 months to monitor for drug toxicity.  A prednisone taper starting at 20 mg tapering by 5 mg every week will be sent to the pharmacy today.  She will follow-up in the office in 8 weeks to assess her response to combination therapy of Enbrel and Arava.  Medication counseling:   TB Test: negative on 12/09/19 Hepatitis panel: 12/28/19, Hep C RNA quant negative  HIV: Negative 12/28/15 SPEP: 12/28/19 Immunoglobulins: WNL 12/28/15  Chest x-ray: Ordered.  enbrel new start visit pending CXR.  Does patient have diagnosis of heart failure?  No  Counseled patient that Enbrel is a TNF blocking agent.  Reviewed Enbrel dose of 50 mg once weekly.  Counseled patient on purpose, proper use, and adverse effects of Enbrel.  Reviewed the most common adverse effects including infections, headache, and injection site reactions. Discussed that there is the possibility of an increased risk of malignancy but it is not well understood if this increased risk is due to the medication or the disease state.  Advised patient to get yearly dermatology exams due to risk of skin cancer.  Reviewed the importance of regular labs while on Enbrel therapy.  Advised patient to get standing labs one month after starting Enbrel then every 2 months.  Provided patient with standing lab orders.  Counseled patient that  Enbrel should be held prior to scheduled surgery.  Counseled patient to avoid live vaccines while on Enbrel.  Advised patient to get annual influenza vaccine and the pneumococcal vaccine as needed.  Provided patient with medication education material and  answered all questions.  Patient voiced understanding.  Patient consented to Enbrel.  Will upload consent into the media tab.  Reviewed storage instructions for Enbrel.  Advised initial injection must be administered in office.  Patient voiced understanding.   High risk medication use - Applying for Enbrel 50 mg subcutaneous injections once weekly.  She will require patient assistance.  Once approved she will be eligible to start on Enbrel on or after 07/14/2020.  Her most recent Actemra injection was on 06/30/2020.  She will continue on Arava but was advised to reduce the dose from 20 mg daily to 10 mg alternating with 20 mg every other day due to elevated LFTs.  She will discontinue Actemra due to inadequate response.  She previously had an inadequate response to Cimzia.  She discontinued methotrexate in the past due to recurrent oral ulcerations.   She has not had any recent infections.  She has received both COVID-19 vaccinations.  She was given information about the COVID-19 vaccination today. We discussed the importance of holding enbrel and arava if she develops signs or symptoms of an infection and to resume once the infection has cleared. She voiced understanding.  Chronic left shoulder pain: Chronic pain. She has full ROM on exam today with discomfort.  She has been experiencing increased discomfort in both shoulder joints over the past several weeks.  She experiences significant nocturnal pain.  Her most recent cortisone injection was performed on 03/09/20.  A prednisone taper will be sent to the pharmacy.  Primary osteoarthritis of both hands: She has PIP and DIP thickening consistent with osteoarthritis of both hands.  She has complete fist  formation bilaterally.  She has been experiencing increased pain and stiffness in both hands over the past several weeks.  She brought pictures of swelling over the MCP joints about 2 weeks ago.  And prednisone taper will be sent to the pharmacy today.  Primary osteoarthritis of both feet: She has PIP, DIP, and 1st MTP thickening.  Tenderness and swelling of the right 1st PIP and left 5th MTP joint.  Discussed the importance of wearing proper fitting shoes.   ANA positive: She has no clinical features of autoimmune disease at this time.  We discussed clinical signs and symptoms to monitor for.  Trochanteric bursitis of both hips: Resolved   Former smoker: She quit smoking cold [place] with her husband 3 months ago.   Association of heart disease with rheumatoid arthritis was discussed. Need to monitor blood pressure, cholesterol, and to exercise 30-60 minutes on daily basis was discussed. Poor dental hygiene can be a predisposing factor for rheumatoid arthritis. Good dental hygiene was discussed.  Other medical conditions are listed as follows:   History of COPD  History of seizures  History of hepatitis C: HCV quant negative on 04/07/19.     Orders: Orders Placed This Encounter  Procedures  . DG Chest 2 View   Meds ordered this encounter  Medications  . predniSONE (DELTASONE) 5 MG tablet    Sig: Take 4 tabs po qd x 7 days, 3  tabs po qd x 7 days, 2  tabs po qd x 7 days, 1  tab po qd x 7 days    Dispense:  70 tablet    Refill:  0  . leflunomide (ARAVA) 10 MG tablet    Sig: Take  by mouth alternating with  by mouth every other day.    Dispense:  135 tablet    Refill:  0    .  Follow-Up Instructions: Return in about 5 months (around 11/29/2020) for Rheumatoid arthritis, Osteoarthritis.   Gearldine Bienenstock, PA-C  Note - This record has been created using Dragon software.  Chart creation errors have been sought, but may not always  have been located. Such creation errors  do not reflect on  the standard of medical care.

## 2020-06-22 DIAGNOSIS — E782 Mixed hyperlipidemia: Secondary | ICD-10-CM | POA: Diagnosis not present

## 2020-06-22 DIAGNOSIS — E876 Hypokalemia: Secondary | ICD-10-CM | POA: Diagnosis not present

## 2020-06-22 DIAGNOSIS — I10 Essential (primary) hypertension: Secondary | ICD-10-CM | POA: Diagnosis not present

## 2020-06-22 DIAGNOSIS — R739 Hyperglycemia, unspecified: Secondary | ICD-10-CM | POA: Diagnosis not present

## 2020-06-24 DIAGNOSIS — E782 Mixed hyperlipidemia: Secondary | ICD-10-CM | POA: Diagnosis not present

## 2020-06-24 DIAGNOSIS — Z79899 Other long term (current) drug therapy: Secondary | ICD-10-CM | POA: Diagnosis not present

## 2020-06-24 DIAGNOSIS — I8310 Varicose veins of unspecified lower extremity with inflammation: Secondary | ICD-10-CM | POA: Diagnosis not present

## 2020-06-24 DIAGNOSIS — R5383 Other fatigue: Secondary | ICD-10-CM | POA: Diagnosis not present

## 2020-06-24 DIAGNOSIS — I872 Venous insufficiency (chronic) (peripheral): Secondary | ICD-10-CM | POA: Diagnosis not present

## 2020-07-01 ENCOUNTER — Other Ambulatory Visit: Payer: Self-pay

## 2020-07-01 ENCOUNTER — Encounter: Payer: Self-pay | Admitting: Physician Assistant

## 2020-07-01 ENCOUNTER — Ambulatory Visit: Payer: Medicare Other | Admitting: Physician Assistant

## 2020-07-01 ENCOUNTER — Telehealth: Payer: Self-pay

## 2020-07-01 VITALS — BP 148/84 | HR 80 | Resp 16 | Ht 66.0 in | Wt 244.8 lb

## 2020-07-01 DIAGNOSIS — M19071 Primary osteoarthritis, right ankle and foot: Secondary | ICD-10-CM | POA: Diagnosis not present

## 2020-07-01 DIAGNOSIS — M19042 Primary osteoarthritis, left hand: Secondary | ICD-10-CM

## 2020-07-01 DIAGNOSIS — M0579 Rheumatoid arthritis with rheumatoid factor of multiple sites without organ or systems involvement: Secondary | ICD-10-CM

## 2020-07-01 DIAGNOSIS — Z79899 Other long term (current) drug therapy: Secondary | ICD-10-CM

## 2020-07-01 DIAGNOSIS — M19072 Primary osteoarthritis, left ankle and foot: Secondary | ICD-10-CM

## 2020-07-01 DIAGNOSIS — Z720 Tobacco use: Secondary | ICD-10-CM

## 2020-07-01 DIAGNOSIS — Z87891 Personal history of nicotine dependence: Secondary | ICD-10-CM

## 2020-07-01 DIAGNOSIS — Z87898 Personal history of other specified conditions: Secondary | ICD-10-CM

## 2020-07-01 DIAGNOSIS — M25512 Pain in left shoulder: Secondary | ICD-10-CM

## 2020-07-01 DIAGNOSIS — M7062 Trochanteric bursitis, left hip: Secondary | ICD-10-CM

## 2020-07-01 DIAGNOSIS — M7061 Trochanteric bursitis, right hip: Secondary | ICD-10-CM

## 2020-07-01 DIAGNOSIS — Z8709 Personal history of other diseases of the respiratory system: Secondary | ICD-10-CM

## 2020-07-01 DIAGNOSIS — R768 Other specified abnormal immunological findings in serum: Secondary | ICD-10-CM

## 2020-07-01 DIAGNOSIS — G8929 Other chronic pain: Secondary | ICD-10-CM

## 2020-07-01 DIAGNOSIS — Z8619 Personal history of other infectious and parasitic diseases: Secondary | ICD-10-CM

## 2020-07-01 DIAGNOSIS — M19041 Primary osteoarthritis, right hand: Secondary | ICD-10-CM

## 2020-07-01 MED ORDER — PREDNISONE 5 MG PO TABS: ORAL_TABLET | ORAL | 0 refills | Status: DC

## 2020-07-01 MED ORDER — LEFLUNOMIDE 10 MG PO TABS
ORAL_TABLET | ORAL | 0 refills | Status: DC
Start: 2020-07-01 — End: 2020-08-24

## 2020-07-01 NOTE — Telephone Encounter (Signed)
Received notification from [facility] regarding a prior authorization for ENBREL. Authorization has been APPROVED from 07/01/20 to 09/02/21.   Authorization # [id number]  Ran test claim for 1 month supply, patient's copay is $1792.43. Office to mail patient a PAP application to patient

## 2020-07-01 NOTE — Patient Instructions (Addendum)
Standing Labs We placed an order today for your standing lab work.   Please have your standing labs drawn in 1 month and then every 3 months   If possible, please have your labs drawn 2 weeks prior to your appointment so that the provider can discuss your results at your appointment.  We have open lab daily Monday through Thursday from 8:30-12:30 PM and 1:30-4:30 PM and Friday from 8:30-12:30 PM and 1:30-4:00 PM at the office of Dr. Pollyann Savoy, [facility].   Please be advised, patients with office appointments requiring lab work will take precedents over walk-in lab work.  If possible, please come for your lab work on Monday and Friday afternoons, as you may experience shorter wait times. The office is located at [address], [address], [place], [place] [address] No appointment is necessary.   Labs are drawn by [facility]. Please bring your co-pay at the time of your lab draw.  You may receive a bill from [facility] for your lab work.  If you wish to have your labs drawn at another location, please call the office 24 hours in advance to send orders.  If you have any questions regarding directions or hours of operation,  please call [phone].   As a reminder, please drink plenty of water prior to coming for your lab work. Thanks!    Etanercept injection What is this medicine? ETANERCEPT (et a [employer]) is used for the treatment of rheumatoid arthritis in adults and children. The medicine is also used to treat psoriatic arthritis, ankylosing spondylitis, and psoriasis. This medicine may be used for other purposes; ask your health care provider or pharmacist if you have questions. COMMON BRAND NAME(S): Enbrel What should I tell my health care provider before I take this medicine? They need to know if you have any of these conditions:  blood disorders  cancer  congestive heart failure  diabetes  exposure to chickenpox  immune system  problems  infection  multiple sclerosis  seizure disorder  tuberculosis, a positive skin test for tuberculosis or have recently been in close contact with someone who has tuberculosis  Wegener's granulomatosis  an unusual or allergic reaction to etanercept, latex, other medicines, foods, dyes, or preservatives  pregnant or trying to get pregnant  breast-feeding How should I use this medicine? The medicine is given by injection under the skin. You will be taught how to prepare and give this medicine. Use exactly as directed. Take your medicine at regular intervals. Do not take your medicine more often than directed. It is important that you put your used needles and syringes in a special sharps container. Do not put them in a trash can. If you do not have a sharps container, call your pharmacist or healthcare provider to get one. A special MedGuide will be given to you by the pharmacist with each prescription and refill. Be sure to read this information carefully each time. Talk to your pediatrician regarding the use of this medicine in children. While this drug may be prescribed for children as young as 59 years of age for selected conditions, precautions do apply. Overdosage: If you think you have taken too much of this medicine contact a poison control center or emergency room at once. NOTE: This medicine is only for you. Do not share this medicine with others. What if I miss a dose? If you miss a dose, contact your health care professional to find out when you should take your next dose. Do not  take double or extra doses without advice. What may interact with this medicine? Do not take this medicine with any of the following medications:  anakinra This medicine may also interact with the following medications:  cyclophosphamide  sulfasalazine  vaccines This list may not describe all possible interactions. Give your health care provider a list of all the medicines, herbs,  non-prescription drugs, or dietary supplements you use. Also tell them if you smoke, drink alcohol, or use illegal drugs. Some items may interact with your medicine. What should I watch for while using this medicine? Tell your doctor or healthcare professional if your symptoms do not start to get better or if they get worse. You will be tested for tuberculosis (TB) before you start this medicine. If your doctor prescribes any medicine for TB, you should start taking the TB medicine before starting this medicine. Make sure to finish the full course of TB medicine. Call your doctor or health care professional for advice if you get a fever, chills or sore throat, or other symptoms of a cold or flu. Do not treat yourself. This drug decreases your body's ability to fight infections. Try to avoid being around people who are sick. What side effects may I notice from receiving this medicine? Side effects that you should report to your doctor or health care professional as soon as possible:  allergic reactions like skin rash, itching or hives, swelling of the face, lips, or tongue  changes in vision  fever, chills or any other sign of infection  numbness or tingling in legs or other parts of the body  red, scaly patches or raised bumps on the skin  shortness of breath or difficulty breathing  swollen lymph nodes in the neck, underarm, or groin areas  unexplained weight loss  unusual bleeding or bruising  unusual swelling or fluid retention in the legs  unusually weak or tired Side effects that usually do not require medical attention (report to your doctor or health care professional if they continue or are bothersome):  dizziness  headache  nausea  redness, itching, or swelling at the injection site  vomiting This list may not describe all possible side effects. Call your doctor for medical advice about side effects. You may report side effects to FDA at 1-800-FDA-1088. Where should  I keep my medicine? Keep out of the reach of children. Enbrel products: Store unopened Enbrel vials, cartridges, or pens in a refrigerator between 2 and 8 degrees C (36 and 46 degrees F). Do not freeze. Do not shake. Keep in the original container to protect from light. Throw away any unopened and unused medicine that has been stored in the refrigerator after the expiration date. If needed, you may store an Enbrel single-use vial, single-dose prefilled syringe, SureClick autoinjector, or Enbrel Mini cartridge at room temperature between 20 to 25 degrees C (68 to 77 degrees F) for up to 14 days. Protect from light, heat, and moisture. Do not shake. Once any of these items are stored at room temperature, do not place them back into the refrigerator. Throw the item away after 14 days, even if it still contains medicine. If you are using the Enbrel multi-dose vials, you will be instructed on how to dilute and store this medicine once it has been diluted. The Enbrel AutoTouch reusable autoinjector device should be stored at room temperature. Do NOT refrigerate the AutoTouch reusable autoinjector. Erelzi products: Store Presenter, [employer] in the refrigerator between 2 and 8 degrees  C (36 and 46 degrees F). Do not freeze. Do not shake. Keep in the original container to protect from light. Throw away any unopened and unused medicine that has been stored in the refrigerator after the expiration date. If needed, you may store the [facility] prefilled syringe or pen at room temperature between 20 to 25 degrees C (68 to 77 degrees F) for up to 28 days. Protect from light, heat, and moisture. Do not shake. Once any of these items are stored at room temperature, do not place them back into the refrigerator. Throw the item away after 28 days, even if it still contains medicine. NOTE: This sheet is a summary. It may not cover all possible information. If you have questions about this medicine, talk to  your doctor, pharmacist, or health care provider.  2020 Elsevier/Gold Standard (2018-11-17 09:49:58)   COVID-19 vaccine recommendations:   COVID-19 vaccine is recommended for everyone (unless you are allergic to a vaccine component), even if you are on a medication that suppresses your immune system.   If you are on Methotrexate, Cellcept (mycophenolate), Rinvoq, Harriette Ohara, and Olumiant- hold the medication for 1 week after each vaccine. Hold Methotrexate for 2 weeks after the single dose COVID-19 vaccine.   If you are on Orencia subcutaneous injection - hold medication one week prior to and one week after the first COVID-19 vaccine dose (only).   If you are on Orencia IV infusions- time vaccination administration so that the first COVID-19 vaccination will occur four weeks after the infusion and postpone the subsequent infusion by one week.   If you are on Cyclophosphamide or Rituxan infusions please contact your doctor prior to receiving the COVID-19 vaccine.   Do not take Tylenol or any anti-inflammatory medications (NSAIDs) 24 hours prior to the COVID-19 vaccination.   There is no direct evidence about the efficacy of the COVID-19 vaccine in individuals who are on medications that suppress the immune system.   Even if you are fully vaccinated, and you are on any medications that suppress your immune system, please continue to wear a mask, maintain at least six feet social distance and practice hand hygiene.   If you develop a COVID-19 infection, please contact your PCP or our office to determine if you need monoclonal antibody infusion.  The booster vaccine is now available for immunocompromised patients.   Please see the following web sites for updated information.   [email or web address]

## 2020-07-01 NOTE — Telephone Encounter (Signed)
Submitted a Prior Authorization request to [facility] for ENBREL via Cover My Meds. Will update once we receive a response.   (KeyLeonia Reeves) - [id number]  \

## 2020-07-01 NOTE — Telephone Encounter (Signed)
Please apply for enbrel mini per Sherron Ales, PA-C. Thanks!   Consent obtained and sent to the scan center.

## 2020-07-12 DIAGNOSIS — Z23 Encounter for immunization: Secondary | ICD-10-CM | POA: Diagnosis not present

## 2020-07-13 ENCOUNTER — Telehealth: Payer: Self-pay | Admitting: Rheumatology

## 2020-07-13 NOTE — Telephone Encounter (Signed)
Patient due for Actemra injection tomorrow. Patient was to start Enbrel, but patient has been having car trouble, and could not get here to take first dose. Patient wants to schedule for next Thursday to do Enbrel. Please call to advise.

## 2020-07-13 NOTE — Telephone Encounter (Signed)
Spoke with patient and advised we have not received approval from the patient assistance program. Patient states she received a call stating she was approved. Reached out to Ronald Pippins to have her look into it. Patient advised not to take her Actemra as it would cause a delay in starting her Enbrel once approval has been received.

## 2020-07-13 NOTE — Telephone Encounter (Signed)
No notes in patient's chart that application has been received back by office.  Called [employer], they do not have patient in their system. The call patient is referring to is about the insurance approval.   Still waiting for patient to return mailed application. Called patient, left message.

## 2020-07-14 NOTE — Telephone Encounter (Signed)
Called patient to follow up on application, left message. 

## 2020-07-18 ENCOUNTER — Telehealth: Payer: Self-pay

## 2020-07-18 NOTE — Telephone Encounter (Signed)
Patient called checking the status of her Enbrel medication and if it has been approved.  Patient states she was told not to take her Actemra last Thursday, 07/14/20 and is requesting a return call.

## 2020-07-18 NOTE — Telephone Encounter (Signed)
Spoke with patient and advised that [employer] dose not have a patient assistance application on file for her. Faxed the application to patient for her to fill out and fax back. Patient advised since the Enbrel has not been approved yet she can go ahead with her Actemra injection and we will schedule new start to Enbrel based on approval and last dose of Actemra. Patient expressed understanding.

## 2020-07-22 DIAGNOSIS — I2510 Atherosclerotic heart disease of native coronary artery without angina pectoris: Secondary | ICD-10-CM | POA: Diagnosis not present

## 2020-07-22 DIAGNOSIS — I700 Atherosclerosis of aorta: Secondary | ICD-10-CM | POA: Diagnosis not present

## 2020-07-22 DIAGNOSIS — F17210 Nicotine dependence, cigarettes, uncomplicated: Secondary | ICD-10-CM | POA: Diagnosis not present

## 2020-07-22 DIAGNOSIS — J439 Emphysema, unspecified: Secondary | ICD-10-CM | POA: Diagnosis not present

## 2020-07-22 DIAGNOSIS — Z87891 Personal history of nicotine dependence: Secondary | ICD-10-CM | POA: Diagnosis not present

## 2020-07-22 NOTE — Telephone Encounter (Signed)
Submitted Patient Assistance Application to [employer] for ENBREL along with provider portion, PA and income documents. Will update patient when we receive a response.  Fax# [phone] Phone# [phone]  

## 2020-08-02 ENCOUNTER — Telehealth: Payer: Self-pay

## 2020-08-02 NOTE — Telephone Encounter (Signed)
Patient called stating she received the [employer] patient assistance application that was faxed to her from [facility].  Patient states she filled it out and faxed it back to the office that day.  Patient is checking the status of the application and requesting a return call.

## 2020-08-03 ENCOUNTER — Telehealth: Payer: Self-pay | Admitting: Rheumatology

## 2020-08-03 NOTE — Telephone Encounter (Signed)
Please also check if she has updated CXR.  She requires this prior to new start visit.

## 2020-08-03 NOTE — Telephone Encounter (Signed)
What is the status on enbrel patient assistance process?

## 2020-08-03 NOTE — Telephone Encounter (Signed)
Attempted to contact the patient and left message for patient to call the office.  

## 2020-08-03 NOTE — Telephone Encounter (Signed)
Called [employer], application is in process. Called patient to advise, left message. Will update her when we receive a response.

## 2020-08-03 NOTE — Telephone Encounter (Signed)
Per note from Rachael on 08/02/2020 [employer], application is in process. Called patient to advise, left message. Will update her when we receive a response

## 2020-08-03 NOTE — Telephone Encounter (Addendum)
Patient is due for Actemra tomorrow. Does she continue with it until she hears from patient assistance for Enbrel? Please call to advise.

## 2020-08-11 ENCOUNTER — Ambulatory Visit: Payer: Medicare Other | Admitting: Physician Assistant

## 2020-08-11 NOTE — Telephone Encounter (Signed)
Received fax from [employer] regarding ENBREL application's status currently being pending. They are requesting prior authorization.  PA already approved through 09/02/21. Faxed PA approval letter to [employer].  Fax: [phone]  Chesley Mires, PharmD, MPH Clinical Pharmacist (Rheumatology and Pulmonology)

## 2020-08-12 NOTE — Progress Notes (Deleted)
Office Visit Note  Patient: Christina Jenkins             Date of Birth: 1961-03-15           MRN: [id number]             PCP: Encarnacion Slates, PA-C Referring: Encarnacion Slates, PA-C Visit Date: 08/25/2020 Occupation: @GUAROCC @  Subjective:  No chief complaint on file.   History of Present Illness: Christina Jenkins is a 59 y.o. female ***   Activities of Daily Living:  Patient reports morning stiffness for *** {minute/hour:19697}.   Patient {ACTIONS;DENIES/REPORTS:21021675::"Denies"} nocturnal pain.  Difficulty dressing/grooming: {ACTIONS;DENIES/REPORTS:21021675::"Denies"} Difficulty climbing stairs: {ACTIONS;DENIES/REPORTS:21021675::"Denies"} Difficulty getting out of chair: {ACTIONS;DENIES/REPORTS:21021675::"Denies"} Difficulty using hands for taps, buttons, cutlery, and/or writing: {ACTIONS;DENIES/REPORTS:21021675::"Denies"}  No Rheumatology ROS completed.   PMFS History:  Patient Active Problem List   Diagnosis Date Noted  . Chest pain of uncertain etiology 04/30/2019  . Abnormal nuclear stress test   . History of COPD 05/31/2017  . Fatigue 12/04/2016  . History of seizures 10/23/2016  . History of hepatitis C 10/23/2016  . ANA positive 10/23/2016  . Sicca syndrome, unspecified 10/23/2016  . Seropositive rheumatoid arthritis (HCC) 08/01/2016  . High risk medication use 08/01/2016  . Hepatitis C 08/01/2016  . Tobacco abuse 08/01/2016  . Depression 08/01/2016  . Complex partial seizure (HCC) 02/02/2016  . MCI (mild cognitive impairment) 11/02/2015  . Arthralgia 11/02/2015  . Carpal tunnel syndrome 10/20/2015  . Syncope 05/17/2015  . Convulsions/seizures (HCC) 05/17/2015    Past Medical History:  Diagnosis Date  . Arthritis 02/01/16   dx w/rheumatoid arthritis  . Carpal tunnel syndrome 10/20/2015   right  . Carpal tunnel syndrome on both sides   . Dysuria   . Hypokalemia   . Liver disease    hepatitis c  . Seasonal allergies   . Seizures (HCC)    last 05/05/15     Family History  Problem Relation Age of Onset  . Pancreatic cancer Mother   . COPD Father   . Diabetes Brother    Past Surgical History:  Procedure Laterality Date  . LEFT HEART CATH AND CORONARY ANGIOGRAPHY N/A 04/30/2019   Procedure: LEFT HEART CATH AND CORONARY ANGIOGRAPHY;  Surgeon: 05/02/2019, MD;  Location: [facility];  Service: Cardiovascular;  Laterality: N/A;  . TONSILLECTOMY     Social History   Social History Narrative  . Not on file   Immunization History  Administered Date(s) Administered  . Influenza,inj,Quad PF,6+ Mos 05/16/2017, 05/15/2018  . Moderna SARS-COVID-2 Vaccination 11/16/2019, 12/14/2019  . Tdap 06/26/2018  . Zoster Recombinat (Shingrix) 09/04/2017     Objective: Vital Signs: There were no vitals taken for this visit.   Physical Exam   Musculoskeletal Exam: ***  CDAI Exam: CDAI Score: -- Patient Global: --; Provider Global: -- Swollen: --; Tender: -- Joint Exam 08/25/2020   No joint exam has been documented for this visit   There is currently no information documented on the homunculus. Go to the Rheumatology activity and complete the homunculus joint exam.  Investigation: No additional findings.  Imaging: No results found.  Recent Labs: Lab Results  Component Value Date   WBC 8.5 12/09/2019   HGB 15.3 12/09/2019   PLT 201 12/09/2019   NA 143 12/09/2019   K 3.7 12/09/2019   CL 104 12/09/2019   CO2 30 12/09/2019   GLUCOSE 93 12/09/2019   BUN 11 12/09/2019   CREATININE 0.82 12/09/2019   BILITOT  0.4 12/09/2019   ALKPHOS 69 04/29/2017   AST 22 12/09/2019   ALT 29 12/09/2019   PROT 6.2 12/09/2019   ALBUMIN 4.2 04/29/2017   CALCIUM 9.4 12/09/2019   GFRAA 91 12/09/2019   QFTBGOLDPLUS NEGATIVE 12/09/2019    Speciality Comments: Need Hep C quant every 6 months Prior therapy: methotrexate (oral ulcers) and Cimzia (inadequate response)   Procedures:  No procedures performed Allergies: Patient has no known  allergies.   Assessment / Plan:     Visit Diagnoses: Rheumatoid arthritis with rheumatoid factor of multiple sites without organ or systems involvement (HCC)  High risk medication use  Chronic left shoulder pain  Primary osteoarthritis of both hands  Primary osteoarthritis of both feet  ANA positive  Trochanteric bursitis of both hips  History of COPD  History of seizures  History of hepatitis C  Former smoker  Orders: No orders of the defined types were placed in this encounter.  No orders of the defined types were placed in this encounter.   Face-to-face time spent with patient was *** minutes. Greater than 50% of time was spent in counseling and coordination of care.  Follow-Up Instructions: No follow-ups on file.   Gearldine Bienenstock, PA-C  Note - This record has been created using Dragon software.  Chart creation errors have been sought, but may not always  have been located. Such creation errors do not reflect on  the standard of medical care.

## 2020-08-24 ENCOUNTER — Other Ambulatory Visit: Payer: Self-pay

## 2020-08-24 MED ORDER — LEFLUNOMIDE 10 MG PO TABS: ORAL_TABLET | ORAL | 0 refills | Status: DC

## 2020-08-24 MED ORDER — ACTEMRA 162 MG/0.9ML ~~LOC~~ SOSY: 162.0000 mg | PREFILLED_SYRINGE | SUBCUTANEOUS | 0 refills | Status: DC

## 2020-08-24 MED ORDER — PREDNISONE 5 MG PO TABS: ORAL_TABLET | ORAL | 0 refills | Status: DC

## 2020-08-24 NOTE — Telephone Encounter (Signed)
I will refill prednisone taper as per patient's request as she is having a flare.  I will also refill Arava.  We can keep her on Actemra until Enbrel is approved.

## 2020-08-24 NOTE — Telephone Encounter (Signed)
(  1) Patient called stating she received a letter from [employer] dated 08/11/20 stating she needs to be turned down by her insurance company first before she can apply for patient assistance.  Patient states a copy of the letter was sent to our office.  Patient states she was scheduled for an appointment tomorrow 08/25/20, but since she hasn't received the medication is requesting a return call to let her know when to reschedule.  (2) Patient states she also needs prescription refills of  Arava and Prednisone to be sent to [facility] at [address] and Actemra to be sent to the specialty pharmacy.

## 2020-08-24 NOTE — Telephone Encounter (Signed)
Spoke with patient and advised the prescriptions have been pended for Dr. Corliss Skains to review. Patient advised will contact her once they have been reviewed. Patient expressed understanding.

## 2020-08-24 NOTE — Addendum Note (Signed)
Addended by: Henriette Combs on: 08/24/2020 04:01 PM   Modules accepted: Orders

## 2020-08-24 NOTE — Telephone Encounter (Addendum)
Verified with [employer] that application is still pending. Notified patient that it's been taken care of. Verbalized understanding.  Patient's last Actemra was 08/18/20. Would be due 09/01/20.  Refills on other medications pending Dr. Corliss Skains. Patient receives Actemra through [place] patient assistance and she no longer has pens left at home. Sherron Ales, PA-C, has been continuing Actemra therapy until Enbrel is approved.

## 2020-08-24 NOTE — Telephone Encounter (Signed)
Last Visit: 07/01/2020 Next Visit: 08/25/2020  Labs: 06/22/2020  ALT was elevated at 45 Sodium 146, A/G ratio 2.3  Current Dose per office note 07/01/2020:  Arava to 10 mg alternating with 20 mg every other day DX: Rheumatoid arthritis with rheumatoid factor of multiple sites without organ or systems involvement   Spoke with patient and she states she is having a flare and is requesting a prednisone taper. Patient is still waiting for approval for Enbrel through patient assistance. Patient is also requesting a refill on Actemra. Please advise.

## 2020-08-25 ENCOUNTER — Ambulatory Visit: Payer: Medicare Other | Admitting: Rheumatology

## 2020-08-25 DIAGNOSIS — M7062 Trochanteric bursitis, left hip: Secondary | ICD-10-CM

## 2020-08-25 DIAGNOSIS — M0579 Rheumatoid arthritis with rheumatoid factor of multiple sites without organ or systems involvement: Secondary | ICD-10-CM

## 2020-08-25 DIAGNOSIS — Z87891 Personal history of nicotine dependence: Secondary | ICD-10-CM

## 2020-08-25 DIAGNOSIS — M19071 Primary osteoarthritis, right ankle and foot: Secondary | ICD-10-CM

## 2020-08-25 DIAGNOSIS — G8929 Other chronic pain: Secondary | ICD-10-CM

## 2020-08-25 DIAGNOSIS — Z8619 Personal history of other infectious and parasitic diseases: Secondary | ICD-10-CM

## 2020-08-25 DIAGNOSIS — Z8709 Personal history of other diseases of the respiratory system: Secondary | ICD-10-CM

## 2020-08-25 DIAGNOSIS — M19041 Primary osteoarthritis, right hand: Secondary | ICD-10-CM

## 2020-08-25 DIAGNOSIS — Z87898 Personal history of other specified conditions: Secondary | ICD-10-CM

## 2020-08-25 DIAGNOSIS — Z79899 Other long term (current) drug therapy: Secondary | ICD-10-CM

## 2020-08-25 DIAGNOSIS — R768 Other specified abnormal immunological findings in serum: Secondary | ICD-10-CM

## 2020-09-02 DIAGNOSIS — Z720 Tobacco use: Secondary | ICD-10-CM | POA: Diagnosis not present

## 2020-09-02 DIAGNOSIS — E7849 Other hyperlipidemia: Secondary | ICD-10-CM | POA: Diagnosis not present

## 2020-09-02 DIAGNOSIS — J449 Chronic obstructive pulmonary disease, unspecified: Secondary | ICD-10-CM | POA: Diagnosis not present

## 2020-09-02 DIAGNOSIS — I10 Essential (primary) hypertension: Secondary | ICD-10-CM | POA: Diagnosis not present

## 2020-09-05 ENCOUNTER — Telehealth: Payer: Self-pay

## 2020-09-05 DIAGNOSIS — Z79899 Other long term (current) drug therapy: Secondary | ICD-10-CM

## 2020-09-05 DIAGNOSIS — Z8619 Personal history of other infectious and parasitic diseases: Secondary | ICD-10-CM

## 2020-09-05 NOTE — Telephone Encounter (Signed)
Patient called to check on the status of her Enbrel medication.  Patient states she received the same later dated 08/11/20 from [employer] letting her know that she needs to be turned down by her insurance company before she can apply for patient assistance.   Patient states she is also checking on the status of her Actemra medication.  Patient states there is a Sport and exercise psychologist of the medication and is not sure when it will be in stock.

## 2020-09-05 NOTE — Telephone Encounter (Addendum)
Patient approved to receive Enbrel through [employer] PAP today.  Returned patient's call to advise about approval. Letter she received should be discarded. Told her to expect approval letter in mail this week.  Labs (prefers to have drawn at PCP's office since there is cost incurred with [facility] at rheum):  CBC/CMP last 06/22/20. Needs updated baseline, then 1 month, and every 3 months.  TB gold negative on 12/09/19. Next due 12/2020.  Last Hep C RNA quantitative undetectable 04/07/19. Need to monitor every 6 months d/t history of HCV  Hepatitis B Surface Antigen and Core Antibody negative (12/28/15)  Hepatitis A antibody negative (12/28/15)  HIV negative (12/28/15)  Patient had CT lung on 07/22/20 (in Care Everywhere). Results: Heart size is within normal limits. No pericardial effusion. Mild aortic atherosclerosis. Coronary artery calcifications.  She was due for Actemra last Thursday, 09/01/20.   Ladona Ridgel - when would you like to start her on the Enbrel? And is her CT lung sufficient for her baseline chest exam?  Chesley Mires, PharmD, MPH Clinical Pharmacist (Rheumatology and Pulmonology)

## 2020-09-05 NOTE — Telephone Encounter (Signed)
Received a fax from [employer] regarding an approval for ENBREL patient assistance from 09/05/20 to 09/02/21.   Will send approval letter to scan center.  Phone number: [phone]

## 2020-09-06 ENCOUNTER — Other Ambulatory Visit: Payer: Self-pay

## 2020-09-06 DIAGNOSIS — Z8619 Personal history of other infectious and parasitic diseases: Secondary | ICD-10-CM

## 2020-09-06 DIAGNOSIS — Z79899 Other long term (current) drug therapy: Secondary | ICD-10-CM

## 2020-09-06 NOTE — Telephone Encounter (Signed)
Ok to proceed with new start visit for initiating Enbrel on or after 09/15/20.

## 2020-09-06 NOTE — Telephone Encounter (Signed)
Per discussion with Sherron Ales, PA-C, patient can start Enbrel on or after 09/15/20.  Notified patient about needing baseline labs (CBC w/ diff, CMP w/ GFR, and Hep C quantitative) before starting Enbrel. Will schedule first dose visit once labs are received from PCP office.  Will be due for TB gold in April 2022. Hep C quantitative every 6 months. CBC/CMP after 1 month then every 3 months.  Chesley Mires, PharmD, MPH Clinical Pharmacist (Rheumatology and Pulmonology)

## 2020-09-07 ENCOUNTER — Telehealth: Payer: Self-pay

## 2020-09-07 NOTE — Telephone Encounter (Signed)
Patient called requesting her labwork orders be refaxed to her PCP Roxine Caddy at [phone].

## 2020-09-07 NOTE — Telephone Encounter (Signed)
Lab Orders refaxed

## 2020-09-12 DIAGNOSIS — E782 Mixed hyperlipidemia: Secondary | ICD-10-CM | POA: Diagnosis not present

## 2020-09-12 DIAGNOSIS — Z79899 Other long term (current) drug therapy: Secondary | ICD-10-CM | POA: Diagnosis not present

## 2020-09-12 DIAGNOSIS — R739 Hyperglycemia, unspecified: Secondary | ICD-10-CM | POA: Diagnosis not present

## 2020-09-12 DIAGNOSIS — E7849 Other hyperlipidemia: Secondary | ICD-10-CM | POA: Diagnosis not present

## 2020-09-12 DIAGNOSIS — J449 Chronic obstructive pulmonary disease, unspecified: Secondary | ICD-10-CM | POA: Diagnosis not present

## 2020-09-12 DIAGNOSIS — I10 Essential (primary) hypertension: Secondary | ICD-10-CM | POA: Diagnosis not present

## 2020-09-12 DIAGNOSIS — E119 Type 2 diabetes mellitus without complications: Secondary | ICD-10-CM | POA: Diagnosis not present

## 2020-09-14 ENCOUNTER — Telehealth: Payer: Self-pay | Admitting: *Deleted

## 2020-09-14 NOTE — Telephone Encounter (Signed)
09/22/2020 [facility], Triglycerides 218, HDL Chloesterol 38, Glucose 170, Hemoglobin A1C 7.0, Hematocrit 46.9, Eos Absolute .05, LFT's WNL reviewed by Sherron Ales, PA

## 2020-09-16 NOTE — Telephone Encounter (Signed)
Received CMP last results on 09/14/20. Awaiting CBC w/diff and Hep R RNA quant. Left VM at [facility] to have lab results faxed to [place] - including our clinic fax number and callback number. Will f/u. Need these baseline labs prior to starting Enbrel.  Chesley Mires, PharmD, MPH Clinical Pharmacist (Rheumatology and Pulmonology)

## 2020-09-21 DIAGNOSIS — Z79899 Other long term (current) drug therapy: Secondary | ICD-10-CM | POA: Diagnosis not present

## 2020-09-21 DIAGNOSIS — I8310 Varicose veins of unspecified lower extremity with inflammation: Secondary | ICD-10-CM | POA: Diagnosis not present

## 2020-09-21 DIAGNOSIS — M069 Rheumatoid arthritis, unspecified: Secondary | ICD-10-CM | POA: Diagnosis not present

## 2020-09-21 DIAGNOSIS — E119 Type 2 diabetes mellitus without complications: Secondary | ICD-10-CM | POA: Diagnosis not present

## 2020-09-21 DIAGNOSIS — J449 Chronic obstructive pulmonary disease, unspecified: Secondary | ICD-10-CM | POA: Diagnosis not present

## 2020-09-21 DIAGNOSIS — E782 Mixed hyperlipidemia: Secondary | ICD-10-CM | POA: Diagnosis not present

## 2020-09-21 DIAGNOSIS — I872 Venous insufficiency (chronic) (peripheral): Secondary | ICD-10-CM | POA: Diagnosis not present

## 2020-09-22 NOTE — Telephone Encounter (Signed)
Reached out to [facility] to receive CBC w/ diff results. They will fax over the results today. They were unable to draw Hep C RNA quantitative. Will have this Hep C lab drawn at her Enbrel new start visit.  CMP 09/12/20 was WNL. Once CBC results are received and if they're WNL, can proceed with scheduling Enbrel new start.  Chesley Mires, PharmD, MPH Clinical Pharmacist (Rheumatology and Pulmonology)

## 2020-09-23 NOTE — Telephone Encounter (Signed)
ATC patient to schedule new start. Patient reports that her husband has cardiac f/u that she is focused on currently. She would like to wait to schedule Enbrel new start until there is some resolution with her husband's care. She plans to call us back sometime next week to schedule new start.  She has reeived the Enbrel mini cartridges at home already but had not used. Does not have AutoTouch injector device. Advised that we will supply her the first one when she uses it in the clinic for her first dose. She verbalized understanding.  Will f/u if I don't hear from her by the end of next week.  Chesley Mires, PharmD, MPH Clinical Pharmacist (Rheumatology and Pulmonology)

## 2020-09-23 NOTE — Telephone Encounter (Signed)
Called [facility] to have labs results re-faxed to our clinic. Still awaiting CBC. Will f/u next week.

## 2020-09-30 ENCOUNTER — Other Ambulatory Visit: Payer: Self-pay

## 2020-09-30 NOTE — Telephone Encounter (Signed)
Patient scheduled her Enbrel new start appointment on Monday, 10/03/20 at 10:40 am with Ladona Ridgel.    Patient is requesting a prescription of Prednisone to be sent to [facility] at [address] in [place].  Patient states she is in a lot of pain and doesn't know how long it will be before the Enbrel helps her symptoms.

## 2020-09-30 NOTE — Telephone Encounter (Signed)
Ok to send in a prednisone taper starting at 20 mg tapering by 5 mg every 4 days.

## 2020-10-01 MED ORDER — PREDNISONE 5 MG PO TABS
ORAL_TABLET | ORAL | 0 refills | Status: DC
Start: 2020-10-01 — End: 2020-11-14

## 2020-10-03 ENCOUNTER — Other Ambulatory Visit: Payer: Self-pay

## 2020-10-03 ENCOUNTER — Ambulatory Visit: Payer: Medicare Other | Admitting: Pharmacist

## 2020-10-03 DIAGNOSIS — M0579 Rheumatoid arthritis with rheumatoid factor of multiple sites without organ or systems involvement: Secondary | ICD-10-CM

## 2020-10-03 DIAGNOSIS — Z8619 Personal history of other infectious and parasitic diseases: Secondary | ICD-10-CM

## 2020-10-03 DIAGNOSIS — Z79899 Other long term (current) drug therapy: Secondary | ICD-10-CM

## 2020-10-03 MED ORDER — ENBREL MINI 50 MG/ML ~~LOC~~ SOCT: 4.0000 mL | SUBCUTANEOUS | 2 refills | Status: DC

## 2020-10-03 MED ORDER — ENBREL MINI 50 MG/ML ~~LOC~~ SOCT: 1.0000 mL | SUBCUTANEOUS | 2 refills | Status: DC

## 2020-10-03 NOTE — Patient Instructions (Addendum)
Your Enbrel dose will: 50 mg (1 cartridge) every 7 days Next dose due: 10/10/20, 10/17/20, 10/24/20, 10/31/20, then every 7 days thereafter  Your Enbrel is mailed to you for free from [employer]'s patient assistance foundation. Call [phone] when you have 2 cartridges left so schedule your next shipment  Get your Hep C lab drawn at [facility] on [facility]. We will fax over the orders for it.  Remember the 5 C's:  COUNTER - leave on the counter at least 15-30 minutes but up to overnight to bring medication to room temperature. This may help prevent stinging  COLD - place something cold (like an ice gel pack or cold water bottle) on the injection site just before cleansing with alcohol. This may help reduce pain  CLARITIN - use Claritin (generic name is loratadine) for the first two weeks of treatment or the day of, the day before, and the day after injecting. This will help to minimize injection site reactions  CORTISONE CREAM - apply if injection site is irritated and itching  CALL ME - if injection site reaction is bigger than the size of your fist, looks infected, blisters, or if you develop hives  Standing Labs  Please have your standing labs drawn in 1 month then every 3 months. We can send the labs to your PCP's office but call our clinic ahead of time to do so.  If possible, please have your labs drawn 2 weeks prior to your appointment so that the provider can discuss your results at your appointment.  We have open lab daily Monday through Thursday from 8:30-12:30 PM and 1:30-4:30 PM and Friday from 8:30-12:30 PM and 1:30-4:00 PM at the office of Dr. Pollyann Savoy, [facility].   Please be advised, all patients with office appointments requiring lab work will take precedents over walk-in lab work.  If possible, please come for your lab work on Monday and Friday afternoons, as you may experience shorter wait times. The office is located at [address],  [place], [place] [address] No appointment is necessary.   Labs are drawn by [facility]. Please bring your co-pay at the time of your lab draw.  You may receive a bill from [facility] for your lab work.  If you wish to have your labs drawn at another location, please call the office 24 hours in advance to send orders.  If you have any questions regarding directions or hours of operation,  please call [phone].   As a reminder, please drink plenty of water prior to coming for your lab work. Thanks!

## 2020-10-03 NOTE — Progress Notes (Signed)
Pharmacy Note  Subjective:   Patient presents to clinic today to receive first dose of Enbrel Mini.  Patient running a fever or have signs/symptoms of infection? No  Patient currently on antibiotics for the treatment of infection? No  Patient have any upcoming invasive procedures/surgeries? No  Objective: CBC/CMP drawn 09/22/20 at [facility]. Triglycerides 218, HDL Chloesterol 38, Glucose 170, Hemoglobin A1C 7.0, Hematocrit 46.9, Eos Absolute .05, LFT's WNL reviewed by Sherron Ales, PA  Baseline Immunosuppressant Therapy Labs TB GOLD Quantiferon TB Gold Latest Ref Rng & Units 12/09/2019  Quantiferon TB Gold Plus NEGATIVE NEGATIVE   Hepatitis Panel: history of positive Hep C so she has Hep R RNA quantitative drawn every 6 months   HIV - negative (12/28/2015) Immunoglobulins - negative   SPEP Serum Protein Electrophoresis Latest Ref Rng & Units 12/09/2019  Total Protein 6.1 - 8.1 g/dL 6.2   Chest x-ray: 23/76/28 - wnl  Assessment/Plan:  Demonstrated proper injection technique with Enbrel Mini demo device  Patient able to demonstrate proper injection technique using the teach back method.  Patient self injected in the right thigh with:  Sample Medication: Sureclick Autoinjector NDC: [phone] Lot: [id number] Expiration: 12/01/21  Sample Medication: Enbrel Mini prefilled cartridge NDC: [address]-50 Lot: [id number] Expiration: 12/2022  Patient tolerated well.  Observed for 30 mins in office for adverse reaction.   Patient is to return in 1 month for labs and 6-8 weeks for follow-up appointment.  Patient has labs drawn at PCP, [facility]. Standing orders placed.  Advised her to call when she's planning to get labs and we will fax over.  Patent due for Hep C quantiative today and every 6 months.  Patient due for Hep C quantitative today and every 6 months - faxed to [phone].  Patient verbalized she will plan to get this lab drawn in the week or  so.  Enbrel approved through [employer] patient assistance.  Prescription for 84 days supply already sent to [employer] - she has received the medication from [employer] in the mail already but has not used it as of yet.  Patient will need dermatology referral and f/u appointment in 8 weeks with Dr. Corliss Skains.  All questions encouraged and answered.  Instructed patient to call with any further questions or concerns.  Chesley Mires, PharmD, MPH Clinical Pharmacist (Rheumatology and Pulmonology)  10/03/2020 11:04 AM

## 2020-10-20 NOTE — Progress Notes (Deleted)
Office Visit Note  Patient: Christina Jenkins             Date of Birth: 10/23/1960           MRN: [id number]             PCP: Encarnacion Slates, PA-C Referring: Encarnacion Slates, PA-C Visit Date: 11/03/2020 Occupation: @GUAROCC @  Subjective:  No chief complaint on file.   History of Present Illness: BRITHANY WHITWORTH is a 60 y.o. female ***   Activities of Daily Living:  Patient reports morning stiffness for *** {minute/hour:19697}.   Patient {ACTIONS;DENIES/REPORTS:21021675::"Denies"} nocturnal pain.  Difficulty dressing/grooming: {ACTIONS;DENIES/REPORTS:21021675::"Denies"} Difficulty climbing stairs: {ACTIONS;DENIES/REPORTS:21021675::"Denies"} Difficulty getting out of chair: {ACTIONS;DENIES/REPORTS:21021675::"Denies"} Difficulty using hands for taps, buttons, cutlery, and/or writing: {ACTIONS;DENIES/REPORTS:21021675::"Denies"}  No Rheumatology ROS completed.   PMFS History:  Patient Active Problem List   Diagnosis Date Noted  . Chest pain of uncertain etiology 04/30/2019  . Abnormal nuclear stress test   . History of COPD 05/31/2017  . Fatigue 12/04/2016  . History of seizures 10/23/2016  . History of hepatitis C 10/23/2016  . ANA positive 10/23/2016  . Sicca syndrome, unspecified 10/23/2016  . Seropositive rheumatoid arthritis (HCC) 08/01/2016  . High risk medication use 08/01/2016  . Hepatitis C 08/01/2016  . Tobacco abuse 08/01/2016  . Depression 08/01/2016  . Complex partial seizure (HCC) 02/02/2016  . MCI (mild cognitive impairment) 11/02/2015  . Arthralgia 11/02/2015  . Carpal tunnel syndrome 10/20/2015  . Syncope 05/17/2015  . Convulsions/seizures (HCC) 05/17/2015    Past Medical History:  Diagnosis Date  . Arthritis 02/01/16   dx w/rheumatoid arthritis  . Carpal tunnel syndrome 10/20/2015   right  . Carpal tunnel syndrome on both sides   . Dysuria   . Hypokalemia   . Liver disease    hepatitis c  . Seasonal allergies   . Seizures (HCC)    last 05/05/15     Family History  Problem Relation Age of Onset  . Pancreatic cancer Mother   . COPD Father   . Diabetes Brother    Past Surgical History:  Procedure Laterality Date  . LEFT HEART CATH AND CORONARY ANGIOGRAPHY N/A 04/30/2019   Procedure: LEFT HEART CATH AND CORONARY ANGIOGRAPHY;  Surgeon: 05/02/2019, MD;  Location: [facility];  Service: Cardiovascular;  Laterality: N/A;  . TONSILLECTOMY     Social History   Social History Narrative  . Not on file   Immunization History  Administered Date(s) Administered  . Influenza,inj,Quad PF,6+ Mos 05/16/2017, 05/15/2018  . Moderna Sars-Covid-2 Vaccination 11/16/2019, 12/14/2019  . Tdap 06/26/2018  . Zoster Recombinat (Shingrix) 09/04/2017     Objective: Vital Signs: There were no vitals taken for this visit.   Physical Exam   Musculoskeletal Exam: ***  CDAI Exam: CDAI Score: -- Patient Global: --; Provider Global: -- Swollen: --; Tender: -- Joint Exam 11/03/2020   No joint exam has been documented for this visit   There is currently no information documented on the homunculus. Go to the Rheumatology activity and complete the homunculus joint exam.  Investigation: No additional findings.  Imaging: No results found.  Recent Labs: Lab Results  Component Value Date   WBC 8.5 12/09/2019   HGB 15.3 12/09/2019   PLT 201 12/09/2019   NA 143 12/09/2019   K 3.7 12/09/2019   CL 104 12/09/2019   CO2 30 12/09/2019   GLUCOSE 93 12/09/2019   BUN 11 12/09/2019   CREATININE 0.82 12/09/2019   BILITOT  0.4 12/09/2019   ALKPHOS 69 04/29/2017   AST 22 12/09/2019   ALT 29 12/09/2019   PROT 6.2 12/09/2019   ALBUMIN 4.2 04/29/2017   CALCIUM 9.4 12/09/2019   GFRAA 91 12/09/2019   QFTBGOLDPLUS NEGATIVE 12/09/2019    Speciality Comments: Need Hep C quant every 6 months Prior therapy: methotrexate (oral ulcers) and Cimzia (inadequate response)   Procedures:  No procedures performed Allergies: Patient has no known  allergies.   Assessment / Plan:     Visit Diagnoses: No diagnosis found.  Orders: No orders of the defined types were placed in this encounter.  No orders of the defined types were placed in this encounter.   Face-to-face time spent with patient was *** minutes. Greater than 50% of time was spent in counseling and coordination of care.  Follow-Up Instructions: No follow-ups on file.   Ellen Henri, CMA  Note - This record has been created using Animal nutritionist.  Chart creation errors have been sought, but may not always  have been located. Such creation errors do not reflect on  the standard of medical care.

## 2020-10-31 ENCOUNTER — Other Ambulatory Visit: Payer: Self-pay | Admitting: Rheumatology

## 2020-11-03 ENCOUNTER — Ambulatory Visit: Payer: Medicare Other | Admitting: Rheumatology

## 2020-11-03 DIAGNOSIS — Z87898 Personal history of other specified conditions: Secondary | ICD-10-CM

## 2020-11-03 DIAGNOSIS — M7061 Trochanteric bursitis, right hip: Secondary | ICD-10-CM

## 2020-11-03 DIAGNOSIS — M0579 Rheumatoid arthritis with rheumatoid factor of multiple sites without organ or systems involvement: Secondary | ICD-10-CM

## 2020-11-03 DIAGNOSIS — Z87891 Personal history of nicotine dependence: Secondary | ICD-10-CM

## 2020-11-03 DIAGNOSIS — M19041 Primary osteoarthritis, right hand: Secondary | ICD-10-CM

## 2020-11-03 DIAGNOSIS — Z8709 Personal history of other diseases of the respiratory system: Secondary | ICD-10-CM

## 2020-11-03 DIAGNOSIS — Z79899 Other long term (current) drug therapy: Secondary | ICD-10-CM

## 2020-11-03 DIAGNOSIS — R768 Other specified abnormal immunological findings in serum: Secondary | ICD-10-CM

## 2020-11-03 DIAGNOSIS — M19071 Primary osteoarthritis, right ankle and foot: Secondary | ICD-10-CM

## 2020-11-03 DIAGNOSIS — G8929 Other chronic pain: Secondary | ICD-10-CM

## 2020-11-03 DIAGNOSIS — Z8619 Personal history of other infectious and parasitic diseases: Secondary | ICD-10-CM

## 2020-11-10 NOTE — Progress Notes (Signed)
Office Visit Note  Patient: Christina Jenkins             Date of Birth: 1961/05/27           MRN: [id number]             PCP: Encarnacion Slates, PA-C Referring: Encarnacion Slates, PA-C Visit Date: 11/23/2020 Occupation: @GUAROCC @  Subjective:  Medication monitoring   History of Present Illness: Christina Jenkins is a 60 y.o. female with history of seropositive rheumatoid arthritis and osteoarthritis.  Patient was started on Enbrel on 10/03/2020 and has continued to take Arava 10 mg 1 tablet alternating with 2 tablets every other day.  Patient reports that she has had 7 Enbrel injections and has been tolerating it without any side effects or injection site reactions.  On 11/14/2020 the patient was experiencing bilateral hand pain and swelling.  She was provided a prednisone taper starting at 20 mg tapering by 5 mg every 2 days which resolved her symptoms.  Overall she feels as though the combination therapy has started to control her rheumatoid arthritis.  She states that her shoulder joint pain has improved.  She denies any trochanteric bursitis at this time.  She is not experiencing any groin pain currently.  She denies any pain in her ankle joints or feet at this time. She denies any recent infections.       Activities of Daily Living:  Patient reports morning stiffness for 1 hour.   Patient Denies nocturnal pain.  Difficulty dressing/grooming: Denies Difficulty climbing stairs: Denies Difficulty getting out of chair: Denies Difficulty using hands for taps, buttons, cutlery, and/or writing: Reports  Review of Systems  Constitutional: Negative for fatigue.  HENT: Negative for mouth sores, mouth dryness and nose dryness.   Eyes: Positive for itching. Negative for pain and dryness.  Respiratory: Negative for shortness of breath and difficulty breathing.   Cardiovascular: Negative for chest pain and palpitations.  Gastrointestinal: Negative for blood in stool, constipation and diarrhea.  Endocrine:  Negative for increased urination.  Genitourinary: Negative for difficulty urinating.  Musculoskeletal: Positive for arthralgias, joint pain, joint swelling and morning stiffness. Negative for myalgias, muscle tenderness and myalgias.  Skin: Negative for color change, rash and redness.  Allergic/Immunologic: Negative for susceptible to infections.  Neurological: Positive for parasthesias. Negative for dizziness, numbness, headaches, memory loss and weakness.  Hematological: Negative for bruising/bleeding tendency.  Psychiatric/Behavioral: Negative for confusion.    PMFS History:  Patient Active Problem List   Diagnosis Date Noted  . Chest pain of uncertain etiology 04/30/2019  . Abnormal nuclear stress test   . History of COPD 05/31/2017  . Fatigue 12/04/2016  . History of seizures 10/23/2016  . History of hepatitis C 10/23/2016  . ANA positive 10/23/2016  . Sicca syndrome, unspecified 10/23/2016  . Seropositive rheumatoid arthritis (HCC) 08/01/2016  . High risk medication use 08/01/2016  . Hepatitis C 08/01/2016  . Tobacco abuse 08/01/2016  . Depression 08/01/2016  . Complex partial seizure (HCC) 02/02/2016  . MCI (mild cognitive impairment) 11/02/2015  . Arthralgia 11/02/2015  . Carpal tunnel syndrome 10/20/2015  . Syncope 05/17/2015  . Convulsions/seizures (HCC) 05/17/2015    Past Medical History:  Diagnosis Date  . Arthritis 02/01/16   dx w/rheumatoid arthritis  . Carpal tunnel syndrome 10/20/2015   right  . Carpal tunnel syndrome on both sides   . Dysuria   . Hypokalemia   . Liver disease    hepatitis c  . Seasonal allergies   .  Seizures (HCC)    last 05/05/15    Family History  Problem Relation Age of Onset  . Pancreatic cancer Mother   . COPD Father   . Diabetes Brother    Past Surgical History:  Procedure Laterality Date  . LEFT HEART CATH AND CORONARY ANGIOGRAPHY N/A 04/30/2019   Procedure: LEFT HEART CATH AND CORONARY ANGIOGRAPHY;  Surgeon: Runell Gess, MD;  Location: [facility];  Service: Cardiovascular;  Laterality: N/A;  . TONSILLECTOMY     Social History   Social History Narrative  . Not on file   Immunization History  Administered Date(s) Administered  . Influenza,inj,Quad PF,6+ Mos 05/16/2017, 05/15/2018  . Moderna Sars-Covid-2 Vaccination 11/16/2019, 12/14/2019, 08/11/2020  . Tdap 06/26/2018  . Zoster Recombinat (Shingrix) 09/04/2017     Objective: Vital Signs: BP 139/84 (BP Location: Left Arm, Patient Position: Sitting, Cuff Size: Normal)   Pulse 86   Resp 16   Ht 5\' 6"  (1.676 m)   Wt 232 lb (105.2 kg)   BMI 37.45 kg/m    Physical Exam Vitals and nursing note reviewed.  Constitutional:      Appearance: She is well-developed.  HENT:     Head: Normocephalic and atraumatic.  Eyes:     Conjunctiva/sclera: Conjunctivae normal.  Pulmonary:     Effort: Pulmonary effort is normal.  Abdominal:     Palpations: Abdomen is soft.  Musculoskeletal:     Cervical back: Normal range of motion.  Skin:    General: Skin is warm and dry.     Capillary Refill: Capillary refill takes less than 2 seconds.  Neurological:     Mental Status: She is alert and oriented to person, place, and time.  Psychiatric:        Behavior: Behavior normal.      Musculoskeletal Exam: C-spine, thoracic spine, and lumbar spine good ROM.  Shoulder joints, elbow joints, wrist joints, MCPs, PIPs, and DIPs good ROM with no synovitis.  Synovial thickening of bilateral 2nd MCP joints.  PIP and DIP thickening consistent with osteoarthritis of both hands.  Hip joints good ROM with no discomfort.  No tenderness over trochanter bursa bilaterally.  Knee joints good ROM with no warmth or effusion.  Ankle joints good ROM with no tenderness or swelling.   CDAI Exam: CDAI Score: 0.6  Patient Global: 3 mm; Provider Global: 3 mm Swollen: 0 ; Tender: 0  Joint Exam 11/23/2020   No joint exam has been documented for this visit   There is currently no  information documented on the homunculus. Go to the Rheumatology activity and complete the homunculus joint exam.  Investigation: No additional findings.  Imaging: No results found.  Recent Labs: Lab Results  Component Value Date   WBC 8.5 12/09/2019   HGB 15.3 12/09/2019   PLT 201 12/09/2019   NA 143 12/09/2019   K 3.7 12/09/2019   CL 104 12/09/2019   CO2 30 12/09/2019   GLUCOSE 93 12/09/2019   BUN 11 12/09/2019   CREATININE 0.82 12/09/2019   BILITOT 0.4 12/09/2019   ALKPHOS 69 04/29/2017   AST 22 12/09/2019   ALT 29 12/09/2019   PROT 6.2 12/09/2019   ALBUMIN 4.2 04/29/2017   CALCIUM 9.4 12/09/2019   GFRAA 91 12/09/2019   QFTBGOLDPLUS NEGATIVE 12/09/2019    Speciality Comments: Need Hep C quant every 6 months Prior therapy: methotrexate (oral ulcers) and Cimzia (inadequate response)   Procedures:  No procedures performed Allergies: Patient has no known allergies.  Assessment / Plan:     Visit Diagnoses: Rheumatoid arthritis with rheumatoid factor of multiple sites without organ or systems involvement (HCC) - Prior therapy: Actemra- inadequate response, methotrexate (oral ulcers) and Cimzia (inadequate response): She has no joint tenderness or synovitis on exam.  She was experiencing pain and swelling in bilateral hands on 11/14/2020 and was sent in a prednisone taper starting at 20 mg tapering by 5 mg every 2 days which resolved her symptoms.  She is currently on Enbrel 50 mg subcutaneous injections once weekly and Arava 10 mg alternating with 20 mg every other day.  She has had 7 doses of Enbrel (started on 10/03/20) and has started to notice some improvement on combination therapy.  She will continue on the current treatment regimen.  She was advised to notify us if she develops increased joint pain or joint swelling. She will follow up in 3 months to assess her full response to combination therapy.   High risk medication use - Enbrel 50 mg subcutaneous injections once  weekly (started 10/03/20) and Arava 10 mg alternating with 20 mg every other day.  She is on the reduced dose of arava due to history of elevated LFTs.  TB gold negative on 12/09/2019.  She is due to update CBC and CMP.  She will be having lab work drawn on 12/02/2020 at [facility].  She was given lab orders today to take with her to her upcoming appointment.  Her next lab work will be due in July and every 3 months to monitor for drug toxicity.  Standing orders for CBC and CMP remain in place. She has not had any recent infections.  We discussed the importance of holding Arava and Enbrel if she develops signs or symptoms of an infection and to resume once the infection has completely cleared.  She voiced understanding. She has received 3 Moderna covid-19 vaccine doses.  Encouraged to receive the 4th dose once she is eligible.   Chronic left shoulder pain: She has good range of motion of the left shoulder joint on exam with no discomfort.  No tenderness to palpation noted.  Primary osteoarthritis of both hands: She has PIP and DIP thickening consistent with osteoarthritis of both hands.  No joint tenderness or inflammation was noted.  She was able to make a complete fist bilaterally.  Joint protection and muscle strengthening were discussed.  Primary osteoarthritis of both feet: She experiences intermittent pain in both feet.  She has not currently experiencing any discomfort in her ankle joints.  She wears proper fitting shoes.   ANA positive: She has no clinical features of systemic lupus.  Trochanteric bursitis of both hips: She has intermittent symptoms of trochanter bursitis of both the left greater than right.  Experiencing any discomfort.  She has no tenderness palpation on exam.  We discussed the importance of performing stretching exercises on a daily basis.  Other medical conditions are listed as follows:  Former smoker  History of COPD  History of seizures  History of hepatitis C - HCV  quant negative on 04/07/19.   Orders: No orders of the defined types were placed in this encounter.  No orders of the defined types were placed in this encounter.     Follow-Up Instructions: Return in about 3 months (around 02/23/2021) for Rheumatoid arthritis.   Gearldine Bienenstock, PA-C  Note - This record has been created using Dragon software.  Chart creation errors have been sought, but may not always  have been located.  Such creation errors do not reflect on  the standard of medical care.

## 2020-11-14 ENCOUNTER — Other Ambulatory Visit: Payer: Self-pay

## 2020-11-14 MED ORDER — LEFLUNOMIDE 10 MG PO TABS: ORAL_TABLET | ORAL | 0 refills | Status: DC

## 2020-11-14 MED ORDER — PREDNISONE 5 MG PO TABS: ORAL_TABLET | ORAL | 0 refills | Status: DC

## 2020-11-14 NOTE — Telephone Encounter (Signed)
Patient is having bil hand pain and swelling, patient requesting RF of prednisone taper, [facility], please advise.

## 2020-11-14 NOTE — Addendum Note (Signed)
Addended by: Audrie Lia on: 11/14/2020 03:05 PM   Modules accepted: Orders

## 2020-11-14 NOTE — Telephone Encounter (Signed)
Please clarify if she has had any gaps in therapy since starting on Enbrel.  Ok to refill Arava.   Ok to send in short prednisone taper starting at 20 mg tapering by 5 mg every 2 days.

## 2020-11-14 NOTE — Telephone Encounter (Signed)
Patient has had no gaps in therapy on Enbrel.

## 2020-11-14 NOTE — Telephone Encounter (Signed)
Next Visit: 11/23/2020  Last Visit: 07/01/2020  Last Fill: 08/24/2020  Labs: 09/22/2020 [facility], Triglycerides 218, HDL Chloesterol 38, Glucose 170, Hemoglobin A1C 7.0, Hematocrit 46.9, Eos Absolute .05, LFT's WNL reviewed by Sherron Ales, PA  Dx: Rheumatoid arthritis with rheumatoid factor of multiple sites without organ or systems involvement   Current Dose per office note on 07/01/2020, She was advised to reduce the dose of Arava to 10 mg alternating with 20 mg every other day.   Okay to refill Leflunomide?

## 2020-11-14 NOTE — Telephone Encounter (Signed)
Patient called requesting prescription refill of Leflunomide 10 mg and Prednisone 5 mg to be sent to [facility] at [address]

## 2020-11-23 ENCOUNTER — Encounter: Payer: Self-pay | Admitting: Physician Assistant

## 2020-11-23 ENCOUNTER — Ambulatory Visit (INDEPENDENT_AMBULATORY_CARE_PROVIDER_SITE_OTHER): Payer: Medicare Other | Admitting: Physician Assistant

## 2020-11-23 ENCOUNTER — Other Ambulatory Visit: Payer: Self-pay

## 2020-11-23 VITALS — BP 139/84 | HR 86 | Resp 16 | Ht 66.0 in | Wt 232.0 lb

## 2020-11-23 DIAGNOSIS — R768 Other specified abnormal immunological findings in serum: Secondary | ICD-10-CM

## 2020-11-23 DIAGNOSIS — Z79899 Other long term (current) drug therapy: Secondary | ICD-10-CM

## 2020-11-23 DIAGNOSIS — M19071 Primary osteoarthritis, right ankle and foot: Secondary | ICD-10-CM

## 2020-11-23 DIAGNOSIS — G8929 Other chronic pain: Secondary | ICD-10-CM

## 2020-11-23 DIAGNOSIS — M19072 Primary osteoarthritis, left ankle and foot: Secondary | ICD-10-CM

## 2020-11-23 DIAGNOSIS — M0579 Rheumatoid arthritis with rheumatoid factor of multiple sites without organ or systems involvement: Secondary | ICD-10-CM | POA: Diagnosis not present

## 2020-11-23 DIAGNOSIS — M7062 Trochanteric bursitis, left hip: Secondary | ICD-10-CM

## 2020-11-23 DIAGNOSIS — Z8709 Personal history of other diseases of the respiratory system: Secondary | ICD-10-CM

## 2020-11-23 DIAGNOSIS — Z8619 Personal history of other infectious and parasitic diseases: Secondary | ICD-10-CM

## 2020-11-23 DIAGNOSIS — M25512 Pain in left shoulder: Secondary | ICD-10-CM | POA: Diagnosis not present

## 2020-11-23 DIAGNOSIS — Z87898 Personal history of other specified conditions: Secondary | ICD-10-CM

## 2020-11-23 DIAGNOSIS — M19041 Primary osteoarthritis, right hand: Secondary | ICD-10-CM

## 2020-11-23 DIAGNOSIS — Z87891 Personal history of nicotine dependence: Secondary | ICD-10-CM

## 2020-11-23 DIAGNOSIS — M7061 Trochanteric bursitis, right hip: Secondary | ICD-10-CM

## 2020-11-23 DIAGNOSIS — M19042 Primary osteoarthritis, left hand: Secondary | ICD-10-CM

## 2020-12-12 ENCOUNTER — Telehealth: Payer: Self-pay | Admitting: Rheumatology

## 2020-12-12 DIAGNOSIS — M0579 Rheumatoid arthritis with rheumatoid factor of multiple sites without organ or systems involvement: Secondary | ICD-10-CM

## 2020-12-12 MED ORDER — ENBREL MINI 50 MG/ML ~~LOC~~ SOCT: 1.0000 mL | SUBCUTANEOUS | 2 refills | Status: DC

## 2020-12-12 NOTE — Telephone Encounter (Signed)
Please advise the patient to update CBC, CMP, and TB gold this month.

## 2020-12-12 NOTE — Telephone Encounter (Signed)
Next Visit: 03/01/2021  Last Visit: 11/23/2020  Last Fill: 10/03/2020  DX:  Rheumatoid arthritis with rheumatoid factor of multiple sites without organ or systems involvement   Current Dose per office note 11/23/2020, Enbrel 50 mg subcutaneous injections once weekly   09/22/2020 [facility], Triglycerides 218, HDL Chloesterol 38, Glucose 170, Hemoglobin A1C 7.0, Hematocrit 46.9, Eos Absolute .05, LFT's WNL reviewed by Sherron Ales, PA   TB Gold: 12/09/2019, negative  Okay to refill Enbrel?

## 2020-12-12 NOTE — Telephone Encounter (Signed)
LMOM for patient to call, Labs received from [facility] did not include labs needed

## 2020-12-12 NOTE — Telephone Encounter (Signed)
Patient request refill on Enbrel sent to mail order pharmacy.

## 2020-12-13 ENCOUNTER — Telehealth: Payer: Self-pay | Admitting: *Deleted

## 2020-12-13 NOTE — Telephone Encounter (Signed)
Labs received from: [facility] Drawn on: 12/07/2020 Reviewed by: Sherron Ales, PA-C  Labs drawn:CMET, Lipid Panel, Creatinine Ratio  Results: Glucose  140 Triglycerides   186 HDL Cholesterol  32 A1C    7.1 Alb/Creatinine Ratio` 1523

## 2020-12-20 ENCOUNTER — Other Ambulatory Visit: Payer: Self-pay | Admitting: *Deleted

## 2020-12-20 ENCOUNTER — Telehealth: Payer: Self-pay | Admitting: *Deleted

## 2020-12-20 NOTE — Telephone Encounter (Signed)
Labs received from: [facility] Drawn on: 12/14/2020 Reviewed by: Hazel Sams, PA-C  Labs drawn: CBC, C Met,   Results: TB Gold negative Glucose  144 BUN   11 A1G Ratio  2.3

## 2020-12-22 ENCOUNTER — Other Ambulatory Visit: Payer: Self-pay | Admitting: Rheumatology

## 2020-12-22 DIAGNOSIS — M0579 Rheumatoid arthritis with rheumatoid factor of multiple sites without organ or systems involvement: Secondary | ICD-10-CM

## 2020-12-22 MED ORDER — PREDNISONE 5 MG PO TABS: ORAL_TABLET | ORAL | 0 refills | Status: DC

## 2020-12-22 MED ORDER — ENBREL MINI 50 MG/ML ~~LOC~~ SOCT
1.0000 mL | SUBCUTANEOUS | 2 refills | Status: DC
Start: 2020-12-22 — End: 2021-03-14

## 2020-12-22 NOTE — Telephone Encounter (Signed)
Patient is experiencing pain and swelling in right ankle. ROM is limited due to the stiffness. Patient denies injury or fall, she states she went to bed feeling fine and woke up with ankle pain/swelling. Patient has not missed any doses of arava or enbrel. patient was last seen on 11/23/2020 and has an upcoming appointment on 03/01/2021. Patient is requesting a prednisone taper. Please advise.

## 2020-12-22 NOTE — Telephone Encounter (Signed)
She received a prednisone taper for a flare on 11/14/20.  Upon review she receives a taper about once per month.  If she is still having flares on a monthly basis she should schedule an appointment to discuss other treatment options.  Please explain the risks of long term prednisone use.  Ok to send in a short prednisone taper starting at 20 mg tapering by 5 mg every 2 days.

## 2020-12-22 NOTE — Telephone Encounter (Signed)
Advised patient that She received a prednisone taper for a flare on 11/14/20.  Upon review she receives a taper about once per month.  If she is still having flares on a monthly basis she should schedule an appointment to discuss other treatment options.  explained the  the risks of long term prednisone use.  Advised patient prednisone taper will be sent into the pharmacy. patient verbalized understanding.   Patient states she called [facility] to schedule shipment of enbrel and was advised that they did not receive the prescription. The prescription was sent to [facility] on 12/12/2020. ([employer] still uses [facility] for enbrel).   Please resend Enbrel prescription to [facility] and review prednisone taper and send to the local pharmacy. Thanks!

## 2020-12-22 NOTE — Telephone Encounter (Signed)
Patient requesting a rx for Prednisone to be called in. Patient having pain with right ankle. Patient woke up with pain, and swelling in ankle. Patient unable to weight bear. Patient did not hurt ankle in any way that she knows of. Patient uses [facility] in [place]. Please call to advise.

## 2021-02-13 ENCOUNTER — Other Ambulatory Visit: Payer: Self-pay

## 2021-02-13 MED ORDER — LEFLUNOMIDE 10 MG PO TABS: ORAL_TABLET | ORAL | 0 refills | Status: DC

## 2021-02-13 NOTE — Telephone Encounter (Signed)
Next Visit: 03/01/2021  Last Visit: 11/23/2020  Last Fill: 11/14/2020  DX: Rheumatoid arthritis with rheumatoid factor of multiple sites without organ or systems involvement  Current Dose per office note 11/23/2020: Arava 10 mg alternating with 20 mg every other day  Labs: 12/14/2020 Glucose  144 BUN  11 A1G Ratio       2.3   Okay to refill Arava?

## 2021-02-15 NOTE — Progress Notes (Deleted)
Office Visit Note  Patient: Christina Jenkins             Date of Birth: Feb 04, 1961           MRN: [id number]             PCP: Encarnacion Slates, PA-C Referring: Encarnacion Slates, PA-C Visit Date: 03/01/2021 Occupation: @GUAROCC @  Subjective:  No chief complaint on file.   History of Present Illness: Christina Jenkins is a 60 y.o. female ***   Activities of Daily Living:  Patient reports morning stiffness for *** {minute/hour:19697}.   Patient {ACTIONS;DENIES/REPORTS:21021675::"Denies"} nocturnal pain.  Difficulty dressing/grooming: {ACTIONS;DENIES/REPORTS:21021675::"Denies"} Difficulty climbing stairs: {ACTIONS;DENIES/REPORTS:21021675::"Denies"} Difficulty getting out of chair: {ACTIONS;DENIES/REPORTS:21021675::"Denies"} Difficulty using hands for taps, buttons, cutlery, and/or writing: {ACTIONS;DENIES/REPORTS:21021675::"Denies"}  No Rheumatology ROS completed.   PMFS History:  Patient Active Problem List   Diagnosis Date Noted   Chest pain of uncertain etiology 04/30/2019   Abnormal nuclear stress test    History of COPD 05/31/2017   Fatigue 12/04/2016   History of seizures 10/23/2016   History of hepatitis C 10/23/2016   ANA positive 10/23/2016   Sicca syndrome, unspecified 10/23/2016   Seropositive rheumatoid arthritis (HCC) 08/01/2016   High risk medication use 08/01/2016   Hepatitis C 08/01/2016   Tobacco abuse 08/01/2016   Depression 08/01/2016   Complex partial seizure (HCC) 02/02/2016   MCI (mild cognitive impairment) 11/02/2015   Arthralgia 11/02/2015   Carpal tunnel syndrome 10/20/2015   Syncope 05/17/2015   Convulsions/seizures (HCC) 05/17/2015    Past Medical History:  Diagnosis Date   Arthritis 02/01/16   dx w/rheumatoid arthritis   Carpal tunnel syndrome 10/20/2015   right   Carpal tunnel syndrome on both sides    Dysuria    Hypokalemia    Liver disease    hepatitis c   Seasonal allergies    Seizures (HCC)    last 05/05/15    Family History  Problem  Relation Age of Onset   Pancreatic cancer Mother    COPD Father    Diabetes Brother    Past Surgical History:  Procedure Laterality Date   LEFT HEART CATH AND CORONARY ANGIOGRAPHY N/A 04/30/2019   Procedure: LEFT HEART CATH AND CORONARY ANGIOGRAPHY;  Surgeon: 05/02/2019, MD;  Location: [facility];  Service: Cardiovascular;  Laterality: N/A;   TONSILLECTOMY     Social History   Social History Narrative   Not on file   Immunization History  Administered Date(s) Administered   Influenza,inj,Quad PF,6+ Mos 05/16/2017, 05/15/2018   Moderna Sars-Covid-2 Vaccination 11/16/2019, 12/14/2019, 08/11/2020   Tdap 06/26/2018   Zoster Recombinat (Shingrix) 09/04/2017     Objective: Vital Signs: There were no vitals taken for this visit.   Physical Exam   Musculoskeletal Exam:   CDAI Exam: CDAI Score: -- Patient Global: --; Provider Global: -- Swollen: --; Tender: -- Joint Exam 03/01/2021   No joint exam has been documented for this visit   There is currently no information documented on the homunculus. Go to the Rheumatology activity and complete the homunculus joint exam.  Investigation: No additional findings.  Imaging: No results found.  Recent Labs: Lab Results  Component Value Date   WBC 8.5 12/09/2019   HGB 15.3 12/09/2019   PLT 201 12/09/2019   NA 143 12/09/2019   K 3.7 12/09/2019   CL 104 12/09/2019   CO2 30 12/09/2019   GLUCOSE 93 12/09/2019   BUN 11 12/09/2019   CREATININE 0.82 12/09/2019  BILITOT 0.4 12/09/2019   ALKPHOS 69 04/29/2017   AST 22 12/09/2019   ALT 29 12/09/2019   PROT 6.2 12/09/2019   ALBUMIN 4.2 04/29/2017   CALCIUM 9.4 12/09/2019   GFRAA 91 12/09/2019   QFTBGOLDPLUS NEGATIVE 12/09/2019    Speciality Comments: Need Hep C quant every 6 months Prior therapy: methotrexate (oral ulcers) and Cimzia (inadequate response)   Procedures:  No procedures performed Allergies: Patient has no known allergies.   Assessment /  Plan:     Visit Diagnoses: No diagnosis found.  Orders: No orders of the defined types were placed in this encounter.  No orders of the defined types were placed in this encounter.   Face-to-face time spent with patient was *** minutes. Greater than 50% of time was spent in counseling and coordination of care.  Follow-Up Instructions: No follow-ups on file.   Ellen Henri, CMA  Note - This record has been created using Animal nutritionist.  Chart creation errors have been sought, but may not always  have been located. Such creation errors do not reflect on  the standard of medical care.

## 2021-02-24 ENCOUNTER — Other Ambulatory Visit: Payer: Self-pay

## 2021-02-24 MED ORDER — PREDNISONE 5 MG PO TABS: ORAL_TABLET | ORAL | 0 refills | Status: DC

## 2021-02-24 NOTE — Telephone Encounter (Signed)
Patient called stating she is experiencing right hip pain and requesting prescription of Prednisone be sent to [facility] in [place].  Patient states she has appointment scheduled for 03/01/21 with Dr. Corliss Skains.

## 2021-02-24 NOTE — Telephone Encounter (Signed)
Patient advised a prescription for prednisone is being sent to the pharmacy. Patient advised to continue Arava and Enbrel as prescribed.

## 2021-02-24 NOTE — Telephone Encounter (Signed)
Patient states she is having pain in her right hip. Unable to walk and unable to get comfortable to sleep. Patient states she was unable to sleep last night. Patient states she is on Enbrel. Patient states she has not missed any doses. Patient is also on Arava and has not missed any doses. Patient states she does not recall any injury. Patient states she has a follow up 03/01/2021. Patient is requesting a prescription for Prednisone. Please advise.

## 2021-02-24 NOTE — Telephone Encounter (Signed)
Ok to pend prednisone 20 mg tapering by 5 mg every 2 days.  Continue on enbrel and arava as prescribed.

## 2021-03-01 ENCOUNTER — Ambulatory Visit: Payer: Medicare Other | Admitting: Rheumatology

## 2021-03-07 NOTE — Progress Notes (Signed)
Office Visit Note  Patient: Christina Jenkins             Date of Birth: Jan 09, 1961           MRN: [id number]             PCP: Encarnacion Slates, PA-C Referring: Encarnacion Slates, PA-C Visit Date: 03/21/2021 Occupation: @GUAROCC @  Subjective:  Pain in multiple joints   History of Present Illness: Christina Jenkins is a 60 y.o. female with history of seropositive rheumatoid arthritis and osteoarthritis.  She is on Enbrel 50 mg subcutaneous injections once weekly and Arava 10 mg alternating with 20 mg every other day.  She is tolerating both medications without any side effects and has not had any injection site reactions from Enbrel.  Patient reports that her last flare was about 2 months ago in her right hip.  She states 2 months prior to that she had pain and swelling in her right ankle joint.  She was having difficulty walking during that time.  Both of these flares required prednisone tapers.  She presents today and is not experiencing any increased joint pain or joint swelling.  She denies any nocturnal pain or difficulty with ADLs.  She does not want to make any medication changes at this time.  She continues to work on weight loss and has been focusing on portion control. She denies any recent infections.  She is considering getting the second COVID-19 vaccine booster soon.     Activities of Daily Living:  Patient reports morning stiffness for 30 minutes.   Patient Denies nocturnal pain.  Difficulty dressing/grooming: Denies Difficulty climbing stairs: Denies Difficulty getting out of chair: Denies Difficulty using hands for taps, buttons, cutlery, and/or writing: Reports  Review of Systems  Constitutional:  Negative for fatigue.  HENT:  Negative for mouth sores, mouth dryness and nose dryness.   Eyes:  Negative for pain, itching and dryness.  Respiratory:  Negative for shortness of breath and difficulty breathing.   Cardiovascular:  Negative for chest pain and palpitations.   Gastrointestinal:  Negative for blood in stool, constipation and diarrhea.  Endocrine: Negative for increased urination.  Genitourinary:  Negative for difficulty urinating.  Musculoskeletal:  Positive for joint pain, joint pain, joint swelling and morning stiffness. Negative for myalgias, muscle tenderness and myalgias.  Skin:  Negative for color change, rash and redness.  Allergic/Immunologic: Negative for susceptible to infections.  Neurological:  Negative for dizziness, numbness, headaches, memory loss and weakness.  Hematological:  Negative for bruising/bleeding tendency.  Psychiatric/Behavioral:  Negative for confusion.    PMFS History:  Patient Active Problem List   Diagnosis Date Noted   Chest pain of uncertain etiology 04/30/2019   Abnormal nuclear stress test    History of COPD 05/31/2017   Fatigue 12/04/2016   History of seizures 10/23/2016   History of hepatitis C 10/23/2016   ANA positive 10/23/2016   Sicca syndrome, unspecified 10/23/2016   Seropositive rheumatoid arthritis (HCC) 08/01/2016   High risk medication use 08/01/2016   Hepatitis C 08/01/2016   Tobacco abuse 08/01/2016   Depression 08/01/2016   Complex partial seizure (HCC) 02/02/2016   MCI (mild cognitive impairment) 11/02/2015   Arthralgia 11/02/2015   Carpal tunnel syndrome 10/20/2015   Syncope 05/17/2015   Convulsions/seizures (HCC) 05/17/2015    Past Medical History:  Diagnosis Date   Arthritis 02/01/16   dx w/rheumatoid arthritis   Carpal tunnel syndrome 10/20/2015   right   Carpal tunnel syndrome on  both sides    Dysuria    Hypokalemia    Liver disease    hepatitis c   Seasonal allergies    Seizures (HCC)    last 05/05/15    Family History  Problem Relation Age of Onset   Pancreatic cancer Mother    COPD Father    Diabetes Brother    Past Surgical History:  Procedure Laterality Date   LEFT HEART CATH AND CORONARY ANGIOGRAPHY N/A 04/30/2019   Procedure: LEFT HEART CATH AND CORONARY  ANGIOGRAPHY;  Surgeon: Runell Gess, MD;  Location: [facility];  Service: Cardiovascular;  Laterality: N/A;   TONSILLECTOMY     Social History   Social History Narrative   Not on file   Immunization History  Administered Date(s) Administered   Influenza,inj,Quad PF,6+ Mos 05/16/2017, 05/15/2018   Moderna Sars-Covid-2 Vaccination 11/16/2019, 12/14/2019, 08/11/2020   Tdap 06/26/2018   Zoster Recombinat (Shingrix) 09/04/2017     Objective: Vital Signs: BP 119/79 (BP Location: Left Arm, Patient Position: Sitting, Cuff Size: Normal)   Pulse 92   Ht  (1.676 m)   Wt 216 lb (98 kg)   BMI 34.86 kg/m    Physical Exam Vitals and nursing note reviewed.  Constitutional:      Appearance: She is well-developed.  HENT:     Head: Normocephalic and atraumatic.  Eyes:     Conjunctiva/sclera: Conjunctivae normal.  Pulmonary:     Effort: Pulmonary effort is normal.  Abdominal:     Palpations: Abdomen is soft.  Musculoskeletal:     Cervical back: Normal range of motion.  Skin:    General: Skin is warm and dry.     Capillary Refill: Capillary refill takes less than 2 seconds.  Neurological:     Mental Status: She is alert and oriented to person, place, and time.  Psychiatric:        Behavior: Behavior normal.     Musculoskeletal Exam: C-spine, thoracic spine, lumbar spine have good range of motion with no discomfort.  Shoulder joints, elbow joints, wrist joints, MCPs, PIPs, DIPs have good range of motion with no synovitis.  PIP and DIP thickening consistent with osteoarthritis of both hands noted.  Hip joints have good range of motion with no discomfort.  No tenderness over trochanter bursa bilaterally.  Knee joints have good range of motion with no warmth or effusion.  Ankle joints have good range of motion with no tenderness or joint swelling.  CDAI Exam: CDAI Score: 0.4  Patient Global: 2 mm; Provider Global: 2 mm Swollen: 0 ; Tender: 0  Joint Exam 03/21/2021   No  joint exam has been documented for this visit   There is currently no information documented on the homunculus. Go to the Rheumatology activity and complete the homunculus joint exam.  Investigation: No additional findings.  Imaging: No results found.  Recent Labs: Lab Results  Component Value Date   WBC 8.5 12/09/2019   HGB 15.3 12/09/2019   PLT 201 12/09/2019   NA 143 12/09/2019   K 3.7 12/09/2019   CL 104 12/09/2019   CO2 30 12/09/2019   GLUCOSE 93 12/09/2019   BUN 11 12/09/2019   CREATININE 0.82 12/09/2019   BILITOT 0.4 12/09/2019   ALKPHOS 69 04/29/2017   AST 22 12/09/2019   ALT 29 12/09/2019   PROT 6.2 12/09/2019   ALBUMIN 4.2 04/29/2017   CALCIUM 9.4 12/09/2019   GFRAA 91 12/09/2019   QFTBGOLDPLUS NEGATIVE 12/09/2019    Speciality Comments:  Need Hep C quant every 6 months Prior therapy: methotrexate (oral ulcers) and Cimzia (inadequate response)   Procedures:  No procedures performed Allergies: Patient has no known allergies.   Assessment / Plan:     Visit Diagnoses: Rheumatoid arthritis with rheumatoid factor of multiple sites without organ or systems involvement (HCC) - Prior therapy: Actemra- inadequate response, methotrexate (oral ulcers) and Cimzia (inadequate response): She has no joint tenderness or synovitis on examination today.  She continues to have rheumatoid arthritis flares every 2 to 3 months.  She is on Enbrel 50 mg subcutaneous injections once weekly and Arava 10 mg alternating with 20 mg every other day.  She has been tolerating this regimen and has not missed any doses recently.  Her most recent dose of Enbrel was yesterday.  Her last prednisone taper was sent in on 02/24/2021.  We discussed the importance of avoiding frequent prednisone tapers.  Discussed the risks of long-term prednisone use.  We discussed that if she continues to have recurrent flares we will need to discuss other treatment options.  She does not want to make any medication  changes at this time.  She will remain on Enbrel and Arava as prescribed.  She was advised to notify us if she develops increased joint pain or joint swelling.  She will follow-up in the office in 5 months.  High risk medication use - Enbrel 50 mg subcutaneous injections once weekly (started 10/03/20) and Arava 10 mg alternating with 20 mg every other day. CBC and CMP updated on 12/14/2020.  According to the patient she has to have her lab work drawn at her PCPs office so she is planning on having labs drawn on Thursday.  Orders were provided to the patient today.  Her next lab work will be due in October and every 3 months to monitor for drug toxicity.  Standing orders for CBC and CMP remain in place.  TB Gold negative on 12/14/2020. She has not had any recent infections.  Discussed the importance of holding Enbrel and Arava if she develops signs or symptoms of an infection and to resume once the infection has completely cleared.  She has had 2 COVID-19 vaccine doses and 1 booster dose.  She is considering getting the second COVID-19 vaccine booster.  Chronic left shoulder pain: Resolved.  She has good range of motion with no discomfort at this time.  No tenderness palpation noted.  Primary osteoarthritis of both hands: She has PIP and DIP thickening consistent with osteoarthritis of both hands.  She was able to make a complete fist bilaterally.  She has no joint tenderness or inflammation on examination today.  We discussed the importance of joint protection and muscle strengthening.  Primary osteoarthritis of both feet: She is not experiencing any discomfort in her feet at this time.  In April she had a flare in the right ankle and was having difficulty bearing weight due to the severity of pain and swelling.  She took a prednisone taper which resolved her symptoms.  She has no tenderness or inflammation on examination today.  ANA positive - She has no clinical features of systemic lupus.  Trochanteric  bursitis of both hips: Resolved   Other medical conditions are listed as follows:  Former smoker  History of hepatitis C - HCV quant negative on 04/07/19.   History of seizures  History of COPD  Orders: No orders of the defined types were placed in this encounter.  No orders of the defined types were  placed in this encounter.   Follow-Up Instructions: Return in about 5 months (around 08/21/2021) for Rheumatoid arthritis, Osteoarthritis.   Gearldine Bienenstock, PA-C  Note - This record has been created using Dragon software.  Chart creation errors have been sought, but may not always  have been located. Such creation errors do not reflect on  the standard of medical care.

## 2021-03-14 ENCOUNTER — Other Ambulatory Visit: Payer: Self-pay | Admitting: *Deleted

## 2021-03-14 DIAGNOSIS — M0579 Rheumatoid arthritis with rheumatoid factor of multiple sites without organ or systems involvement: Secondary | ICD-10-CM

## 2021-03-14 MED ORDER — ENBREL MINI 50 MG/ML ~~LOC~~ SOCT: 1.0000 mL | SUBCUTANEOUS | 0 refills | Status: DC

## 2021-03-14 NOTE — Telephone Encounter (Signed)
Next Visit: 03/01/2021   Last Visit: 11/23/2020   Last Fill: 12/22/2020   DX: Rheumatoid arthritis with rheumatoid factor of multiple sites without organ or systems involvement   Current Dose per office note 11/23/2020, Enbrel 50 mg subcutaneous injections once weekly   Labs: 12/14/2020 Glucose  144 BUN  11 A1G Ratio       2.3  TB Gold: 12/14/2020 Neg   Okay to refill Enbrel?

## 2021-03-16 ENCOUNTER — Telehealth: Payer: Self-pay

## 2021-03-16 NOTE — Telephone Encounter (Signed)
Submitted a Prior Authorization request to [facility] for ENBREL via CoverMyMeds. Will update once we receive a response.  Key: [facility]   Prescription form filled out and awaiting prescriber signature. Will begin working on patient forms.

## 2021-03-16 NOTE — Telephone Encounter (Signed)
Patient's current prior Berkley Harvey is still good until 09/02/2021. Associate Professor for PAP renewal.  Auth# [id number]

## 2021-03-21 ENCOUNTER — Other Ambulatory Visit: Payer: Self-pay

## 2021-03-21 ENCOUNTER — Encounter: Payer: Self-pay | Admitting: Physician Assistant

## 2021-03-21 ENCOUNTER — Ambulatory Visit (INDEPENDENT_AMBULATORY_CARE_PROVIDER_SITE_OTHER): Payer: Medicare Other | Admitting: Physician Assistant

## 2021-03-21 VITALS — BP 119/79 | HR 92 | Ht 66.0 in | Wt 216.0 lb

## 2021-03-21 DIAGNOSIS — Z79899 Other long term (current) drug therapy: Secondary | ICD-10-CM

## 2021-03-21 DIAGNOSIS — M19041 Primary osteoarthritis, right hand: Secondary | ICD-10-CM | POA: Diagnosis not present

## 2021-03-21 DIAGNOSIS — R768 Other specified abnormal immunological findings in serum: Secondary | ICD-10-CM

## 2021-03-21 DIAGNOSIS — Z8619 Personal history of other infectious and parasitic diseases: Secondary | ICD-10-CM

## 2021-03-21 DIAGNOSIS — M7062 Trochanteric bursitis, left hip: Secondary | ICD-10-CM

## 2021-03-21 DIAGNOSIS — M19071 Primary osteoarthritis, right ankle and foot: Secondary | ICD-10-CM

## 2021-03-21 DIAGNOSIS — Z87891 Personal history of nicotine dependence: Secondary | ICD-10-CM

## 2021-03-21 DIAGNOSIS — M19072 Primary osteoarthritis, left ankle and foot: Secondary | ICD-10-CM

## 2021-03-21 DIAGNOSIS — M0579 Rheumatoid arthritis with rheumatoid factor of multiple sites without organ or systems involvement: Secondary | ICD-10-CM

## 2021-03-21 DIAGNOSIS — G8929 Other chronic pain: Secondary | ICD-10-CM

## 2021-03-21 DIAGNOSIS — M19042 Primary osteoarthritis, left hand: Secondary | ICD-10-CM

## 2021-03-21 DIAGNOSIS — M25512 Pain in left shoulder: Secondary | ICD-10-CM | POA: Diagnosis not present

## 2021-03-21 DIAGNOSIS — Z87898 Personal history of other specified conditions: Secondary | ICD-10-CM

## 2021-03-21 DIAGNOSIS — M7061 Trochanteric bursitis, right hip: Secondary | ICD-10-CM

## 2021-03-21 DIAGNOSIS — Z8709 Personal history of other diseases of the respiratory system: Secondary | ICD-10-CM

## 2021-03-21 NOTE — Telephone Encounter (Signed)
Patient does not need [employer] PAP renewal. She is enrolled into Enbrel patient assistance through 09/02/21. Will fax prior auth approval letter to [employer] as update.  Chesley Mires, PharmD, MPH Clinical Pharmacist (Rheumatology and Pulmonology)

## 2021-04-05 ENCOUNTER — Telehealth: Payer: Self-pay

## 2021-04-05 NOTE — Telephone Encounter (Signed)
Patient advised per Dr. Corliss Skains we can schedule an appointment with Ladona Ridgel today for evaluation of ankle joint swelling.  If her ankle is swollen we can inject with cortisone. Patient states she does not have transportation for today. Patient states she will see if she can get transportation for tomorrow. Patient will call back to schedule if she can find transportation.

## 2021-04-05 NOTE — Telephone Encounter (Signed)
Patient states her left ankle is swollen and painful and requesting prescription of Prednisone be sent to [facility] at [address].

## 2021-04-05 NOTE — Telephone Encounter (Signed)
Please a schedule an appointment with Christina Jenkins today for evaluation of ankle joint swelling.  If her ankle is swollen we can inject with cortisone.

## 2021-04-05 NOTE — Telephone Encounter (Signed)
Patient states she is having pain and selling in her left ankle. Patient states it is warm to the touch and red. Patient states it started last night. Patient states she has not injured her ankle. Patient states she is on Enbrel and Arava as prescribed. Patient denies missing any doses. Patient uses [facility]. Please advise.

## 2021-04-06 NOTE — Telephone Encounter (Signed)
Attempted to contact the patient to see if she was able to arrange transportation so we may schedule her an appointment. Left message for patient to call the office.

## 2021-06-06 ENCOUNTER — Telehealth: Payer: Self-pay | Admitting: *Deleted

## 2021-06-06 NOTE — Telephone Encounter (Signed)
Received a refill request for Enbrel. Patient advised she will need updated labs prior to sending prescription refill. Patient will call PCP and have labs performed with them. She will have them fax results.

## 2021-06-07 ENCOUNTER — Other Ambulatory Visit: Payer: Self-pay | Admitting: *Deleted

## 2021-06-07 ENCOUNTER — Telehealth: Payer: Self-pay | Admitting: Rheumatology

## 2021-06-07 DIAGNOSIS — Z79899 Other long term (current) drug therapy: Secondary | ICD-10-CM

## 2021-06-07 NOTE — Telephone Encounter (Signed)
Patient going to [facility] for labs today. Please send orders. Fax# [phone]

## 2021-06-07 NOTE — Telephone Encounter (Signed)
Lab Orders released and faxed.  °

## 2021-06-12 ENCOUNTER — Other Ambulatory Visit: Payer: Self-pay | Admitting: *Deleted

## 2021-06-12 DIAGNOSIS — M0579 Rheumatoid arthritis with rheumatoid factor of multiple sites without organ or systems involvement: Secondary | ICD-10-CM

## 2021-06-12 NOTE — Telephone Encounter (Signed)
Next Visit: 08/22/2021  Last Visit: 03/21/2021  Last Fill: 02/13/2021 Ranae Plumber),  03/14/2021 (Enbrel)   [id number] arthritis with rheumatoid factor of multiple sites without organ or systems involvement   Current Dose per office note 03/21/2021: Enbrel 50 mg subcutaneous injections once Arava 10 mg alternating with 20 mg every other day  Labs: 06/07/2021 Glucose 130, Potassium 3.3, Carbon Dioxide 30  TB Gold: negative on 12/14/2020   Okay to refill Arava and Enbrel?

## 2021-06-13 ENCOUNTER — Other Ambulatory Visit: Payer: Self-pay

## 2021-06-13 ENCOUNTER — Telehealth: Payer: Self-pay

## 2021-06-13 DIAGNOSIS — M0579 Rheumatoid arthritis with rheumatoid factor of multiple sites without organ or systems involvement: Secondary | ICD-10-CM

## 2021-06-13 MED ORDER — LEFLUNOMIDE 10 MG PO TABS: ORAL_TABLET | ORAL | 0 refills | Status: DC

## 2021-06-13 MED ORDER — ENBREL MINI 50 MG/ML ~~LOC~~ SOCT
1.0000 mL | SUBCUTANEOUS | 0 refills | Status: DC
Start: 2021-06-13 — End: 2021-09-27

## 2021-06-13 NOTE — Telephone Encounter (Signed)
[name] with [employer] left a voicemail stating they are trying to get a prescription for patient's Enbrel 50 mg mini cartridges.  Patient is completely out of medication and was told that we should have received a prescription yesterday, 06/12/21.  We did not receive one.  The last prescription we received was from Sherron Ales on 03/14/21.  If you have any questions or concerns, please call #[phone] option 1 for refills Fax #[phone]

## 2021-06-13 NOTE — Telephone Encounter (Signed)
Next Visit: 08/22/2021  Last Visit: 03/21/2021  Last Fill: 03/14/2021  DX: Rheumatoid arthritis with rheumatoid factor of multiple sites without organ or systems involvement  Current Dose per office note on 03/21/2021: Enbrel 50 mg subcutaneous injections once weekly  Labs: 06/07/2021 Glucose 130, Potassium 3.3, Carbon Dioxide 30 per telephone encounter on 06/12/2021  TB Gold: 12/14/2020 negative    Okay to refill enbrel mini?

## 2021-06-13 NOTE — Telephone Encounter (Signed)
Labs results received via fax from PCP's office.   Date of lab draw: 06/07/2021  Labs drawn: CMet, CBC  Results:  Glucose 130 Potassium 3.3 Carbon dioxide 30  Labs reviewed by Sherron Ales, PA-C and sent to the scan center.

## 2021-06-29 ENCOUNTER — Telehealth: Payer: Self-pay

## 2021-06-29 MED ORDER — PREDNISONE 5 MG PO TABS
ORAL_TABLET | ORAL | 0 refills | Status: DC
Start: 2021-06-29 — End: 2021-09-27

## 2021-06-29 NOTE — Telephone Encounter (Signed)
Ok to send in prednisone taper starting at 20 mg tapering by 5 mg every 2 days.   Please advise the patient to try increasing the dose of arava to 20 mg daily.  Recheck lab work 2 weeks after increasing the dose of arava.

## 2021-06-29 NOTE — Telephone Encounter (Signed)
I sent a message to Elmyra Ricks regarding prednisone taper and dose change of arava.

## 2021-06-29 NOTE — Telephone Encounter (Signed)
Patient left a voicemail stating she is having a lot of pain in her feet and hands and requesting a prescription of Prednisone to be sent to [facility] in [place].

## 2021-06-29 NOTE — Telephone Encounter (Signed)
See mychart message for details.

## 2021-08-08 NOTE — Progress Notes (Deleted)
Office Visit Note  Patient: Christina Jenkins             Date of Birth: [birth year]           MRN: [id number]             PCP: Encarnacion Slates, PA-C Referring: Encarnacion Slates, PA-C Visit Date: 08/22/2021 Occupation: @GUAROCC @  Subjective:  No chief complaint on file.   History of Present Illness: Christina Jenkins is a 60 y.o. female ***   Activities of Daily Living:  Patient reports morning stiffness for *** {minute/hour:19697}.   Patient {ACTIONS;DENIES/REPORTS:21021675::"Denies"} nocturnal pain.  Difficulty dressing/grooming: {ACTIONS;DENIES/REPORTS:21021675::"Denies"} Difficulty climbing stairs: {ACTIONS;DENIES/REPORTS:21021675::"Denies"} Difficulty getting out of chair: {ACTIONS;DENIES/REPORTS:21021675::"Denies"} Difficulty using hands for taps, buttons, cutlery, and/or writing: {ACTIONS;DENIES/REPORTS:21021675::"Denies"}  No Rheumatology ROS completed.   PMFS History:  Patient Active Problem List   Diagnosis Date Noted   Chest pain of uncertain etiology 04/30/2019   Abnormal nuclear stress test    History of COPD 05/31/2017   Fatigue 12/04/2016   History of seizures 10/23/2016   History of hepatitis C 10/23/2016   ANA positive 10/23/2016   Sicca syndrome, unspecified 10/23/2016   Seropositive rheumatoid arthritis (HCC) 08/01/2016   High risk medication use 08/01/2016   Hepatitis C 08/01/2016   Tobacco abuse 08/01/2016   Depression 08/01/2016   Complex partial seizure (HCC) 02/02/2016   MCI (mild cognitive impairment) 11/02/2015   Arthralgia 11/02/2015   Carpal tunnel syndrome 10/20/2015   Syncope 05/17/2015   Convulsions/seizures (HCC) 05/17/2015    Past Medical History:  Diagnosis Date   Arthritis 02/01/16   dx w/rheumatoid arthritis   Carpal tunnel syndrome 10/20/2015   right   Carpal tunnel syndrome on both sides    Dysuria    Hypokalemia    Liver disease    hepatitis c   Seasonal allergies    Seizures (HCC)    last 05/05/15    Family History  Problem  Relation Age of Onset   Pancreatic cancer Mother    COPD Father    Diabetes Brother    Past Surgical History:  Procedure Laterality Date   LEFT HEART CATH AND CORONARY ANGIOGRAPHY N/A 04/30/2019   Procedure: LEFT HEART CATH AND CORONARY ANGIOGRAPHY;  Surgeon: 05/02/2019, MD;  Location: [facility];  Service: Cardiovascular;  Laterality: N/A;   TONSILLECTOMY     Social History   Social History Narrative   Not on file   Immunization History  Administered Date(s) Administered   Influenza,inj,Quad PF,6+ Mos 05/16/2017, 05/15/2018   Moderna Sars-Covid-2 Vaccination 11/16/2019, 12/14/2019, 08/11/2020   Tdap 06/26/2018   Zoster Recombinat (Shingrix) 09/04/2017     Objective: Vital Signs: There were no vitals taken for this visit.   Physical Exam   Musculoskeletal Exam: ***  CDAI Exam: CDAI Score: -- Patient Global: --; Provider Global: -- Swollen: --; Tender: -- Joint Exam 08/22/2021   No joint exam has been documented for this visit   There is currently no information documented on the homunculus. Go to the Rheumatology activity and complete the homunculus joint exam.  Investigation: No additional findings.  Imaging: No results found.  Recent Labs: Lab Results  Component Value Date   WBC 8.5 12/09/2019   HGB 15.3 12/09/2019   PLT 201 12/09/2019   NA 143 12/09/2019   K 3.7 12/09/2019   CL 104 12/09/2019   CO2 30 12/09/2019   GLUCOSE 93 12/09/2019   BUN 11 12/09/2019   CREATININE 0.82 12/09/2019  BILITOT 0.4 12/09/2019   ALKPHOS 69 04/29/2017   AST 22 12/09/2019   ALT 29 12/09/2019   PROT 6.2 12/09/2019   ALBUMIN 4.2 04/29/2017   CALCIUM 9.4 12/09/2019   GFRAA 91 12/09/2019   QFTBGOLDPLUS NEGATIVE 12/09/2019    Speciality Comments: Need Hep C quant every 6 months Prior therapy: methotrexate (oral ulcers) and Cimzia (inadequate response)   Procedures:  No procedures performed Allergies: Patient has no known allergies.   Assessment /  Plan:     Visit Diagnoses: No diagnosis found.  Orders: No orders of the defined types were placed in this encounter.  No orders of the defined types were placed in this encounter.   Face-to-face time spent with patient was *** minutes. Greater than 50% of time was spent in counseling and coordination of care.  Follow-Up Instructions: No follow-ups on file.   Earnestine Mealing, CMA  Note - This record has been created using Editor, commissioning.  Chart creation errors have been sought, but may not always  have been located. Such creation errors do not reflect on  the standard of medical care.

## 2021-08-15 ENCOUNTER — Other Ambulatory Visit: Payer: Self-pay | Admitting: *Deleted

## 2021-08-15 DIAGNOSIS — M0579 Rheumatoid arthritis with rheumatoid factor of multiple sites without organ or systems involvement: Secondary | ICD-10-CM

## 2021-08-15 NOTE — Telephone Encounter (Signed)
Please schedule an appointment for further evaluation.

## 2021-08-15 NOTE — Telephone Encounter (Signed)
Patient sent a my chart message asking for a refill on a prednisone taper. She states "Both my hands and ankles are swollen and red and very painful".

## 2021-08-16 ENCOUNTER — Other Ambulatory Visit: Payer: Self-pay | Admitting: *Deleted

## 2021-08-16 ENCOUNTER — Encounter: Payer: Self-pay | Admitting: *Deleted

## 2021-08-16 DIAGNOSIS — Z79899 Other long term (current) drug therapy: Secondary | ICD-10-CM

## 2021-08-16 MED ORDER — PREDNISONE 5 MG PO TABS: ORAL_TABLET | ORAL | 0 refills | Status: DC

## 2021-08-16 NOTE — Telephone Encounter (Signed)
Would you like to see the patient in the office prior to sending in another prednisone taper?  She has not been seen since July 2022 and continues to have recurrent flares requiring prednisone.

## 2021-08-16 NOTE — Telephone Encounter (Signed)
Ok to send in prednisone taper starting at 10 mg tapering by 2.5 mg every week.  Please apply for humira.  We can obtain consent at her follow up visit.  Her current regimen is clearly not controlling her RA so we will need to make a medication change at the follow up visit.

## 2021-08-16 NOTE — Telephone Encounter (Signed)
Patient declined an earlier appointment per Andrea's note.  We can send a prednisone taper may be starting at a lower dose and a longer period.  We will have to add medications and modify treatment at the follow-up visit.

## 2021-08-16 NOTE — Addendum Note (Signed)
Addended by: Henriette Combs on: 08/16/2021 01:32 PM   Modules accepted: Orders

## 2021-08-16 NOTE — Telephone Encounter (Signed)
Patient returned call to the office. Patient advised she would need to schedule an appointment to evaluate swelling in hands and ankles. Patient states she rescheduled her appointment for 10/05/2021. Patient advised she would need a sooner appointment as she is requesting prednisone. Patient refused to make a sooner appointment. Patient advised we may not be able to prescribe the prednisone without that sooner appointment. Patient advised she is also due to update labs prior to refill of Arava. Lab orders faxed to [facility].

## 2021-08-17 ENCOUNTER — Telehealth: Payer: Self-pay | Admitting: Pharmacist

## 2021-08-17 NOTE — Telephone Encounter (Signed)
Please start Humira BIV.  Dose: 40mg  SQ every 14 days  Dx: Rheumatoid arthritis (M05.9)  Previously tried therapies: Enbrel - inadequate clinical response (recurrent prednisone tapers since starting)  , PharmD, MPH, BCPS Clinical Pharmacist (Rheumatology and Pulmonology)

## 2021-08-22 ENCOUNTER — Telehealth: Payer: Self-pay | Admitting: *Deleted

## 2021-08-22 ENCOUNTER — Other Ambulatory Visit (HOSPITAL_COMMUNITY): Payer: Self-pay

## 2021-08-22 ENCOUNTER — Other Ambulatory Visit: Payer: Self-pay | Admitting: *Deleted

## 2021-08-22 ENCOUNTER — Ambulatory Visit: Payer: Medicare Other | Admitting: Rheumatology

## 2021-08-22 DIAGNOSIS — Z8619 Personal history of other infectious and parasitic diseases: Secondary | ICD-10-CM

## 2021-08-22 DIAGNOSIS — G8929 Other chronic pain: Secondary | ICD-10-CM

## 2021-08-22 DIAGNOSIS — M19071 Primary osteoarthritis, right ankle and foot: Secondary | ICD-10-CM

## 2021-08-22 DIAGNOSIS — M0579 Rheumatoid arthritis with rheumatoid factor of multiple sites without organ or systems involvement: Secondary | ICD-10-CM

## 2021-08-22 DIAGNOSIS — M7061 Trochanteric bursitis, right hip: Secondary | ICD-10-CM

## 2021-08-22 DIAGNOSIS — Z87898 Personal history of other specified conditions: Secondary | ICD-10-CM

## 2021-08-22 DIAGNOSIS — R768 Other specified abnormal immunological findings in serum: Secondary | ICD-10-CM

## 2021-08-22 DIAGNOSIS — Z79899 Other long term (current) drug therapy: Secondary | ICD-10-CM

## 2021-08-22 DIAGNOSIS — Z87891 Personal history of nicotine dependence: Secondary | ICD-10-CM

## 2021-08-22 DIAGNOSIS — M19041 Primary osteoarthritis, right hand: Secondary | ICD-10-CM

## 2021-08-22 DIAGNOSIS — Z8709 Personal history of other diseases of the respiratory system: Secondary | ICD-10-CM

## 2021-08-22 MED ORDER — LEFLUNOMIDE 20 MG PO TABS: 20.0000 mg | ORAL_TABLET | Freq: Every day | ORAL | 0 refills | Status: DC

## 2021-08-22 NOTE — Telephone Encounter (Signed)
Next Visit: 10/05/2021  Last Visit: 03/21/2021  Last Fill:   DX: Rheumatoid arthritis with rheumatoid factor of multiple sites without organ or systems involvement  Current Dose per phone note 06/29/2021: increasing the dose of arava to 20 mg daily  Labs: 08/21/2021 WBC 11.7, Neutrophils (Absolute) 8.1  Okay to refill Arava?

## 2021-08-22 NOTE — Telephone Encounter (Signed)
Submitted a Prior Authorization request to [facility] for HUMIRA via CoverMyMeds. Will update once we receive a response.  Key: [id number]  Patient previously on Enbrel through PAP so will need to pursue PAP through [employer] once prior authorization is approved. Patient does not have application - will have to mail to her. She will need to submit income documents with the application  Chesley Mires, PharmD, MPH, BCPS Clinical Pharmacist (Rheumatology and Pulmonology)

## 2021-08-22 NOTE — Telephone Encounter (Signed)
Received notification from [facility] regarding a prior authorization for HUMIRA. Authorization has been APPROVED from 08/22/21 to 09/02/2022.   Per test claim, copay for 28 days supply is $1913.97  Authorization # [id number]  ATC patient to review. Will mail Humira PAP application to address on file. Provider portion placed in folder to be signed by Sherron Ales, PA-C  Chesley Mires, PharmD, MPH, BCPS Clinical Pharmacist (Rheumatology and Pulmonology)

## 2021-08-22 NOTE — Telephone Encounter (Signed)
Patient returned call. She states that she has 2 Enbrel doses at home. Advised that it is best that we transition her to Humira rather than reapplying for Enbrel.  She understands that she should return application to clinic with income documents for both her and her husband shortly after new year to prevent her being off of therapy for too long  Chesley Mires, PharmD, MPH, BCPS Clinical Pharmacist (Rheumatology and Pulmonology)

## 2021-08-22 NOTE — Telephone Encounter (Signed)
Labs received from:Amy Leavy Cella, PA-C  Drawn on:08/21/2021  Reviewed by:Sherron Ales, PA-C  Labs drawn:CBC, CMP  Results:WBC 11.7, Neutrophils (Absolute) 8.1  Patient is currently on Enbrel 50 mg SQ weekly and Arava  20 mg po daily.

## 2021-08-23 NOTE — Telephone Encounter (Signed)
Signed provider portion of [employer] PAP application received. Placed with insurance card copy, med list, and PA approval letter in "PAP pending info" folder in pharmacy office while we await patient portion and income docs.  Chesley Mires, PharmD, MPH, BCPS Clinical Pharmacist (Rheumatology and Pulmonology)

## 2021-09-13 NOTE — Telephone Encounter (Addendum)
Submitted Patient Assistance Application to [employer] for HUMIRA along with provider portion, patient portion, med list, insurance card copy, and income documents. Will update patient when we receive a response.  Fax# [phone] Phone# [phone]  Christina Jenkins Christina Jenkins, PharmD, MPH, BCPS Clinical Pharmacist (Rheumatology and Pulmonology)

## 2021-09-14 MED ORDER — PREDNISONE 5 MG PO TABS: ORAL_TABLET | ORAL | 0 refills | Status: DC

## 2021-09-14 NOTE — Telephone Encounter (Signed)
Ok to send in prednisone 20 mg tapering by 5 mg every 4 days.

## 2021-09-22 NOTE — Progress Notes (Deleted)
Office Visit Note  Patient: Christina Jenkins             Date of Birth: Apr 19, 1961           MRN: [id number]             PCP: Encarnacion Slates, PA-C Referring: Encarnacion Slates, PA-C Visit Date: 10/05/2021 Occupation: @GUAROCC @  Subjective:  No chief complaint on file.   History of Present Illness: Christina Jenkins is a 61 y.o. female ***   Activities of Daily Living:  Patient reports morning stiffness for *** {minute/hour:19697}.   Patient {ACTIONS;DENIES/REPORTS:21021675::"Denies"} nocturnal pain.  Difficulty dressing/grooming: {ACTIONS;DENIES/REPORTS:21021675::"Denies"} Difficulty climbing stairs: {ACTIONS;DENIES/REPORTS:21021675::"Denies"} Difficulty getting out of chair: {ACTIONS;DENIES/REPORTS:21021675::"Denies"} Difficulty using hands for taps, buttons, cutlery, and/or writing: {ACTIONS;DENIES/REPORTS:21021675::"Denies"}  No Rheumatology ROS completed.   PMFS History:  Patient Active Problem List   Diagnosis Date Noted   Chest pain of uncertain etiology 04/30/2019   Abnormal nuclear stress test    History of COPD 05/31/2017   Fatigue 12/04/2016   History of seizures 10/23/2016   History of hepatitis C 10/23/2016   ANA positive 10/23/2016   Sicca syndrome, unspecified 10/23/2016   Seropositive rheumatoid arthritis (HCC) 08/01/2016   High risk medication use 08/01/2016   Hepatitis C 08/01/2016   Tobacco abuse 08/01/2016   Depression 08/01/2016   Complex partial seizure (HCC) 02/02/2016   MCI (mild cognitive impairment) 11/02/2015   Arthralgia 11/02/2015   Carpal tunnel syndrome 10/20/2015   Syncope 05/17/2015   Convulsions/seizures (HCC) 05/17/2015    Past Medical History:  Diagnosis Date   Arthritis 02/01/16   dx w/rheumatoid arthritis   Carpal tunnel syndrome 10/20/2015   right   Carpal tunnel syndrome on both sides    Dysuria    Hypokalemia    Liver disease    hepatitis c   Seasonal allergies    Seizures (HCC)    last 05/05/15    Family History  Problem  Relation Age of Onset   Pancreatic cancer Mother    COPD Father    Diabetes Brother    Past Surgical History:  Procedure Laterality Date   LEFT HEART CATH AND CORONARY ANGIOGRAPHY N/A 04/30/2019   Procedure: LEFT HEART CATH AND CORONARY ANGIOGRAPHY;  Surgeon: Runell Gess, MD;  Location: [facility];  Service: Cardiovascular;  Laterality: N/A;   TONSILLECTOMY     Social History   Social History Narrative   Not on file   Immunization History  Administered Date(s) Administered   Influenza,inj,Quad PF,6+ Mos 05/16/2017, 05/15/2018   Moderna Sars-Covid-2 Vaccination 11/16/2019, 12/14/2019, 08/11/2020   Tdap 06/26/2018   Zoster Recombinat (Shingrix) 09/04/2017     Objective: Vital Signs: There were no vitals taken for this visit.   Physical Exam   Musculoskeletal Exam: ***  CDAI Exam: CDAI Score: -- Patient Global: --; Provider Global: -- Swollen: --; Tender: -- Joint Exam 10/05/2021   No joint exam has been documented for this visit   There is currently no information documented on the homunculus. Go to the Rheumatology activity and complete the homunculus joint exam.  Investigation: No additional findings.  Imaging: No results found.  Recent Labs: Lab Results  Component Value Date   WBC 8.5 12/09/2019   HGB 15.3 12/09/2019   PLT 201 12/09/2019   NA 143 12/09/2019   K 3.7 12/09/2019   CL 104 12/09/2019   CO2 30 12/09/2019   GLUCOSE 93 12/09/2019   BUN 11 12/09/2019   CREATININE 0.82 12/09/2019  BILITOT 0.4 12/09/2019   ALKPHOS 69 04/29/2017   AST 22 12/09/2019   ALT 29 12/09/2019   PROT 6.2 12/09/2019   ALBUMIN 4.2 04/29/2017   CALCIUM 9.4 12/09/2019   GFRAA 91 12/09/2019   QFTBGOLDPLUS NEGATIVE 12/09/2019    Speciality Comments: Need Hep C quant every 6 months Prior therapy: methotrexate (oral ulcers) and Cimzia (inadequate response)   Procedures:  No procedures performed Allergies: Patient has no known allergies.   Assessment /  Plan:     Visit Diagnoses: Rheumatoid arthritis with rheumatoid factor of multiple sites without organ or systems involvement (HCC)  High risk medication use  Chronic left shoulder pain  Primary osteoarthritis of both hands  Primary osteoarthritis of both feet  ANA positive  Trochanteric bursitis of both hips  Former smoker  History of hepatitis C  History of seizures  History of COPD  Tobacco abuse  Orders: No orders of the defined types were placed in this encounter.  No orders of the defined types were placed in this encounter.   Face-to-face time spent with patient was *** minutes. Greater than 50% of time was spent in counseling and coordination of care.  Follow-Up Instructions: No follow-ups on file.   Ofilia Neas, PA-C  Note - This record has been created using Dragon software.  Chart creation errors have been sought, but may not always  have been located. Such creation errors do not reflect on  the standard of medical care.

## 2021-09-26 ENCOUNTER — Other Ambulatory Visit: Payer: Self-pay | Admitting: Pharmacist

## 2021-09-26 NOTE — Progress Notes (Signed)
Error

## 2021-09-26 NOTE — Telephone Encounter (Addendum)
Received a verbal confirmation from  [employer] regarding an approval for HUMIRA patient assistance from 09/20/21 to 09/02/22.  Requested approval letter be refaxed  Phone number: [phone]  Patient scheduled for Humira new start on 09/27/21. She confirmed that she does not have any ongoing infections, is not taking any antibiotics, and does not have any upcoming procedures/surgeries. Last dose of Enbrel was 3 weeks ago.   Chesley Mires, PharmD, MPH, BCPS Clinical Pharmacist (Rheumatology and Pulmonology)

## 2021-09-27 ENCOUNTER — Other Ambulatory Visit: Payer: Self-pay

## 2021-09-27 ENCOUNTER — Ambulatory Visit: Payer: Medicare Other | Admitting: Pharmacist

## 2021-09-27 VITALS — BP 108/75 | HR 84

## 2021-09-27 DIAGNOSIS — Z8619 Personal history of other infectious and parasitic diseases: Secondary | ICD-10-CM

## 2021-09-27 DIAGNOSIS — Z7189 Other specified counseling: Secondary | ICD-10-CM

## 2021-09-27 DIAGNOSIS — M0579 Rheumatoid arthritis with rheumatoid factor of multiple sites without organ or systems involvement: Secondary | ICD-10-CM

## 2021-09-27 DIAGNOSIS — Z79899 Other long term (current) drug therapy: Secondary | ICD-10-CM

## 2021-09-27 MED ORDER — PREDNISONE 5 MG PO TABS: ORAL_TABLET | ORAL | 0 refills | Status: DC

## 2021-09-27 MED ORDER — HUMIRA (2 PEN) 40 MG/0.4ML ~~LOC~~ AJKT: 40.0000 mg | AUTO-INJECTOR | SUBCUTANEOUS | 0 refills | Status: DC

## 2021-09-27 NOTE — Patient Instructions (Signed)
Your next HUMIRA dose is due on 10/11/21, 10/25/21, and every 14 days thereafter  CONTINUE leflunomide as prescribed  HOLD HUMIRA and leflunomide if you have signs or symptoms of an infection. You can resume once you feel better or back to your baseline. HOLD HUMIRA and leflunomide if you start antibiotics to treat an infection. HOLD HUMIRA and leflunomide around the time of surgery/procedures. Your surgeon will be able to provide recommendations on when to hold BEFORE and when you are cleared to RESUME.  Pharmacy information: Your prescription will be shipped from [facility]. Their phone number is [phone]  Labs are due in 1 month then every 3 months. Lab hours are from Monday to Thursday 1:30-4:30pm and Friday 1:30-4pm. You do not need an appointment if you come for labs during these times.  How to manage an injection site reaction: Remember the 5 C's: COUNTER - leave on the counter at least 30 minutes but up to overnight to bring medication to room temperature. This may help prevent stinging COLD - place something cold (like an ice gel pack or cold water bottle) on the injection site just before cleansing with alcohol. This may help reduce pain CLARITIN - use Claritin (generic name is loratadine) for the first two weeks of treatment or the day of, the day before, and the day after injecting. This will help to minimize injection site reactions CORTISONE CREAM - apply if injection site is irritated and itching CALL ME - if injection site reaction is bigger than the size of your fist, looks infected, blisters, or if you develop hives

## 2021-09-27 NOTE — Progress Notes (Signed)
Pharmacy Note  Subjective:   Patient presents to clinic today to receive first dose of Humira. Her last dose of Enbrel was approximately three weeks ago.  Currently has two days left of prednisone taper - requesting additional prednisone due to inflammation and pain  Patient running a fever or have signs/symptoms of infection? No  Patient currently on antibiotics for the treatment of infection? No  Patient have any upcoming invasive procedures/surgeries? No  Objective: CMP     Component Value Date/Time   NA 143 12/09/2019 1323   K 3.7 12/09/2019 1323   CL 104 12/09/2019 1323   CO2 30 12/09/2019 1323   GLUCOSE 93 12/09/2019 1323   BUN 11 12/09/2019 1323   CREATININE 0.82 12/09/2019 1323   CALCIUM 9.4 12/09/2019 1323   PROT 6.2 12/09/2019 1323   ALBUMIN 4.2 04/29/2017 1001   AST 22 12/09/2019 1323   ALT 29 12/09/2019 1323   ALKPHOS 69 04/29/2017 1001   BILITOT 0.4 12/09/2019 1323   GFRNONAA 78 12/09/2019 1323   GFRAA 91 12/09/2019 1323    CBC    Component Value Date/Time   WBC 8.5 12/09/2019 1323   RBC 4.95 12/09/2019 1323   HGB 15.3 12/09/2019 1323   HCT 45.8 (H) 12/09/2019 1323   PLT 201 12/09/2019 1323   MCV 92.5 12/09/2019 1323   MCH 30.9 12/09/2019 1323   MCHC 33.4 12/09/2019 1323   RDW 12.3 12/09/2019 1323   LYMPHSABS 1,556 12/09/2019 1323   MONOABS 0.8 06/26/2018 1035   EOSABS 162 12/09/2019 1323   BASOSABS 94 12/09/2019 1323    Baseline Immunosuppressant Therapy Labs TB GOLD - negative on 12/14/20  Hepatitis Panel: history of positive Hep C so she has Hep R RNA quantitative drawn every 6 months  HIV - negative (12/28/2015)  Immunoglobulins - negative   SPEP Serum Protein Electrophoresis Latest Ref Rng & Units 12/09/2019  Total Protein 6.1 - 8.1 g/dL 6.2   I6OE No results found for: G6PDH TPMT No results found for: TPMT   Chest x-ray: 07/23/20 - wnl  Assessment/Plan:  Counseled patient that Humira is a TNF blocking agent.  Counseled patient on  purpose, proper use, and adverse effects of Humira.  Reviewed the most common adverse effects including infections, headache, and injection site reactions. Discussed that there is the possibility of an increased risk of malignancy including non-melanoma skin cancer but it is not well understood if this increased risk is due to the medication or the disease state.  Advised patient to get yearly dermatology exams due to risk of skin cancer. Counseled patient that Humira should be held prior to scheduled surgery.  Counseled patient to avoid live vaccines while on Humira.  Recommend annual influenza, PCV 15 or PCV20 or Pneumovax 23, and Shingrix as indicated.  Reviewed the importance of regular labs while on Humira therapy.  Will monitor CBC and CMP 1 month after starting and then every 3 months routinely thereafter. Will monitor TB gold annually. Standing orders placed.    Provided patient with medication education material and answered all questions.  Patient consented to Humira.  Will upload consent into the media tab.  Reviewed storage instructions of Humira.  Patient verbalized understanding.   Patient will reach out to dermatology clinic in [place], [place] to establish care. Lavetta Nielsen been advised to notify our clinic if this dermatology clinic requires  Dose will be for rheumatoid arthritis Humira 40 mg every 14 days.  She will continue leflunomide 20mg  daily. Prednisone taper sent today. 20mg   daily x 4 days,  daily x 4 days,  daily x 4 days, 5 mg daily x 4 days  Demonstrated proper injection technique with Humira demo device  Patient able to demonstrate proper injection technique using the teach back method.  Patient self injected in the right thigh with:  Sample Medication: Humira /0.14mL pen NDC: [address]-40 Lot: [id number] Expiration: 10/2022  Patient tolerated well.  Observed for 30 mins in office for adverse reaction and none noted.   Patient is to return in 1 month for labs and 6-8 weeks  for follow-up appointment.  Standing orders placed. She has labs completed at [facility] within her PCP office ([facility])  Humira approved through patient assistance .   Rx sent to: [facility] for Humira: [phone].  Patient provided with pharmacy phone number and advised to call later this week to schedule shipment to home. Patient will be receiving 9-month Humira shipment on Wednesday, 10/04/21.  Patient will need CBC, CMP, and Hepatitis C RNA quantitative drawn in 1 month. Future orders under [facility] placed today.   All questions encouraged and answered.  Instructed patient to call with any further questions or concerns.  Orders Placed This Encounter  Procedures   CBC with Differential/Platelet    Standing Status:   Future    Standing Expiration Date:   09/27/2022   COMPLETE METABOLIC PANEL WITH GFR    Standing Status:   Future    Standing Expiration Date:   09/27/2022   HCV RNA quant    Standing Status:   Future    Standing Expiration Date:   09/27/2022     Chesley Mires, PharmD, MPH, BCPS, CPP Clinical Pharmacist (Rheumatology and Pulmonology)  09/27/2021 10:30 AM

## 2021-09-28 ENCOUNTER — Telehealth: Payer: Self-pay | Admitting: *Deleted

## 2021-09-28 NOTE — Telephone Encounter (Signed)
Labs received from:[facility]  Drawn on:08/21/2021 and 06/22/2021  Reviewed by:Sherron Ales, PA-C  Labs drawn: Hgb A1C, Lipid panel and CMP     Results:Glucose 122   Triglycerides 158   Hgb A1C 6.0

## 2021-10-05 ENCOUNTER — Ambulatory Visit: Payer: Medicare Other | Admitting: Rheumatology

## 2021-10-05 DIAGNOSIS — R768 Other specified abnormal immunological findings in serum: Secondary | ICD-10-CM

## 2021-10-05 DIAGNOSIS — Z8709 Personal history of other diseases of the respiratory system: Secondary | ICD-10-CM

## 2021-10-05 DIAGNOSIS — Z79899 Other long term (current) drug therapy: Secondary | ICD-10-CM

## 2021-10-05 DIAGNOSIS — Z8619 Personal history of other infectious and parasitic diseases: Secondary | ICD-10-CM

## 2021-10-05 DIAGNOSIS — Z720 Tobacco use: Secondary | ICD-10-CM

## 2021-10-05 DIAGNOSIS — M19071 Primary osteoarthritis, right ankle and foot: Secondary | ICD-10-CM

## 2021-10-05 DIAGNOSIS — M0579 Rheumatoid arthritis with rheumatoid factor of multiple sites without organ or systems involvement: Secondary | ICD-10-CM

## 2021-10-05 DIAGNOSIS — G8929 Other chronic pain: Secondary | ICD-10-CM

## 2021-10-05 DIAGNOSIS — Z87891 Personal history of nicotine dependence: Secondary | ICD-10-CM

## 2021-10-05 DIAGNOSIS — M19041 Primary osteoarthritis, right hand: Secondary | ICD-10-CM

## 2021-10-05 DIAGNOSIS — Z87898 Personal history of other specified conditions: Secondary | ICD-10-CM

## 2021-10-05 DIAGNOSIS — M7061 Trochanteric bursitis, right hip: Secondary | ICD-10-CM

## 2021-10-11 ENCOUNTER — Telehealth: Payer: Self-pay | Admitting: *Deleted

## 2021-10-11 MED ORDER — PREDNISONE 5 MG PO TABS: ORAL_TABLET | ORAL | 0 refills | Status: DC

## 2021-10-11 NOTE — Addendum Note (Signed)
Addended by: Henriette Combs on: 10/11/2021 02:01 PM   Modules accepted: Orders

## 2021-10-11 NOTE — Telephone Encounter (Signed)
Error

## 2021-10-11 NOTE — Telephone Encounter (Signed)
Okay to send in prednisone 20 mg tapering by 4 mg every 4 days.  Please make the patient aware of the risks of long-term and frequent prednisone use.

## 2021-10-25 NOTE — Progress Notes (Deleted)
? ?Office Visit Note ? ?Patient: Christina Jenkins             ?Date of Birth: 06-25-1961           ?MRN: [id number]             ?PCP: Jalene Mullet, PA-C ?Referring: Jalene Mullet, PA-C ?Visit Date: 11/08/2021 ?Occupation: @GUAROCC @ ? ?Subjective:  ?No chief complaint on file. ? ? ?History of Present Illness: Christina Jenkins is a 61 y.o. female ***  ? ?Activities of Daily Living:  ?Patient reports morning stiffness for *** {minute/hour:19697}.   ?Patient {ACTIONS;DENIES/REPORTS:21021675::"Denies"} nocturnal pain.  ?Difficulty dressing/grooming: {ACTIONS;DENIES/REPORTS:21021675::"Denies"} ?Difficulty climbing stairs: {ACTIONS;DENIES/REPORTS:21021675::"Denies"} ?Difficulty getting out of chair: {ACTIONS;DENIES/REPORTS:21021675::"Denies"} ?Difficulty using hands for taps, buttons, cutlery, and/or writing: {ACTIONS;DENIES/REPORTS:21021675::"Denies"} ? ?No Rheumatology ROS completed.  ? ?PMFS History:  ?Patient Active Problem List  ? Diagnosis Date Noted  ? Chest pain of uncertain etiology [id number]  ? Abnormal nuclear stress test   ? History of COPD 05/31/2017  ? Fatigue 12/04/2016  ? History of seizures 10/23/2016  ? History of hepatitis C 10/23/2016  ? ANA positive 10/23/2016  ? Sicca syndrome, unspecified 10/23/2016  ? Seropositive rheumatoid arthritis ([place]) 08/01/2016  ? High risk medication use 08/01/2016  ? Hepatitis C 08/01/2016  ? Tobacco abuse 08/01/2016  ? Depression 08/01/2016  ? Complex partial seizure ([place]) 02/02/2016  ? MCI (mild cognitive impairment) 11/02/2015  ? Arthralgia 11/02/2015  ? Carpal tunnel syndrome 10/20/2015  ? Syncope 05/17/2015  ? Convulsions/seizures ([place]) 05/17/2015  ?  ?Past Medical History:  ?Diagnosis Date  ? Arthritis 02/01/16  ? dx w/rheumatoid arthritis  ? Carpal tunnel syndrome 10/20/2015  ? right  ? Carpal tunnel syndrome on both sides   ? Dysuria   ? Hypokalemia   ? Liver disease   ? hepatitis c  ? Seasonal allergies   ? Seizures ([place])   ? last 05/05/15  ?  ?Family History  ?Problem  Relation Age of Onset  ? Pancreatic cancer Mother   ? COPD Father   ? Diabetes Brother   ? ?Past Surgical History:  ?Procedure Laterality Date  ? LEFT HEART CATH AND CORONARY ANGIOGRAPHY N/A 04/30/2019  ? Procedure: LEFT HEART CATH AND CORONARY ANGIOGRAPHY;  Surgeon: Lorretta Harp, MD;  Location: [facility];  Service: Cardiovascular;  Laterality: N/A;  ? TONSILLECTOMY    ? ?Social History  ? ?Social History Narrative  ? Not on file  ? ?Immunization History  ?Administered Date(s) Administered  ? Influenza,inj,Quad PF,6+ Mos 05/16/2017, 05/15/2018  ? Moderna Sars-Covid-2 Vaccination 11/16/2019, 12/14/2019, 08/11/2020  ? Tdap 06/26/2018  ? Zoster Recombinat (Shingrix) 09/04/2017  ?  ? ?Objective: ?Vital Signs: There were no vitals taken for this visit.  ? ?Physical Exam  ? ?Musculoskeletal Exam: *** ? ?CDAI Exam: ?CDAI Score: -- ?Patient Global: --; Provider Global: -- ?Swollen: --; Tender: -- ?Joint Exam 11/08/2021  ? ?No joint exam has been documented for this visit  ? ?There is currently no information documented on the homunculus. Go to the Rheumatology activity and complete the homunculus joint exam. ? ?Investigation: ?No additional findings. ? ?Imaging: ?No results found. ? ?Recent Labs: ?Lab Results  ?Component Value Date  ? WBC 8.5 12/09/2019  ? HGB 15.3 12/09/2019  ? PLT 201 12/09/2019  ? NA 143 12/09/2019  ? K 3.7 12/09/2019  ? CL 104 12/09/2019  ? CO2 30 12/09/2019  ? GLUCOSE 93 12/09/2019  ? BUN 11 12/09/2019  ? CREATININE 0.82 12/09/2019  ?  BILITOT 0.4 12/09/2019  ? ALKPHOS 69 04/29/2017  ? AST 22 12/09/2019  ? ALT 29 12/09/2019  ? PROT 6.2 12/09/2019  ? ALBUMIN 4.2 04/29/2017  ? CALCIUM 9.4 12/09/2019  ? GFRAA 91 12/09/2019  ? QFTBGOLDPLUS NEGATIVE 12/09/2019  ? ? ?Speciality Comments: Need Hep C quant every 6 months ?Prior therapy: methotrexate (oral ulcers) and Cimzia (inadequate response) , Enbrel stoped Jan 2023 ?Humira started 09/27/21 ? ?Procedures:  ?No procedures performed ?Allergies:  Patient has no known allergies.  ? ?Assessment / Plan:     ?Visit Diagnoses: No diagnosis found. ? ?Orders: ?No orders of the defined types were placed in this encounter. ? ?No orders of the defined types were placed in this encounter. ? ? ?Face-to-face time spent with patient was *** minutes. Greater than 50% of time was spent in counseling and coordination of care. ? ?Follow-Up Instructions: No follow-ups on file. ? ? ?Earnestine Mealing, CMA ? ?Note - This record has been created using [employer].  ?Chart creation errors have been sought, but may not always  ?have been located. Such creation errors do not reflect on  ?the standard of medical care.  ?

## 2021-10-27 ENCOUNTER — Other Ambulatory Visit: Payer: Self-pay | Admitting: Physician Assistant

## 2021-10-27 DIAGNOSIS — M0579 Rheumatoid arthritis with rheumatoid factor of multiple sites without organ or systems involvement: Secondary | ICD-10-CM

## 2021-10-30 NOTE — Progress Notes (Signed)
Office Visit Note  Patient: Christina Jenkins             Date of Birth: November 11, 1960           MRN: [id number]             PCP: Jalene Mullet, PA-C Referring: Jalene Mullet, PA-C Visit Date: 10/31/2021 Occupation: @GUAROCC @  Subjective:  Pain in both hands  History of Present Illness: Christina Jenkins is a 61 y.o. female with history of seropositive rheumatoid arthritis.  She returns today after her last visit in July 2022.  She was a started on Humira in January 2023.  Prior to that she was on Enbrel subcutaneous injections which were not working for her.  She was off Enbrel for about 4 weeks as she ran out of the medication prior to starting Humira.  Since she started Humira she is taking 2 courses of prednisone.  She states that she tapers off Humira her symptoms come back.  She has been taking leflunomide 20 mg p.o. daily on a daily basis.  None of the other joints are painful.  Activities of Daily Living:  Patient reports morning stiffness for 2 hours.   Patient Reports nocturnal pain.  Difficulty dressing/grooming: Denies Difficulty climbing stairs: Denies Difficulty getting out of chair: Denies Difficulty using hands for taps, buttons, cutlery, and/or writing: Reports  Review of Systems  Constitutional:  Negative for fatigue.  HENT:  Negative for mouth sores, mouth dryness and nose dryness.   Eyes:  Negative for pain, itching and dryness.  Respiratory:  Negative for shortness of breath and difficulty breathing.   Cardiovascular:  Negative for chest pain and palpitations.  Gastrointestinal:  Negative for blood in stool, constipation and diarrhea.  Endocrine: Negative for increased urination.  Genitourinary:  Negative for difficulty urinating.  Musculoskeletal:  Positive for joint pain, joint pain, joint swelling and morning stiffness. Negative for myalgias, muscle tenderness and myalgias.  Skin:  Negative for color change, rash and redness.  Allergic/Immunologic: Negative for  susceptible to infections.  Neurological:  Positive for headaches. Negative for dizziness, numbness, memory loss and weakness.  Hematological:  Negative for bruising/bleeding tendency.  Psychiatric/Behavioral:  Negative for confusion.    PMFS History:  Patient Active Problem List   Diagnosis Date Noted   Chest pain of uncertain etiology [id number]   Abnormal nuclear stress test    History of COPD 05/31/2017   Fatigue 12/04/2016   History of seizures 10/23/2016   History of hepatitis C 10/23/2016   ANA positive 10/23/2016   Sicca syndrome, unspecified 10/23/2016   Seropositive rheumatoid arthritis ([place]) 08/01/2016   High risk medication use 08/01/2016   Hepatitis C 08/01/2016   Tobacco abuse 08/01/2016   Depression 08/01/2016   Complex partial seizure ([place]) 02/02/2016   MCI (mild cognitive impairment) 11/02/2015   Arthralgia 11/02/2015   Carpal tunnel syndrome 10/20/2015   Syncope 05/17/2015   Convulsions/seizures ([place]) 05/17/2015    Past Medical History:  Diagnosis Date   Arthritis 02/01/16   dx w/rheumatoid arthritis   Carpal tunnel syndrome 10/20/2015   right   Carpal tunnel syndrome on both sides    Dysuria    Hypokalemia    Liver disease    hepatitis c   Seasonal allergies    Seizures ([place])    last 05/05/15    Family History  Problem Relation Age of Onset   Pancreatic cancer Mother    COPD Father    Diabetes Brother  Past Surgical History:  Procedure Laterality Date   LEFT HEART CATH AND CORONARY ANGIOGRAPHY N/A 04/30/2019   Procedure: LEFT HEART CATH AND CORONARY ANGIOGRAPHY;  Surgeon: Lorretta Harp, MD;  Location: [facility];  Service: Cardiovascular;  Laterality: N/A;   TONSILLECTOMY     Social History   Social History Narrative   Not on file   Immunization History  Administered Date(s) Administered   Influenza,inj,Quad PF,6+ Mos 05/16/2017, 05/15/2018   Moderna Sars-Covid-2 Vaccination 11/16/2019, 12/14/2019, 08/11/2020   Tdap  06/26/2018   Zoster Recombinat (Shingrix) 09/04/2017     Objective: Vital Signs: BP 129/83 (BP Location: Left Arm, Patient Position: Sitting, Cuff Size: Large)    Pulse 80    Ht 5\' 6"  (1.676 m)    Wt 194 lb 6.4 oz (88.2 kg)    BMI 31.38 kg/m    Physical Exam Vitals and nursing note reviewed.  Constitutional:      Appearance: She is well-developed.  HENT:     Head: Normocephalic and atraumatic.  Eyes:     Conjunctiva/sclera: Conjunctivae normal.  Cardiovascular:     Rate and Rhythm: Normal rate and regular rhythm.     Heart sounds: Normal heart sounds.  Pulmonary:     Effort: Pulmonary effort is normal.     Breath sounds: Normal breath sounds.  Abdominal:     General: Bowel sounds are normal.     Palpations: Abdomen is soft.  Musculoskeletal:     Cervical back: Normal range of motion.  Lymphadenopathy:     Cervical: No cervical adenopathy.  Skin:    General: Skin is warm and dry.     Capillary Refill: Capillary refill takes less than 2 seconds.  Neurological:     Mental Status: She is alert and oriented to person, place, and time.  Psychiatric:        Behavior: Behavior normal.     Musculoskeletal Exam: C-spine was in good range of motion.  Shoulder joints and elbow joints with good range of motion.  There was good range of motion of the wrist joints MCPs PIPs and DIPs with no synovitis.  Hip joints, knee joints, ankles, MTPs and PIPs with good range of motion with no synovitis.  CDAI Exam: CDAI Score: 0.9  Patient Global: 8 mm; Provider Global: 1 mm Swollen: 0 ; Tender: 0  Joint Exam 10/31/2021   No joint exam has been documented for this visit   There is currently no information documented on the homunculus. Go to the Rheumatology activity and complete the homunculus joint exam.  Investigation: No additional findings.  Imaging: No results found.  Recent Labs: Lab Results  Component Value Date   WBC 8.5 12/09/2019   HGB 15.3 12/09/2019   PLT 201 12/09/2019    NA 143 12/09/2019   K 3.7 12/09/2019   CL 104 12/09/2019   CO2 30 12/09/2019   GLUCOSE 93 12/09/2019   BUN 11 12/09/2019   CREATININE 0.82 12/09/2019   BILITOT 0.4 12/09/2019   ALKPHOS 69 04/29/2017   AST 22 12/09/2019   ALT 29 12/09/2019   PROT 6.2 12/09/2019   ALBUMIN 4.2 04/29/2017   CALCIUM 9.4 12/09/2019   GFRAA 91 12/09/2019   QFTBGOLDPLUS NEGATIVE 12/09/2019    Speciality Comments: Need Hep C quant every 6 months Prior therapy: methotrexate (oral ulcers) and Cimzia (inadequate response) , Enbrel stoped Jan 2023 Humira started 09/27/21  Procedures:  No procedures performed Allergies: Patient has no known allergies.   Assessment / Plan:  Visit Diagnoses: Rheumatoid arthritis with rheumatoid factor of multiple sites without organ or systems involvement ([place]) - Prior therapy: Actemra- inadequate response, methotrexate (oral ulcers) and Cimzia (inadequate response), enbrel (inadequate response).  She was started on Humira on September 27, 2021.  Patient states she has been taking it on a regular basis now.  She has had 2 courses of prednisone since she started Humira.  She states as soon as she tapers prednisone all her swelling comes back.  She called last week to start on prednisone again.  She was advised to come in the office today.  No synovitis was noted on the examination.  I had a detailed discussion with the patient regarding the side effects of prednisone.  She states she has been taking ibuprofen.  Avoidance of ibuprofen was discussed.  I offered referral to pain management which she declined.  Advised to take Cymbalta on a regular basis and discuss pain management with her PCP.  High risk medication use - humira 40mg  subcu every 14 days and Arava 20 mg daily.  Her labs the past 2.  She states she will obtain labs in [place] today.  She is advised to get labs every 3 months.  Information regarding mineralization was placed in the AVS.  She was advised to hold Arava and  Humira in case she develops an infection and resume after the infection resolves.  Annual skin examination to screen for skin cancer was also advised.  Patient has an appointment with the dermatologist in April.  Chronic left shoulder pain - Resolved.  Primary osteoarthritis of both hands-she has underlying osteoarthritis in her hands which causes discomfort.  She had good fist formation with no synovitis on examination today.  Primary osteoarthritis of both feet-she had no tenderness over her feet.  ANA positive - She has no clinical features of systemic lupus.  Trochanteric bursitis of both hips - Resolved.  She has intermittent discomfort.  IT band stretches were discussed.  Former smoker  History of seizures  History of hepatitis C - HCV quant negative on 04/07/19.  We will get hep C quant.  History of COPD  Orders: No orders of the defined types were placed in this encounter.  Meds ordered this encounter  Medications   leflunomide (ARAVA) 20 MG tablet    Sig: Take 1 tablet (20 mg total) by mouth daily.    Dispense:  90 tablet    Refill:  0     Follow-Up Instructions: Return in about 3 months (around 01/28/2022) for Rheumatoid arthritis.   Bo Merino, MD  Note - This record has been created using [employer].  Chart creation errors have been sought, but may not always  have been located. Such creation errors do not reflect on  the standard of medical care.

## 2021-10-31 ENCOUNTER — Other Ambulatory Visit: Payer: Self-pay

## 2021-10-31 ENCOUNTER — Ambulatory Visit (INDEPENDENT_AMBULATORY_CARE_PROVIDER_SITE_OTHER): Payer: Medicare Other | Admitting: Rheumatology

## 2021-10-31 ENCOUNTER — Encounter: Payer: Self-pay | Admitting: Rheumatology

## 2021-10-31 VITALS — BP 129/83 | HR 80 | Ht 66.0 in | Wt 194.4 lb

## 2021-10-31 DIAGNOSIS — M7061 Trochanteric bursitis, right hip: Secondary | ICD-10-CM

## 2021-10-31 DIAGNOSIS — Z79899 Other long term (current) drug therapy: Secondary | ICD-10-CM | POA: Diagnosis not present

## 2021-10-31 DIAGNOSIS — M19071 Primary osteoarthritis, right ankle and foot: Secondary | ICD-10-CM | POA: Diagnosis not present

## 2021-10-31 DIAGNOSIS — Z8709 Personal history of other diseases of the respiratory system: Secondary | ICD-10-CM

## 2021-10-31 DIAGNOSIS — Z87898 Personal history of other specified conditions: Secondary | ICD-10-CM

## 2021-10-31 DIAGNOSIS — M7062 Trochanteric bursitis, left hip: Secondary | ICD-10-CM

## 2021-10-31 DIAGNOSIS — M0579 Rheumatoid arthritis with rheumatoid factor of multiple sites without organ or systems involvement: Secondary | ICD-10-CM

## 2021-10-31 DIAGNOSIS — M19072 Primary osteoarthritis, left ankle and foot: Secondary | ICD-10-CM

## 2021-10-31 DIAGNOSIS — M19042 Primary osteoarthritis, left hand: Secondary | ICD-10-CM

## 2021-10-31 DIAGNOSIS — M19041 Primary osteoarthritis, right hand: Secondary | ICD-10-CM

## 2021-10-31 DIAGNOSIS — G8929 Other chronic pain: Secondary | ICD-10-CM

## 2021-10-31 DIAGNOSIS — R768 Other specified abnormal immunological findings in serum: Secondary | ICD-10-CM

## 2021-10-31 DIAGNOSIS — Z87891 Personal history of nicotine dependence: Secondary | ICD-10-CM

## 2021-10-31 DIAGNOSIS — Z8619 Personal history of other infectious and parasitic diseases: Secondary | ICD-10-CM

## 2021-10-31 MED ORDER — LEFLUNOMIDE 20 MG PO TABS: 20.0000 mg | ORAL_TABLET | Freq: Every day | ORAL | 0 refills | Status: DC

## 2021-10-31 NOTE — Patient Instructions (Signed)
Standing Labs °We placed an order today for your standing lab work.  ° °Please have your standing labs drawn in February and every 3 months ° °If possible, please have your labs drawn 2 weeks prior to your appointment so that the provider can discuss your results at your appointment. ° °Please note that you may see your imaging and lab results in MyChart before we have reviewed them. °We may be awaiting multiple results to interpret others before contacting you. °Please allow our office up to 72 hours to thoroughly review all of the results before contacting the office for clarification of your results. ° °We have open lab daily: °Monday through Thursday from 1:30-4:30 PM and Friday from 1:30-4:00 PM °at the office of Dr. Mendel Binsfeld, [facility].   °Please be advised, all patients with office appointments requiring lab work will take precedent over walk-in lab work.  °If possible, please come for your lab work on Monday and Friday afternoons, as you may experience shorter wait times. °The office is located at [address], [address], [place], [place] [address] °No appointment is necessary.   °Labs are drawn by [facility]. Please bring your co-pay at the time of your lab draw.  You may receive a bill from [facility] for your lab work. ° °Please note if you are on Hydroxychloroquine and and an order has been placed for a Hydroxychloroquine level, you will need to have it drawn 4 hours or more after your last dose. ° °If you wish to have your labs drawn at another location, please call the office 24 hours in advance to send orders. ° °If you have any questions regarding directions or hours of operation,  °please call [phone].   °As a reminder, please drink plenty of water prior to coming for your lab work. Thanks!  °Vaccines °You are taking a medication(s) that can suppress your immune system.  The following immunizations are recommended: °Flu annually °Covid-19  °Td/Tdap (tetanus, diphtheria,  pertussis) every 10 years °Pneumonia (Prevnar 15 then Pneumovax 23 at least 1 year apart.  Alternatively, can take Prevnar 20 without needing additional dose) °Shingrix: 2 doses from 4 weeks to 6 months apart ° °Please check with your PCP to make sure you are up to date.  ° °If you have signs or symptoms of an infection or start antibiotics: °First, call your PCP for workup of your infection. °Hold your medication through the infection, until you complete your antibiotics, and until symptoms resolve if you take the following: °Injectable medication (Actemra, Benlysta, Cimzia, Cosentyx, Enbrel, Humira, Kevzara, Orencia, Remicade, Simponi, Stelara, Taltz, Tremfya) °Methotrexate °Leflunomide (Arava) °Mycophenolate (Cellcept) °Xeljanz, Olumiant, or Rinvoq  °

## 2021-11-07 ENCOUNTER — Telehealth: Payer: Self-pay | Admitting: *Deleted

## 2021-11-07 NOTE — Telephone Encounter (Signed)
Labs received from:[facility] ? ?Drawn on:11/01/2021 ? ?Reviewed by:Hazel Sams, PA-C ? ?Labs drawn:CBC/CMP ? ?Results:Glucose 105 ? Albumin 4.9 ? ?Patient is on Arava 20 mg po daily and Humira 40 mg SQ every 14 days.   ?

## 2021-11-08 ENCOUNTER — Ambulatory Visit: Payer: Medicare Other | Admitting: Physician Assistant

## 2021-11-08 DIAGNOSIS — R768 Other specified abnormal immunological findings in serum: Secondary | ICD-10-CM

## 2021-11-08 DIAGNOSIS — Z87891 Personal history of nicotine dependence: Secondary | ICD-10-CM

## 2021-11-08 DIAGNOSIS — M0579 Rheumatoid arthritis with rheumatoid factor of multiple sites without organ or systems involvement: Secondary | ICD-10-CM

## 2021-11-08 DIAGNOSIS — Z79899 Other long term (current) drug therapy: Secondary | ICD-10-CM

## 2021-11-08 DIAGNOSIS — Z87898 Personal history of other specified conditions: Secondary | ICD-10-CM

## 2021-11-08 DIAGNOSIS — Z8709 Personal history of other diseases of the respiratory system: Secondary | ICD-10-CM

## 2021-11-08 DIAGNOSIS — Z8619 Personal history of other infectious and parasitic diseases: Secondary | ICD-10-CM

## 2021-11-08 DIAGNOSIS — M7061 Trochanteric bursitis, right hip: Secondary | ICD-10-CM

## 2021-11-08 DIAGNOSIS — M19071 Primary osteoarthritis, right ankle and foot: Secondary | ICD-10-CM

## 2021-11-08 DIAGNOSIS — M19041 Primary osteoarthritis, right hand: Secondary | ICD-10-CM

## 2021-11-08 DIAGNOSIS — G8929 Other chronic pain: Secondary | ICD-10-CM

## 2021-11-09 ENCOUNTER — Other Ambulatory Visit: Payer: Self-pay | Admitting: Physician Assistant

## 2021-11-10 ENCOUNTER — Other Ambulatory Visit: Payer: Self-pay | Admitting: Physician Assistant

## 2021-12-07 ENCOUNTER — Other Ambulatory Visit: Payer: Self-pay | Admitting: *Deleted

## 2021-12-07 DIAGNOSIS — M0579 Rheumatoid arthritis with rheumatoid factor of multiple sites without organ or systems involvement: Secondary | ICD-10-CM

## 2021-12-07 DIAGNOSIS — Z79899 Other long term (current) drug therapy: Secondary | ICD-10-CM

## 2021-12-07 MED ORDER — HUMIRA (2 PEN) 40 MG/0.4ML ~~LOC~~ AJKT: 40.0000 mg | AUTO-INJECTOR | SUBCUTANEOUS | 0 refills | Status: DC

## 2021-12-07 NOTE — Telephone Encounter (Signed)
Refill request received via fax ? ?Next Visit: 01/31/2022 ? ?Last Visit: 10/31/2021 ? ?Last Fill: 09/27/2021 ? ?[id number]:Rheumatoid arthritis with rheumatoid factor of multiple sites without organ or systems involvement  ? ?Current Dose per office note 10/31/2021: humira 40mg  subcu every 14 days  ? ?Labs: 11/01/2021 Glucose 105 Albumin 4.9 ? ?TB Gold: negative on 12/14/20  ? ?Okay to refill Humira?  ?

## 2021-12-20 MED ORDER — PREDNISONE 5 MG PO TABS: 5.0000 mg | ORAL_TABLET | Freq: Every day | ORAL | 1 refills | Status: DC

## 2021-12-20 NOTE — Telephone Encounter (Signed)
Please advise patient to stay on prednisone 5 mg p.o. daily until the follow-up visit.  Please call in prednisone 5 mg tablet 1 tablet p.o. daily.  Total 30 tablets with 1 refill.  Please advise that prolonged use of prednisone causes hypertension, diabetes, osteoporosis and increased risk of heart disease.

## 2022-01-17 NOTE — Progress Notes (Unsigned)
Office Visit Note  Patient: Christina Jenkins             Date of Birth: 03-23-1961           MRN: [id number]             PCP: Jalene Mullet, PA-C Referring: Jalene Mullet, PA-C Visit Date: 01/31/2022 Occupation: '@GUAROCC' @  Subjective:  Pain in both hands   History of Present Illness: Christina Jenkins is a 61 y.o. female with history of seropositive rheumatoid arthritis.  She remains on humira 40 mg sq injections every 14 days and arava 20 mg 1 tablet by mouth daily.  Her most recent Humira dose was administered today.  She has not had any side effects or injection site reactions.  She has not missed any doses of Humira or arava recently.  She continues to have chronic pain in both hands.  Her joint stiffness has been lasting several hours.  She has been taking ibuprofen 400 mg 2-3 times per day for symptomatic relief.  She is also taking prednisone 5 mg daily.  She would like a referral to pain management as previously discussed with Dr. Estanislado Pandy.  She states for the past 1 week she has been experiencing increased pain in both hips and her lower back.  She denies any groin pain.  She denies any symptoms of sciatica.  She has not had any injury or fall.  She denies any change in activity.  She has not had any pain at night. She denies any new medical conditions.  She denies any recent infections.    Activities of Daily Living:  Patient reports morning stiffness for several hours.   Patient Denies nocturnal pain.  Difficulty dressing/grooming: Denies Difficulty climbing stairs: Reports Difficulty getting out of chair: Reports Difficulty using hands for taps, buttons, cutlery, and/or writing: Reports  Review of Systems  Constitutional:  Positive for fatigue.  HENT:  Positive for mouth sores. Negative for mouth dryness and nose dryness.   Eyes:  Negative for pain, itching and dryness.  Respiratory:  Negative for shortness of breath and difficulty breathing.   Cardiovascular:  Negative for  chest pain and palpitations.  Gastrointestinal:  Negative for blood in stool, constipation and diarrhea.  Endocrine: Negative for increased urination.  Genitourinary:  Negative for difficulty urinating.  Musculoskeletal:  Positive for joint pain, joint pain, joint swelling, myalgias, morning stiffness, muscle tenderness and myalgias.  Skin:  Negative for color change, rash and redness.  Allergic/Immunologic: Negative for susceptible to infections.  Neurological:  Positive for numbness. Negative for dizziness, headaches, memory loss and weakness.  Hematological:  Positive for bruising/bleeding tendency.  Psychiatric/Behavioral:  Negative for confusion.    PMFS History:  Patient Active Problem List   Diagnosis Date Noted   Chest pain of uncertain etiology 77/82/4235   Abnormal nuclear stress test    History of COPD 05/31/2017   Fatigue 12/04/2016   History of seizures 10/23/2016   History of hepatitis C 10/23/2016   ANA positive 10/23/2016   Sicca syndrome, unspecified 10/23/2016   Seropositive rheumatoid arthritis ([place]) 08/01/2016   High risk medication use 08/01/2016   Hepatitis C 08/01/2016   Tobacco abuse 08/01/2016   Depression 08/01/2016   Complex partial seizure ([place]) 02/02/2016   MCI (mild cognitive impairment) 11/02/2015   Arthralgia 11/02/2015   Carpal tunnel syndrome 10/20/2015   Syncope 05/17/2015   Convulsions/seizures ([place]) 05/17/2015    Past Medical History:  Diagnosis Date   Arthritis 02/01/16  dx w/rheumatoid arthritis   Carpal tunnel syndrome 10/20/2015   right   Carpal tunnel syndrome on both sides    Dysuria    Hypokalemia    Liver disease    hepatitis c   Seasonal allergies    Seizures ([place])    last 05/05/15    Family History  Problem Relation Age of Onset   Pancreatic cancer Mother    COPD Father    Diabetes Brother    Past Surgical History:  Procedure Laterality Date   LEFT HEART CATH AND CORONARY ANGIOGRAPHY N/A 04/30/2019   Procedure: LEFT  HEART CATH AND CORONARY ANGIOGRAPHY;  Surgeon: Lorretta Harp, MD;  Location: [facility];  Service: Cardiovascular;  Laterality: N/A;   TONSILLECTOMY     Social History   Social History Narrative   Not on file   Immunization History  Administered Date(s) Administered   Influenza,inj,Quad PF,6+ Mos 05/16/2017, 05/15/2018   Moderna Sars-Covid-2 Vaccination 11/16/2019, 12/14/2019, 08/11/2020   Tdap 06/26/2018   Zoster Recombinat (Shingrix) 09/04/2017     Objective: Vital Signs: BP 111/75 (BP Location: Left Arm, Patient Position: Sitting, Cuff Size: Large)   Pulse 85   Ht '5\' 6"'  (1.676 m)   Wt 211 lb 3.2 oz (95.8 kg)   BMI 34.09 kg/m    Physical Exam Vitals and nursing note reviewed.  Constitutional:      Appearance: She is well-developed.  HENT:     Head: Normocephalic and atraumatic.  Eyes:     Conjunctiva/sclera: Conjunctivae normal.  Cardiovascular:     Rate and Rhythm: Normal rate and regular rhythm.     Heart sounds: Normal heart sounds.  Pulmonary:     Effort: Pulmonary effort is normal.     Breath sounds: Normal breath sounds.  Abdominal:     General: Bowel sounds are normal.     Palpations: Abdomen is soft.  Musculoskeletal:     Cervical back: Normal range of motion.  Skin:    General: Skin is warm and dry.     Capillary Refill: Capillary refill takes less than 2 seconds.  Neurological:     Mental Status: She is alert and oriented to person, place, and time.  Psychiatric:        Behavior: Behavior normal.     Musculoskeletal Exam: C-spine has good range of motion with no discomfort.  Tenderness palpation over both SI joints.  No midline spinal tenderness.  Shoulder joints, elbow joints, wrist joints, MCPs, PIPs, DIPs have good range of motion with no synovitis.  PIP and DIP prominence consistent with osteoarthritis of both hands.  No tenderness or synovitis over MCP joints.  Complete fist formation bilaterally.  Hip joints have good range of motion  with no groin pain.  Tenderness palpation over bilateral trochanteric bursa.  Knee joints have good range of motion with no warmth or effusion.  Ankle joints have good range of motion with no tenderness or joint swelling.  CDAI Exam: CDAI Score: 0.7  Patient Global: 6 mm; Provider Global: 1 mm Swollen: 0 ; Tender: 0  Joint Exam 01/31/2022   No joint exam has been documented for this visit   There is currently no information documented on the homunculus. Go to the Rheumatology activity and complete the homunculus joint exam.  Investigation: No additional findings.  Imaging: No results found.  Recent Labs: Lab Results  Component Value Date   WBC 8.5 12/09/2019   HGB 15.3 12/09/2019   PLT 201 12/09/2019   NA  143 12/09/2019   K 3.7 12/09/2019   CL 104 12/09/2019   CO2 30 12/09/2019   GLUCOSE 93 12/09/2019   BUN 11 12/09/2019   CREATININE 0.82 12/09/2019   BILITOT 0.4 12/09/2019   ALKPHOS 69 04/29/2017   AST 22 12/09/2019   ALT 29 12/09/2019   PROT 6.2 12/09/2019   ALBUMIN 4.2 04/29/2017   CALCIUM 9.4 12/09/2019   GFRAA 91 12/09/2019   QFTBGOLDPLUS NEGATIVE 12/09/2019    Speciality Comments: Need Hep C quant every 6 months Prior therapy: methotrexate (oral ulcers) and Cimzia (inadequate response) , Enbrel stoped Jan 2023 Humira started 09/27/21  Procedures:  No procedures performed Allergies: Patient has no known allergies.    Assessment / Plan:     Visit Diagnoses: Rheumatoid arthritis with rheumatoid factor of multiple sites without organ or systems involvement ([place]) - She has no synovitis on examination today.  She has no clinical features of active rheumatoid arthritis at this time.  Her rheumatoid arthritis is currently well controlled on Humira 40 mg subcutaneous injections every 14 days and Arava 20 mg 1 tablet by mouth daily.  She is tolerating both medications without any side effects or injection site reactions.  Her most recent dose of Humira was administered  today.  She was started on prednisone 5 mg 1 tablet daily in April 2023 due to inadequate management of her joint pain and joint stiffness.  She has also been taking ibuprofen 400 mg 2-3 times per day.  She has noticed some improvement since taking prednisone on a daily basis but requested to increase the prednisone dose further to 10 mg daily.  I discussed the risks of long-term prednisone use especially over 7.5 mg daily.  She was advised to remain on prednisone 5 mg 1 tablet daily.  She is aware of the possible risks and adverse effects taking prednisone long-term.  She was advised to avoid the use of NSAIDs while taking prednisone.  A referral to pain management will be placed today to discuss other treatment options to adequately manage her pain level. She will remain on Humira and Arava as combination therapy.  Refills of Arava, Humira, prednisone were sent to the pharmacy today.  She will notify us if she develops increased joint inflammation.  She will follow-up in the office in 5 months or sooner if needed.  High risk medication use - Humira 40 mg subcutaneous injections every 14 days and Arava 20 mg 1 tablet by mouth daily.  Humira was started on 09/27/2021.  Prednisone 5 mg daily.  CBC and CMP updated on 11/01/21.  CMP updated on 12/19/2021: AST and ALT within normal limits, creatinine 0.73 and EGFR was 94.  - Plan: QuantiFERON-TB Gold Plus, CBC with Differential/Platelet Lipid panel updated on 12/19/2021: Total cholesterol 152, triglycerides 194, HDL 45. TB gold negative on 12/14/20.  Order for TB Gold released today.  Discussed the importance of holding Humira and leflunomide if she develops signs or symptoms of an infection and to resume once the infection has completely cleared. Screening for tuberculosis -Future order for TB Gold placed today. plan: QuantiFERON-TB Gold Plus Previous therapy includes methotrexate-oral ulcers, Cimzia-inadequate response, Actemra -inadequate response, and Enbrel  inadequate response  Chronic left shoulder pain - Resolved.  She has good ROM with no discomfort or tenderness.    Primary osteoarthritis of both hands: She has been having DIP thickening assessment osteoarthritis of both hands.  No inflammation was noted on examination today.  Complete fist formation bilaterally.  She continues  to have chronic pain in both hands despite her rheumatoid arthritis being well controlled on the current treatment regimen.  Discussed the importance of joint protection and muscle strengthening. She would like to remain on long-term prednisone 5 mg daily.  She was advised to avoid the use of NSAIDs while taking long-term prednisone.  She has been taking ibuprofen 400 mg 2-3 times per day for symptomatic relief.  Referral to pain management will be placed today for further evaluation and management.  Primary osteoarthritis of both feet: She is not experiencing any increased discomfort in her feet at this time.  She has good range of motion of both ankle joints with no tenderness or joint swelling.  ANA positive - She has no clinical features of systemic lupus.  Trochanteric bursitis of both hips - She has tenderness palpation over bilateral trochanteric bursa.  Discussed that she will benefit from physical therapy and was advised to notify us when and if she would like [place] to place a referral in the future.  She was encouraged to perform stretching exercises daily.  Other medical conditions are listed as follows:  Former smoker  History of seizures  History of hepatitis C - HCV quant nonreactive on 11/03/21.   History of COPD   Orders: Orders Placed This Encounter  Procedures   QuantiFERON-TB Gold Plus   CBC with Differential/Platelet   Ambulatory referral to Physical Medicine Rehab   Meds ordered this encounter  Medications   predniSONE (DELTASONE) 5 MG tablet    Sig: Take 1 tablet (5 mg total) by mouth daily with breakfast.    Dispense:  30 tablet     Refill:  1   Adalimumab (HUMIRA PEN) 40 MG/0.4ML PNKT    Sig: Inject 40 mg into the skin every 14 (fourteen) days. 1 kit - 2 pens    Dispense:  6 each    Refill:  0   leflunomide (ARAVA) 20 MG tablet    Sig: Take 1 tablet (20 mg total) by mouth daily.    Dispense:  90 tablet    Refill:  0   Follow-Up Instructions: Return in about 5 months (around 07/03/2022) for Rheumatoid arthritis.   Ofilia Neas, PA-C  Note - This record has been created using Dragon software.  Chart creation errors have been sought, but may not always  have been located. Such creation errors do not reflect on  the standard of medical care.

## 2022-01-18 ENCOUNTER — Other Ambulatory Visit: Payer: Self-pay | Admitting: Rheumatology

## 2022-01-31 ENCOUNTER — Ambulatory Visit (INDEPENDENT_AMBULATORY_CARE_PROVIDER_SITE_OTHER): Payer: Medicare Other | Admitting: Physician Assistant

## 2022-01-31 ENCOUNTER — Encounter: Payer: Self-pay | Admitting: Physician Assistant

## 2022-01-31 VITALS — BP 111/75 | HR 85 | Ht 66.0 in | Wt 211.2 lb

## 2022-01-31 DIAGNOSIS — Z79899 Other long term (current) drug therapy: Secondary | ICD-10-CM | POA: Diagnosis not present

## 2022-01-31 DIAGNOSIS — M0579 Rheumatoid arthritis with rheumatoid factor of multiple sites without organ or systems involvement: Secondary | ICD-10-CM | POA: Diagnosis not present

## 2022-01-31 DIAGNOSIS — M7062 Trochanteric bursitis, left hip: Secondary | ICD-10-CM

## 2022-01-31 DIAGNOSIS — G8929 Other chronic pain: Secondary | ICD-10-CM

## 2022-01-31 DIAGNOSIS — M19071 Primary osteoarthritis, right ankle and foot: Secondary | ICD-10-CM

## 2022-01-31 DIAGNOSIS — M19042 Primary osteoarthritis, left hand: Secondary | ICD-10-CM

## 2022-01-31 DIAGNOSIS — Z8619 Personal history of other infectious and parasitic diseases: Secondary | ICD-10-CM

## 2022-01-31 DIAGNOSIS — Z87898 Personal history of other specified conditions: Secondary | ICD-10-CM

## 2022-01-31 DIAGNOSIS — Z111 Encounter for screening for respiratory tuberculosis: Secondary | ICD-10-CM

## 2022-01-31 DIAGNOSIS — R768 Other specified abnormal immunological findings in serum: Secondary | ICD-10-CM

## 2022-01-31 DIAGNOSIS — M19041 Primary osteoarthritis, right hand: Secondary | ICD-10-CM | POA: Diagnosis not present

## 2022-01-31 DIAGNOSIS — Z87891 Personal history of nicotine dependence: Secondary | ICD-10-CM

## 2022-01-31 DIAGNOSIS — M25512 Pain in left shoulder: Secondary | ICD-10-CM | POA: Diagnosis not present

## 2022-01-31 DIAGNOSIS — M7061 Trochanteric bursitis, right hip: Secondary | ICD-10-CM

## 2022-01-31 DIAGNOSIS — M19072 Primary osteoarthritis, left ankle and foot: Secondary | ICD-10-CM

## 2022-01-31 DIAGNOSIS — Z8709 Personal history of other diseases of the respiratory system: Secondary | ICD-10-CM

## 2022-01-31 MED ORDER — HUMIRA (2 PEN) 40 MG/0.4ML ~~LOC~~ AJKT: 40.0000 mg | AUTO-INJECTOR | SUBCUTANEOUS | 0 refills | Status: DC

## 2022-01-31 MED ORDER — LEFLUNOMIDE 20 MG PO TABS: 20.0000 mg | ORAL_TABLET | Freq: Every day | ORAL | 0 refills | Status: DC

## 2022-01-31 MED ORDER — PREDNISONE 5 MG PO TABS: 5.0000 mg | ORAL_TABLET | Freq: Every day | ORAL | 1 refills | Status: DC

## 2022-01-31 NOTE — Patient Instructions (Signed)
Standing Labs ?We placed an order today for your standing lab work.  ? ?Please have your standing labs drawn in August and every 3 months  ? ?If possible, please have your labs drawn 2 weeks prior to your appointment so that the provider can discuss your results at your appointment. ? ?Please note that you may see your imaging and lab results in MyChart before we have reviewed them. ?We may be awaiting multiple results to interpret others before contacting you. ?Please allow our office up to 72 hours to thoroughly review all of the results before contacting the office for clarification of your results. ? ?We have open lab daily: ?Monday through Thursday from 1:30-4:30 PM and Friday from 1:30-4:00 PM ?at the office of Dr. Shaili Deveshwar, [facility].   ?Please be advised, all patients with office appointments requiring lab work will take precedent over walk-in lab work.  ?If possible, please come for your lab work on Monday and Friday afternoons, as you may experience shorter wait times. ?The office is located at [address], [address], [place],  [address] ?No appointment is necessary.   ?Labs are drawn by [facility]. Please bring your co-pay at the time of your lab draw.  You may receive a bill from [facility] for your lab work. ? ?Please note if you are on Hydroxychloroquine and and an order has been placed for a Hydroxychloroquine level, you will need to have it drawn 4 hours or more after your last dose. ? ?If you wish to have your labs drawn at another location, please call the office 24 hours in advance to send orders. ? ?If you have any questions regarding directions or hours of operation,  ?please call [phone].   ?As a reminder, please drink plenty of water prior to coming for your lab work. Thanks! ? ?

## 2022-02-05 DIAGNOSIS — M130 Polyarthritis, unspecified: Secondary | ICD-10-CM | POA: Diagnosis not present

## 2022-02-05 DIAGNOSIS — Z79899 Other long term (current) drug therapy: Secondary | ICD-10-CM | POA: Diagnosis not present

## 2022-02-12 DIAGNOSIS — Z79899 Other long term (current) drug therapy: Secondary | ICD-10-CM | POA: Diagnosis not present

## 2022-02-23 ENCOUNTER — Telehealth: Payer: Self-pay | Admitting: *Deleted

## 2022-02-26 DIAGNOSIS — J209 Acute bronchitis, unspecified: Secondary | ICD-10-CM | POA: Diagnosis not present

## 2022-03-10 DIAGNOSIS — F17210 Nicotine dependence, cigarettes, uncomplicated: Secondary | ICD-10-CM | POA: Diagnosis not present

## 2022-03-10 DIAGNOSIS — J029 Acute pharyngitis, unspecified: Secondary | ICD-10-CM | POA: Diagnosis not present

## 2022-03-10 DIAGNOSIS — J019 Acute sinusitis, unspecified: Secondary | ICD-10-CM | POA: Diagnosis not present

## 2022-03-10 DIAGNOSIS — R030 Elevated blood-pressure reading, without diagnosis of hypertension: Secondary | ICD-10-CM | POA: Diagnosis not present

## 2022-03-10 DIAGNOSIS — J209 Acute bronchitis, unspecified: Secondary | ICD-10-CM | POA: Diagnosis not present

## 2022-03-13 DIAGNOSIS — E119 Type 2 diabetes mellitus without complications: Secondary | ICD-10-CM | POA: Diagnosis not present

## 2022-03-13 DIAGNOSIS — E876 Hypokalemia: Secondary | ICD-10-CM | POA: Diagnosis not present

## 2022-03-13 DIAGNOSIS — E782 Mixed hyperlipidemia: Secondary | ICD-10-CM | POA: Diagnosis not present

## 2022-03-13 DIAGNOSIS — R5383 Other fatigue: Secondary | ICD-10-CM | POA: Diagnosis not present

## 2022-03-13 DIAGNOSIS — M069 Rheumatoid arthritis, unspecified: Secondary | ICD-10-CM | POA: Diagnosis not present

## 2022-03-13 DIAGNOSIS — E7849 Other hyperlipidemia: Secondary | ICD-10-CM | POA: Diagnosis not present

## 2022-03-13 DIAGNOSIS — Z79899 Other long term (current) drug therapy: Secondary | ICD-10-CM | POA: Diagnosis not present

## 2022-03-13 DIAGNOSIS — I10 Essential (primary) hypertension: Secondary | ICD-10-CM | POA: Diagnosis not present

## 2022-03-15 DIAGNOSIS — E876 Hypokalemia: Secondary | ICD-10-CM | POA: Diagnosis not present

## 2022-03-15 DIAGNOSIS — E875 Hyperkalemia: Secondary | ICD-10-CM | POA: Diagnosis not present

## 2022-03-15 DIAGNOSIS — E119 Type 2 diabetes mellitus without complications: Secondary | ICD-10-CM | POA: Diagnosis not present

## 2022-03-15 DIAGNOSIS — R5383 Other fatigue: Secondary | ICD-10-CM | POA: Diagnosis not present

## 2022-03-28 DIAGNOSIS — R928 Other abnormal and inconclusive findings on diagnostic imaging of breast: Secondary | ICD-10-CM | POA: Diagnosis not present

## 2022-03-28 DIAGNOSIS — I8310 Varicose veins of unspecified lower extremity with inflammation: Secondary | ICD-10-CM | POA: Diagnosis not present

## 2022-03-28 DIAGNOSIS — E876 Hypokalemia: Secondary | ICD-10-CM | POA: Diagnosis not present

## 2022-03-28 DIAGNOSIS — J449 Chronic obstructive pulmonary disease, unspecified: Secondary | ICD-10-CM | POA: Diagnosis not present

## 2022-03-28 DIAGNOSIS — N6321 Unspecified lump in the left breast, upper outer quadrant: Secondary | ICD-10-CM | POA: Diagnosis not present

## 2022-03-28 DIAGNOSIS — R5383 Other fatigue: Secondary | ICD-10-CM | POA: Diagnosis not present

## 2022-03-28 DIAGNOSIS — E119 Type 2 diabetes mellitus without complications: Secondary | ICD-10-CM | POA: Diagnosis not present

## 2022-03-28 DIAGNOSIS — E7849 Other hyperlipidemia: Secondary | ICD-10-CM | POA: Diagnosis not present

## 2022-03-28 DIAGNOSIS — M069 Rheumatoid arthritis, unspecified: Secondary | ICD-10-CM | POA: Diagnosis not present

## 2022-03-28 DIAGNOSIS — I700 Atherosclerosis of aorta: Secondary | ICD-10-CM | POA: Diagnosis not present

## 2022-03-28 DIAGNOSIS — I872 Venous insufficiency (chronic) (peripheral): Secondary | ICD-10-CM | POA: Diagnosis not present

## 2022-03-28 DIAGNOSIS — J439 Emphysema, unspecified: Secondary | ICD-10-CM | POA: Diagnosis not present

## 2022-03-28 DIAGNOSIS — R030 Elevated blood-pressure reading, without diagnosis of hypertension: Secondary | ICD-10-CM | POA: Diagnosis not present

## 2022-03-29 ENCOUNTER — Other Ambulatory Visit: Payer: Self-pay | Admitting: Physician Assistant

## 2022-03-29 NOTE — Telephone Encounter (Signed)
Next Visit: 07/03/2022  Last Visit: 01/31/2022  Last Fill: 01/31/2022  Dx: Rheumatoid arthritis with rheumatoid factor of multiple sites without organ or systems involvement   Current Dose per office note on 01/31/2022: Prednisone 5 mg daily  Okay to refill Prednisone?

## 2022-04-07 ENCOUNTER — Encounter: Payer: Self-pay | Admitting: Cardiology

## 2022-04-07 NOTE — Progress Notes (Signed)
Cardiology Office Note  Date: 04/09/2022   ID: Xee, Hollman October 16, 1960, MRN [id number]  PCP:  Jalene Mullet, PA-C  Cardiologist:  Rozann Lesches, MD Electrophysiologist:  None   Chief Complaint  Patient presents with   Coronary artery calcification    History of Present Illness: Christina Jenkins is a 61 y.o. female referred back to the office by Ms. Luciana Axe PA-C due to findings of coronary artery calcification by CT imaging.  She is a former patient of Dr. Bronson Ing, last seen in November 2020 with cardiac testing discussed below.  Her husband is a patient of mine.  She underwent a diagnostic cardiac catheterization in August 2020 for further evaluation of abnormal Myoview findings.  Fortunately, the procedure showed normal coronary arteries with increased filling pressures and recommendation for medical therapy and most likely noncardiac cause of chest discomfort.  The preceding Myoview study was performed at [facility] and indicated apparent inferior wall ischemia, likely false positive result.  We discussed the results of her last two chest CT examinations done at [facility], both showing coronary atherosclerosis.  She does not report any angina symptoms and is on medical therapy including lisinopril and Crestor.  Her most recent LDL was 49.  I personally reviewed her ECG today which shows sinus rhythm with incomplete right bundle branch block, no significant changes compared to the prior tracing.  We discussed rationale for medical therapy and observation at this point.  Past Medical History:  Diagnosis Date   Carpal tunnel syndrome    COPD (chronic obstructive pulmonary disease) (HCC)    Coronary artery calcification seen on CT scan    Normal coronaries at cardiac catheterization 2020   Depression    Essential hypertension    Hepatitis C    Hypokalemia    Mixed hyperlipidemia    Rheumatoid arthritis (HCC)    Seasonal allergies    Type 2 diabetes mellitus ([place])     Varicose veins of legs     Past Surgical History:  Procedure Laterality Date   BACK SURGERY     LEFT HEART CATH AND CORONARY ANGIOGRAPHY N/A 04/30/2019   Procedure: LEFT HEART CATH AND CORONARY ANGIOGRAPHY;  Surgeon: Lorretta Harp, MD;  Location: [facility];  Service: Cardiovascular;  Laterality: N/A;   TONSILLECTOMY     TUBAL LIGATION      Current Outpatient Medications  Medication Sig Dispense Refill   Adalimumab (HUMIRA PEN) 40 MG/0.4ML PNKT Inject 40 mg into the skin every 14 (fourteen) days. 1 kit - 2 pens 6 each 0   ARIPiprazole (ABILIFY) 2 MG tablet Take 2 mg by mouth daily.     cetirizine (ZYRTEC) 10 MG tablet Take 10 mg by mouth daily.     DULoxetine (CYMBALTA) 60 MG capsule Take 60 mg by mouth 2 (two) times daily.     furosemide (LASIX) 20 MG tablet Take 20 mg by mouth 2 (two) times daily.     leflunomide (ARAVA) 20 MG tablet Take 1 tablet (20 mg total) by mouth daily. 90 tablet 0   levETIRAcetam (KEPPRA XR) 500 MG 24 hr tablet Take 1 tablet (500 mg total) by mouth daily. 30 tablet 0   lisinopril (ZESTRIL) 10 MG tablet Take 10 mg by mouth daily.     metFORMIN (GLUCOPHAGE-XR) 500 MG 24 hr tablet Take 500 mg by mouth 2 (two) times daily.     Multiple Vitamin (MULTIVITAMIN) tablet Take 1 tablet by mouth daily.  potassium chloride SA (K-DUR) 20 MEQ tablet Take 20 mEq by mouth daily.     rosuvastatin (CRESTOR) 20 MG tablet Take 20 mg by mouth daily.     Omega-3 Fatty Acids (FISH OIL) 1000 MG CAPS Take 1,000 mg by mouth daily.  (Patient not taking: Reported on 04/09/2022)     Current Facility-Administered Medications  Medication Dose Route Frequency Provider Last Rate Last Admin   sodium chloride flush (NS) 0.9 % injection 3 mL  3 mL Intravenous Q12H Herminio Commons, MD       Allergies:  Patient has no known allergies.   Social History: The patient  reports that she has been smoking cigarettes. She has been exposed to tobacco smoke. She has never used  smokeless tobacco. She reports that she does not drink alcohol and does not use drugs.   Family History: The patient's family history includes COPD in her father; Dementia in her father; Diabetes in her brother, father, and mother; Pancreatic cancer in her mother.   ROS: No palpitations or syncope.  Physical Exam: VS:  BP 115/78   Pulse 84   Ht '5\' 6"'  (1.676 m)   Wt 208 lb (94.3 kg)   SpO2 95%   BMI 33.57 kg/m , BMI Body mass index is 33.57 kg/m.  Wt Readings from Last 3 Encounters:  04/09/22 208 lb (94.3 kg)  01/31/22 211 lb 3.2 oz (95.8 kg)  10/31/21 194 lb 6.4 oz (88.2 kg)    General: Patient appears comfortable at rest. HEENT: Conjunctiva and lids normal, oropharynx clear. Neck: Supple, no elevated JVP or carotid bruits, no thyromegaly. Lungs: Clear to auscultation, nonlabored breathing at rest. Cardiac: Regular rate and rhythm, no S3 or significant systolic murmur, no pericardial rub. Abdomen: Soft, nontender, bowel sounds present. Extremities: No pitting edema, distal pulses 2+. Skin: Warm and dry. Musculoskeletal: No kyphosis. Neuropsychiatric: Alert and oriented x3, affect grossly appropriate.  ECG:  An ECG dated 04/01/2019 was personally reviewed today and demonstrated:  Sinus rhythm with right bundle branch block.  Recent Labwork:    Component Value Date/Time   CHOL 115 02/10/2019 1115   TRIG 126 02/10/2019 1115   HDL 45 (L) 02/10/2019 1115   CHOLHDL 2.6 02/10/2019 1115   VLDL 24 04/29/2017 1001   LDLCALC 49 02/10/2019 1115  July 2023: BUN 10, creatinine 0.72, potassium 3.7, AST 23, ALT 26, hemoglobin A1c 6.8%, cholesterol 133, HDL 37, triglycerides 234, LDL 49  Other Studies Reviewed Today:  Chest CT 07/24/2021 [facility] [place]): 1. Lung-RADS 2, benign appearance or behavior. Continue annual  screening with low-dose chest CT without contrast in 12 months.  2. Aortic Atherosclerosis (ICD10-I70.0) and Emphysema (ICD10-J43.9).  3. Age advanced coronary artery  atherosclerosis. Recommend  assessment of coronary risk factors and consideration of medical  therapy.    Assessment and Plan:  1.  Coronary artery calcification by CT imaging, last documented in November 2022.  Prior CT scan from 2021 showed similar findings.  Fortunately, she did undergo a diagnostic cardiac catheterization in 2020 which did not show any significant obstructive CAD.  ECG reviewed and stable today.  Would recommend medical therapy and observation at this time.  We did discuss warning signs and symptoms that would prompt further ischemic workup.  She is on Crestor with very good control of cholesterol, LDL 49.  2.  Mixed hyperlipidemia, LDL 49 on Crestor.  3.  Essential hypertension, blood pressure is normal today on lisinopril which was recently started by PCP.  4.  Type  2 diabetes mellitus, hemoglobin A1c 6.8%.  She is currently on Glucophage with follow-up by PCP.  Medication Adjustments/Labs and Tests Ordered: Current medicines are reviewed at length with the patient today.  Concerns regarding medicines are outlined above.   Tests Ordered: Orders Placed This Encounter  Procedures   EKG 12-Lead    Medication Changes: No orders of the defined types were placed in this encounter.   Disposition:  Follow up  1 year, sooner if needed.  Signed, Satira Sark, MD, [facility] 04/09/2022 9:02 AM    [facility] [address], [place], [place] [address] Phone: [phone]; Fax: [phone]

## 2022-04-09 ENCOUNTER — Ambulatory Visit (INDEPENDENT_AMBULATORY_CARE_PROVIDER_SITE_OTHER): Payer: Medicare Other | Admitting: Cardiology

## 2022-04-09 ENCOUNTER — Encounter: Payer: Self-pay | Admitting: Cardiology

## 2022-04-09 VITALS — BP 115/78 | HR 84 | Ht 66.0 in | Wt 208.0 lb

## 2022-04-09 DIAGNOSIS — E782 Mixed hyperlipidemia: Secondary | ICD-10-CM | POA: Diagnosis not present

## 2022-04-09 DIAGNOSIS — E119 Type 2 diabetes mellitus without complications: Secondary | ICD-10-CM | POA: Diagnosis not present

## 2022-04-09 DIAGNOSIS — I10 Essential (primary) hypertension: Secondary | ICD-10-CM

## 2022-04-09 DIAGNOSIS — I2510 Atherosclerotic heart disease of native coronary artery without angina pectoris: Secondary | ICD-10-CM | POA: Diagnosis not present

## 2022-04-09 NOTE — Patient Instructions (Signed)
Medication Instructions:  Your physician recommends that you continue on your current medications as directed. Please refer to the Current Medication list given to you today.   Labwork: none  Testing/Procedures: none  Follow-Up:  Your physician recommends that you schedule a follow-up appointment in: 1 year   Any Other Special Instructions Will Be Listed Below (If Applicable).  You will receive a call in about 10 months reminding you to schedule your appointment. If you do not receive this call, please contact our office.  If you need a refill on your cardiac medications before your next appointment, please call your pharmacy.  

## 2022-04-11 DIAGNOSIS — E875 Hyperkalemia: Secondary | ICD-10-CM | POA: Diagnosis not present

## 2022-05-01 ENCOUNTER — Other Ambulatory Visit: Payer: Self-pay | Admitting: Physician Assistant

## 2022-05-02 NOTE — Telephone Encounter (Signed)
Next Visit: 07/03/2022   Last Visit: 01/31/2022   Last Fill: 03/29/2022   Dx: Rheumatoid arthritis with rheumatoid factor of multiple sites without organ or systems involvement    Current Dose per office note on 01/31/2022: Prednisone 5 mg daily   Okay to refill Prednisone?

## 2022-05-22 ENCOUNTER — Other Ambulatory Visit: Payer: Self-pay | Admitting: *Deleted

## 2022-05-22 DIAGNOSIS — Z79899 Other long term (current) drug therapy: Secondary | ICD-10-CM

## 2022-05-22 MED ORDER — LEFLUNOMIDE 20 MG PO TABS: 20.0000 mg | ORAL_TABLET | Freq: Every day | ORAL | 0 refills | Status: DC

## 2022-05-22 NOTE — Telephone Encounter (Signed)
Refill request received via fax from [facility] for Christina Jenkins   Next Visit: 07/03/2022   Last Visit: 01/31/2022   Last Fill: 01/31/2022   Dx: Rheumatoid arthritis with rheumatoid factor of multiple sites without organ or systems involvement    Current Dose per office note on 01/31/2022: Arava 20 mg 1 tablet by mouth daily  Labs: 02/12/2022 CBC WNL  Patient advised she is due to update labs. Lab Orders faxed to PCP and she states she will have them drawn on Thursday.    Okay to refill Arava?

## 2022-05-25 DIAGNOSIS — Z79899 Other long term (current) drug therapy: Secondary | ICD-10-CM | POA: Diagnosis not present

## 2022-05-30 ENCOUNTER — Telehealth: Payer: Self-pay | Admitting: *Deleted

## 2022-05-30 ENCOUNTER — Encounter: Payer: Self-pay | Admitting: Rheumatology

## 2022-05-30 DIAGNOSIS — M0579 Rheumatoid arthritis with rheumatoid factor of multiple sites without organ or systems involvement: Secondary | ICD-10-CM

## 2022-05-30 DIAGNOSIS — Z79899 Other long term (current) drug therapy: Secondary | ICD-10-CM

## 2022-05-30 MED ORDER — HUMIRA (2 PEN) 40 MG/0.4ML ~~LOC~~ AJKT: 40.0000 mg | AUTO-INJECTOR | SUBCUTANEOUS | 0 refills | Status: DC

## 2022-05-30 NOTE — Telephone Encounter (Signed)
Next Visit: 07/03/2022  Last Visit: 01/31/2022  Last Fill: 01/31/2022  DX: Rheumatoid arthritis with rheumatoid factor of multiple sites without organ or systems involvement   Current Dose per office note 01/31/2022: Humira 40 mg subcutaneous injections every 14 days  Labs: 05/26/2022 Glucose 119 all other labs WNL  TB Gold: 02/12/2022 Neg   Okay to refill Humira?

## 2022-05-30 NOTE — Telephone Encounter (Signed)
Labs received from:[facility]   Drawn on:05/25/2022  Reviewed by:Dr. Bo Merino   Labs drawn:CBC, CMP  Results: Glucose 119, all other labs WNL

## 2022-06-18 ENCOUNTER — Other Ambulatory Visit: Payer: Self-pay | Admitting: Rheumatology

## 2022-06-18 NOTE — Telephone Encounter (Signed)
Next Visit: 07/03/2022  Last Visit: 01/31/2022  Last Fill: 05/22/2022  DX: Rheumatoid arthritis with rheumatoid factor of multiple sites without organ or systems involvement   Current Dose per office note 01/31/2022: Arava 20 mg 1 tablet by mouth daily  Labs received from:[facility]    Drawn on:05/25/2022   Reviewed by:Dr. Bo Merino    Labs drawn:CBC, CMP   Results: Glucose 119, all other labs WNL   Okay to refill Arava?

## 2022-06-19 MED ORDER — LEFLUNOMIDE 20 MG PO TABS: ORAL_TABLET | ORAL | 0 refills | Status: DC

## 2022-06-19 NOTE — Progress Notes (Signed)
Office Visit Note  Patient: Christina Jenkins             Date of Birth: 1961-01-02           MRN: [id number]             PCP: Jalene Mullet, PA-C Referring: Jalene Mullet, PA-C Visit Date: 07/03/2022 Occupation: _0 @  Subjective:  Frequent flares  History of Present Illness: Christina Jenkins is a 61 y.o. female with history of seropositive rheumatoid factor.  She states she has been having frequent flares of arthritis.  She states few weeks back she had pain and swelling in her right ankle which resolved by itself.  Then she had pain and swelling in her left knee joint a week ago which resolved by itself.  She states the pain was so severe that she was having difficulty walking.  Now her left shoulder joint is hurting.  She has been taking Humira 40 mg subcu every other week on a regular basis.  She also has been taking leflunomide 20 mg p.o. daily and prednisone 5 mg p.o. daily.  She denies any interruption in the treatment.  Activities of Daily Living:  Patient reports morning stiffness for all day. Patient Reports nocturnal pain.  Difficulty dressing/grooming: Denies Difficulty climbing stairs: Reports Difficulty getting out of chair: Denies Difficulty using hands for taps, buttons, cutlery, and/or writing: Reports  Review of Systems  Constitutional:  Negative for fatigue.  HENT:  Negative for mouth sores and mouth dryness.   Eyes:  Negative for dryness.  Respiratory:  Negative for shortness of breath.   Cardiovascular:  Negative for chest pain and palpitations.  Gastrointestinal:  Negative for blood in stool, constipation and diarrhea.  Endocrine: Negative for increased urination.  Genitourinary:  Negative for involuntary urination.  Musculoskeletal:  Positive for joint pain, joint pain, joint swelling and morning stiffness. Negative for gait problem, myalgias, muscle weakness, muscle tenderness and myalgias.  Skin:  Negative for color change, rash, hair loss and sensitivity to  sunlight.  Allergic/Immunologic: Negative for susceptible to infections.  Neurological:  Negative for dizziness and headaches.  Hematological:  Negative for swollen glands.  Psychiatric/Behavioral:  Positive for sleep disturbance. Negative for depressed mood. The patient is not nervous/anxious.     PMFS History:  Patient Active Problem List   Diagnosis Date Noted   Chest pain of uncertain etiology 64/33/2951   Abnormal nuclear stress test    History of COPD 05/31/2017   Fatigue 12/04/2016   History of seizures 10/23/2016   History of hepatitis C 10/23/2016   ANA positive 10/23/2016   Sicca syndrome, unspecified 10/23/2016   Seropositive rheumatoid arthritis ([place]) 08/01/2016   High risk medication use 08/01/2016   Hepatitis C 08/01/2016   Tobacco abuse 08/01/2016   Depression 08/01/2016   Complex partial seizure ([place]) 02/02/2016   MCI (mild cognitive impairment) 11/02/2015   Arthralgia 11/02/2015   Carpal tunnel syndrome 10/20/2015   Syncope 05/17/2015   Convulsions/seizures ([place]) 05/17/2015    Past Medical History:  Diagnosis Date   Carpal tunnel syndrome    COPD (chronic obstructive pulmonary disease) (HCC)    Coronary artery calcification seen on CT scan    Normal coronaries at cardiac catheterization 2020   Depression    Essential hypertension    Hepatitis C    Hypokalemia    Mixed hyperlipidemia    Rheumatoid arthritis (HCC)    Rheumatoid arthritis (HCC)    Seasonal allergies    Type 2  diabetes mellitus (HCC)    Varicose veins of legs     Family History  Problem Relation Age of Onset   Diabetes Mother    Pancreatic cancer Mother    Diabetes Father    COPD Father    Dementia Father    Diabetes Brother    Past Surgical History:  Procedure Laterality Date   BACK SURGERY     LEFT HEART CATH AND CORONARY ANGIOGRAPHY N/A 04/30/2019   Procedure: LEFT HEART CATH AND CORONARY ANGIOGRAPHY;  Surgeon: Lorretta Harp, MD;  Location: [facility];  Service:  Cardiovascular;  Laterality: N/A;   TONSILLECTOMY     TUBAL LIGATION     Social History   Social History Narrative   Not on file   Immunization History  Administered Date(s) Administered   Influenza,inj,Quad PF,6+ Mos 05/16/2017, 05/15/2018   Moderna Sars-Covid-2 Vaccination 11/16/2019, 12/14/2019, 08/11/2020   Tdap 06/26/2018   Zoster Recombinat (Shingrix) 09/04/2017     Objective: Vital Signs: BP 116/81 (BP Location: Left Arm, Patient Position: Sitting, Cuff Size: Normal)   Pulse 94   Resp 18   Ht 5' 6" (1.676 m)   Wt 204 lb (92.5 kg)   BMI 32.93 kg/m    Physical Exam Vitals and nursing note reviewed.  Constitutional:      Appearance: She is well-developed.  HENT:     Head: Normocephalic and atraumatic.  Eyes:     Conjunctiva/sclera: Conjunctivae normal.  Cardiovascular:     Rate and Rhythm: Normal rate and regular rhythm.     Heart sounds: Normal heart sounds.  Pulmonary:     Effort: Pulmonary effort is normal.     Breath sounds: Normal breath sounds.  Abdominal:     General: Bowel sounds are normal.     Palpations: Abdomen is soft.  Musculoskeletal:     Cervical back: Normal range of motion.  Lymphadenopathy:     Cervical: No cervical adenopathy.  Skin:    General: Skin is warm and dry.     Capillary Refill: Capillary refill takes less than 2 seconds.  Neurological:     Mental Status: She is alert and oriented to person, place, and time.  Psychiatric:        Behavior: Behavior normal.      Musculoskeletal Exam: Cervical spine was in good range of motion.  Shoulder joints were in good range of motion.  She had discomfort range of motion of her right shoulder joint.  Elbow joints, wrist joints, MCPs PIPs and DIPs with good range of motion with no synovitis.  She had bilateral PIP and DIP thickening.  Hip joints and knee joints were in good range of motion.  No warmth swelling or effusion was noted.  She had no tenderness over ankles or MTPs.  CDAI  Exam: CDAI Score: 2.1  Patient Global: 7 mm; Provider Global: 4 mm Swollen: 0 ; Tender: 1  Joint Exam 07/03/2022      Right  Left  Glenohumeral   Tender        Investigation: No additional findings.  Imaging: No results found.  Recent Labs: Lab Results  Component Value Date   WBC 8.5 12/09/2019   HGB 15.3 12/09/2019   PLT 201 12/09/2019   NA 143 12/09/2019   K 3.7 12/09/2019   CL 104 12/09/2019   CO2 30 12/09/2019   GLUCOSE 93 12/09/2019   BUN 11 12/09/2019   CREATININE 0.82 12/09/2019   BILITOT 0.4 12/09/2019   ALKPHOS 69  04/29/2017   AST 22 12/09/2019   ALT 29 12/09/2019   PROT 6.2 12/09/2019   ALBUMIN 4.2 04/29/2017   CALCIUM 9.4 12/09/2019   GFRAA 91 12/09/2019   QFTBGOLDPLUS NEGATIVE 12/09/2019    Speciality Comments: Need Hep C quant every 6 months Prior therapy: methotrexate (oral ulcers) and Cimzia (inadequate response) , Enbrel stopped Jan 2023 Humira started 09/27/21  Procedures:  No procedures performed Allergies: Patient has no known allergies.   Assessment / Plan:     Visit Diagnoses: Rheumatoid arthritis with rheumatoid factor of multiple sites without organ or systems involvement (HCC)-patient gives history of ongoing flares.  She had recent flare in her right ankle and her left knee joint which resolved by itself per patient.  She had been having discomfort in her right shoulder joint.  No warmth swelling or effusion was noted.  No synovitis was noted.  She has tried multiple medications in the past.  She is currently on Humira and Arava combination along with low-dose prednisone.  She denies any interruption in therapy.  I discussed the option of Xeljanz.  She will be high risk as she is over 29 and is a smoker.  At this point patient does not want to change her medications.  She lives in chronic pain.  I will refer her to pain management.  She was in agreement.  High risk medication use - Humira 40 mg subcutaneous every 14 days and Arava 20 mg 1  tablet by mouth daily.  Humira started on 09/27/2021.  Prednisone 5 mg daily.05/25/22 CBC and CMP were normal.  I advised her to get CBC, CMP, sed rate and hep C quant in December.  Her labs should be drawn every 3 months.  Information on immunization was also placed in the AVS.  She was advised to hold Humira and leflunomide if she develops an infection and resume after the infection resolves.  Annual skin examination to screen for skin cancer was advised.  Side effects of long-term use of prednisone were also discussed.  Long-term use of systemic steroids-patient has been taking low-dose prednisone for a long time.  Side effects of long-term use of prednisone including hypertension, weight gain, diabetes, hypercholesterolemia, increased risk of heart disease and osteoporosis were discussed.  DEXA scan was advised.  Patient will discuss with her PCP.  Calcium rich diet and vitamin D was advised.  Need for regular exercise was advised.  Chronic right shoulder pain -she has intermittent pain in her shoulders.  She is currently having right shoulder joint pain.  Primary osteoarthritis of both hands-she complete pain and discomfort in the bilateral hands.  I will refer her to pain management.  Primary osteoarthritis of both feet-she has chronic pain and discomfort in her feet.  She will be referred to the pain management.  ANA positive - She has no clinical features of systemic lupus.  Smoker-patient states that she smokes intermittently.  Association of smoking with rheumatoid arthritis was discussed.  Smoking cessation was advised.  History of COPD  History of hepatitis C - HCV quant nonreactive on 11/03/21.  I will recheck hepatitis C quant in December.  History of seizures  Orders: Orders Placed This Encounter  Procedures   CBC with Differential/Platelet   CMP14+EGFR   Sedimentation rate   HCV RNA quant   Ambulatory referral to Physical Medicine Rehab   Meds ordered this encounter   Medications   predniSONE (DELTASONE) 5 MG tablet    Sig: TAKE 1 TABLET(5 MG) BY MOUTH  DAILY WITH BREAKFAST    Dispense:  30 tablet    Refill:  2     Follow-Up Instructions: Return in about 3 months (around 10/03/2022) for Rheumatoid arthritis.   Bo Merino, MD  Note - This record has been created using Editor, commissioning.  Chart creation errors have been sought, but may not always  have been located. Such creation errors do not reflect on  the standard of medical care.

## 2022-06-19 NOTE — Addendum Note (Signed)
Addended by: Carole Binning on: 06/19/2022 09:14 AM   Modules accepted: Orders

## 2022-06-21 DIAGNOSIS — E7849 Other hyperlipidemia: Secondary | ICD-10-CM | POA: Diagnosis not present

## 2022-06-21 DIAGNOSIS — R739 Hyperglycemia, unspecified: Secondary | ICD-10-CM | POA: Diagnosis not present

## 2022-06-21 DIAGNOSIS — J449 Chronic obstructive pulmonary disease, unspecified: Secondary | ICD-10-CM | POA: Diagnosis not present

## 2022-06-21 DIAGNOSIS — F17200 Nicotine dependence, unspecified, uncomplicated: Secondary | ICD-10-CM | POA: Diagnosis not present

## 2022-06-21 DIAGNOSIS — E119 Type 2 diabetes mellitus without complications: Secondary | ICD-10-CM | POA: Diagnosis not present

## 2022-07-03 ENCOUNTER — Encounter: Payer: Self-pay | Admitting: Rheumatology

## 2022-07-03 ENCOUNTER — Ambulatory Visit: Payer: Medicare Other | Attending: Rheumatology | Admitting: Rheumatology

## 2022-07-03 VITALS — BP 116/81 | HR 94 | Resp 18 | Ht 66.0 in | Wt 204.0 lb

## 2022-07-03 DIAGNOSIS — M19042 Primary osteoarthritis, left hand: Secondary | ICD-10-CM

## 2022-07-03 DIAGNOSIS — M19071 Primary osteoarthritis, right ankle and foot: Secondary | ICD-10-CM | POA: Diagnosis not present

## 2022-07-03 DIAGNOSIS — M25511 Pain in right shoulder: Secondary | ICD-10-CM | POA: Diagnosis not present

## 2022-07-03 DIAGNOSIS — F17200 Nicotine dependence, unspecified, uncomplicated: Secondary | ICD-10-CM

## 2022-07-03 DIAGNOSIS — Z79899 Other long term (current) drug therapy: Secondary | ICD-10-CM

## 2022-07-03 DIAGNOSIS — Z8709 Personal history of other diseases of the respiratory system: Secondary | ICD-10-CM

## 2022-07-03 DIAGNOSIS — M0579 Rheumatoid arthritis with rheumatoid factor of multiple sites without organ or systems involvement: Secondary | ICD-10-CM

## 2022-07-03 DIAGNOSIS — Z87898 Personal history of other specified conditions: Secondary | ICD-10-CM | POA: Diagnosis not present

## 2022-07-03 DIAGNOSIS — Z8619 Personal history of other infectious and parasitic diseases: Secondary | ICD-10-CM

## 2022-07-03 DIAGNOSIS — Z7952 Long term (current) use of systemic steroids: Secondary | ICD-10-CM | POA: Diagnosis not present

## 2022-07-03 DIAGNOSIS — G8929 Other chronic pain: Secondary | ICD-10-CM

## 2022-07-03 DIAGNOSIS — M7061 Trochanteric bursitis, right hip: Secondary | ICD-10-CM

## 2022-07-03 DIAGNOSIS — Z87891 Personal history of nicotine dependence: Secondary | ICD-10-CM

## 2022-07-03 DIAGNOSIS — M19041 Primary osteoarthritis, right hand: Secondary | ICD-10-CM

## 2022-07-03 DIAGNOSIS — R768 Other specified abnormal immunological findings in serum: Secondary | ICD-10-CM

## 2022-07-03 DIAGNOSIS — M19072 Primary osteoarthritis, left ankle and foot: Secondary | ICD-10-CM

## 2022-07-03 MED ORDER — PREDNISONE 5 MG PO TABS: ORAL_TABLET | ORAL | 2 refills | Status: DC

## 2022-07-03 NOTE — Patient Instructions (Addendum)
Standing Labs We placed an order today for your standing lab work.   Please have your standing labs drawn in December and every 3 months  Labs due in December include CBC with differential, CMP with GFR, ESR, hepatitis C quant  Please have your labs drawn 2 weeks prior to your appointment so that the provider can discuss your lab results at your appointment.  Please note that you may see your imaging and lab results in [facility] before we have reviewed them. We will contact you once all results are reviewed. Please allow our office up to 72 hours to thoroughly review all of the results before contacting the office for clarification of your results.  Lab hours are:   Monday through Thursday from 8:00 am -12:30 pm and 1:00 pm-5:00 pm and Friday from 8:00 am-12:00 pm.  Please be advised, all patients with office appointments requiring lab work will take precedent over walk-in lab work.   Labs are drawn by [facility]. Please bring your co-pay at the time of your lab draw.  You may receive a bill from [facility] for your lab work.  Please note if you are on Hydroxychloroquine and and an order has been placed for a Hydroxychloroquine level, you will need to have it drawn 4 hours or more after your last dose.  If you wish to have your labs drawn at another location, please call the office 24 hours in advance so we can fax the orders.  The office is located at [address] [place], [place], [place] [address] No appointment is necessary.    If you have any questions regarding directions or hours of operation,  please call [phone].   As a reminder, please drink plenty of water prior to coming for your lab work. Thanks!   Vaccines You are taking a medication(s) that can suppress your immune system.  The following immunizations are recommended: Flu annually Covid-19  Td/Tdap (tetanus, diphtheria, pertussis) every 10 years Pneumonia (Prevnar 15 then Pneumovax 23 at least 1 year apart.   Alternatively, can take Prevnar 20 without needing additional dose) Shingrix: 2 doses from 4 weeks to 6 months apart  Please check with your PCP to make sure you are up to date.   If you have signs or symptoms of an infection or start antibiotics: First, call your PCP for workup of your infection. Hold your medication through the infection, until you complete your antibiotics, and until symptoms resolve if you take the following: Injectable medication (Actemra, Benlysta, Cimzia, Cosentyx, Enbrel, Humira, Kevzara, Orencia, Remicade, Simponi, Stelara, Taltz, Tremfya) Methotrexate Leflunomide (Arava) Mycophenolate (Cellcept) Morrie Sheldon, Olumiant, or Rinvoq  Please get an annual skin examination to screen for skin cancer while you are on Humira.

## 2022-07-04 DIAGNOSIS — J449 Chronic obstructive pulmonary disease, unspecified: Secondary | ICD-10-CM | POA: Diagnosis not present

## 2022-07-04 DIAGNOSIS — I700 Atherosclerosis of aorta: Secondary | ICD-10-CM | POA: Diagnosis not present

## 2022-07-04 DIAGNOSIS — E119 Type 2 diabetes mellitus without complications: Secondary | ICD-10-CM | POA: Diagnosis not present

## 2022-07-04 DIAGNOSIS — R5383 Other fatigue: Secondary | ICD-10-CM | POA: Diagnosis not present

## 2022-07-04 DIAGNOSIS — I872 Venous insufficiency (chronic) (peripheral): Secondary | ICD-10-CM | POA: Diagnosis not present

## 2022-07-04 DIAGNOSIS — I8310 Varicose veins of unspecified lower extremity with inflammation: Secondary | ICD-10-CM | POA: Diagnosis not present

## 2022-07-04 DIAGNOSIS — E876 Hypokalemia: Secondary | ICD-10-CM | POA: Diagnosis not present

## 2022-07-04 DIAGNOSIS — J439 Emphysema, unspecified: Secondary | ICD-10-CM | POA: Diagnosis not present

## 2022-07-04 DIAGNOSIS — F17210 Nicotine dependence, cigarettes, uncomplicated: Secondary | ICD-10-CM | POA: Diagnosis not present

## 2022-07-04 DIAGNOSIS — M069 Rheumatoid arthritis, unspecified: Secondary | ICD-10-CM | POA: Diagnosis not present

## 2022-07-04 DIAGNOSIS — E7849 Other hyperlipidemia: Secondary | ICD-10-CM | POA: Diagnosis not present

## 2022-07-23 DIAGNOSIS — J209 Acute bronchitis, unspecified: Secondary | ICD-10-CM | POA: Diagnosis not present

## 2022-08-10 DIAGNOSIS — M25572 Pain in left ankle and joints of left foot: Secondary | ICD-10-CM | POA: Diagnosis not present

## 2022-08-10 DIAGNOSIS — M069 Rheumatoid arthritis, unspecified: Secondary | ICD-10-CM | POA: Diagnosis not present

## 2022-08-22 DIAGNOSIS — Z79899 Other long term (current) drug therapy: Secondary | ICD-10-CM | POA: Diagnosis not present

## 2022-08-22 DIAGNOSIS — M130 Polyarthritis, unspecified: Secondary | ICD-10-CM | POA: Diagnosis not present

## 2022-08-22 DIAGNOSIS — M059 Rheumatoid arthritis with rheumatoid factor, unspecified: Secondary | ICD-10-CM | POA: Diagnosis not present

## 2022-08-29 ENCOUNTER — Telehealth: Payer: Self-pay

## 2022-08-29 NOTE — Telephone Encounter (Signed)
Patient called reporting flare up with mouth sores. She requested prednisone refill. Advised she has more prednisone as RX in October was written with 2 refills should last until 10/03/2022. She will refill RX at pharmacy and call if any other issues.

## 2022-09-04 DIAGNOSIS — J069 Acute upper respiratory infection, unspecified: Secondary | ICD-10-CM | POA: Diagnosis not present

## 2022-09-04 DIAGNOSIS — R030 Elevated blood-pressure reading, without diagnosis of hypertension: Secondary | ICD-10-CM | POA: Diagnosis not present

## 2022-09-04 DIAGNOSIS — K130 Diseases of lips: Secondary | ICD-10-CM | POA: Diagnosis not present

## 2022-09-04 DIAGNOSIS — F17210 Nicotine dependence, cigarettes, uncomplicated: Secondary | ICD-10-CM | POA: Diagnosis not present

## 2022-09-06 ENCOUNTER — Other Ambulatory Visit: Payer: Self-pay | Admitting: *Deleted

## 2022-09-06 DIAGNOSIS — Z8619 Personal history of other infectious and parasitic diseases: Secondary | ICD-10-CM

## 2022-09-06 DIAGNOSIS — Z79899 Other long term (current) drug therapy: Secondary | ICD-10-CM

## 2022-09-06 DIAGNOSIS — M0579 Rheumatoid arthritis with rheumatoid factor of multiple sites without organ or systems involvement: Secondary | ICD-10-CM

## 2022-09-06 MED ORDER — PREDNISONE 5 MG PO TABS: ORAL_TABLET | ORAL | 2 refills | Status: DC

## 2022-09-06 MED ORDER — LEFLUNOMIDE 20 MG PO TABS: ORAL_TABLET | ORAL | 0 refills | Status: DC

## 2022-09-06 NOTE — Telephone Encounter (Signed)
Next Visit: 10/08/2022  Last Visit: 07/03/2022  Last Fill: 06/19/2022 Jolee Ewing), 07/03/2022 (Prednisone)   DX: Rheumatoid arthritis with rheumatoid factor of multiple sites without organ or systems involvement   Current Dose per office note 07/03/2022: Arava 20 mg 1 tablet by mouth daily. Prednisone 5 mg daily.   Labs: 05/25/22 CBC and CMP were normal.   Okay to refill Arava and Prednisone?

## 2022-09-24 NOTE — Progress Notes (Unsigned)
Office Visit Note  Patient: Christina Jenkins             Date of Birth: Mar 27, 1961           MRN: [id number]             PCP: Encarnacion Slates, PA-C Referring: Encarnacion Slates, PA-C Visit Date: 10/08/2022 Occupation: @GUAROCC @  Subjective:  Medication monitoring   History of Present Illness: Christina Jenkins is a 62 y.o. female with history of seropositive rheumatoid arthritis.  Patient remains on Humira 40 mg subcutaneous injections every 14 days and Arava 20 mg 1 tablet by mouth daily. Humira was started on 09/27/2021.  She remains on prednisone 5 mg daily.  She denies any signs or symptoms of a rheumatoid arthritis flare.  She reports that 3 weeks ago she was diagnosed with COVID-19.  She states she continues to have some residual congestion.  She states that she did not hold Humira or Arava during that time.  She is not currently experiencing any joint swelling.  She has not had any nocturnal pain.  Her morning stiffness continues to last about 60 minutes daily. She denies any new medical conditions.   Activities of Daily Living:  Patient reports morning stiffness for 60 minutes.   Patient Denies nocturnal pain.  Difficulty dressing/grooming: Denies Difficulty climbing stairs: Denies Difficulty getting out of chair: Denies Difficulty using hands for taps, buttons, cutlery, and/or writing: Reports  Review of Systems  Constitutional: Negative.  Negative for fatigue.  HENT:  Positive for mouth sores. Negative for mouth dryness.   Eyes: Negative.  Negative for dryness.  Respiratory: Negative.  Negative for shortness of breath.   Cardiovascular: Negative.  Negative for chest pain and palpitations.  Gastrointestinal: Negative.  Negative for blood in stool, constipation and diarrhea.  Endocrine: Negative.  Negative for increased urination.  Genitourinary: Negative.  Negative for involuntary urination.  Musculoskeletal:  Positive for joint pain, joint pain, joint swelling and morning stiffness.  Negative for gait problem, myalgias, muscle weakness, muscle tenderness and myalgias.  Skin: Negative.  Negative for color change, rash, hair loss and sensitivity to sunlight.  Allergic/Immunologic: Negative.  Negative for susceptible to infections.  Neurological: Negative.  Negative for dizziness and headaches.  Hematological: Negative.  Negative for swollen glands.  Psychiatric/Behavioral: Negative.  Negative for depressed mood and sleep disturbance. The patient is not nervous/anxious.     PMFS History:  Patient Active Problem List   Diagnosis Date Noted   Chest pain of uncertain etiology 04/30/2019   Abnormal nuclear stress test    History of COPD 05/31/2017   Fatigue 12/04/2016   History of seizures 10/23/2016   History of hepatitis C 10/23/2016   ANA positive 10/23/2016   Sicca syndrome, unspecified 10/23/2016   Seropositive rheumatoid arthritis (HCC) 08/01/2016   High risk medication use 08/01/2016   Hepatitis C 08/01/2016   Tobacco abuse 08/01/2016   Depression 08/01/2016   Complex partial seizure (HCC) 02/02/2016   MCI (mild cognitive impairment) 11/02/2015   Arthralgia 11/02/2015   Carpal tunnel syndrome 10/20/2015   Syncope 05/17/2015   Convulsions/seizures (HCC) 05/17/2015    Past Medical History:  Diagnosis Date   Carpal tunnel syndrome    COPD (chronic obstructive pulmonary disease) (HCC)    Coronary artery calcification seen on CT scan    Normal coronaries at cardiac catheterization 2020   Depression    Essential hypertension    Hepatitis C    Hypokalemia    Mixed hyperlipidemia  Rheumatoid arthritis (HCC)    Rheumatoid arthritis (HCC)    Seasonal allergies    Type 2 diabetes mellitus (HCC)    Varicose veins of legs     Family History  Problem Relation Age of Onset   Diabetes Mother    Pancreatic cancer Mother    Diabetes Father    COPD Father    Dementia Father    Diabetes Brother    Past Surgical History:  Procedure Laterality Date   BACK  SURGERY     LEFT HEART CATH AND CORONARY ANGIOGRAPHY N/A 04/30/2019   Procedure: LEFT HEART CATH AND CORONARY ANGIOGRAPHY;  Surgeon: Lorretta Harp, MD;  Location: [facility];  Service: Cardiovascular;  Laterality: N/A;   TONSILLECTOMY     TUBAL LIGATION     Social History   Social History Narrative   Not on file   Immunization History  Administered Date(s) Administered   Influenza,inj,Quad PF,6+ Mos 05/16/2017, 05/15/2018   Moderna Sars-Covid-2 Vaccination 11/16/2019, 12/14/2019, 08/11/2020   Tdap 06/26/2018   Zoster Recombinat (Shingrix) 09/04/2017     Objective: Vital Signs: BP 122/76 (BP Location: Left Arm, Patient Position: Sitting, Cuff Size: Normal)   Pulse 99   Ht 5\' 6"  (1.676 m)   Wt 191 lb 6.4 oz (86.8 kg)   BMI 30.89 kg/m    Physical Exam Vitals and nursing note reviewed.  Constitutional:      Appearance: She is well-developed.  HENT:     Head: Normocephalic and atraumatic.  Eyes:     Conjunctiva/sclera: Conjunctivae normal.  Cardiovascular:     Rate and Rhythm: Normal rate and regular rhythm.     Heart sounds: Normal heart sounds.  Pulmonary:     Effort: Pulmonary effort is normal.     Breath sounds: Normal breath sounds.  Abdominal:     General: Bowel sounds are normal.     Palpations: Abdomen is soft.  Musculoskeletal:     Cervical back: Normal range of motion.  Skin:    General: Skin is warm and dry.     Capillary Refill: Capillary refill takes less than 2 seconds.  Neurological:     Mental Status: She is alert and oriented to person, place, and time.  Psychiatric:        Behavior: Behavior normal.      Musculoskeletal Exam: C-spine, thoracic spine, lumbar spine have good range of motion.  Shoulder joints, elbow joints, wrist joints, MCPs, PIPs, DIPs have good range of motion with no synovitis.  Complete fist formation bilaterally.  Hip joints have good range of motion with no groin pain.  Knee joints have good range of motion with no  warmth or effusion.  Ankle joints have good range of motion with no tenderness or joint swelling.  CDAI Exam: CDAI Score: -- Patient Global: 4 mm; Provider Global: 4 mm Swollen: --; Tender: -- Joint Exam 10/08/2022   No joint exam has been documented for this visit   There is currently no information documented on the homunculus. Go to the Rheumatology activity and complete the homunculus joint exam.  Investigation: No additional findings.  Imaging: No results found.  Recent Labs: Lab Results  Component Value Date   WBC 8.5 12/09/2019   HGB 15.3 12/09/2019   PLT 201 12/09/2019   NA 143 12/09/2019   K 3.7 12/09/2019   CL 104 12/09/2019   CO2 30 12/09/2019   GLUCOSE 93 12/09/2019   BUN 11 12/09/2019   CREATININE 0.82 12/09/2019  BILITOT 0.4 12/09/2019   ALKPHOS 69 04/29/2017   AST 22 12/09/2019   ALT 29 12/09/2019   PROT 6.2 12/09/2019   ALBUMIN 4.2 04/29/2017   CALCIUM 9.4 12/09/2019   GFRAA 91 12/09/2019   QFTBGOLDPLUS NEGATIVE 12/09/2019    Speciality Comments: Need Hep C quant every 6 months Prior therapy: methotrexate (oral ulcers) and Cimzia (inadequate response) , Enbrel stopped Jan 2023 Humira started 09/27/21  Procedures:  No procedures performed Allergies: Patient has no known allergies.  .   Assessment / Plan:     Visit Diagnoses: Rheumatoid arthritis with rheumatoid factor of multiple sites without organ or systems involvement [facility]): She has no synovitis on examination today.  She has not had any recent rheumatoid arthritis flares.  She is clinically doing well on Humira 40 mg sq injections every 14 days and arava 20 mg daily.  She is tolerating combination therapy without any side effects and has not missed any doses recently.  She was diagnosed with COVID-19 infection 3 weeks ago at which time she did not hold Humira or Arava during that time.  Discussed the importance of holding Humira and arava she develops signs or symptoms of an infection and to  resume once the infection has completely cleared. She remains on prednisone 5 mg 1 tablet daily as a maintenance dose.  She is aware of the risks of long-term prednisone use.  She does not need any refills at this time.  She was advised to notify us if she develops signs or symptoms of a flare.  She will follow-up in the office in 3 months or sooner if needed.  High risk medication use - Humira 40 mg subcutaneous injections every 14 days and Arava 20 mg 1 tablet by mouth daily.  Humira was started on 09/27/2021.   She is taking Prednisone 5 mg daily. CBC and CMP updated on 05/26/22. Order for CBC and CMP placed today.  Her next lab work will be due in May and every 3 months to monitor for drug toxicity. Discussed the importance of holding humira and Arava if she develops signs or symptoms of an infection and to resume once the infection has completely cleared. Discussed the risks of long term prednisone use.  Hepatitis C quant ordered today.  Chronic right shoulder pain: She has good ROM of the right shoulder with no discomfort or tenderness at this time.   Primary osteoarthritis of both hands: No tenderness or synovitis at this time. Complete fist formation bilaterally.    Primary osteoarthritis of both feet: Ankle joints have good ROM with no tenderness or swelling.   Trochanteric bursitis of both hips: Discussed the importance of performing stretching exercises daily.    ANA positive - No clinical features of lupus.    History of hepatitis C - HCV quant nonreactive on 11/03/21.  Future order placed today.   Other medical conditions are listed as follows:   Smoker  History of COPD  History of seizures    Orders: No orders of the defined types were placed in this encounter.  No orders of the defined types were placed in this encounter.    Follow-Up Instructions: Return in about 3 months (around 01/06/2023).   Ofilia Neas, PA-C  Note - This record has been created using Dragon  software.  Chart creation errors have been sought, but may not always  have been located. Such creation errors do not reflect on  the standard of medical care.

## 2022-10-08 ENCOUNTER — Ambulatory Visit: Payer: Medicare Other | Attending: Physician Assistant | Admitting: Physician Assistant

## 2022-10-08 ENCOUNTER — Encounter: Payer: Self-pay | Admitting: Physician Assistant

## 2022-10-08 VITALS — BP 122/76 | HR 99 | Ht 66.0 in | Wt 191.4 lb

## 2022-10-08 DIAGNOSIS — R7689 Other specified abnormal immunological findings in serum: Secondary | ICD-10-CM

## 2022-10-08 DIAGNOSIS — Z8619 Personal history of other infectious and parasitic diseases: Secondary | ICD-10-CM

## 2022-10-08 DIAGNOSIS — Z79899 Other long term (current) drug therapy: Secondary | ICD-10-CM | POA: Diagnosis not present

## 2022-10-08 DIAGNOSIS — M7062 Trochanteric bursitis, left hip: Secondary | ICD-10-CM

## 2022-10-08 DIAGNOSIS — F17200 Nicotine dependence, unspecified, uncomplicated: Secondary | ICD-10-CM

## 2022-10-08 DIAGNOSIS — M19072 Primary osteoarthritis, left ankle and foot: Secondary | ICD-10-CM

## 2022-10-08 DIAGNOSIS — Z87898 Personal history of other specified conditions: Secondary | ICD-10-CM | POA: Diagnosis not present

## 2022-10-08 DIAGNOSIS — M19041 Primary osteoarthritis, right hand: Secondary | ICD-10-CM

## 2022-10-08 DIAGNOSIS — M0579 Rheumatoid arthritis with rheumatoid factor of multiple sites without organ or systems involvement: Secondary | ICD-10-CM

## 2022-10-08 DIAGNOSIS — M19042 Primary osteoarthritis, left hand: Secondary | ICD-10-CM

## 2022-10-08 DIAGNOSIS — Z8709 Personal history of other diseases of the respiratory system: Secondary | ICD-10-CM

## 2022-10-08 DIAGNOSIS — G8929 Other chronic pain: Secondary | ICD-10-CM

## 2022-10-08 DIAGNOSIS — M25511 Pain in right shoulder: Secondary | ICD-10-CM

## 2022-10-08 DIAGNOSIS — R768 Other specified abnormal immunological findings in serum: Secondary | ICD-10-CM | POA: Diagnosis not present

## 2022-10-08 DIAGNOSIS — M19071 Primary osteoarthritis, right ankle and foot: Secondary | ICD-10-CM

## 2022-10-08 DIAGNOSIS — M7061 Trochanteric bursitis, right hip: Secondary | ICD-10-CM | POA: Diagnosis not present

## 2022-10-09 DIAGNOSIS — R059 Cough, unspecified: Secondary | ICD-10-CM | POA: Diagnosis not present

## 2022-10-09 DIAGNOSIS — J0100 Acute maxillary sinusitis, unspecified: Secondary | ICD-10-CM | POA: Diagnosis not present

## 2022-10-09 DIAGNOSIS — Z79899 Other long term (current) drug therapy: Secondary | ICD-10-CM | POA: Diagnosis not present

## 2022-10-09 DIAGNOSIS — F17210 Nicotine dependence, cigarettes, uncomplicated: Secondary | ICD-10-CM | POA: Diagnosis not present

## 2022-10-09 DIAGNOSIS — M0600 Rheumatoid arthritis without rheumatoid factor, unspecified site: Secondary | ICD-10-CM | POA: Diagnosis not present

## 2022-10-09 DIAGNOSIS — Z79620 Long term (current) use of immunosuppressive biologic: Secondary | ICD-10-CM | POA: Diagnosis not present

## 2022-10-09 DIAGNOSIS — Z8619 Personal history of other infectious and parasitic diseases: Secondary | ICD-10-CM | POA: Diagnosis not present

## 2022-10-09 DIAGNOSIS — R030 Elevated blood-pressure reading, without diagnosis of hypertension: Secondary | ICD-10-CM | POA: Diagnosis not present

## 2022-10-16 ENCOUNTER — Telehealth: Payer: Self-pay | Admitting: *Deleted

## 2022-10-16 NOTE — Telephone Encounter (Signed)
Labs received from:Amy Luciana Axe PA-C  Drawn on:10/09/2022  Reviewed by:Hazel Sams, PA-C  Labs drawn:CMP, CBC, ESR, HCV  Results:ESR WNL  HCV Neg  CBC/CMP WNL

## 2022-11-01 DIAGNOSIS — M069 Rheumatoid arthritis, unspecified: Secondary | ICD-10-CM | POA: Diagnosis not present

## 2022-11-02 DIAGNOSIS — E7849 Other hyperlipidemia: Secondary | ICD-10-CM | POA: Diagnosis not present

## 2022-11-02 DIAGNOSIS — I10 Essential (primary) hypertension: Secondary | ICD-10-CM | POA: Diagnosis not present

## 2022-11-02 DIAGNOSIS — E119 Type 2 diabetes mellitus without complications: Secondary | ICD-10-CM | POA: Diagnosis not present

## 2022-11-06 DIAGNOSIS — R5383 Other fatigue: Secondary | ICD-10-CM | POA: Diagnosis not present

## 2022-11-06 DIAGNOSIS — E7849 Other hyperlipidemia: Secondary | ICD-10-CM | POA: Diagnosis not present

## 2022-11-06 DIAGNOSIS — J439 Emphysema, unspecified: Secondary | ICD-10-CM | POA: Diagnosis not present

## 2022-11-06 DIAGNOSIS — E119 Type 2 diabetes mellitus without complications: Secondary | ICD-10-CM | POA: Diagnosis not present

## 2022-11-06 DIAGNOSIS — R030 Elevated blood-pressure reading, without diagnosis of hypertension: Secondary | ICD-10-CM | POA: Diagnosis not present

## 2022-11-06 DIAGNOSIS — M25572 Pain in left ankle and joints of left foot: Secondary | ICD-10-CM | POA: Diagnosis not present

## 2022-11-06 DIAGNOSIS — I700 Atherosclerosis of aorta: Secondary | ICD-10-CM | POA: Diagnosis not present

## 2022-11-06 DIAGNOSIS — I8310 Varicose veins of unspecified lower extremity with inflammation: Secondary | ICD-10-CM | POA: Diagnosis not present

## 2022-11-06 DIAGNOSIS — M069 Rheumatoid arthritis, unspecified: Secondary | ICD-10-CM | POA: Diagnosis not present

## 2022-11-06 DIAGNOSIS — I872 Venous insufficiency (chronic) (peripheral): Secondary | ICD-10-CM | POA: Diagnosis not present

## 2022-11-06 DIAGNOSIS — H6121 Impacted cerumen, right ear: Secondary | ICD-10-CM | POA: Diagnosis not present

## 2022-11-06 DIAGNOSIS — E876 Hypokalemia: Secondary | ICD-10-CM | POA: Diagnosis not present

## 2022-11-16 ENCOUNTER — Other Ambulatory Visit: Payer: Self-pay | Admitting: *Deleted

## 2022-11-16 MED ORDER — PREDNISONE 5 MG PO TABS: ORAL_TABLET | ORAL | 2 refills | Status: DC

## 2022-11-16 NOTE — Telephone Encounter (Signed)
Next Visit: 01/07/2023  Last Visit: 10/08/2022  Last Fill:09/06/2022  Dx: Rheumatoid arthritis with rheumatoid factor of multiple sites without organ or systems involvement   Current Dose per office note on 10/08/2022: prednisone 5 mg 1 tablet daily as a maintenance dose.   Okay to refill prednisone?

## 2022-11-16 NOTE — Telephone Encounter (Signed)
Ok to refill maintenance dose of prednisone 5 mg 1 tablet by mouth daily.

## 2022-12-18 ENCOUNTER — Other Ambulatory Visit: Payer: Self-pay | Admitting: *Deleted

## 2022-12-18 MED ORDER — LEFLUNOMIDE 20 MG PO TABS: ORAL_TABLET | ORAL | 0 refills | Status: DC

## 2022-12-18 NOTE — Telephone Encounter (Signed)
Refill request received via fax from [facility] for Arava   Last Fill: 09/06/2022  Labs: 10/09/2022 CBC/CMP WNL   Next Visit: 01/07/2023  Last Visit: 10/08/2022  DX:  Rheumatoid arthritis with rheumatoid factor of multiple sites without organ or systems involvement   Current Dose per office note 10/08/2022: Arava 20 mg 1 tablet by mouth daily   Okay to refill Arava ?

## 2022-12-24 NOTE — Progress Notes (Signed)
Office Visit Note  Patient: Christina Jenkins             Date of Birth: May 30, 1961           MRN: [id number]             PCP: Encarnacion Slates, PA-C Referring: Encarnacion Slates, PA-C Visit Date: 01/07/2023 Occupation: @GUAROCC @  Subjective:  Left shoulder joint pain   History of Present Illness: Christina Jenkins is a 62 y.o. female with history of seropositive rheumatoid arthritis and osteoarthritis.  Patient is taking Humira 40 mg subcutaneous injections every 14 days and Arava 20 mg 1 tablet by mouth daily. Humira was started on 09/27/2021.  She denies missing any doses of Humira or a Reyvow recently.  She has not had any recent or recurrent infections.  Patient reports that she continues to have about 2 flares per month.  She states she is currently having a flare in the left shoulder which started about 2 weeks ago.  She denies any injury or overuse activities prior to the onset of symptoms.  She states she is mainly been having daytime pain and discomfort with range of motion.  She states ibuprofen has not been helping.  She remains on prednisone 5 mg daily but states that when she has a flare she increases the dose of prednisone for several days which typically alleviates the symptoms.  She states that during the flare she does not notice any joint swelling.  Patient reports that overall she has not noticed any difference since adding on Arava previously.   Activities of Daily Living:  Patient reports morning stiffness for 30-60 minutes.   Patient Reports nocturnal pain.  Difficulty dressing/grooming: Denies Difficulty climbing stairs: Denies Difficulty getting out of chair: Denies Difficulty using hands for taps, buttons, cutlery, and/or writing: Denies  Review of Systems  Constitutional:  Negative for fatigue.  HENT:  Positive for mouth sores. Negative for mouth dryness.   Eyes:  Negative for dryness.  Respiratory:  Negative for shortness of breath.   Cardiovascular:  Negative for chest  pain and palpitations.  Gastrointestinal:  Negative for blood in stool, constipation and diarrhea.  Endocrine: Negative for increased urination.  Genitourinary:  Negative for involuntary urination.  Musculoskeletal:  Positive for joint pain, joint pain and morning stiffness. Negative for gait problem, joint swelling, myalgias, muscle weakness, muscle tenderness and myalgias.  Skin:  Negative for color change, rash, hair loss and sensitivity to sunlight.  Allergic/Immunologic: Negative for susceptible to infections.  Neurological:  Negative for dizziness and headaches.  Hematological:  Negative for swollen glands.  Psychiatric/Behavioral:  Positive for depressed mood. Negative for sleep disturbance. The patient is nervous/anxious.     PMFS History:  Patient Active Problem List   Diagnosis Date Noted   Chest pain of uncertain etiology 04/30/2019   Abnormal nuclear stress test    History of COPD 05/31/2017   Fatigue 12/04/2016   History of seizures 10/23/2016   History of hepatitis C 10/23/2016   ANA positive 10/23/2016   Sicca syndrome, unspecified 10/23/2016   Seropositive rheumatoid arthritis (HCC) 08/01/2016   High risk medication use 08/01/2016   Hepatitis C 08/01/2016   Tobacco abuse 08/01/2016   Depression 08/01/2016   Complex partial seizure (HCC) 02/02/2016   MCI (mild cognitive impairment) 11/02/2015   Arthralgia 11/02/2015   Carpal tunnel syndrome 10/20/2015   Syncope 05/17/2015   Convulsions/seizures (HCC) 05/17/2015    Past Medical History:  Diagnosis Date   Carpal  tunnel syndrome    COPD (chronic obstructive pulmonary disease) (HCC)    Coronary artery calcification seen on CT scan    Normal coronaries at cardiac catheterization 2020   Depression    Essential hypertension    Hepatitis C    Hypokalemia    Mixed hyperlipidemia    Rheumatoid arthritis (HCC)    Rheumatoid arthritis (HCC)    Seasonal allergies    Type 2 diabetes mellitus (HCC)    Varicose veins  of legs     Family History  Problem Relation Age of Onset   Diabetes Mother    Pancreatic cancer Mother    Diabetes Father    COPD Father    Dementia Father    Diabetes Brother    Past Surgical History:  Procedure Laterality Date   BACK SURGERY     LEFT HEART CATH AND CORONARY ANGIOGRAPHY N/A 04/30/2019   Procedure: LEFT HEART CATH AND CORONARY ANGIOGRAPHY;  Surgeon: Runell Gess, MD;  Location: [facility];  Service: Cardiovascular;  Laterality: N/A;   TONSILLECTOMY     TUBAL LIGATION     Social History   Social History Narrative   Not on file   Immunization History  Administered Date(s) Administered   Influenza,inj,Quad PF,6+ Mos 05/16/2017, 05/15/2018   Moderna Sars-Covid-2 Vaccination 11/16/2019, 12/14/2019, 08/11/2020   Tdap 06/26/2018   Zoster Recombinat (Shingrix) 09/04/2017     Objective: Vital Signs: BP 111/73 (BP Location: Left Arm, Patient Position: Sitting, Cuff Size: Normal)   Pulse 91   Resp 16   Ht 5\' 6"  (1.676 m)   Wt 186 lb (84.4 kg)   BMI 30.02 kg/m    Physical Exam Vitals and nursing note reviewed.  Constitutional:      Appearance: She is well-developed.  HENT:     Head: Normocephalic and atraumatic.  Eyes:     Conjunctiva/sclera: Conjunctivae normal.  Cardiovascular:     Rate and Rhythm: Normal rate and regular rhythm.     Heart sounds: Normal heart sounds.  Pulmonary:     Effort: Pulmonary effort is normal.     Breath sounds: Normal breath sounds.  Abdominal:     General: Bowel sounds are normal.     Palpations: Abdomen is soft.  Musculoskeletal:     Cervical back: Normal range of motion.  Lymphadenopathy:     Cervical: No cervical adenopathy.  Skin:    General: Skin is warm and dry.     Capillary Refill: Capillary refill takes less than 2 seconds.  Neurological:     Mental Status: She is alert and oriented to person, place, and time.  Psychiatric:        Behavior: Behavior normal.      Musculoskeletal Exam:  C-spine, thoracic spine, and lumbar spine have good ROM.  Shoulder joints have good ROM with discomfort in the left shoulder.  Elbow joints, wrist joints, MCPs, PIPs, and DIPs good ROM with no synovitis.  Complete fist formation bilaterally.  Hip joints have good ROM with no groin pain.  Knee joints have good ROM with no warmth or effusion.  Ankle joints have good ROM with no tenderness or joint swelling.    CDAI Exam: CDAI Score: 2.2  Patient Global: 6 mm; Provider Global: 6 mm Swollen: 0 ; Tender: 1  Joint Exam 01/07/2023      Right  Left  Glenohumeral      Tender     Investigation: No additional findings.  Imaging: No results found.  Recent  Labs: Lab Results  Component Value Date   WBC 8.5 12/09/2019   HGB 15.3 12/09/2019   PLT 201 12/09/2019   NA 143 12/09/2019   K 3.7 12/09/2019   CL 104 12/09/2019   CO2 30 12/09/2019   GLUCOSE 93 12/09/2019   BUN 11 12/09/2019   CREATININE 0.82 12/09/2019   BILITOT 0.4 12/09/2019   ALKPHOS 69 04/29/2017   AST 22 12/09/2019   ALT 29 12/09/2019   PROT 6.2 12/09/2019   ALBUMIN 4.2 04/29/2017   CALCIUM 9.4 12/09/2019   GFRAA 91 12/09/2019   QFTBGOLDPLUS NEGATIVE 12/09/2019    Speciality Comments: Need Hep C quant every 6 months Prior therapy: methotrexate (oral ulcers) and Cimzia (inadequate response) , Enbrel stopped Jan 2023 Humira started 09/27/21  Procedures:  Large Joint Inj: L glenohumeral on 01/07/2023 1:28 PM Indications: pain Details: 27 G 1.5 in needle, posterior approach  Arthrogram: No  Medications: 1 mL lidocaine 1 %; 40 mg triamcinolone acetonide 40 MG/ML Aspirate: 0 mL Outcome: tolerated well, no immediate complications Procedure, treatment alternatives, risks and benefits explained, specific risks discussed. Consent was given by the patient. Immediately prior to procedure a time out was called to verify the correct patient, procedure, equipment, support staff and site/side marked as required. Patient was prepped  and draped in the usual sterile fashion.     Allergies: Patient has no known allergies.    Assessment / Plan:     Visit Diagnoses: Rheumatoid arthritis with rheumatoid factor of multiple sites without organ or systems involvement [facility]): Patient presents today experiencing a flare in the left shoulder.  Her symptoms started about 2 weeks ago with no identifiable trigger or injury.  On examination she has painful range of motion of the left shoulder with some tenderness upon palpation.  The left shoulder was x-rayed and after informed consent the left glenohumeral joint was injected with cortisone.  She tolerated the procedure well and the procedure note was completed above.  According to the patient she continues to have flares twice a month but does not experience any swelling during this time.  She remains on Humira 40 mg subcutaneous injections every 14 days and Arava 20 mg 1 tablet daily.  She is tolerating combination therapy without any side effects or recurrent infections.  She is apprehensive to make any medication changes despite the frequency of flares.  She is also declined a referral to pain management.  She remains on prednisone 5 mg 1 tablet daily.  She is aware of the risks of long-term prednisone use.  She was advised to notify us if she continues to have recurrent flares.  She will remain on Humira and Arava as combination therapy.  A refill of both prednisone and Humira were sent to the pharmacy today.  She will follow-up in the office in 5 months or sooner if needed.  High risk medication use - Humira 40 mg subcutaneous injections every 14 days and Arava 20 mg 1 tablet by mouth daily.  Humira was started on 09/27/2021. CBC and CMP updated on 10/10/22.  Due to update CBC and CMP.  Future orders provided to the patient today to be [facility]. Hepatitis C antibody negative on 10/10/22  TB gold negative on 02/12/22.  Future order for TB gold provided to the patient today.  No  recent or recurrent infections. Discussed the importance of holding humira and arava if she develops signs or symptoms of an infection and to resume once the infection has completely  cleared.   Chronic right shoulder pain:She has good ROM of the right shoulder with no discomfort.   Chronic left shoulder pain -She presents today with increased pain in the left shoulder.  She has not had any injury or overuse activities prior to the onset of symptoms about 2 weeks ago.  She has tried taking ibuprofen with no relief.  She remains on Humira, Reyvow, and prednisone as prescribed.  This morning she tried taking prednisone 10 mg but has not had any relief.  X-rays of the left shoulder updated today.  After informed consent the left glenohumeral joint was injected with cortisone.  She tolerated procedure well.  Procedure note was completed above.  Aftercare was discussed.  Plan: XR Shoulder Left  Primary osteoarthritis of both hands: She has PIP and DIP thickening consistent with osteoarthritis of both hands.  No tenderness or synovitis noted.  Complete fist formation noted bilaterally.  Discussed the importance of joint protection and muscle strengthening.  Primary osteoarthritis of both feet: She is not experiencing any increased discomfort in her feet at this time.  She has good range of motion of both ankle joints with no tenderness or synovitis.  She is wearing proper fitting shoes.  Trochanteric bursitis of both hips: Good ROM of both hips with no groin pain.  No tenderness over trochanteric bursa.    ANA positive - No clinical features of systemic lupus.  Other medical conditions are listed as follows:   History of hepatitis C - HCV quant nonreactive on 11/03/21. Hepatitis C antibody negative on 02/12/22.   History of COPD  History of seizures  Smoker    Orders: Orders Placed This Encounter  Procedures   Large Joint Inj: L glenohumeral   XR Shoulder Left   Meds ordered this encounter   Medications   predniSONE (DELTASONE) 5 MG tablet    Sig: TAKE 1 TABLET(5 MG) BY MOUTH DAILY WITH BREAKFAST    Dispense:  30 tablet    Refill:  2   Adalimumab (HUMIRA, 2 PEN,) 40 MG/0.4ML PNKT    Sig: Inject 40 mg into the skin every 14 (fourteen) days. 1 kit - 2 pens    Dispense:  6 each    Refill:  0   Follow-Up Instructions: Return in 5 months (on 06/09/2023) for Rheumatoid arthritis.   Gearldine Bienenstock, PA-C  Note - This record has been created using Dragon software.  Chart creation errors have been sought, but may not always  have been located. Such creation errors do not reflect on  the standard of medical care.

## 2023-01-07 ENCOUNTER — Encounter: Payer: Self-pay | Admitting: Physician Assistant

## 2023-01-07 ENCOUNTER — Ambulatory Visit: Payer: Medicare Other | Attending: Physician Assistant | Admitting: Physician Assistant

## 2023-01-07 ENCOUNTER — Ambulatory Visit (INDEPENDENT_AMBULATORY_CARE_PROVIDER_SITE_OTHER): Payer: Medicare Other

## 2023-01-07 VITALS — BP 111/73 | HR 91 | Resp 16 | Ht 66.0 in | Wt 186.0 lb

## 2023-01-07 DIAGNOSIS — Z79899 Other long term (current) drug therapy: Secondary | ICD-10-CM | POA: Diagnosis not present

## 2023-01-07 DIAGNOSIS — Z8619 Personal history of other infectious and parasitic diseases: Secondary | ICD-10-CM | POA: Diagnosis not present

## 2023-01-07 DIAGNOSIS — M25511 Pain in right shoulder: Secondary | ICD-10-CM | POA: Diagnosis not present

## 2023-01-07 DIAGNOSIS — G8929 Other chronic pain: Secondary | ICD-10-CM

## 2023-01-07 DIAGNOSIS — M25512 Pain in left shoulder: Secondary | ICD-10-CM

## 2023-01-07 DIAGNOSIS — R768 Other specified abnormal immunological findings in serum: Secondary | ICD-10-CM | POA: Diagnosis not present

## 2023-01-07 DIAGNOSIS — M19041 Primary osteoarthritis, right hand: Secondary | ICD-10-CM

## 2023-01-07 DIAGNOSIS — M7061 Trochanteric bursitis, right hip: Secondary | ICD-10-CM | POA: Diagnosis not present

## 2023-01-07 DIAGNOSIS — Z8709 Personal history of other diseases of the respiratory system: Secondary | ICD-10-CM | POA: Diagnosis not present

## 2023-01-07 DIAGNOSIS — M0579 Rheumatoid arthritis with rheumatoid factor of multiple sites without organ or systems involvement: Secondary | ICD-10-CM

## 2023-01-07 DIAGNOSIS — M7062 Trochanteric bursitis, left hip: Secondary | ICD-10-CM

## 2023-01-07 DIAGNOSIS — M19072 Primary osteoarthritis, left ankle and foot: Secondary | ICD-10-CM

## 2023-01-07 DIAGNOSIS — M19071 Primary osteoarthritis, right ankle and foot: Secondary | ICD-10-CM | POA: Diagnosis not present

## 2023-01-07 DIAGNOSIS — F17200 Nicotine dependence, unspecified, uncomplicated: Secondary | ICD-10-CM | POA: Diagnosis not present

## 2023-01-07 DIAGNOSIS — M19042 Primary osteoarthritis, left hand: Secondary | ICD-10-CM

## 2023-01-07 DIAGNOSIS — Z87898 Personal history of other specified conditions: Secondary | ICD-10-CM | POA: Diagnosis not present

## 2023-01-07 MED ORDER — HUMIRA (2 PEN) 40 MG/0.4ML ~~LOC~~ AJKT: 40.0000 mg | AUTO-INJECTOR | SUBCUTANEOUS | 0 refills | Status: DC

## 2023-01-07 MED ORDER — LIDOCAINE HCL 1 % IJ SOLN
1.0000 mL | INTRAMUSCULAR | Status: AC | PRN
Start: 2023-01-07 — End: 2023-01-07
  Administered 2023-01-07: 1 mL

## 2023-01-07 MED ORDER — PREDNISONE 5 MG PO TABS: ORAL_TABLET | ORAL | 2 refills | Status: DC

## 2023-01-07 MED ORDER — TRIAMCINOLONE ACETONIDE 40 MG/ML IJ SUSP
40.0000 mg | INTRAMUSCULAR | Status: AC | PRN
Start: 2023-01-07 — End: 2023-01-07
  Administered 2023-01-07: 40 mg via INTRA_ARTICULAR

## 2023-01-07 NOTE — Patient Instructions (Signed)
Standing Labs We placed an order today for your standing lab work.   Please have your standing labs drawn in May and every 3 months   Please have your labs drawn 2 weeks prior to your appointment so that the provider can discuss your lab results at your appointment, if possible.  Please note that you may see your imaging and lab results in MyChart before we have reviewed them. We will contact you once all results are reviewed. Please allow our office up to 72 hours to thoroughly review all of the results before contacting the office for clarification of your results.  WALK-IN LAB HOURS  Monday through Thursday from 8:00 am -12:30 pm and 1:00 pm-5:00 pm and Friday from 8:00 am-12:00 pm.  Patients with office visits requiring labs will be seen before walk-in labs.  You may encounter longer than normal wait times. Please allow additional time. Wait times may be shorter on  Monday and Thursday afternoons.  We do not book appointments for walk-in labs. We appreciate your patience and understanding with our staff.   Labs are drawn by [facility]. Please bring your co-pay at the time of your lab draw.  You may receive a bill from [facility] for your lab work.  Please note if you are on Hydroxychloroquine and and an order has been placed for a Hydroxychloroquine level,  you will need to have it drawn 4 hours or more after your last dose.  If you wish to have your labs drawn at another location, please call the office 24 hours in advance so we can fax the orders.  The office is located at [address], [address], [place], [place] [address]   If you have any questions regarding directions or hours of operation,  please call [phone].   As a reminder, please drink plenty of water prior to coming for your lab work. Thanks!  

## 2023-01-08 NOTE — Progress Notes (Signed)
X-ray of left shoulder is unremarkable. Please notify the patient.

## 2023-01-09 DIAGNOSIS — E782 Mixed hyperlipidemia: Secondary | ICD-10-CM | POA: Diagnosis not present

## 2023-01-09 DIAGNOSIS — Z79899 Other long term (current) drug therapy: Secondary | ICD-10-CM | POA: Diagnosis not present

## 2023-01-09 DIAGNOSIS — I10 Essential (primary) hypertension: Secondary | ICD-10-CM | POA: Diagnosis not present

## 2023-01-09 DIAGNOSIS — R5383 Other fatigue: Secondary | ICD-10-CM | POA: Diagnosis not present

## 2023-01-15 LAB — LAB REPORT - SCANNED: EGFR: 85

## 2023-01-18 ENCOUNTER — Telehealth: Payer: Self-pay | Admitting: *Deleted

## 2023-01-18 NOTE — Telephone Encounter (Signed)
Labs received from:Amy Leavy Cella, PA-C  Drawn on:01/09/2023  Reviewed by:Dr. Pollyann Savoy   Labs drawn:CMP, CBC, TB Gold  Results: Glucose 135    WBC 14.0    Neutrophils (Absolute) 12.6    TB Gold Indeterminate   Per Dr. Corliss Skains consider PPD.  Called patient and advised of results and to have PPD. Patient was at [facility] office currently. Faxed order for PPD.

## 2023-01-21 DIAGNOSIS — Z1159 Encounter for screening for other viral diseases: Secondary | ICD-10-CM | POA: Diagnosis not present

## 2023-01-30 DIAGNOSIS — E7849 Other hyperlipidemia: Secondary | ICD-10-CM | POA: Diagnosis not present

## 2023-01-30 DIAGNOSIS — E119 Type 2 diabetes mellitus without complications: Secondary | ICD-10-CM | POA: Diagnosis not present

## 2023-01-30 DIAGNOSIS — I10 Essential (primary) hypertension: Secondary | ICD-10-CM | POA: Diagnosis not present

## 2023-02-06 DIAGNOSIS — J439 Emphysema, unspecified: Secondary | ICD-10-CM | POA: Diagnosis not present

## 2023-02-06 DIAGNOSIS — M25572 Pain in left ankle and joints of left foot: Secondary | ICD-10-CM | POA: Diagnosis not present

## 2023-02-06 DIAGNOSIS — I8310 Varicose veins of unspecified lower extremity with inflammation: Secondary | ICD-10-CM | POA: Diagnosis not present

## 2023-02-06 DIAGNOSIS — I700 Atherosclerosis of aorta: Secondary | ICD-10-CM | POA: Diagnosis not present

## 2023-02-06 DIAGNOSIS — M069 Rheumatoid arthritis, unspecified: Secondary | ICD-10-CM | POA: Diagnosis not present

## 2023-02-06 DIAGNOSIS — E7849 Other hyperlipidemia: Secondary | ICD-10-CM | POA: Diagnosis not present

## 2023-02-06 DIAGNOSIS — I872 Venous insufficiency (chronic) (peripheral): Secondary | ICD-10-CM | POA: Diagnosis not present

## 2023-02-06 DIAGNOSIS — R5383 Other fatigue: Secondary | ICD-10-CM | POA: Diagnosis not present

## 2023-02-06 DIAGNOSIS — Z1389 Encounter for screening for other disorder: Secondary | ICD-10-CM | POA: Diagnosis not present

## 2023-02-06 DIAGNOSIS — R030 Elevated blood-pressure reading, without diagnosis of hypertension: Secondary | ICD-10-CM | POA: Diagnosis not present

## 2023-02-06 DIAGNOSIS — E119 Type 2 diabetes mellitus without complications: Secondary | ICD-10-CM | POA: Diagnosis not present

## 2023-02-11 DIAGNOSIS — Z87891 Personal history of nicotine dependence: Secondary | ICD-10-CM | POA: Diagnosis not present

## 2023-02-11 DIAGNOSIS — F17210 Nicotine dependence, cigarettes, uncomplicated: Secondary | ICD-10-CM | POA: Diagnosis not present

## 2023-02-11 DIAGNOSIS — Z122 Encounter for screening for malignant neoplasm of respiratory organs: Secondary | ICD-10-CM | POA: Diagnosis not present

## 2023-03-01 ENCOUNTER — Encounter: Payer: Self-pay | Admitting: Internal Medicine

## 2023-03-01 ENCOUNTER — Ambulatory Visit: Payer: Medicare Other | Admitting: Internal Medicine

## 2023-03-01 VITALS — BP 92/57 | HR 93 | Ht 66.0 in | Wt 186.0 lb

## 2023-03-01 DIAGNOSIS — R911 Solitary pulmonary nodule: Secondary | ICD-10-CM

## 2023-03-01 DIAGNOSIS — F17210 Nicotine dependence, cigarettes, uncomplicated: Secondary | ICD-10-CM | POA: Diagnosis not present

## 2023-03-01 DIAGNOSIS — J449 Chronic obstructive pulmonary disease, unspecified: Secondary | ICD-10-CM

## 2023-03-01 NOTE — Patient Instructions (Signed)
My office will be contacting you by phone for referral super D CT  - if you don't hear back from my office within one week please call us back or notify us thru MyChart and we'll address it right away.   Once we have this scheduled you should hear from Dr Myrlene Broker office in [place] about going ahead or not with navigational bronchoscopy and biopsy  The key is to stop smoking completely before smoking completely stops you!    Pulmonary follow up here is as needed

## 2023-03-01 NOTE — Progress Notes (Unsigned)
Christina Jenkins, female    DOB: [birth year]    MRN: [id number]   Brief patient profile:  89  yowf  active smoker with RA on Humira/areva/prednisone  DR Deveswar   referred to pulmonary clinic in [place]  03/01/2023 by Dr Dian Situ office  for abnormal LDSCT.     History of Present Illness  03/01/2023  Pulmonary/ 1st office eval/ Christina Jenkins / [place] Office  Chief Complaint  Patient presents with   Consult  Dyspnea:  flat surface ok / housework ok and has to stop and recover at top but flat surface nl pace = MMRC1   can walk nl pace, flat grade, can't hurry or go uphills or steps s sob   Cough: minimal dry  Sleep: flat bed/ 2 pillows no resp cc  SABA use: none  02: none  Arthritis 80% better on rx / stiffness in am's with gel phenomena   No obvious day to day or daytime pattern/variability or assoc excess/ purulent sputum or mucus plugs or hemoptysis or cp or chest tightness, subjective wheeze or overt sinus or hb symptoms.    Also denies any obvious fluctuation of symptoms with weather or environmental changes or other aggravating or alleviating factors except as outlined above   No unusual exposure hx or h/o childhood pna/ asthma or knowledge of premature birth.  Current Allergies, Complete Past Medical History, Past Surgical History, Family History, and Social History were reviewed in [employer] record.  ROS  The following are not active complaints unless bolded Hoarseness, sore throat, dysphagia, dental problems, itching, sneezing,  nasal congestion or discharge of excess mucus or purulent secretions, ear ache,   fever, chills, sweats, unintended wt loss or wt gain, classically pleuritic or exertional cp,  orthopnea pnd or arm/hand swelling  or leg swelling, presyncope, palpitations, abdominal pain, anorexia, nausea, vomiting, diarrhea  or change in bowel habits or change in bladder habits, change in stools or change in urine, dysuria, hematuria,  rash,  arthralgias, visual complaints, headache, numbness, weakness or ataxia or problems with walking or coordination,  change in mood or  memory.              Outpatient Medications Prior to Visit  Medication Sig Dispense Refill   Adalimumab (HUMIRA, 2 PEN,) 40 MG/0.4ML PNKT Inject 40 mg into the skin every 14 (fourteen) days. 1 kit - 2 pens 6 each 0   ARIPiprazole (ABILIFY) 2 MG tablet Take 2 mg by mouth daily.     cetirizine (ZYRTEC) 10 MG tablet Take 10 mg by mouth daily.     DULoxetine (CYMBALTA) 60 MG capsule Take 60 mg by mouth 2 (two) times daily.     furosemide (LASIX) 20 MG tablet Take 20 mg by mouth 2 (two) times daily.     leflunomide (ARAVA) 20 MG tablet TAKE 1 TABLET(20 MG) BY MOUTH DAILY 90 tablet 0   levETIRAcetam (KEPPRA XR) 500 MG 24 hr tablet Take 1 tablet (500 mg total) by mouth daily. 30 tablet 0   lisinopril (ZESTRIL) 10 MG tablet Take 10 mg by mouth daily.     metFORMIN (GLUCOPHAGE-XR) 500 MG 24 hr tablet Take 500 mg by mouth 2 (two) times daily.     Multiple Vitamin (MULTIVITAMIN) tablet Take 1 tablet by mouth daily.     Omega-3 Fatty Acids (FISH OIL) 1000 MG CAPS Take 1,000 mg by mouth daily.     potassium chloride SA (K-DUR) 20 MEQ tablet Take 20 mEq by mouth daily.  predniSONE (DELTASONE) 5 MG tablet TAKE 1 TABLET(5 MG) BY MOUTH DAILY WITH BREAKFAST 30 tablet 2   rosuvastatin (CRESTOR) 20 MG tablet Take 20 mg by mouth daily.     Facility-Administered Medications Prior to Visit  Medication Dose Route Frequency Provider Last Rate Last Admin   sodium chloride flush (NS) 0.9 % injection 3 mL  3 mL Intravenous Q12H Laqueta Linden, MD        Past Medical History:  Diagnosis Date   Carpal tunnel syndrome    COPD (chronic obstructive pulmonary disease) (HCC)    Coronary artery calcification seen on CT scan    Normal coronaries at cardiac catheterization 2020   Depression    Essential hypertension    Hepatitis C    Hypokalemia    Mixed hyperlipidemia     Rheumatoid arthritis (HCC)    Rheumatoid arthritis (HCC)    Seasonal allergies    Type 2 diabetes mellitus (HCC)    Varicose veins of legs       Objective:     BP (!) 92/57   Pulse 93   Ht 5\' 6"  (1.676 m)   Wt 186 lb (84.4 kg)   SpO2 93%   BMI 30.02 kg/m   SpO2: 93 % RA  Amb somber wf nad   HEENT : Oropharynx  clear/ edentulous     NECK :  without  apparent JVD/ palpable Nodes/TM    LUNGS: no acc muscle use,  Mild barrel  contour chest wall with bilateral  Distant bs s audible wheeze and  without cough on insp or exp maneuvers  and mild  Hyperresonant  to  percussion bilaterally     CV:  RRR  no s3 or murmur or increase in P2, and no edema   ABD:  soft and nontender    MS:  Nl gait/ ext warm without deformities Or obvious joint restrictions  calf tenderness, cyanosis or clubbing     SKIN: warm and dry without lesions    NEURO:  alert, approp, nl sensorium with  no motor or cerebellar deficits apparent.        Assessment   Solitary pulmonary nodule on lung CT Active smoker with RA on triple rx including prednisone - CT 02/11/23 in the anteromedial aspect of the left upper  lobe (axial image 99) there is a slow growing lesion which now has a  mixed solid and sub solid appearance, with an overall volume derived  mean diameter of 16.9 mm (previously 12.2 mm) and internal  developing solid components with a mean diameter of 9.1 mm, highly  concerning for slow growing primary bronchogenic adenocarcinoma.   Although she has MPN's they are all tiny and calcified c/w granuloma so no other evidence of RA nodules and there is no "safety in numbers" explanation for this nodule as is usually the case with RA lung dz so I think it's more likely than not a slow growing adenoca.  Too small a solid component for PET, could justify vats excisional bx but I favor navigational bx here is Dr Tonia Brooms agrees> will do super CD and have him review and schedule if he  agrees  Discussed in detail all the  indications, usual  risks and alternatives  relative to the benefits with patient who agrees to proceed with w/u as outlined.     COPD GOLD ? / group A Active smoker - CT chest 02/11/23 Mild diffuse bronchial wall thickening with mild centrilobular and paraseptal emphysema.  Minimally symptomatic s exac tendency.  Ideally needs pfts, needed prior to any resection  but certainly not prior to bronchocopy bx based on her hx and exam/ advised.   >>> saba prn approp but not needed for now          Each maintenance medication was reviewed in detail including emphasizing most importantly the difference between maintenance and prns and under what circumstances the prns are to be triggered using an action plan format where appropriate.  Total time for H and P, chart review, counseling,  and generating customized AVS unique to this office visit / same day charting  = 45 min new pt eval       Cigarette smoker 4-5 min discussion re active cigarette smoking in addition to office E&M  Ask about tobacco use:   ongoing  Advise quitting:  strongly advised esp prior to considering any lung surgery Assess willingness:  Not committed at this point/ husband says he's been working hard with her for 3 y Assist in quit attempt:  Per PCP when ready Arrange follow up:   Follow up per Primary Care planned         Sandrea Hughs, MD 03/01/2023

## 2023-03-02 ENCOUNTER — Encounter: Payer: Self-pay | Admitting: Internal Medicine

## 2023-03-02 ENCOUNTER — Telehealth: Payer: Self-pay | Admitting: Pulmonary Disease

## 2023-03-02 DIAGNOSIS — J449 Chronic obstructive pulmonary disease, unspecified: Secondary | ICD-10-CM | POA: Insufficient documentation

## 2023-03-02 DIAGNOSIS — R911 Solitary pulmonary nodule: Secondary | ICD-10-CM | POA: Insufficient documentation

## 2023-03-02 NOTE — Telephone Encounter (Signed)
PCCM:  Bronchoscopy requested by Dr. Sherene Sires.   Orders placed.   Super D CT has been scheduled for 19th   Next bronch  day after the 19th is Tuesday July 30th   Thanks  BLI   Josephine Igo, DO [facility] 03/02/2023 4:20 PM

## 2023-03-02 NOTE — Assessment & Plan Note (Signed)
4-5 min discussion re active cigarette smoking in addition to office E&M  Ask about tobacco use:   ongoing  Advise quitting:  strongly advised esp prior to considering any lung surgery Assess willingness:  Not committed at this point/ husband says he's been working hard with her for 3 y Assist in quit attempt:  Per PCP when ready Arrange follow up:   Follow up per Primary Care planned

## 2023-03-02 NOTE — Telephone Encounter (Signed)
-----   Message from Nyoka Cowden, MD sent at 03/02/2023  6:18 AM EDT ----- This lady has RA and smokes with a sub solid lesion slowly growing with issue ? Excisional bx vs NAV bx and has only mild copd clinically   I elected to do super CD CT and send her your way if you agree. I set the CT up but not the bronch -she's ok with meeting you there  thx

## 2023-03-02 NOTE — Assessment & Plan Note (Addendum)
Active smoker - CT chest 02/11/23 Mild diffuse bronchial wall thickening with mild centrilobular and paraseptal emphysema.   Minimally symptomatic s exac tendency.  Ideally needs pfts, needed prior to any resection  but certainly not prior to bronchocopy bx based on her hx and exam/ advised.   >>> saba prn approp but not needed for now          Each maintenance medication was reviewed in detail including emphasizing most importantly the difference between maintenance and prns and under what circumstances the prns are to be triggered using an action plan format where appropriate.  Total time for H and P, chart review, counseling,  and generating customized AVS unique to this office visit / same day charting  = 45 min new pt eval

## 2023-03-02 NOTE — Assessment & Plan Note (Addendum)
Active smoker with RA on triple rx including prednisone - CT 02/11/23 in the anteromedial aspect of the left upper  lobe (axial image 99) there is a slow growing lesion which now has a  mixed solid and sub solid appearance, with an overall volume derived  mean diameter of 16.9 mm (previously 12.2 mm) and internal  developing solid components with a mean diameter of 9.1 mm, highly  concerning for slow growing primary bronchogenic adenocarcinoma.   Although she has MPN's they are all tiny and calcified c/w granuloma so no other evidence of RA nodules and there is no "safety in numbers" explanation for this nodule as is usually the case with RA lung dz so I think it's more likely than not a slow growing adenoca.  Too small a solid component for PET, could justify vats excisional bx but I favor navigational bx here is Dr Tonia Brooms agrees> will do super CD and have him review and schedule if he agrees  Discussed in detail all the  indications, usual  risks and alternatives  relative to the benefits with patient who agrees to proceed with w/u as outlined.

## 2023-03-04 ENCOUNTER — Encounter: Payer: Self-pay | Admitting: Pulmonary Disease

## 2023-03-04 NOTE — Telephone Encounter (Signed)
I have scheduled bronch for 7/30.  Appt info in referral.  Appt info in referral.  Will close phone message.

## 2023-03-08 ENCOUNTER — Other Ambulatory Visit: Payer: Self-pay | Admitting: Physician Assistant

## 2023-03-11 ENCOUNTER — Other Ambulatory Visit: Payer: Self-pay | Admitting: Physician Assistant

## 2023-03-17 ENCOUNTER — Other Ambulatory Visit: Payer: Self-pay | Admitting: Rheumatology

## 2023-03-18 NOTE — Telephone Encounter (Signed)
Reviewed lab work from 01/09/23 and 01/21/23

## 2023-03-18 NOTE — Telephone Encounter (Signed)
Last Fill: 12/18/2022  Labs: 01/08/2021 Glucose 135    WBC 14.0    Neutrophils (Absolute) 12.6  Next Visit: 06/11/2023  Last Visit: 01/07/2023  DX: Rheumatoid arthritis with rheumatoid factor of multiple sites without organ or systems involvement   Current Dose per office note 01/07/2023: Arava 20 mg 1 tablet by mouth daily.   Okay to refill Arava ?

## 2023-03-21 ENCOUNTER — Other Ambulatory Visit: Payer: Self-pay | Admitting: *Deleted

## 2023-03-21 MED ORDER — PREDNISONE 5 MG PO TABS: ORAL_TABLET | ORAL | 2 refills | Status: DC

## 2023-03-21 NOTE — Telephone Encounter (Signed)
Patient states she needs a refill on her Prednisone. Patient states she also had a CT scan done as she has for several years. Patient states they saw a nodule. Patient states she has an appointment tomorrow at [facility] for a more sophisticated CT scan and then a biopsy on April 01, 2023. Patient states she has an appointment on 04/09/2023 to discuss the results. Patient states the pulmonologist thinks the nodule is coming from her RA.   Last Fill: 01/07/2023  Next Visit: 06/11/2023  Last Visit: 01/07/2023  Dx: Rheumatoid arthritis with rheumatoid factor of multiple sites without organ or systems involvement   Current Dose per office note on 01/07/2023: prednisone 5 mg 1 tablet daily   Okay to refill Prednisone?

## 2023-03-22 ENCOUNTER — Ambulatory Visit (HOSPITAL_COMMUNITY)
Admission: RE | Admit: 2023-03-22 | Discharge: 2023-03-22 | Disposition: A | Discharge status: Home / Self Care | Payer: Medicare Other | Source: Ambulatory Visit | Attending: Internal Medicine | Admitting: Internal Medicine

## 2023-03-22 DIAGNOSIS — R911 Solitary pulmonary nodule: Secondary | ICD-10-CM | POA: Diagnosis not present

## 2023-03-22 DIAGNOSIS — J439 Emphysema, unspecified: Secondary | ICD-10-CM | POA: Diagnosis not present

## 2023-03-22 DIAGNOSIS — I700 Atherosclerosis of aorta: Secondary | ICD-10-CM | POA: Diagnosis not present

## 2023-03-29 ENCOUNTER — Other Ambulatory Visit: Payer: Self-pay

## 2023-03-29 ENCOUNTER — Encounter (HOSPITAL_COMMUNITY): Payer: Self-pay | Admitting: Pulmonary Disease

## 2023-03-29 NOTE — Progress Notes (Signed)
SDW CALL  Patient was given pre-op instructions over the phone. The opportunity was given for the patient to ask questions. No further questions asked. Patient verbalized understanding of instructions given.   PCP - Amy Boyd,PA Cardiologist - Darrold Junker Neurologist - Dr. Pearlean Brownie- last seen in his office 12/04/2016.   PPM/ICD - denies Device Orders -  Rep Notified -   Chest x-ray - CT-03/22/2023 EKG - 04/09/2022 Stress Test - 04/16/22 ECHO - 04/10/19 Cardiac Cath - 04/30/19  Sleep Study - none CPAP -   Fasting Blood Sugar - Pt does not have a meter and does not check her blood sugar at home. Last A1C found in chart was 6.8 [id number]. Checks Blood Sugar _____ times a day  Blood Thinner Instructions:na Aspirin Instructions:na  ERAS Protcol -no PRE-SURGERY Ensure or G2-   COVID TEST- na   Anesthesia review: yes-hx- CAD,seizures. Pt states she was seeing a neurologist in [place],but couldn't remember the name. No notes or encounters found in chart since 2018. Pt said that the neurologist left [place] and she needs to find another one. Says she has a years worth of refills on her Sheralyn Boatman.   Patient denies shortness of breath, fever, cough and chest pain over the phone call    Surgical Instructions    Your procedure is scheduled on July 30  Report to [facility] Main Entrance "A" at 5:30 A.M., then check in with the Admitting office.  Call this number if you have problems the morning of surgery:  [phone]    Remember:  Do not eat or drink anything after midnight the night before your surgery   Take these medicines the morning of surgery with A SIP OF WATER:  Abilify,Zyrtec,Cymbalta,Kepra,Crestor,Arava,Prednisone   As of today, STOP taking any Aspirin (unless otherwise instructed by your surgeon) Aleve, Naproxen, Ibuprofen, Motrin, Advil, Goody's, BC's, all herbal medications, fish oil, and all vitamins.  [facility] is not responsible for any belongings or  valuables. .   Do NOT Smoke (Tobacco/Vaping)  24 hours prior to your procedure  If you use a CPAP at night, you may bring your mask for your overnight stay.   Contacts, glasses, hearing aids, dentures or partials may not be worn into surgery, please bring cases for these belongings   Patients discharged the day of surgery will not be allowed to drive home, and someone needs to stay with them for 24 hours.   Special instructions:    Oral Hygiene is also important to reduce your risk of infection.  Remember - BRUSH YOUR TEETH THE MORNING OF SURGERY WITH YOUR REGULAR TOOTHPASTE   Day of Surgery:  Take a shower the day of or night before with antibacterial soap. Wear Clean/Comfortable clothing the morning of surgery Do not apply any deodorants/lotions.   Do not wear jewelry or makeup Do not wear lotions, powders, perfumes/colognes, or deodorant. Do not shave 48 hours prior to surgery.  Men may shave face and neck. Do not bring valuables to the hospital. Do not wear nail polish, gel polish, artificial nails, or any other type of covering on natural nails (fingers and toes) If you have artificial nails or gel coating that need to be removed by a nail salon, please have this removed prior to surgery. Artificial nails or gel coating may interfere with anesthesia's ability to adequately monitor your vital signs. Remember to brush your teeth WITH YOUR REGULAR TOOTHPASTE.

## 2023-04-01 NOTE — Progress Notes (Signed)
Anesthesia Chart Review: Same day workup  History of rheumatoid arthritis, followed by rheumatology, currently maintained on Humira, Areva, and prednisone.  History of seizures, well-controlled on Keppra.  Follows with cardiology for history of HLD, HTN, coronary calcifications by CT imaging.  Catheterization in 2020 showed normal coronaries.  Last seen by Dr. Diona Browner 04/09/2022, recommended continue current medical therapy, annual follow-up.  Non-insulin-dependent DM2, on metformin only.  She is a current smoker was recently found to have an abnormal low-dose screening CT which was followed up with a high-resolution scan showing left upper lobe nodule concerning for primary lung adenocarcinoma.  She will need day of surgery labs and evaluation.  EKG 04/09/2022: NSR.  Rate 84.  Possible LAE.  Incomplete right bundle branch block.  HRCT chest 03/22/2023: IMPRESSION: 1. Part solid nodule of the left upper lobe is unchanged in size when compared with the prior exam and concerning for primary lung adenocarcinoma. 2. Coronary artery calcifications, aortic Atherosclerosis (ICD10-I70.0) and Emphysema (ICD10-J43.9).  Cath 04/30/2019: IMPRESSION: Ms. Rund has normal coronary arteries and mildly elevated filling pressures with an LVEDP of 26.  I believe her Myoview was false positive and her chest pain noncardiac.  The sheath was removed and a TR band was placed on the right wrist to achieve patent hemostasis.  The patient left lab in stable condition.  She will be discharged home later today as an outpatient and will follow up with Dr. Purvis Sheffield.     [facility] Short Stay Center/Anesthesiology Phone [phone] 04/01/2023 9:52 AM

## 2023-04-01 NOTE — Anesthesia Preprocedure Evaluation (Addendum)
Anesthesia Evaluation  Patient identified by MRN, date of birth, ID band Patient awake    Reviewed: Allergy & Precautions, H&P , NPO status , Patient's Chart, lab work & pertinent test results  Airway Mallampati: II  TM Distance: >3 FB Neck ROM: Full    Dental no notable dental hx. (+) Edentulous Upper, Edentulous Lower, Dental Advisory Given   Pulmonary COPD,  COPD inhaler, Current Smoker and Patient abstained from smoking.   Pulmonary exam normal breath sounds clear to auscultation       Cardiovascular Exercise Tolerance: Good hypertension, Pt. on medications  Rhythm:Regular Rate:Normal     Neuro/Psych Seizures -, Well Controlled,    Depression       GI/Hepatic negative GI ROS, Neg liver ROS,,,  Endo/Other  diabetes, Type 2, Oral Hypoglycemic Agents    Renal/GU negative Renal ROS  negative genitourinary   Musculoskeletal  (+) Arthritis , Rheumatoid disorders,    Abdominal   Peds  Hematology negative hematology ROS (+)   Anesthesia Other Findings   Reproductive/Obstetrics negative OB ROS                             Anesthesia Physical Anesthesia Plan  ASA: 3  Anesthesia Plan: General   Post-op Pain Management: Tylenol PO (pre-op)*   Induction: Intravenous  PONV Risk Score and Plan: 3 and Ondansetron, Dexamethasone, Propofol infusion, TIVA and Midazolam  Airway Management Planned: Oral ETT  Additional Equipment:   Intra-op Plan:   Post-operative Plan: Extubation in OR  Informed Consent: I have reviewed the patients History and Physical, chart, labs and discussed the procedure including the risks, benefits and alternatives for the proposed anesthesia with the patient or authorized representative who has indicated his/her understanding and acceptance.     Dental advisory given  Plan Discussed with: CRNA  Anesthesia Plan Comments: (PAT note by Antionette Poles, PA-C: History  of rheumatoid arthritis, followed by rheumatology, currently maintained on Humira, Areva, and prednisone.  History of seizures, well-controlled on Keppra.  Follows with cardiology for history of HLD, HTN, coronary calcifications by CT imaging.  Catheterization in 2020 showed normal coronaries.  Last seen by Dr. Diona Browner 04/09/2022, recommended continue current medical therapy, annual follow-up.  Non-insulin-dependent DM2, on metformin only.  She is a current smoker was recently found to have an abnormal low-dose screening CT which was followed up with a high-resolution scan showing left upper lobe nodule concerning for primary lung adenocarcinoma.  She will need day of surgery labs and evaluation.  EKG 04/09/2022: NSR.  Rate 84.  Possible LAE.  Incomplete right bundle branch block.  HRCT chest 03/22/2023: IMPRESSION: 1. Part solid nodule of the left upper lobe is unchanged in size when compared with the prior exam and concerning for primary lung adenocarcinoma. 2. Coronary artery calcifications, aortic Atherosclerosis (ICD10-I70.0) and Emphysema (ICD10-J43.9).  Cath 04/30/2019: IMPRESSION: Ms. Bedonie has normal coronary arteries and mildly elevated filling pressures with an LVEDP of 26.  I believe her Myoview was false positive and her chest pain noncardiac.  The sheath was removed and a TR band was placed on the right wrist to achieve patent hemostasis.  The patient left lab in stable condition.  She will be discharged home later today as an outpatient and will follow up with Dr. Purvis Sheffield.    )        Anesthesia Quick Evaluation

## 2023-04-02 ENCOUNTER — Ambulatory Visit (HOSPITAL_COMMUNITY): Payer: Medicare Other | Admitting: Physician Assistant

## 2023-04-02 ENCOUNTER — Other Ambulatory Visit: Payer: Self-pay

## 2023-04-02 ENCOUNTER — Ambulatory Visit (HOSPITAL_COMMUNITY): Payer: Medicare Other

## 2023-04-02 ENCOUNTER — Ambulatory Visit (HOSPITAL_BASED_OUTPATIENT_CLINIC_OR_DEPARTMENT_OTHER): Payer: Medicare Other | Admitting: Physician Assistant

## 2023-04-02 ENCOUNTER — Encounter (HOSPITAL_COMMUNITY)
Admission: RE | Disposition: A | Discharge status: Home / Self Care | Payer: Self-pay | Source: Home / Self Care | Attending: Pulmonary Disease

## 2023-04-02 ENCOUNTER — Encounter (HOSPITAL_COMMUNITY): Payer: Self-pay | Admitting: Pulmonary Disease

## 2023-04-02 ENCOUNTER — Ambulatory Visit (HOSPITAL_COMMUNITY)
Admission: RE | Admit: 2023-04-02 | Discharge: 2023-04-02 | Disposition: A | Discharge status: Home / Self Care | Payer: Medicare Other | Attending: Pulmonary Disease | Admitting: Pulmonary Disease

## 2023-04-02 DIAGNOSIS — Z7984 Long term (current) use of oral hypoglycemic drugs: Secondary | ICD-10-CM | POA: Diagnosis not present

## 2023-04-02 DIAGNOSIS — I10 Essential (primary) hypertension: Secondary | ICD-10-CM | POA: Insufficient documentation

## 2023-04-02 DIAGNOSIS — R911 Solitary pulmonary nodule: Secondary | ICD-10-CM | POA: Diagnosis present

## 2023-04-02 DIAGNOSIS — J449 Chronic obstructive pulmonary disease, unspecified: Secondary | ICD-10-CM | POA: Diagnosis not present

## 2023-04-02 DIAGNOSIS — J439 Emphysema, unspecified: Secondary | ICD-10-CM | POA: Insufficient documentation

## 2023-04-02 DIAGNOSIS — C3412 Malignant neoplasm of upper lobe, left bronchus or lung: Secondary | ICD-10-CM | POA: Diagnosis not present

## 2023-04-02 DIAGNOSIS — E119 Type 2 diabetes mellitus without complications: Secondary | ICD-10-CM | POA: Insufficient documentation

## 2023-04-02 DIAGNOSIS — R918 Other nonspecific abnormal finding of lung field: Secondary | ICD-10-CM | POA: Diagnosis present

## 2023-04-02 DIAGNOSIS — F17210 Nicotine dependence, cigarettes, uncomplicated: Secondary | ICD-10-CM | POA: Diagnosis not present

## 2023-04-02 DIAGNOSIS — Z48813 Encounter for surgical aftercare following surgery on the respiratory system: Secondary | ICD-10-CM | POA: Diagnosis not present

## 2023-04-02 DIAGNOSIS — C3432 Malignant neoplasm of lower lobe, left bronchus or lung: Secondary | ICD-10-CM | POA: Insufficient documentation

## 2023-04-02 HISTORY — PX: BRONCHIAL NEEDLE ASPIRATION BIOPSY: SHX5106

## 2023-04-02 HISTORY — PX: FIDUCIAL MARKER PLACEMENT: SHX6858

## 2023-04-02 HISTORY — PX: BRONCHIAL BIOPSY: SHX5109

## 2023-04-02 HISTORY — PX: BRONCHIAL BRUSHINGS: SHX5108

## 2023-04-02 LAB — COMPREHENSIVE METABOLIC PANEL
ALT: 15 U/L (ref 0–44)
AST: 13 U/L — ABNORMAL LOW (ref 15–41)
Albumin: 3.2 g/dL — ABNORMAL LOW (ref 3.5–5.0)
Alkaline Phosphatase: 60 U/L (ref 38–126)
Anion gap: 10 (ref 5–15)
BUN: 13 mg/dL (ref 8–23)
CO2: 23 mmol/L (ref 22–32)
Calcium: 8.8 mg/dL — ABNORMAL LOW (ref 8.9–10.3)
Chloride: 103 mmol/L (ref 98–111)
Creatinine, Ser: 0.68 mg/dL (ref 0.44–1.00)
GFR, Estimated: >60 mL/min (ref >60)
Glucose, Bld: 112 mg/dL — ABNORMAL HIGH (ref 70–99)
Potassium: 3.5 mmol/L (ref 3.5–5.1)
Sodium: 136 mmol/L (ref 135–145)
Total Bilirubin: 0.6 mg/dL (ref 0.3–1.2)
Total Protein: 6.1 g/dL — ABNORMAL LOW (ref 6.5–8.1)

## 2023-04-02 LAB — GLUCOSE, CAPILLARY
Glucose-Capillary: 114 mg/dL — ABNORMAL HIGH (ref 70–99)
Glucose-Capillary: 100 mg/dL — ABNORMAL HIGH (ref 70–99)

## 2023-04-02 LAB — CBC
HCT: 38.5 % (ref 36.0–46.0)
Hemoglobin: 12.5 g/dL (ref 12.0–15.0)
MCH: 29.8 pg (ref 26.0–34.0)
MCHC: 32.5 g/dL (ref 30.0–36.0)
MCV: 91.9 fL (ref 80.0–100.0)
Platelets: 199 10*3/uL (ref 150–400)
RBC: 4.19 MIL/uL (ref 3.87–5.11)
RDW: 11.8 % (ref 11.5–15.5)
WBC: 11 10*3/uL — ABNORMAL HIGH (ref 4.0–10.5)
nRBC: 0 % (ref 0.0–0.2)

## 2023-04-02 SURGERY — BRONCHOSCOPY, WITH BIOPSY USING ELECTROMAGNETIC NAVIGATION
Anesthesia: General | Laterality: Bilateral

## 2023-04-02 MED ORDER — INSULIN ASPART 100 UNIT/ML IJ SOLN: 0.0000 [IU] | INTRAMUSCULAR | Status: DC | PRN

## 2023-04-02 MED ORDER — ONDANSETRON HCL 4 MG/2ML IJ SOLN
INTRAMUSCULAR | Status: DC | PRN
  Administered 2023-04-02: 4 mg via INTRAVENOUS

## 2023-04-02 MED ORDER — PROPOFOL 500 MG/50ML IV EMUL
INTRAVENOUS | Status: DC | PRN
  Administered 2023-04-02: 150 ug/kg/min via INTRAVENOUS

## 2023-04-02 MED ORDER — ACETAMINOPHEN 500 MG PO TABS
1000.0000 mg | ORAL_TABLET | Freq: Once | ORAL | Status: AC
  Administered 2023-04-02: 1000 mg via ORAL
  Filled 2023-04-02: qty 2

## 2023-04-02 MED ORDER — ROCURONIUM BROMIDE 10 MG/ML (PF) SYRINGE
PREFILLED_SYRINGE | INTRAVENOUS | Status: DC | PRN
  Administered 2023-04-02: 60 mg via INTRAVENOUS

## 2023-04-02 MED ORDER — DEXAMETHASONE SODIUM PHOSPHATE 10 MG/ML IJ SOLN
INTRAMUSCULAR | Status: DC | PRN
  Administered 2023-04-02: 10 mg via INTRAVENOUS

## 2023-04-02 MED ORDER — PHENYLEPHRINE HCL-NACL 20-0.9 MG/250ML-% IV SOLN
INTRAVENOUS | Status: DC | PRN
  Administered 2023-04-02: 25 ug/min via INTRAVENOUS

## 2023-04-02 MED ORDER — SUGAMMADEX SODIUM 200 MG/2ML IV SOLN
INTRAVENOUS | Status: DC | PRN
  Administered 2023-04-02: 167.8 mg via INTRAVENOUS

## 2023-04-02 MED ORDER — LIDOCAINE 2% (20 MG/ML) 5 ML SYRINGE
INTRAMUSCULAR | Status: DC | PRN
  Administered 2023-04-02: 80 mg via INTRAVENOUS

## 2023-04-02 MED ORDER — CHLORHEXIDINE GLUCONATE 0.12 % MT SOLN
OROMUCOSAL | Status: AC
  Administered 2023-04-02: 15 mL
  Filled 2023-04-02: qty 15

## 2023-04-02 MED ORDER — LACTATED RINGERS IV SOLN: INTRAVENOUS | Status: DC

## 2023-04-02 MED ORDER — FENTANYL CITRATE (PF) 100 MCG/2ML IJ SOLN: 25.0000 ug | INTRAMUSCULAR | Status: DC | PRN

## 2023-04-02 MED ORDER — PROPOFOL 10 MG/ML IV BOLUS
INTRAVENOUS | Status: DC | PRN
Start: 2023-04-02 — End: 2023-04-02
  Administered 2023-04-02: 140 mg via INTRAVENOUS

## 2023-04-02 SURGICAL SUPPLY — 1 items: Superlock fiducial marker IMPLANT

## 2023-04-02 NOTE — H&P (Signed)
Synopsis: Referred in July 2024 for lung nodule by No ref. provider found  Subjective:   PATIENT ID: Christina Jenkins GENDER: female DOB: August 28, 1961, MRN: [id number]  61 yo FM, seen today for enlarging lung nodule. History of RA and tobacco use. Patient was referred by Dr. Sherene Sires for navigational bronchoscopy.   Past Medical History:  Diagnosis Date   Carpal tunnel syndrome    COPD (chronic obstructive pulmonary disease) (HCC)    Coronary artery calcification seen on CT scan    Normal coronaries at cardiac catheterization 2020   Depression    Essential hypertension    Hepatitis C    Hypokalemia    Mixed hyperlipidemia    Rheumatoid arthritis (HCC)    Rheumatoid arthritis (HCC)    Seasonal allergies    Type 2 diabetes mellitus (HCC)    Varicose veins of legs      Family History  Problem Relation Age of Onset   Diabetes Mother    Pancreatic cancer Mother    Diabetes Father    COPD Father    Dementia Father    Diabetes Brother      Past Surgical History:  Procedure Laterality Date   BACK SURGERY     LEFT HEART CATH AND CORONARY ANGIOGRAPHY N/A 04/30/2019   Procedure: LEFT HEART CATH AND CORONARY ANGIOGRAPHY;  Surgeon: Runell Gess, MD;  Location: [facility] INVASIVE [facility] LAB;  Service: Cardiovascular;  Laterality: N/A;   TONSILLECTOMY     TUBAL LIGATION      Social History   Socioeconomic History   Marital status: Married    Spouse name: Not on file   Number of children: Not on file   Years of education: Not on file   Highest education level: Not on file  Occupational History   Not on file  Tobacco Use   Smoking status: Every Day    Current packs/day: 3.00    Average packs/day: 3.0 packs/day for 42.5 years (127.4 ttl pk-yrs)    Types: Cigarettes    Start date: 10/18/1980    Passive exposure: Past   Smokeless tobacco: Never  Vaping Use   Vaping status: Never Used  Substance and Sexual Activity   Alcohol use: No   Drug use: No   Sexual activity: Not on file   Other Topics Concern   Not on file  Social History Narrative   Not on file   Social Determinants of Health   Financial Resource Strain: Not on file  Food Insecurity: Not on file  Transportation Needs: Not on file  Physical Activity: Not on file  Stress: Not on file  Social Connections: Not on file  Intimate Partner Violence: Not on file     No Known Allergies   @ENCMEDSTART @  Review of Systems  Constitutional:  Negative for chills, fever, malaise/fatigue and weight loss.  HENT:  Negative for hearing loss, sore throat and tinnitus.   Eyes:  Negative for blurred vision and double vision.  Respiratory:  Negative for cough, hemoptysis, sputum production, shortness of breath, wheezing and stridor.   Cardiovascular:  Negative for chest pain, palpitations, orthopnea, leg swelling and PND.  Gastrointestinal:  Negative for abdominal pain, constipation, diarrhea, heartburn, nausea and vomiting.  Genitourinary:  Negative for dysuria, hematuria and urgency.  Musculoskeletal:  Negative for joint pain and myalgias.  Skin:  Negative for itching and rash.  Neurological:  Negative for dizziness, tingling, weakness and headaches.  Endo/Heme/Allergies:  Negative for environmental allergies. Does not bruise/bleed easily.  Psychiatric/Behavioral:  Negative for depression. The patient is not nervous/anxious and does not have insomnia.   All other systems reviewed and are negative.    Objective:  Physical Exam Vitals reviewed.  Constitutional:      General: She is not in acute distress.    Appearance: She is well-developed.  HENT:     Head: Normocephalic and atraumatic.  Eyes:     General: No scleral icterus.    Conjunctiva/sclera: Conjunctivae normal.     Pupils: Pupils are equal, round, and reactive to light.  Neck:     Vascular: No JVD.     Trachea: No tracheal deviation.  Cardiovascular:     Rate and Rhythm: Normal rate and regular rhythm.     Heart sounds: Normal heart sounds.  No murmur heard. Pulmonary:     Effort: Pulmonary effort is normal. No tachypnea, accessory muscle usage or respiratory distress.     Breath sounds: No stridor. No wheezing, rhonchi or rales.  Abdominal:     General: Bowel sounds are normal. There is no distension.     Palpations: Abdomen is soft.     Tenderness: There is no abdominal tenderness.  Musculoskeletal:        General: No tenderness.     Cervical back: Neck supple.  Lymphadenopathy:     Cervical: No cervical adenopathy.  Skin:    General: Skin is warm and dry.     Capillary Refill: Capillary refill takes less than 2 seconds.     Findings: No rash.  Neurological:     Mental Status: She is alert and oriented to person, place, and time.  Psychiatric:        Behavior: Behavior normal.      Vitals:   04/02/23 0545  BP: (!) 104/46  Pulse: 82  Resp: 18  Temp: 98.5 F (36.9 C)  TempSrc: Oral  SpO2: 95%  Weight: 83.9 kg  Height: 5\' 6"  (1.676 m)   95% on RA BMI Readings from Last 3 Encounters:  04/02/23 29.86 kg/m  03/01/23 30.02 kg/m  01/07/23 30.02 kg/m   Wt Readings from Last 3 Encounters:  04/02/23 83.9 kg  03/01/23 84.4 kg  01/07/23 84.4 kg     CBC    Component Value Date/Time   WBC 11.0 (H) 04/02/2023 0600   RBC 4.19 04/02/2023 0600   HGB 12.5 04/02/2023 0600   HCT 38.5 04/02/2023 0600   PLT 199 04/02/2023 0600   MCV 91.9 04/02/2023 0600   MCH 29.8 04/02/2023 0600   MCHC 32.5 04/02/2023 0600   RDW 11.8 04/02/2023 0600   LYMPHSABS 1,556 12/09/2019 1323   MONOABS 0.8 06/26/2018 1035   EOSABS 162 12/09/2019 1323   BASOSABS 94 12/09/2019 1323     Chest Imaging: 03/22/2023 CT Chest: Lung nodule  LUL The patient's images have been independently reviewed by me.    Pulmonary Functions Testing Results:     No data to display          FeNO:   Pathology:   Echocardiogram:   Heart Catheterization:     Assessment & Plan:   LUL nodule   Discussion:  Planned outpatient RAB  with bx  Patient is agreeable to proceed  Discussed risk benefits and alternatives Husband ricky present too.    Josephine Igo, DO [facility] 04/02/2023 7:23 AM

## 2023-04-02 NOTE — Op Note (Addendum)
Video Bronchoscopy with Robotic Assisted Bronchoscopic Navigation   Date of Operation: 04/02/2023   Pre-op Diagnosis: lung nodules  Post-op Diagnosis: lung nodules   Surgeon: Josephine Igo, DO   Assistants: None   Anesthesia: General endotracheal anesthesia  Operation: Flexible video fiberoptic bronchoscopy with robotic assistance and biopsies.  Estimated Blood Loss: Minimal  Complications: None  Indications and History: Christina Jenkins is a 62 y.o. female with history of lung nodules. The risks, benefits, complications, treatment options and expected outcomes were discussed with the patient.  The possibilities of pneumothorax, pneumonia, reaction to medication, pulmonary aspiration, perforation of a viscus, bleeding, failure to diagnose a condition and creating a complication requiring transfusion or operation were discussed with the patient who freely signed the consent.    Description of Procedure: The patient was seen in the Preoperative Area, was examined and was deemed appropriate to proceed.  The patient was taken to [facility] endoscopy room 3, identified as Christina Jenkins and the procedure verified as Flexible Video Fiberoptic Bronchoscopy.  A Time Out was held and the above information confirmed.   Prior to the date of the procedure a high-resolution CT scan of the chest was performed. Utilizing ION software program a virtual tracheobronchial tree was generated to allow the creation of distinct navigation pathways to the patient's parenchymal abnormalities. After being taken to the operating room general anesthesia was initiated and the patient  was orally intubated. The video fiberoptic bronchoscope was introduced via the endotracheal tube and a general inspection was performed which showed normal right and left lung anatomy, aspiration of the bilateral mainstems was completed to remove any remaining secretions. Robotic catheter inserted into patient's endotracheal tube.   Target #1  LUL nodule The distinct navigation pathways prepared prior to this procedure were then utilized to navigate to patient's lesion identified on CT scan. The robotic catheter was secured into place and the vision probe was withdrawn.  Lesion location was approximated using fluoroscopy and three-dimensional cone beam CT imaging for peripheral targeting. Under fluoroscopic guidance transbronchial needle brushings, transbronchial needle biopsies, and transbronchial forceps biopsies were performed to be sent for cytology and pathology. Following tissue sampling we placed single fiducial under fluoroscopy.   At the end of the procedure a general airway inspection was performed and there was no evidence of active bleeding. The bronchoscope was removed.  The patient tolerated the procedure well. There was no significant blood loss and there were no obvious complications. A post-procedural chest x-ray is pending.  Samples Target #1: 1. Transbronchial needle brushings from LUL 2. Transbronchial Wang needle biopsies from LUL 3. Transbronchial forceps biopsies from LUL  Plans:  The patient will be discharged from the PACU to home when recovered from anesthesia and after chest x-ray is reviewed. We will review the cytology, pathology results with the patient when they become available. Outpatient followup will be with Josephine Igo, DO.  Josephine Igo, DO [facility] 04/02/2023 8:37 AM

## 2023-04-02 NOTE — Discharge Instructions (Signed)
Flexible Bronchoscopy, Care After This sheet gives you information about how to care for yourself after your test. Your doctor may also give you more specific instructions. If you have problems or questions, contact your doctor. Follow these instructions at home: Eating and drinking Do not eat or drink anything (not even water) for 2 hours after your test, or until your numbing medicine (local anesthetic) wears off. When your numbness is gone and your cough and gag reflexes have come back, you may: Eat only soft foods. Slowly drink liquids. The day after the test, go back to your normal diet. Driving Do not drive for 24 hours if you were given a medicine to help you relax (sedative). Do not drive or use heavy machinery while taking prescription pain medicine. General instructions  Take over-the-counter and prescription medicines only as told by your doctor. Return to your normal activities as told. Ask what activities are safe for you. Do not use any products that have nicotine or tobacco in them. This includes cigarettes and e-cigarettes. If you need help quitting, ask your doctor. Keep all follow-up visits as told by your doctor. This is important. It is very important if you had a tissue sample (biopsy) taken. Get help right away if: You have shortness of breath that gets worse. You get light-headed. You feel like you are going to pass out (faint). You have chest pain. You cough up: More than a little blood. More blood than before. Summary Do not eat or drink anything (not even water) for 2 hours after your test, or until your numbing medicine wears off. Do not use cigarettes. Do not use e-cigarettes. Get help right away if you have chest pain.  This information is not intended to replace advice given to you by your health care provider. Make sure you discuss any questions you have with your health care provider. Document Released: 06/17/2009 Document Revised: 08/02/2017 Document  Reviewed: 09/07/2016 Elsevier Patient Education  2020 Elsevier Inc.  

## 2023-04-02 NOTE — Interval H&P Note (Signed)
History and Physical Interval Note:  04/02/2023 7:27 AM  Christina Jenkins  has presented today for surgery, with the diagnosis of lung nodule.  The various methods of treatment have been discussed with the patient and family. After consideration of risks, benefits and other options for treatment, the patient has consented to  Procedure(s): ROBOTIC ASSISTED NAVIGATIONAL BRONCHOSCOPY (Bilateral) as a surgical intervention.  The patient's history has been reviewed, patient examined, no change in status, stable for surgery.  I have reviewed the patient's chart and labs.  Questions were answered to the patient's satisfaction.     [name]

## 2023-04-02 NOTE — Anesthesia Postprocedure Evaluation (Signed)
Anesthesia Post Note  Patient: Christina Jenkins  Procedure(s) Performed: ROBOTIC ASSISTED NAVIGATIONAL BRONCHOSCOPY (Bilateral) FIDUCIAL MARKER PLACEMENT BRONCHIAL BRUSHINGS BRONCHIAL NEEDLE ASPIRATION BIOPSIES BRONCHIAL BIOPSIES     Patient location during evaluation: PACU Anesthesia Type: General Level of consciousness: awake and alert Pain management: pain level controlled Vital Signs Assessment: post-procedure vital signs reviewed and stable Respiratory status: spontaneous breathing, nonlabored ventilation and respiratory function stable Cardiovascular status: blood pressure returned to baseline and stable Postop Assessment: no apparent nausea or vomiting Anesthetic complications: no  No notable events documented.  Last Vitals:  Vitals:   04/02/23 0915 04/02/23 0930  BP: 115/70   Pulse: 81 81  Resp: 20 19  Temp:    SpO2: 95% 93%    Last Pain:  Vitals:   04/02/23 0900  TempSrc:   PainSc: 0-No pain                 May Ozment,W. EDMOND

## 2023-04-02 NOTE — Transfer of Care (Signed)
Immediate Anesthesia Transfer of Care Note  Patient: Christina Jenkins  Procedure(s) Performed: ROBOTIC ASSISTED NAVIGATIONAL BRONCHOSCOPY (Bilateral) FIDUCIAL MARKER PLACEMENT BRONCHIAL BRUSHINGS BRONCHIAL NEEDLE ASPIRATION BIOPSIES BRONCHIAL BIOPSIES  Patient Location: PACU  Anesthesia Type:General  Level of Consciousness: awake, alert , and oriented  Airway & Oxygen Therapy: Patient Spontanous Breathing and Patient connected to nasal cannula oxygen  Post-op Assessment: Report given to RN, Post -op Vital signs reviewed and stable, Patient moving all extremities X 4, and Patient able to stick tongue midline  Post vital signs: Reviewed and stable  Last Vitals:  Vitals Value Taken Time  BP 108/87 04/02/23 0827  Temp 97.8   Pulse 73 04/02/23 0828  Resp 11 04/02/23 0828  SpO2 97 % 04/02/23 0828  Vitals shown include unfiled device data.  Last Pain:  Vitals:   04/02/23 0634  TempSrc:   PainSc: 0-No pain         Complications: No notable events documented.

## 2023-04-02 NOTE — Progress Notes (Signed)
Done- appt scheduled for 04/11/23 in [place]  Appt with Maralyn Sago was cancelled

## 2023-04-02 NOTE — Anesthesia Procedure Notes (Signed)
Procedure Name: Intubation Date/Time: 04/02/2023 7:41 AM  Performed by: Cy Blamer, CRNAPre-anesthesia Checklist: Patient identified, Emergency Drugs available, Suction available and Patient being monitored Patient Re-evaluated:Patient Re-evaluated prior to induction Oxygen Delivery Method: Circle system utilized Preoxygenation: Pre-oxygenation with 100% oxygen Induction Type: IV induction Ventilation: Mask ventilation without difficulty Laryngoscope Size: Miller and 2 Grade View: Grade I Tube type: Oral Tube size: 7.0 mm Number of attempts: 1 Airway Equipment and Method: Stylet Placement Confirmation: ETT inserted through vocal cords under direct vision, positive ETCO2 and breath sounds checked- equal and bilateral Secured at: 20 cm Tube secured with: Tape Dental Injury: Teeth and Oropharynx as per pre-operative assessment

## 2023-04-04 NOTE — Progress Notes (Unsigned)
Christina Jenkins, female    DOB: [birth year]    MRN: [id number]   Brief patient profile:  49  yowf  active smoker with RA on Humira/areva/prednisone  DR Deveswar   referred to pulmonary clinic in [place]  03/01/2023 by Dr Dian Situ office  for abnormal LDSCT.     History of Present Illness  03/01/2023  Pulmonary/ 1st office eval/  / [place] [facility]  Chief Complaint  Patient presents with   Consult  Dyspnea:  flat surface ok / housework ok and has to stop and recover at top but flat surface nl pace = MMRC1   can walk nl pace, flat grade, can't hurry or go uphills or steps s sob   Cough: minimal dry  Sleep: flat bed/ 2 pillows no resp cc  SABA use: none  02: none  Arthritis 80% better on rx / stiffness in am's with gel phenomena  Rec My office will be contacting you by phone for referral super D CT > bx 04/02/23  The key is to stop smoking completely before smoking completely stops you!    04/05/2023  f/u ov/[place] office/ re: SPN bx 04/02/23 /GROUP A COPD ? GOLD stage  maint on Prednisone 5 mg daily   Chief Complaint  Patient presents with   Lung Nodule  Dyspnea:  MMRC1 = can walk nl pace, flat grade, can't hurry or go uphills or steps s sob   Cough: none  Sleeping: flat bed/ 2 pillows no resp cc  SABA use: none  02: none      No obvious day to day or daytime variability or assoc excess/ purulent sputum or mucus plugs or hemoptysis or cp or chest tightness, subjective wheeze or overt sinus or hb symptoms.   Sleeping  without nocturnal  or early am exacerbation  of respiratory  c/o's or need for noct saba. Also denies any obvious fluctuation of symptoms with weather or environmental changes or other aggravating or alleviating factors except as outlined above   No unusual exposure hx or h/o childhood pna/ asthma or knowledge of premature birth.  Current Allergies, Complete Past Medical History, Past Surgical History, Family History, and Social History were reviewed in  [employer] [facility] record.  ROS  The following are not active complaints unless bolded Hoarseness, sore throat, dysphagia, dental problems, itching, sneezing,  nasal congestion or discharge of excess mucus or purulent secretions, ear ache,   fever, chills, sweats, unintended wt loss or wt gain, classically pleuritic or exertional cp,  orthopnea pnd or arm/hand swelling  or leg swelling, presyncope, palpitations, abdominal pain, anorexia, nausea, vomiting, diarrhea  or change in bowel habits or change in bladder habits, change in stools or change in urine, dysuria, hematuria,  rash, arthralgias, visual complaints, headache, numbness, weakness or ataxia or problems with walking or coordination,  change in mood or  memory.        Current Meds  Medication Sig   Adalimumab (HUMIRA, 2 PEN,) 40 MG/0.4ML PNKT Inject 40 mg into the skin every 14 (fourteen) days. 1 kit - 2 pens (Patient taking differently: Inject 40 mg into the skin every 14 (fourteen) days. 1 kit - 2 pens (Every other Wednesday))   cetirizine (ZYRTEC) 10 MG tablet Take 10 mg by mouth at bedtime.   DULoxetine (CYMBALTA) 60 MG capsule Take 120 mg by mouth at bedtime.   furosemide (LASIX) 20 MG tablet Take 40 mg by mouth in the morning.   ibuprofen (ADVIL) 200 MG tablet Take  400 mg by mouth every 8 (eight) hours as needed (pain.).   leflunomide (ARAVA) 20 MG tablet TAKE 1 TABLET(20 MG) BY MOUTH DAILY (Patient taking differently: Take 20 mg by mouth every evening.)   levETIRAcetam (KEPPRA XR) 500 MG 24 hr tablet Take 1 tablet (500 mg total) by mouth daily. (Patient taking differently: Take 500 mg by mouth at bedtime.)   lisinopril (ZESTRIL) 10 MG tablet Take 10 mg by mouth at bedtime.   metFORMIN (GLUCOPHAGE-XR) 500 MG 24 hr tablet Take 1,000 mg by mouth daily after supper.   Multiple Vitamin (MULTIVITAMIN) tablet Take 1 tablet by mouth at bedtime.   Potassium Chloride ER 20 MEQ TBCR Take 20 mEq by mouth at bedtime.    predniSONE (DELTASONE) 5 MG tablet TAKE 1 TABLET(5 MG) BY MOUTH DAILY WITH BREAKFAST   rosuvastatin (CRESTOR) 20 MG tablet Take 20 mg by mouth at bedtime.   Current Facility-Administered Medications for the 04/05/23 encounter (Office Visit) with Nyoka Cowden, MD  Medication   sodium chloride flush (NS) 0.9 % injection 3 mL           Past Medical History:  Diagnosis Date   Carpal tunnel syndrome    COPD (chronic obstructive pulmonary disease) (HCC)    Coronary artery calcification seen on CT scan    Normal coronaries at cardiac catheterization 2020   Depression    Essential hypertension    Hepatitis C    Hypokalemia    Mixed hyperlipidemia    Rheumatoid arthritis (HCC)    Rheumatoid arthritis (HCC)    Seasonal allergies    Type 2 diabetes mellitus (HCC)    Varicose veins of legs       Objective:       Wt Readings from Last 3 Encounters:  04/05/23 186 lb (84.4 kg)  04/02/23 185 lb (83.9 kg)  03/01/23 186 lb (84.4 kg)      Vital signs reviewed  04/05/2023  - Note at rest 02 sats  92% on RA   General appearance:    somber amb wf nad      HEENT : Oropharynx  clear       NECK :  without  apparent JVD/ palpable Nodes/TM    LUNGS: no acc muscle use,  Min barrel  contour chest wall with bilateral  slightly decreased bs s audible wheeze and  without cough on insp or exp maneuvers and min  Hyperresonant  to  percussion bilaterally    CV:  RRR  no s3 or murmur or increase in P2, and no edema   ABD:  soft and nontender with pos end  insp Hoover's  in the supine position.  No bruits or organomegaly appreciated   MS:  Nl gait/ ext warm without deformities Or obvious joint restrictions  calf tenderness, cyanosis or clubbing     SKIN: warm and dry without lesions    NEURO:  alert, approp, nl sensorium with  no motor or cerebellar deficits apparent.            Assessment

## 2023-04-05 ENCOUNTER — Encounter: Payer: Self-pay | Admitting: Internal Medicine

## 2023-04-05 ENCOUNTER — Ambulatory Visit (INDEPENDENT_AMBULATORY_CARE_PROVIDER_SITE_OTHER): Payer: Medicare Other | Admitting: Internal Medicine

## 2023-04-05 VITALS — BP 110/69 | HR 90 | Ht 66.0 in | Wt 186.0 lb

## 2023-04-05 DIAGNOSIS — J449 Chronic obstructive pulmonary disease, unspecified: Secondary | ICD-10-CM | POA: Diagnosis not present

## 2023-04-05 DIAGNOSIS — F17210 Nicotine dependence, cigarettes, uncomplicated: Secondary | ICD-10-CM | POA: Diagnosis not present

## 2023-04-05 DIAGNOSIS — R911 Solitary pulmonary nodule: Secondary | ICD-10-CM

## 2023-04-05 NOTE — Patient Instructions (Addendum)
PFTs next available  I will call you as soon as I have any results on the biopsy.

## 2023-04-06 NOTE — Assessment & Plan Note (Signed)
4-5 min discussion re active cigarette smoking in addition to office E&M  Ask about tobacco use:   ongoing Advise quitting   I took an extended  opportunity with this patient to outline the consequences of continued cigarette use  in airway disorders based on all the data we have from the multiple national lung health studies (perfomed over decades at millions of dollars in cost)  indicating that smoking cessation, not choice of inhalers or pulmonary physicians, is the most important aspect of her  care and would certainly need to stop in the short run at least if being considered for surgery. Assess willingness: ?  committed at this point Assist in quit attempt:  Per PCP when ready Arrange follow up:   Follow up per Primary Care planned

## 2023-04-06 NOTE — Assessment & Plan Note (Addendum)
Active smoker - CT chest 02/11/23 Mild diffuse bronchial wall thickening with mild centrilobular and paraseptal emphysema.  - 04/05/2023   Walked on RA  x  3  lap(s) =  approx 450  ft  @ brisk pace, stopped due to end of study  with lowest 02 sats 94%   Pfts ordered 1st available/ appears to me to be candidate for LULobectomy if needed           Each maintenance medication was reviewed in detail including emphasizing most importantly the difference between maintenance and prns and under what circumstances the prns are to be triggered using an action plan format where appropriate.  Total time for H and P, chart review, counseling, and generating customized AVS unique to this office visit / same day charting = 30 min

## 2023-04-06 NOTE — Assessment & Plan Note (Signed)
Active smoker with RA on triple rx including prednisone - CT 02/11/23 in the anteromedial aspect of the left upper  lobe (axial image 99) there is a slow growing lesion which now has a  mixed solid and sub solid appearance, with an overall volume derived  mean diameter of 16.9 mm (previously 12.2 mm) and internal  developing solid components with a mean diameter of 9.1 mm  - Navigational bx 04/02/23 : NSC favor adenoca   I don't clinically see a reason she would not be a candidate for resection esp since the nodule is in the LUL, the least important of the 4 in terms of contribution to ventilatory reserve, but will discuss with colleagues and in meantime proceed with PFTs.  Discussed in detail all the  indications, usual  risks and alternatives  relative to the benefits with patient for the different potential approaches going forward

## 2023-04-08 ENCOUNTER — Telehealth: Payer: Self-pay | Admitting: Internal Medicine

## 2023-04-08 ENCOUNTER — Ambulatory Visit: Payer: Medicare Other

## 2023-04-08 ENCOUNTER — Other Ambulatory Visit: Payer: Self-pay

## 2023-04-08 DIAGNOSIS — R911 Solitary pulmonary nodule: Secondary | ICD-10-CM

## 2023-04-08 DIAGNOSIS — C3492 Malignant neoplasm of unspecified part of left bronchus or lung: Secondary | ICD-10-CM

## 2023-04-08 LAB — PULMONARY FUNCTION TEST
DL/VA % pred: 99 %
DL/VA: 4.11 ml/min/mmHg/L
DLCO cor % pred: 95 %
DLCO cor: 20.5 ml/min/mmHg
DLCO unc % pred: 92 %
DLCO unc: 19.91 ml/min/mmHg
FEF 25-75 Post: 1.16 L/sec
FEF 25-75 Pre: 1.07 L/sec
FEF2575-%Change-Post: 9 %
FEF2575-%Pred-Post: 48 %
FEF2575-%Pred-Pre: 44 %
FEV1-%Change-Post: 6 %
FEV1-%Pred-Post: 71 %
FEV1-%Pred-Pre: 67 %
FEV1-Post: 1.94 L
FEV1-Pre: 1.83 L
FEV1FVC-%Change-Post: 6 %
FEV1FVC-%Pred-Pre: 80 %
FEV6-%Change-Post: 0 %
FEV6-%Pred-Post: 85 %
FEV6-%Pred-Pre: 85 %
FEV6-Post: 2.88 L
FEV6-Pre: 2.89 L
FEV6FVC-%Change-Post: 0 %
FEV6FVC-%Pred-Post: 102 %
FEV6FVC-%Pred-Pre: 101 %
FVC-%Change-Post: 0 %
FVC-%Pred-Post: 83 %
FVC-%Pred-Pre: 84 %
FVC-Post: 2.93 L
Post FEV1/FVC ratio: 66 %
Post FEV6/FVC ratio: 98 %
Pre FEV1/FVC ratio: 62 %
Pre FEV6/FVC Ratio: 98 %
RV % pred: 145 %
RV: 3.1 L
TLC % pred: 111 %
TLC: 5.95 L

## 2023-04-08 NOTE — Telephone Encounter (Signed)
Referral to [facility] placed.

## 2023-04-08 NOTE — Progress Notes (Signed)
Full PFT performed today. °

## 2023-04-08 NOTE — Telephone Encounter (Signed)
She is aware of result, need referral to T surgery in [place] Dr Dorris Fetch or [facility]

## 2023-04-08 NOTE — Patient Instructions (Signed)
Full PFT performed today. °

## 2023-04-08 NOTE — Telephone Encounter (Signed)
Patient has seen results on MyChart. Please advise, thank you!

## 2023-04-09 ENCOUNTER — Ambulatory Visit: Payer: Medicare Other | Admitting: Acute Care

## 2023-04-09 ENCOUNTER — Telehealth: Payer: Self-pay | Admitting: Internal Medicine

## 2023-04-09 ENCOUNTER — Other Ambulatory Visit: Payer: Self-pay | Admitting: *Deleted

## 2023-04-09 DIAGNOSIS — M0579 Rheumatoid arthritis with rheumatoid factor of multiple sites without organ or systems involvement: Secondary | ICD-10-CM

## 2023-04-09 DIAGNOSIS — R911 Solitary pulmonary nodule: Secondary | ICD-10-CM

## 2023-04-09 DIAGNOSIS — Z79899 Other long term (current) drug therapy: Secondary | ICD-10-CM

## 2023-04-09 MED ORDER — HUMIRA (2 PEN) 40 MG/0.4ML ~~LOC~~ AJKT
40.0000 mg | AUTO-INJECTOR | SUBCUTANEOUS | 0 refills | Status: DC
Start: 2023-04-09 — End: 2023-05-08

## 2023-04-09 NOTE — Telephone Encounter (Signed)
PET order placed.

## 2023-04-09 NOTE — Progress Notes (Signed)
Path reviewed a last office visit.   Thanks,  BLI  Josephine Igo, DO [name] 04/09/2023 4:58 PM

## 2023-04-09 NOTE — Telephone Encounter (Signed)
Refill request received via fax from [facility] for Humira   Last Fill: 01/07/2023  Labs: 04/02/2023 Glucose 112, Calcium 8.8, Total Protein 6.1, Albumin 3.2, AST 13, WBC 11.0  TB Gold: 01/21/2023 Neg    Next Visit: 06/11/2023  Last Visit: 01/07/2023  DX: Rheumatoid arthritis with rheumatoid factor of multiple sites without organ or systems involvement   Current Dose per office note 01/07/2023: Humira 40 mg subcutaneous injections every 14 days   Okay to refill Humira?

## 2023-04-09 NOTE — Telephone Encounter (Signed)
Needs PET ordered for lung nodule > 9 mm

## 2023-04-10 ENCOUNTER — Telehealth: Payer: Self-pay | Admitting: *Deleted

## 2023-04-10 NOTE — Telephone Encounter (Signed)
I called patient and confirmed that she should stop Humira 2 weeks prior to the surgery and Arava 1 week prior to the surgery and she will resume the medications only if approved by the oncologist.

## 2023-04-10 NOTE — Telephone Encounter (Signed)
Patient contacted the office and states she received the results from her CT Scan on her lungs. Patient states she has lung cancer. Patient states she has an appointment with the cardiothoracic surgeon on 04/16/2023. Patient states she has to have a PET scan on 04/18/2023. Patient states the pulmonologist thinks they will be able to get it all and she won't have to have radiation or chemotherapy. Patient states she is on [place] and Humira. Patient states she took her Humira today. Patient advised she would need to hold her Humira 2 weeks prior to surgery and her Arava 1 week prior to surgery. Patient advised to call back to let us know when her surgery is scheduled for.

## 2023-04-11 ENCOUNTER — Ambulatory Visit: Payer: Medicare Other | Admitting: Internal Medicine

## 2023-04-11 ENCOUNTER — Other Ambulatory Visit: Payer: Self-pay

## 2023-04-12 NOTE — Progress Notes (Signed)
The proposed treatment discussed in conference is for discussion purpose only and is not a binding recommendation.  The patients have not been physically examined, or presented with their treatment options.  Therefore, final treatment plans cannot be decided.  

## 2023-04-16 ENCOUNTER — Encounter: Payer: Self-pay | Admitting: Thoracic Surgery (Cardiothoracic Vascular Surgery)

## 2023-04-16 ENCOUNTER — Encounter: Payer: Self-pay | Admitting: *Deleted

## 2023-04-16 ENCOUNTER — Institutional Professional Consult (permissible substitution) (INDEPENDENT_AMBULATORY_CARE_PROVIDER_SITE_OTHER): Payer: Medicare Other | Admitting: Thoracic Surgery (Cardiothoracic Vascular Surgery)

## 2023-04-16 ENCOUNTER — Other Ambulatory Visit: Payer: Self-pay | Admitting: *Deleted

## 2023-04-16 VITALS — BP 104/72 | HR 85 | Resp 20 | Ht 66.0 in | Wt 185.0 lb

## 2023-04-16 DIAGNOSIS — C3492 Malignant neoplasm of unspecified part of left bronchus or lung: Secondary | ICD-10-CM

## 2023-04-16 DIAGNOSIS — R911 Solitary pulmonary nodule: Secondary | ICD-10-CM

## 2023-04-16 NOTE — Progress Notes (Signed)
PCP is Encarnacion Slates, PA-C Referring Provider is Nyoka Cowden, MD  Chief Complaint  Patient presents with   Lung Cancer    Bronch 04/02/23/ Chest CT 03/22/23/ PFT's 04/08/23/ PET Scan pending 04/18/23    HPI: [name] sent for consultation regarding an adenocarcinoma of the left upper lobe.  Christina Jenkins is a 62 year old woman with a history of tobacco abuse, COPD, hepatitis C, hyperlipidemia, type 2 diabetes, hypokalemia, depression, and rheumatoid arthritis.  She has smoked 2 packs a day for 40 years.  Currently smoking about a pack a day.  She had a low-dose CT for lung cancer screening in June.  It showed a left upper lobe nodule.  Super D CT in July showed a mixed density nodule in the left upper lobe measuring about 17 mm with a 9 mm solid component.  She saw Dr. Sherene Sires and was referred for navigational bronchoscopy.  Dr. Tonia Brooms performed robotic bronchoscopy on 04/02/2023.  Biopsy showed adenocarcinoma.  She is still smoking and is down to about a pack a day.  She denies chest pain, pressure, or tightness.  She gets short of breath with heavy exertion but can walk up a flight of stairs without stopping.  No cough or hemoptysis.  She has lost about 11 pounds over the past 3 months and about 20 pounds going back 6 months.  She attributes that to metformin.  Her appetite is good.  No unusual headaches or visual changes.  Disabled secondary to rheumatoid arthritis.  Zubrod Score: At the time of surgery this patient's most appropriate activity status/level should be described as: []     0    Normal activity, no symptoms [x]     1    Restricted in physical strenuous activity but ambulatory, able to do out light work []     2    Ambulatory and capable of self care, unable to do work activities, up and about >50 % of waking hours                              []     3    Only limited self care, in bed greater than 50% of waking hours []     4    Completely disabled, no self care, confined to bed or  chair []     5    Moribund   Past Medical History:  Diagnosis Date   Carpal tunnel syndrome    COPD (chronic obstructive pulmonary disease) (HCC)    Coronary artery calcification seen on CT scan    Normal coronaries at cardiac catheterization 2020   Depression    Essential hypertension    Hepatitis C    Hypokalemia    Mixed hyperlipidemia    Rheumatoid arthritis (HCC)    Rheumatoid arthritis (HCC)    Seasonal allergies    Type 2 diabetes mellitus (HCC)    Varicose veins of legs     Past Surgical History:  Procedure Laterality Date   BACK SURGERY     BRONCHIAL BIOPSY  04/02/2023   Procedure: BRONCHIAL BIOPSIES;  Surgeon: Josephine Igo, DO;  Location: [facility];  Service: Pulmonary;;   BRONCHIAL BRUSHINGS  04/02/2023   Procedure: BRONCHIAL BRUSHINGS;  Surgeon: Josephine Igo, DO;  Location: [facility];  Service: Pulmonary;;   BRONCHIAL NEEDLE ASPIRATION BIOPSY  04/02/2023   Procedure: BRONCHIAL NEEDLE ASPIRATION BIOPSIES;  Surgeon: Josephine Igo, DO;  Location: [facility];  Service:  Pulmonary;;   FIDUCIAL MARKER PLACEMENT  04/02/2023   Procedure: FIDUCIAL MARKER PLACEMENT;  Surgeon: Josephine Igo, DO;  Location: [facility];  Service: Pulmonary;;   LEFT HEART CATH AND CORONARY ANGIOGRAPHY N/A 04/30/2019   Procedure: LEFT HEART CATH AND CORONARY ANGIOGRAPHY;  Surgeon: Runell Gess, MD;  Location: [facility];  Service: Cardiovascular;  Laterality: N/A;   TONSILLECTOMY     TUBAL LIGATION      Family History  Problem Relation Age of Onset   Diabetes Mother    Pancreatic cancer Mother    Diabetes Father    COPD Father    Dementia Father    Diabetes Brother     Social History Social History   Tobacco Use   Smoking status: Every Day    Current packs/day: 2.00    Average packs/day: 2.0 packs/day for 42.5 years (85.0 ttl pk-yrs)    Types: Cigarettes    Start date: 10/18/1980    Passive exposure: Past   Smokeless tobacco: Never  Vaping Use    Vaping status: Never Used  Substance Use Topics   Alcohol use: No   Drug use: No    Current Outpatient Medications  Medication Sig Dispense Refill   adalimumab (HUMIRA, 2 PEN,) 40 MG/0.4ML pen Inject 0.4 mLs (40 mg total) into the skin every 14 (fourteen) days. 1 kit - 2 pens 6 each 0   cetirizine (ZYRTEC) 10 MG tablet Take 10 mg by mouth at bedtime.     DULoxetine (CYMBALTA) 60 MG capsule Take 120 mg by mouth at bedtime.     furosemide (LASIX) 20 MG tablet Take 40 mg by mouth in the morning.     ibuprofen (ADVIL) 200 MG tablet Take 400 mg by mouth every 8 (eight) hours as needed (pain.).     leflunomide (ARAVA) 20 MG tablet TAKE 1 TABLET(20 MG) BY MOUTH DAILY (Patient taking differently: Take 20 mg by mouth every evening.) 90 tablet 0   levETIRAcetam (KEPPRA XR) 500 MG 24 hr tablet Take 1 tablet (500 mg total) by mouth daily. (Patient taking differently: Take 500 mg by mouth at bedtime.) 30 tablet 0   lisinopril (ZESTRIL) 10 MG tablet Take 10 mg by mouth at bedtime.     metFORMIN (GLUCOPHAGE-XR) 500 MG 24 hr tablet Take 1,000 mg by mouth daily after supper.     Multiple Vitamin (MULTIVITAMIN) tablet Take 1 tablet by mouth at bedtime.     Potassium Chloride ER 20 MEQ TBCR Take 20 mEq by mouth at bedtime.     predniSONE (DELTASONE) 5 MG tablet TAKE 1 TABLET(5 MG) BY MOUTH DAILY WITH BREAKFAST 30 tablet 2   rosuvastatin (CRESTOR) 20 MG tablet Take 20 mg by mouth at bedtime.     Current Facility-Administered Medications  Medication Dose Route Frequency Provider Last Rate Last Admin   sodium chloride flush (NS) 0.9 % injection 3 mL  3 mL Intravenous Q12H Laqueta Linden, MD        No Known Allergies  Review of Systems  Constitutional:  Positive for unexpected weight change (Has lost 11 pounds in 3 months). Negative for appetite change and fatigue.  HENT:  Negative for trouble swallowing and voice change.   Eyes:  Negative for visual disturbance.  Respiratory:  Positive for shortness  of breath (Heavy exertion). Negative for cough and wheezing.   Cardiovascular:  Negative for chest pain, palpitations and leg swelling.  Gastrointestinal:  Negative for abdominal distention and abdominal pain.  Genitourinary:  Negative for difficulty urinating and dysuria.  Musculoskeletal:  Positive for arthralgias and joint swelling.  Neurological:  Positive for seizures. Negative for headaches.  Hematological:  Negative for adenopathy. Does not bruise/bleed easily.  Psychiatric/Behavioral:  Positive for dysphoric mood.   All other systems reviewed and are negative.   BP 104/72   Pulse 85   Resp 20   Ht 5\' 6"  (1.676 m)   Wt 185 lb (83.9 kg)   SpO2 95% Comment: RA  BMI 29.86 kg/m  Physical Exam Vitals reviewed.  Constitutional:      General: She is not in acute distress.    Appearance: Normal appearance.  HENT:     Head: Normocephalic and atraumatic.  Eyes:     General: No scleral icterus.    Extraocular Movements: Extraocular movements intact.  Cardiovascular:     Rate and Rhythm: Normal rate and regular rhythm.     Heart sounds: Normal heart sounds. No murmur heard.    No friction rub. No gallop.  Pulmonary:     Effort: Pulmonary effort is normal. No respiratory distress.     Breath sounds: Normal breath sounds. No wheezing or rales.  Abdominal:     General: There is no distension.     Palpations: Abdomen is soft.     Tenderness: There is no abdominal tenderness.  Musculoskeletal:        General: No deformity.  Skin:    General: Skin is warm and dry.  Neurological:     General: No focal deficit present.     Mental Status: She is alert and oriented to person, place, and time.     Cranial Nerves: No cranial nerve deficit.    Diagnostic Tests: CT CHEST WITHOUT CONTRAST   TECHNIQUE: Multidetector CT imaging of the chest was performed using thin slice collimation for electromagnetic bronchoscopy planning purposes, without intravenous contrast.   RADIATION DOSE  REDUCTION: This exam was performed according to the departmental dose-optimization program which includes automated exposure control, adjustment of the mA and/or kV according to patient size and/or use of iterative reconstruction technique.   COMPARISON:  Lung cancer screening CT dated February 11, 2023   FINDINGS: Cardiovascular: Normal heart size. No pericardial effusion. Normal caliber thoracic aorta with mild calcified plaque. Moderate coronary artery calcifications.   Mediastinum/Nodes: Esophagus and thyroid are unremarkable. No enlarged lymph nodes seen in the chest.   Lungs/Pleura: Central airways are patent. Mild paraseptal emphysema. Numerous calcified nodules seen bilaterally. Part solid nodule of the left upper lobe is unchanged in size when compared with the prior exam with overall mean diameter of 16.9 mm and solid component measuring 9.1 mm.   Upper Abdomen: No acute abnormality.   Musculoskeletal: No chest wall mass or suspicious bone lesions identified.   IMPRESSION: 1. Part solid nodule of the left upper lobe is unchanged in size when compared with the prior exam and concerning for primary lung adenocarcinoma. 2. Coronary artery calcifications, aortic Atherosclerosis (ICD10-I70.0) and Emphysema (ICD10-J43.9).     Electronically Signed   By: Allegra Lai M.D.   On: 04/01/2023 08:17   I personally reviewed the CT images.  There is a 17 mm mixed density nodule in the anterior left upper lobe.  Coronary and aortic atherosclerosis.  Pulmonary function testing 04/08/2023 FVC 84% predicted FEV1 1.83 (67%) FEV1 1.94 (71%) postbronchodilator DLCO corrected 20.50 (95%)  Impression: Christina Jenkins is a 62 year old woman with a history of tobacco abuse, COPD, hepatitis C, hyperlipidemia, type 2 diabetes, hypokalemia, depression, and  rheumatoid arthritis.  Found to have a left upper lobe lung nodule on low-dose CT.  Biopsy showed adenocarcinoma.  Adenocarcinoma left  upper lobe -clinical stage Ia based on CT.  PET/CT pending.  I would be surprised if that demonstrated any metastatic disease, but we do need one to complete clinical staging prior to surgical resection.  We discussed treatment options including surgical resection and stereotactic radiation.  We discussed the relative advantages and disadvantages of each approach.she understands that surgery is the gold standard but there are risks associated with surgical resection.  She does have adequate pulmonary reserve to tolerate a resection.  The plan would be to do a robotic assisted left upper lobectomy or segmentectomy (lingular sparing lobectomy) based on intraoperative findings.  I informed Mrs. Earna Coder and her husband of the general nature of the procedure including the need for general anesthesia, the incisions to be used, the use of the surgical robot, the use of a drainage tube postoperatively, the expected hospital stay, and the overall recovery.  Informed them of the indications, risks, benefits, and alternatives.  They understand the risks include, but are not limited to death, MI, DVT, PE, bleeding, possible need for transfusion, infection, prolonged air leak, cardiac arrhythmias, as well as possibility of other unforeseeable complications.  She is willing to accept the risks and wishes to proceed with surgical resection.  Based on staging today she would not need any chemotherapy.  Final determination will depend on both PET/CT and intraoperative findings.    Tobacco abuse - 84-pack-year history of smoking, currently smoking a pack a day.  Strongly emphasized the need to quit smoking.  Rheumatoid arthritis-on Humira, Arava, and prednisone.  Received Humira a week ago.  She will not get that again prior to surgery.  She will stop Arava for 1 week prior to surgery.  Continue prednisone.  May need stress dose steroids around the time of surgery.  Plan: Stop Humira now.  Stop Arava 1 week prior to  surgery. Quit smoking Review PET once done Tentatively plan for robotic assisted left upper lobectomy or segmentectomy on Friday, 05/03/2023  I spent over 45 minutes in review of records, images, and in consultation with Mrs. Tuttle today. [name], MD [facility] [phone]

## 2023-04-16 NOTE — H&P (View-Only) (Signed)
 PCP is Encarnacion Slates, PA-C Referring Provider is Nyoka Cowden, MD  Chief Complaint  Patient presents with   Lung Cancer    Bronch 04/02/23/ Chest CT 03/22/23/ PFT's 04/08/23/ PET Scan pending 04/18/23    HPI: Christina Jenkins sent for consultation regarding an adenocarcinoma of the left upper lobe.  Christina Jenkins is a 62 year old woman with a history of tobacco abuse, COPD, hepatitis C, hyperlipidemia, type 2 diabetes, hypokalemia, depression, and rheumatoid arthritis.  She has smoked 2 packs a day for 40 years.  Currently smoking about a pack a day.  She had a low-dose CT for lung cancer screening in June.  It showed a left upper lobe nodule.  Super D CT in July showed a mixed density nodule in the left upper lobe measuring about 17 mm with a 9 mm solid component.  She saw Dr. Sherene Sires and was referred for navigational bronchoscopy.  Dr. Tonia Brooms performed robotic bronchoscopy on 04/02/2023.  Biopsy showed adenocarcinoma.  She is still smoking and is down to about a pack a day.  She denies chest pain, pressure, or tightness.  She gets short of breath with heavy exertion but can walk up a flight of stairs without stopping.  No cough or hemoptysis.  She has lost about 11 pounds over the past 3 months and about 20 pounds going back 6 months.  She attributes that to metformin.  Her appetite is good.  No unusual headaches or visual changes.  Disabled secondary to rheumatoid arthritis.  Zubrod Score: At the time of surgery this patient's most appropriate activity status/level should be described as: []     0    Normal activity, no symptoms [x]     1    Restricted in physical strenuous activity but ambulatory, able to do out light work []     2    Ambulatory and capable of self care, unable to do work activities, up and about >50 % of waking hours                              []     3    Only limited self care, in bed greater than 50% of waking hours []     4    Completely disabled, no self care, confined to bed or  chair []     5    Moribund   Past Medical History:  Diagnosis Date   Carpal tunnel syndrome    COPD (chronic obstructive pulmonary disease) (HCC)    Coronary artery calcification seen on CT scan    Normal coronaries at cardiac catheterization 2020   Depression    Essential hypertension    Hepatitis C    Hypokalemia    Mixed hyperlipidemia    Rheumatoid arthritis (HCC)    Rheumatoid arthritis (HCC)    Seasonal allergies    Type 2 diabetes mellitus (HCC)    Varicose veins of legs     Past Surgical History:  Procedure Laterality Date   BACK SURGERY     BRONCHIAL BIOPSY  04/02/2023   Procedure: BRONCHIAL BIOPSIES;  Surgeon: Josephine Igo, DO;  Location: [facility];  Service: Pulmonary;;   BRONCHIAL BRUSHINGS  04/02/2023   Procedure: BRONCHIAL BRUSHINGS;  Surgeon: Josephine Igo, DO;  Location: [facility];  Service: Pulmonary;;   BRONCHIAL NEEDLE ASPIRATION BIOPSY  04/02/2023   Procedure: BRONCHIAL NEEDLE ASPIRATION BIOPSIES;  Surgeon: Josephine Igo, DO;  Location: [facility];  Service:  Pulmonary;;   FIDUCIAL MARKER PLACEMENT  04/02/2023   Procedure: FIDUCIAL MARKER PLACEMENT;  Surgeon: Josephine Igo, DO;  Location: [facility];  Service: Pulmonary;;   LEFT HEART CATH AND CORONARY ANGIOGRAPHY N/A 04/30/2019   Procedure: LEFT HEART CATH AND CORONARY ANGIOGRAPHY;  Surgeon: Runell Gess, MD;  Location: [facility];  Service: Cardiovascular;  Laterality: N/A;   TONSILLECTOMY     TUBAL LIGATION      Family History  Problem Relation Age of Onset   Diabetes Mother    Pancreatic cancer Mother    Diabetes Father    COPD Father    Dementia Father    Diabetes Brother     Social History Social History   Tobacco Use   Smoking status: Every Day    Current packs/day: 2.00    Average packs/day: 2.0 packs/day for 42.5 years (85.0 ttl pk-yrs)    Types: Cigarettes    Start date: 10/18/1980    Passive exposure: Past   Smokeless tobacco: Never  Vaping Use    Vaping status: Never Used  Substance Use Topics   Alcohol use: No   Drug use: No    Current Outpatient Medications  Medication Sig Dispense Refill   adalimumab (HUMIRA, 2 PEN,) 40 MG/0.4ML pen Inject 0.4 mLs (40 mg total) into the skin every 14 (fourteen) days. 1 kit - 2 pens 6 each 0   cetirizine (ZYRTEC) 10 MG tablet Take 10 mg by mouth at bedtime.     DULoxetine (CYMBALTA) 60 MG capsule Take 120 mg by mouth at bedtime.     furosemide (LASIX) 20 MG tablet Take 40 mg by mouth in the morning.     ibuprofen (ADVIL) 200 MG tablet Take 400 mg by mouth every 8 (eight) hours as needed (pain.).     leflunomide (ARAVA) 20 MG tablet TAKE 1 TABLET(20 MG) BY MOUTH DAILY (Patient taking differently: Take 20 mg by mouth every evening.) 90 tablet 0   levETIRAcetam (KEPPRA XR) 500 MG 24 hr tablet Take 1 tablet (500 mg total) by mouth daily. (Patient taking differently: Take 500 mg by mouth at bedtime.) 30 tablet 0   lisinopril (ZESTRIL) 10 MG tablet Take 10 mg by mouth at bedtime.     metFORMIN (GLUCOPHAGE-XR) 500 MG 24 hr tablet Take 1,000 mg by mouth daily after supper.     Multiple Vitamin (MULTIVITAMIN) tablet Take 1 tablet by mouth at bedtime.     Potassium Chloride ER 20 MEQ TBCR Take 20 mEq by mouth at bedtime.     predniSONE (DELTASONE) 5 MG tablet TAKE 1 TABLET(5 MG) BY MOUTH DAILY WITH BREAKFAST 30 tablet 2   rosuvastatin (CRESTOR) 20 MG tablet Take 20 mg by mouth at bedtime.     Current Facility-Administered Medications  Medication Dose Route Frequency Provider Last Rate Last Admin   sodium chloride flush (NS) 0.9 % injection 3 mL  3 mL Intravenous Q12H Laqueta Linden, MD        No Known Allergies  Review of Systems  Constitutional:  Positive for unexpected weight change (Has lost 11 pounds in 3 months). Negative for appetite change and fatigue.  HENT:  Negative for trouble swallowing and voice change.   Eyes:  Negative for visual disturbance.  Respiratory:  Positive for shortness  of breath (Heavy exertion). Negative for cough and wheezing.   Cardiovascular:  Negative for chest pain, palpitations and leg swelling.  Gastrointestinal:  Negative for abdominal distention and abdominal pain.  Genitourinary:  Negative for difficulty urinating and dysuria.  Musculoskeletal:  Positive for arthralgias and joint swelling.  Neurological:  Positive for seizures. Negative for headaches.  Hematological:  Negative for adenopathy. Does not bruise/bleed easily.  Psychiatric/Behavioral:  Positive for dysphoric mood.   All other systems reviewed and are negative.   BP 104/72   Pulse 85   Resp 20   Ht 5\' 6"  (1.676 m)   Wt 185 lb (83.9 kg)   SpO2 95% Comment: RA  BMI 29.86 kg/m  Physical Exam Vitals reviewed.  Constitutional:      General: She is not in acute distress.    Appearance: Normal appearance.  HENT:     Head: Normocephalic and atraumatic.  Eyes:     General: No scleral icterus.    Extraocular Movements: Extraocular movements intact.  Cardiovascular:     Rate and Rhythm: Normal rate and regular rhythm.     Heart sounds: Normal heart sounds. No murmur heard.    No friction rub. No gallop.  Pulmonary:     Effort: Pulmonary effort is normal. No respiratory distress.     Breath sounds: Normal breath sounds. No wheezing or rales.  Abdominal:     General: There is no distension.     Palpations: Abdomen is soft.     Tenderness: There is no abdominal tenderness.  Musculoskeletal:        General: No deformity.  Skin:    General: Skin is warm and dry.  Neurological:     General: No focal deficit present.     Mental Status: She is alert and oriented to person, place, and time.     Cranial Nerves: No cranial nerve deficit.    Diagnostic Tests: CT CHEST WITHOUT CONTRAST   TECHNIQUE: Multidetector CT imaging of the chest was performed using thin slice collimation for electromagnetic bronchoscopy planning purposes, without intravenous contrast.   RADIATION DOSE  REDUCTION: This exam was performed according to the departmental dose-optimization program which includes automated exposure control, adjustment of the mA and/or kV according to patient size and/or use of iterative reconstruction technique.   COMPARISON:  Lung cancer screening CT dated February 11, 2023   FINDINGS: Cardiovascular: Normal heart size. No pericardial effusion. Normal caliber thoracic aorta with mild calcified plaque. Moderate coronary artery calcifications.   Mediastinum/Nodes: Esophagus and thyroid are unremarkable. No enlarged lymph nodes seen in the chest.   Lungs/Pleura: Central airways are patent. Mild paraseptal emphysema. Numerous calcified nodules seen bilaterally. Part solid nodule of the left upper lobe is unchanged in size when compared with the prior exam with overall mean diameter of 16.9 mm and solid component measuring 9.1 mm.   Upper Abdomen: No acute abnormality.   Musculoskeletal: No chest wall mass or suspicious bone lesions identified.   IMPRESSION: 1. Part solid nodule of the left upper lobe is unchanged in size when compared with the prior exam and concerning for primary lung adenocarcinoma. 2. Coronary artery calcifications, aortic Atherosclerosis (ICD10-I70.0) and Emphysema (ICD10-J43.9).     Electronically Signed   By: Allegra Lai M.D.   On: 04/01/2023 08:17   I personally reviewed the CT images.  There is a 17 mm mixed density nodule in the anterior left upper lobe.  Coronary and aortic atherosclerosis.  Pulmonary function testing 04/08/2023 FVC 84% predicted FEV1 1.83 (67%) FEV1 1.94 (71%) postbronchodilator DLCO corrected 20.50 (95%)  Impression: Christina Jenkins is a 62 year old woman with a history of tobacco abuse, COPD, hepatitis C, hyperlipidemia, type 2 diabetes, hypokalemia, depression, and  rheumatoid arthritis.  Found to have a left upper lobe lung nodule on low-dose CT.  Biopsy showed adenocarcinoma.  Adenocarcinoma left  upper lobe -clinical stage Ia based on CT.  PET/CT pending.  I would be surprised if that demonstrated any metastatic disease, but we do need one to complete clinical staging prior to surgical resection.  We discussed treatment options including surgical resection and stereotactic radiation.  We discussed the relative advantages and disadvantages of each approach.she understands that surgery is the gold standard but there are risks associated with surgical resection.  She does have adequate pulmonary reserve to tolerate a resection.  The plan would be to do a robotic assisted left upper lobectomy or segmentectomy (lingular sparing lobectomy) based on intraoperative findings.  I informed Christina Jenkins and her husband of the general nature of the procedure including the need for general anesthesia, the incisions to be used, the use of the surgical robot, the use of a drainage tube postoperatively, the expected hospital stay, and the overall recovery.  Informed them of the indications, risks, benefits, and alternatives.  They understand the risks include, but are not limited to death, MI, DVT, PE, bleeding, possible need for transfusion, infection, prolonged air leak, cardiac arrhythmias, as well as possibility of other unforeseeable complications.  She is willing to accept the risks and wishes to proceed with surgical resection.  Based on staging today she would not need any chemotherapy.  Final determination will depend on both PET/CT and intraoperative findings.    Tobacco abuse - 84-pack-year history of smoking, currently smoking a pack a day.  Strongly emphasized the need to quit smoking.  Rheumatoid arthritis-on Humira, Arava, and prednisone.  Received Humira a week ago.  She will not get that again prior to surgery.  She will stop Arava for 1 week prior to surgery.  Continue prednisone.  May need stress dose steroids around the time of surgery.  Plan: Stop Humira now.  Stop Arava 1 week prior to  surgery. Quit smoking Review PET once done Tentatively plan for robotic assisted left upper lobectomy or segmentectomy on Friday, 05/03/2023  I spent over 45 minutes in review of records, images, and in consultation with Christina Jenkins today. [name], MD [facility] [phone]

## 2023-04-18 ENCOUNTER — Encounter (HOSPITAL_COMMUNITY)
Admission: RE | Admit: 2023-04-18 | Discharge: 2023-04-18 | Disposition: A | Discharge status: Home / Self Care | Payer: Medicare Other | Source: Ambulatory Visit | Attending: Internal Medicine | Admitting: Internal Medicine

## 2023-04-18 DIAGNOSIS — C3490 Malignant neoplasm of unspecified part of unspecified bronchus or lung: Secondary | ICD-10-CM | POA: Diagnosis not present

## 2023-04-18 DIAGNOSIS — R911 Solitary pulmonary nodule: Secondary | ICD-10-CM | POA: Insufficient documentation

## 2023-04-18 MED ORDER — FLUDEOXYGLUCOSE F - 18 (FDG) INJECTION
8.7400 | Freq: Once | INTRAVENOUS | Status: AC | PRN
  Administered 2023-04-18: 8.74 via INTRAVENOUS

## 2023-04-25 ENCOUNTER — Telehealth: Payer: Self-pay | Admitting: Internal Medicine

## 2023-04-25 NOTE — Telephone Encounter (Signed)
Pt calling to get her results from PET Scan

## 2023-04-30 NOTE — Pre-Procedure Instructions (Signed)
Surgical Instructions   Your procedure is scheduled on Friday, August 30th. Report to [facility] Main Entrance "A" at 05:30 A.M., then check in with the Admitting office. Any questions or running late day of surgery: call [phone]  Questions prior to your surgery date: call [phone], Monday-Friday, 8am-4pm. If you experience any cold or flu symptoms such as cough, fever, chills, shortness of breath, etc. between now and your scheduled surgery, please notify us at the above number.     Remember:  Do not eat or drink after midnight the night before your surgery     Take these medicines the morning of surgery with A SIP OF WATER  predniSONE (DELTASONE)    Per Rheumatologist orders:  stop Humira 2 weeks prior to the surgery and Arava 1 week prior to the surgery and will resume the medications only if approved by the oncologist.  One week prior to surgery, STOP taking any Aspirin (unless otherwise instructed by your surgeon) Aleve, Naproxen, Ibuprofen, Motrin, Advil, Goody's, BC's, all herbal medications, fish oil, and non-prescription vitamins.  WHAT DO I DO ABOUT MY DIABETES MEDICATION?   Do not take metFORMIN (GLUCOPHAGE-XR) the morning of surgery.    HOW TO MANAGE YOUR DIABETES BEFORE AND AFTER SURGERY  Why is it important to control my blood sugar before and after surgery? Improving blood sugar levels before and after surgery helps healing and can limit problems. A way of improving blood sugar control is eating a healthy diet by:  Eating less sugar and carbohydrates  Increasing activity/exercise  Talking with your doctor about reaching your blood sugar goals High blood sugars (greater than 180 mg/dL) can raise your risk of infections and slow your recovery, so you will need to focus on controlling your diabetes during the weeks before surgery. Make sure that the doctor who takes care of your diabetes knows about your planned surgery including the date and  location.  How do I manage my blood sugar before surgery? Check your blood sugar at least 4 times a day, starting 2 days before surgery, to make sure that the level is not too high or low.  Check your blood sugar the morning of your surgery when you wake up and every 2 hours until you get to the Short Stay unit.  If your blood sugar is less than 70 mg/dL, you will need to treat for low blood sugar: Do not take insulin. Treat a low blood sugar (less than 70 mg/dL) with  cup of clear juice (cranberry or apple), 4 glucose tablets, OR glucose gel. Recheck blood sugar in 15 minutes after treatment (to make sure it is greater than 70 mg/dL). If your blood sugar is not greater than 70 mg/dL on recheck, call [phone] for further instructions. Report your blood sugar to the short stay nurse when you get to Short Stay.  If you are admitted to the hospital after surgery: Your blood sugar will be checked by the staff and you will probably be given insulin after surgery (instead of oral diabetes medicines) to make sure you have good blood sugar levels. The goal for blood sugar control after surgery is 80-180 mg/dL.                     Do NOT Smoke (Tobacco/Vaping) for 24 hours prior to your procedure.  If you use a CPAP at night, you may bring your mask/headgear for your overnight stay.   You will be asked to remove any contacts, glasses,  piercing's, hearing aid's, dentures/partials prior to surgery. Please bring cases for these items if needed.    Patients discharged the day of surgery will not be allowed to drive home, and someone needs to stay with them for 24 hours.  SURGICAL WAITING ROOM VISITATION Patients may have no more than 2 support people in the waiting area - these visitors may rotate.   Pre-op nurse will coordinate an appropriate time for 1 ADULT support person, who may not rotate, to accompany patient in pre-op.  Children under the age of 55 must have an adult with them who is not  the patient and must remain in the main waiting area with an adult.  If the patient needs to stay at the hospital during part of their recovery, the visitor guidelines for inpatient rooms apply.  Please refer to the [facility] website for the visitor guidelines for any additional information.   If you received a COVID test during your pre-op visit  it is requested that you wear a mask when out in public, stay away from anyone that may not be feeling well and notify your surgeon if you develop symptoms. If you have been in contact with anyone that has tested positive in the last 10 days please notify you surgeon.      Pre-operative CHG Bathing Instructions   You can play a key role in reducing the risk of infection after surgery. Your skin needs to be as free of germs as possible. You can reduce the number of germs on your skin by washing with CHG (chlorhexidine gluconate) soap before surgery. CHG is an antiseptic soap that kills germs and continues to kill germs even after washing.   DO NOT use if you have an allergy to chlorhexidine/CHG or antibacterial soaps. If your skin becomes reddened or irritated, stop using the CHG and notify one of our RNs at [phone].              TAKE A SHOWER THE NIGHT BEFORE SURGERY AND THE DAY OF SURGERY    Please keep in mind the following:  DO NOT shave, including legs and underarms, 48 hours prior to surgery.   You may shave your face before/day of surgery.  Place clean sheets on your bed the night before surgery Use a clean washcloth (not used since being washed) for each shower. DO NOT sleep with pet's night before surgery.  CHG Shower Instructions:  If you choose to wash your hair and private area, wash first with your normal shampoo/soap.  After you use shampoo/soap, rinse your hair and body thoroughly to remove shampoo/soap residue.  Turn the water OFF and apply half the bottle of CHG soap to a CLEAN washcloth.  Apply CHG soap ONLY FROM YOUR  NECK DOWN TO YOUR TOES (washing for 3-5 minutes)  DO NOT use CHG soap on face, private areas, open wounds, or sores.  Pay special attention to the area where your surgery is being performed.  If you are having back surgery, having someone wash your back for you may be helpful. Wait 2 minutes after CHG soap is applied, then you may rinse off the CHG soap.  Pat dry with a clean towel  Put on clean pajamas    Additional instructions for the day of surgery: DO NOT APPLY any lotions, deodorants, cologne, or perfumes.   Do not wear jewelry or makeup Do not wear nail polish, gel polish, artificial nails, or any other type of covering on natural nails (fingers  and toes) Do not bring valuables to the hospital. [facility] is not responsible for valuables/personal belongings. Put on clean/comfortable clothes.  Please brush your teeth.  Ask your nurse before applying any prescription medications to the skin.

## 2023-05-01 ENCOUNTER — Other Ambulatory Visit: Payer: Self-pay

## 2023-05-01 ENCOUNTER — Ambulatory Visit (HOSPITAL_COMMUNITY)
Admission: RE | Admit: 2023-05-01 | Discharge: 2023-05-01 | Disposition: A | Discharge status: Home / Self Care | Payer: Medicare Other | Source: Ambulatory Visit | Attending: Thoracic Surgery (Cardiothoracic Vascular Surgery) | Admitting: Thoracic Surgery (Cardiothoracic Vascular Surgery)

## 2023-05-01 ENCOUNTER — Encounter (HOSPITAL_COMMUNITY)
Admission: RE | Admit: 2023-05-01 | Discharge: 2023-05-01 | Disposition: A | Discharge status: Home / Self Care | Payer: Medicare Other | Source: Ambulatory Visit | Attending: Thoracic Surgery (Cardiothoracic Vascular Surgery) | Admitting: Thoracic Surgery (Cardiothoracic Vascular Surgery)

## 2023-05-01 ENCOUNTER — Encounter (HOSPITAL_COMMUNITY): Payer: Self-pay

## 2023-05-01 ENCOUNTER — Other Ambulatory Visit: Payer: Self-pay | Admitting: *Deleted

## 2023-05-01 VITALS — BP 117/58 | HR 94 | Temp 98.2°F | Resp 17 | Ht 66.0 in | Wt 186.0 lb

## 2023-05-01 DIAGNOSIS — I2510 Atherosclerotic heart disease of native coronary artery without angina pectoris: Secondary | ICD-10-CM | POA: Diagnosis not present

## 2023-05-01 DIAGNOSIS — Z01811 Encounter for preprocedural respiratory examination: Secondary | ICD-10-CM | POA: Insufficient documentation

## 2023-05-01 DIAGNOSIS — E782 Mixed hyperlipidemia: Secondary | ICD-10-CM | POA: Diagnosis not present

## 2023-05-01 DIAGNOSIS — E119 Type 2 diabetes mellitus without complications: Secondary | ICD-10-CM

## 2023-05-01 DIAGNOSIS — J4489 Other specified chronic obstructive pulmonary disease: Secondary | ICD-10-CM | POA: Diagnosis not present

## 2023-05-01 DIAGNOSIS — Z825 Family history of asthma and other chronic lower respiratory diseases: Secondary | ICD-10-CM | POA: Diagnosis not present

## 2023-05-01 DIAGNOSIS — C3412 Malignant neoplasm of upper lobe, left bronchus or lung: Secondary | ICD-10-CM | POA: Diagnosis not present

## 2023-05-01 DIAGNOSIS — Z01812 Encounter for preprocedural laboratory examination: Secondary | ICD-10-CM | POA: Insufficient documentation

## 2023-05-01 DIAGNOSIS — F17200 Nicotine dependence, unspecified, uncomplicated: Secondary | ICD-10-CM | POA: Insufficient documentation

## 2023-05-01 DIAGNOSIS — J939 Pneumothorax, unspecified: Secondary | ICD-10-CM | POA: Diagnosis not present

## 2023-05-01 DIAGNOSIS — C3492 Malignant neoplasm of unspecified part of left bronchus or lung: Secondary | ICD-10-CM

## 2023-05-01 DIAGNOSIS — Z9889 Other specified postprocedural states: Secondary | ICD-10-CM | POA: Insufficient documentation

## 2023-05-01 DIAGNOSIS — J9589 Other postprocedural complications and disorders of respiratory system, not elsewhere classified: Secondary | ICD-10-CM | POA: Diagnosis not present

## 2023-05-01 DIAGNOSIS — M069 Rheumatoid arthritis, unspecified: Secondary | ICD-10-CM | POA: Diagnosis not present

## 2023-05-01 DIAGNOSIS — Z7984 Long term (current) use of oral hypoglycemic drugs: Secondary | ICD-10-CM | POA: Diagnosis not present

## 2023-05-01 DIAGNOSIS — Z01818 Encounter for other preprocedural examination: Secondary | ICD-10-CM

## 2023-05-01 DIAGNOSIS — J439 Emphysema, unspecified: Secondary | ICD-10-CM | POA: Diagnosis not present

## 2023-05-01 DIAGNOSIS — Z800 Family history of malignant neoplasm of digestive organs: Secondary | ICD-10-CM | POA: Diagnosis not present

## 2023-05-01 DIAGNOSIS — Z833 Family history of diabetes mellitus: Secondary | ICD-10-CM | POA: Diagnosis not present

## 2023-05-01 DIAGNOSIS — Z7952 Long term (current) use of systemic steroids: Secondary | ICD-10-CM | POA: Diagnosis not present

## 2023-05-01 DIAGNOSIS — I10 Essential (primary) hypertension: Secondary | ICD-10-CM | POA: Diagnosis not present

## 2023-05-01 DIAGNOSIS — F17210 Nicotine dependence, cigarettes, uncomplicated: Secondary | ICD-10-CM | POA: Diagnosis not present

## 2023-05-01 DIAGNOSIS — Z1152 Encounter for screening for COVID-19: Secondary | ICD-10-CM | POA: Insufficient documentation

## 2023-05-01 DIAGNOSIS — Z01810 Encounter for preprocedural cardiovascular examination: Secondary | ICD-10-CM | POA: Insufficient documentation

## 2023-05-01 DIAGNOSIS — I4519 Other right bundle-branch block: Secondary | ICD-10-CM | POA: Insufficient documentation

## 2023-05-01 DIAGNOSIS — J9382 Other air leak: Secondary | ICD-10-CM | POA: Diagnosis not present

## 2023-05-01 DIAGNOSIS — Z79899 Other long term (current) drug therapy: Secondary | ICD-10-CM | POA: Insufficient documentation

## 2023-05-01 DIAGNOSIS — J9811 Atelectasis: Secondary | ICD-10-CM | POA: Diagnosis not present

## 2023-05-01 HISTORY — DX: Malignant neoplasm of upper lobe, left bronchus or lung: C34.12

## 2023-05-01 LAB — COMPREHENSIVE METABOLIC PANEL
ALT: 19 U/L (ref 0–44)
AST: 21 U/L (ref 15–41)
Albumin: 3.8 g/dL (ref 3.5–5.0)
Alkaline Phosphatase: 69 U/L (ref 38–126)
Anion gap: 14 (ref 5–15)
BUN: 11 mg/dL (ref 8–23)
CO2: 21 mmol/L — ABNORMAL LOW (ref 22–32)
Calcium: 8.8 mg/dL — ABNORMAL LOW (ref 8.9–10.3)
Chloride: 102 mmol/L (ref 98–111)
Creatinine, Ser: 0.78 mg/dL (ref 0.44–1.00)
GFR, Estimated: >60 mL/min (ref >60)
Glucose, Bld: 86 mg/dL (ref 70–99)
Potassium: 3.7 mmol/L (ref 3.5–5.1)
Sodium: 137 mmol/L (ref 135–145)
Total Bilirubin: 0.2 mg/dL — ABNORMAL LOW (ref 0.3–1.2)
Total Protein: 6.8 g/dL (ref 6.5–8.1)

## 2023-05-01 LAB — URINALYSIS, ROUTINE W REFLEX MICROSCOPIC
Bilirubin Urine: NEGATIVE
Glucose, UA: NEGATIVE mg/dL
Hgb urine dipstick: NEGATIVE
Ketones, ur: NEGATIVE mg/dL
Nitrite: NEGATIVE
Protein, ur: NEGATIVE mg/dL
Specific Gravity, Urine: 1.012 (ref 1.005–1.030)
pH: 5 (ref 5.0–8.0)

## 2023-05-01 LAB — CBC
HCT: 41.9 % (ref 36.0–46.0)
Hemoglobin: 13.8 g/dL (ref 12.0–15.0)
MCH: 30.2 pg (ref 26.0–34.0)
MCHC: 32.9 g/dL (ref 30.0–36.0)
MCV: 91.7 fL (ref 80.0–100.0)
Platelets: 211 10*3/uL (ref 150–400)
RBC: 4.57 MIL/uL (ref 3.87–5.11)
RDW: 11.9 % (ref 11.5–15.5)
WBC: 7.9 10*3/uL (ref 4.0–10.5)
nRBC: 0 % (ref 0.0–0.2)

## 2023-05-01 LAB — TYPE AND SCREEN
ABO/RH(D): B POS
Antibody Screen: NEGATIVE

## 2023-05-01 LAB — HEMOGLOBIN A1C
Hgb A1c MFr Bld: 5.5 % (ref 4.8–5.6)
Mean Plasma Glucose: 111.15 mg/dL

## 2023-05-01 LAB — SURGICAL PCR SCREEN
MRSA, PCR: NEGATIVE
Staphylococcus aureus: NEGATIVE

## 2023-05-01 LAB — APTT: aPTT: 39 seconds — ABNORMAL HIGH (ref 24–36)

## 2023-05-01 LAB — PROTIME-INR
INR: 0.9 (ref 0.8–1.2)
Prothrombin Time: 12.4 seconds (ref 11.4–15.2)

## 2023-05-01 LAB — SARS CORONAVIRUS 2 (TAT 6-24 HRS): SARS Coronavirus 2: NEGATIVE

## 2023-05-01 LAB — GLUCOSE, CAPILLARY: Glucose-Capillary: 110 mg/dL — ABNORMAL HIGH (ref 70–99)

## 2023-05-01 MED ORDER — CIPROFLOXACIN HCL 500 MG PO TABS
500.0000 mg | ORAL_TABLET | Freq: Two times a day (BID) | ORAL | 0 refills | Status: DC
Start: 2023-05-01 — End: 2023-05-08

## 2023-05-01 NOTE — Progress Notes (Signed)
Unable to obtain ABG at PAT appt. Pt will need ABG on day of surgery

## 2023-05-01 NOTE — Progress Notes (Signed)
PCP - Roxine Caddy, PA-C Cardiologist - Dr. Nona Dell (last saw on 04/09/22) Pulmonologist- Nyoka Cowden, MD  Rheumatologist- Pollyann Savoy, MD     PPM/ICD - denies   Chest x-ray - 05/01/23 EKG - 05/01/23 Stress Test - 04/16/22 ECHO - 04/10/19 Cardiac Cath - 04/30/19  Sleep Study - denies   Fasting Blood Sugar - 100-120 Pt does not check CBG at home  Last dose of GLP1 agonist-  n/a   ASA/Blood Thinner Instructions: n/a   ERAS Protcol - no, NPO   COVID TEST- 05/01/23   Anesthesia review: yes, cardiac hx, pulmonologist, rheumatologist  Patient denies shortness of breath, fever, cough and chest pain at PAT appointment   All instructions explained to the patient, with a verbal understanding of the material. Patient agrees to go over the instructions while at home for a better understanding. Patient also instructed to wear a mask in public after being tested for COVID-19. The opportunity to ask questions was provided.

## 2023-05-01 NOTE — Progress Notes (Signed)
Per Dr. Dorris Fetch, Cipro 500mg  BID sent to patient's preferred pharmacy due to UA results. Patient aware.

## 2023-05-02 NOTE — Telephone Encounter (Signed)
Good news, problem is limited to the spont we know about , no evidence of spread, so I presumed Dr Dorris Fetch would be setting up for surgery - call if that's not being done

## 2023-05-02 NOTE — Progress Notes (Signed)
Anesthesia Chart Review:  History of rheumatoid arthritis, followed by rheumatology, currently maintained on Humira, Areva, and prednisone.  Per Dr. Sunday Corn note 04/16/2023, "Rheumatoid arthritis-on Humira, Arava, and prednisone.  Received Humira a week ago.  She will not get that again prior to surgery.  She will stop Arava for 1 week prior to surgery.  Continue prednisone.  May need stress dose steroids around the time of surgery."    History of seizures, well-controlled on Keppra.   Follows with cardiology for history of HLD, HTN, coronary calcifications by CT imaging.  Catheterization in 2020 showed normal coronaries.  Last seen by Dr. Diona Browner 04/09/2022, recommended continue current medical therapy, annual follow-up.   Non-insulin-dependent DM2, on metformin only.   She is a current smoker was recently found to have an abnormal low-dose screening CT which was followed up with a high-resolution scan showing left upper lobe nodule concerning for primary lung adenocarcinoma. Dr. Tonia Brooms performed robotic bronchoscopy on 04/02/2023.  Biopsy showed adenocarcinoma.     Patient has history of positive hepatitis C antibody with undetectable RNA load.  HCV antibody last checked 10/09/2022 was nonreactive.  Preop labs reviewed, unremarkable.   EKG 05/01/2023: NSR.  Rate 82.  Possible LAE.  Incomplete right bundle branch block.   CT super D chest 04/01/2023: IMPRESSION: 1. Part solid nodule of the left upper lobe is unchanged in size when compared with the prior exam and concerning for primary lung adenocarcinoma. 2. Coronary artery calcifications, aortic Atherosclerosis (ICD10-I70.0) and Emphysema (ICD10-J43.9).   Cath 04/30/2019: IMPRESSION: Ms. Giammanco has normal coronary arteries and mildly elevated filling pressures with an LVEDP of 26.  I believe her Myoview was false positive and her chest pain noncardiac.  The sheath was removed and a TR band was placed on the right wrist to achieve patent  hemostasis.  The patient left lab in stable condition.  She will be discharged home later today as an outpatient and will follow up with Dr. Purvis Sheffield.     Zannie Cove [facility] Short Stay Center/Anesthesiology Phone [phone] 05/02/2023 10:16 AM

## 2023-05-02 NOTE — Anesthesia Preprocedure Evaluation (Addendum)
Anesthesia Evaluation  Patient identified by MRN, date of birth, ID band Patient awake    Reviewed: Allergy & Precautions, NPO status , Patient's Chart, lab work & pertinent test results  History of Anesthesia Complications Negative for: history of anesthetic complications  Airway Mallampati: II  TM Distance: >3 FB Neck ROM: Full    Dental  (+) Edentulous Upper, Edentulous Lower   Pulmonary COPD, Current Smoker and Patient abstained from smoking.  Lung cancer    Pulmonary exam normal        Cardiovascular hypertension, Pt. on medications + CAD  Normal cardiovascular exam     Neuro/Psych Seizures -, Well Controlled,  PSYCHIATRIC DISORDERS  Depression     Mild cognitive impairment     GI/Hepatic negative GI ROS,,,(+) Hepatitis -, C  Endo/Other  diabetes, Type 2, Oral Hypoglycemic Agents   Obesity   Renal/GU negative Renal ROS     Musculoskeletal  (+) Arthritis , Rheumatoid disorders,    Abdominal   Peds  Hematology negative hematology ROS (+)   Anesthesia Other Findings   Reproductive/Obstetrics                             Anesthesia Physical Anesthesia Plan  ASA: 3  Anesthesia Plan: General   Post-op Pain Management: Tylenol PO (pre-op)*   Induction: Intravenous  PONV Risk Score and Plan: 2 and Treatment may vary due to age or medical condition, Ondansetron, Dexamethasone and Midazolam  Airway Management Planned: Double Lumen EBT  Additional Equipment: Arterial line  Intra-op Plan:   Post-operative Plan: Extubation in OR  Informed Consent: I have reviewed the patients History and Physical, chart, labs and discussed the procedure including the risks, benefits and alternatives for the proposed anesthesia with the patient or authorized representative who has indicated his/her understanding and acceptance.     Dental advisory given  Plan Discussed with: CRNA and  Anesthesiologist  Anesthesia Plan Comments: (See PAT note   Vivasight DLEBT, 2 large bore PIV)        Anesthesia Quick Evaluation

## 2023-05-03 ENCOUNTER — Other Ambulatory Visit: Payer: Self-pay

## 2023-05-03 ENCOUNTER — Encounter (HOSPITAL_COMMUNITY): Payer: Self-pay | Admitting: Thoracic Surgery (Cardiothoracic Vascular Surgery)

## 2023-05-03 ENCOUNTER — Encounter (HOSPITAL_COMMUNITY)
Admission: RE | Disposition: A | Discharge status: Home / Self Care | Payer: Self-pay | Source: Home / Self Care | Attending: Thoracic Surgery (Cardiothoracic Vascular Surgery)

## 2023-05-03 ENCOUNTER — Inpatient Hospital Stay (HOSPITAL_COMMUNITY): Payer: Medicare Other | Admitting: Anesthesiology

## 2023-05-03 ENCOUNTER — Inpatient Hospital Stay (HOSPITAL_COMMUNITY): Payer: Medicare Other

## 2023-05-03 ENCOUNTER — Inpatient Hospital Stay (HOSPITAL_COMMUNITY)
Admission: RE | Admit: 2023-05-03 | Discharge: 2023-05-08 | DRG: 164 | Disposition: A | Discharge status: Home / Self Care | Payer: Medicare Other | Attending: Thoracic Surgery (Cardiothoracic Vascular Surgery) | Admitting: Thoracic Surgery (Cardiothoracic Vascular Surgery)

## 2023-05-03 DIAGNOSIS — J939 Pneumothorax, unspecified: Secondary | ICD-10-CM | POA: Diagnosis not present

## 2023-05-03 DIAGNOSIS — Z825 Family history of asthma and other chronic lower respiratory diseases: Secondary | ICD-10-CM

## 2023-05-03 DIAGNOSIS — Z833 Family history of diabetes mellitus: Secondary | ICD-10-CM | POA: Diagnosis not present

## 2023-05-03 DIAGNOSIS — J449 Chronic obstructive pulmonary disease, unspecified: Secondary | ICD-10-CM | POA: Diagnosis not present

## 2023-05-03 DIAGNOSIS — R911 Solitary pulmonary nodule: Secondary | ICD-10-CM | POA: Diagnosis not present

## 2023-05-03 DIAGNOSIS — C3412 Malignant neoplasm of upper lobe, left bronchus or lung: Secondary | ICD-10-CM

## 2023-05-03 DIAGNOSIS — M069 Rheumatoid arthritis, unspecified: Secondary | ICD-10-CM | POA: Diagnosis present

## 2023-05-03 DIAGNOSIS — Z902 Acquired absence of lung [part of]: Secondary | ICD-10-CM | POA: Diagnosis not present

## 2023-05-03 DIAGNOSIS — I10 Essential (primary) hypertension: Secondary | ICD-10-CM

## 2023-05-03 DIAGNOSIS — J9811 Atelectasis: Secondary | ICD-10-CM | POA: Diagnosis not present

## 2023-05-03 DIAGNOSIS — Z1152 Encounter for screening for COVID-19: Secondary | ICD-10-CM | POA: Diagnosis not present

## 2023-05-03 DIAGNOSIS — Z800 Family history of malignant neoplasm of digestive organs: Secondary | ICD-10-CM | POA: Diagnosis not present

## 2023-05-03 DIAGNOSIS — Z7952 Long term (current) use of systemic steroids: Secondary | ICD-10-CM | POA: Diagnosis not present

## 2023-05-03 DIAGNOSIS — F32A Depression, unspecified: Secondary | ICD-10-CM | POA: Diagnosis present

## 2023-05-03 DIAGNOSIS — E119 Type 2 diabetes mellitus without complications: Secondary | ICD-10-CM | POA: Diagnosis present

## 2023-05-03 DIAGNOSIS — J4489 Other specified chronic obstructive pulmonary disease: Secondary | ICD-10-CM | POA: Diagnosis present

## 2023-05-03 DIAGNOSIS — F17210 Nicotine dependence, cigarettes, uncomplicated: Secondary | ICD-10-CM | POA: Diagnosis not present

## 2023-05-03 DIAGNOSIS — E782 Mixed hyperlipidemia: Secondary | ICD-10-CM | POA: Diagnosis present

## 2023-05-03 DIAGNOSIS — J9589 Other postprocedural complications and disorders of respiratory system, not elsewhere classified: Secondary | ICD-10-CM | POA: Diagnosis not present

## 2023-05-03 DIAGNOSIS — C3492 Malignant neoplasm of unspecified part of left bronchus or lung: Secondary | ICD-10-CM

## 2023-05-03 DIAGNOSIS — R918 Other nonspecific abnormal finding of lung field: Secondary | ICD-10-CM | POA: Diagnosis not present

## 2023-05-03 DIAGNOSIS — Z48813 Encounter for surgical aftercare following surgery on the respiratory system: Secondary | ICD-10-CM | POA: Diagnosis not present

## 2023-05-03 DIAGNOSIS — J439 Emphysema, unspecified: Secondary | ICD-10-CM | POA: Diagnosis present

## 2023-05-03 DIAGNOSIS — Z79899 Other long term (current) drug therapy: Secondary | ICD-10-CM | POA: Diagnosis not present

## 2023-05-03 DIAGNOSIS — J90 Pleural effusion, not elsewhere classified: Secondary | ICD-10-CM | POA: Diagnosis not present

## 2023-05-03 DIAGNOSIS — J984 Other disorders of lung: Secondary | ICD-10-CM | POA: Diagnosis not present

## 2023-05-03 DIAGNOSIS — I2510 Atherosclerotic heart disease of native coronary artery without angina pectoris: Secondary | ICD-10-CM | POA: Diagnosis not present

## 2023-05-03 DIAGNOSIS — Z4682 Encounter for fitting and adjustment of non-vascular catheter: Secondary | ICD-10-CM | POA: Diagnosis not present

## 2023-05-03 DIAGNOSIS — J9382 Other air leak: Secondary | ICD-10-CM | POA: Diagnosis not present

## 2023-05-03 DIAGNOSIS — Z7984 Long term (current) use of oral hypoglycemic drugs: Secondary | ICD-10-CM

## 2023-05-03 DIAGNOSIS — J95811 Postprocedural pneumothorax: Secondary | ICD-10-CM | POA: Diagnosis not present

## 2023-05-03 DIAGNOSIS — R9389 Abnormal findings on diagnostic imaging of other specified body structures: Secondary | ICD-10-CM | POA: Diagnosis not present

## 2023-05-03 HISTORY — PX: NODE DISSECTION: SHX5269

## 2023-05-03 HISTORY — PX: INTERCOSTAL NERVE BLOCK: SHX5021

## 2023-05-03 LAB — GLUCOSE, CAPILLARY
Glucose-Capillary: 121 mg/dL — ABNORMAL HIGH (ref 70–99)
Glucose-Capillary: 166 mg/dL — ABNORMAL HIGH (ref 70–99)
Glucose-Capillary: 166 mg/dL — ABNORMAL HIGH (ref 70–99)
Glucose-Capillary: 168 mg/dL — ABNORMAL HIGH (ref 70–99)
Glucose-Capillary: 119 mg/dL — ABNORMAL HIGH (ref 70–99)
Glucose-Capillary: 162 mg/dL — ABNORMAL HIGH (ref 70–99)

## 2023-05-03 LAB — BLOOD GAS, ARTERIAL
Acid-Base Excess: 2.7 mmol/L — ABNORMAL HIGH (ref 0.0–2.0)
Bicarbonate: 27.9 mmol/L (ref 20.0–28.0)
Drawn by: 54702
O2 Saturation: 93.8 %
Patient temperature: 37
pCO2 arterial: 44 mmHg (ref 32–48)
pH, Arterial: 7.41 (ref 7.35–7.45)
pO2, Arterial: 69 mmHg — ABNORMAL LOW (ref 83–108)

## 2023-05-03 LAB — ABO/RH: ABO/RH(D): B POS

## 2023-05-03 SURGERY — LOBECTOMY, LUNG, ROBOT-ASSISTED, USING VATS
Anesthesia: General | Site: Chest | Laterality: Left

## 2023-05-03 MED ORDER — ROSUVASTATIN CALCIUM 20 MG PO TABS
20.0000 mg | ORAL_TABLET | Freq: Every day | ORAL | Status: DC
  Administered 2023-05-03 – 2023-05-07 (×5): 20 mg via ORAL
  Filled 2023-05-03 (×5): qty 1

## 2023-05-03 MED ORDER — ORAL CARE MOUTH RINSE: 15.0000 mL | Freq: Once | OROMUCOSAL | Status: AC

## 2023-05-03 MED ORDER — BSS IO SOLN
15.0000 mL | Freq: Once | INTRAOCULAR | Status: AC
  Administered 2023-05-03: 15 mL
  Filled 2023-05-03 (×2): qty 15

## 2023-05-03 MED ORDER — MIDAZOLAM HCL 2 MG/2ML IJ SOLN
INTRAMUSCULAR | Status: AC
  Filled 2023-05-03: qty 2

## 2023-05-03 MED ORDER — LACTATED RINGERS IV SOLN: INTRAVENOUS | Status: DC

## 2023-05-03 MED ORDER — ROCURONIUM BROMIDE 10 MG/ML (PF) SYRINGE
PREFILLED_SYRINGE | INTRAVENOUS | Status: DC | PRN
  Administered 2023-05-03: 50 mg via INTRAVENOUS
  Administered 2023-05-03: 30 mg via INTRAVENOUS
  Administered 2023-05-03: 20 mg via INTRAVENOUS

## 2023-05-03 MED ORDER — BISACODYL 5 MG PO TBEC
10.0000 mg | DELAYED_RELEASE_TABLET | Freq: Every day | ORAL | Status: DC
  Administered 2023-05-03 – 2023-05-05 (×3): 10 mg via ORAL
  Filled 2023-05-03 (×4): qty 2

## 2023-05-03 MED ORDER — LACTATED RINGERS IV SOLN
INTRAVENOUS | Status: DC | PRN
Start: 2023-05-03 — End: 2023-05-03

## 2023-05-03 MED ORDER — CEFAZOLIN SODIUM-DEXTROSE 2-4 GM/100ML-% IV SOLN
INTRAVENOUS | Status: AC
  Filled 2023-05-03: qty 100

## 2023-05-03 MED ORDER — LIDOCAINE 2% (20 MG/ML) 5 ML SYRINGE
INTRAMUSCULAR | Status: DC | PRN
  Administered 2023-05-03: 60 mg via INTRAVENOUS

## 2023-05-03 MED ORDER — CHLORHEXIDINE GLUCONATE 0.12 % MT SOLN: 15.0000 mL | Freq: Once | OROMUCOSAL | Status: AC

## 2023-05-03 MED ORDER — SUGAMMADEX SODIUM 200 MG/2ML IV SOLN
INTRAVENOUS | Status: DC | PRN
  Administered 2023-05-03: 167.8 mg via INTRAVENOUS

## 2023-05-03 MED ORDER — BUPIVACAINE HCL (PF) 0.5 % IJ SOLN
INTRAMUSCULAR | Status: AC
  Filled 2023-05-03: qty 30

## 2023-05-03 MED ORDER — OXYCODONE HCL 5 MG PO TABS: 5.0000 mg | ORAL_TABLET | Freq: Once | ORAL | Status: DC | PRN

## 2023-05-03 MED ORDER — OXYCODONE HCL 5 MG PO TABS
5.0000 mg | ORAL_TABLET | ORAL | Status: DC | PRN
  Administered 2023-05-03 – 2023-05-07 (×14): 10 mg via ORAL
  Administered 2023-05-07: 5 mg via ORAL
  Administered 2023-05-07 – 2023-05-08 (×5): 10 mg via ORAL
  Filled 2023-05-03 (×20): qty 2

## 2023-05-03 MED ORDER — ACETAMINOPHEN 160 MG/5ML PO SOLN: 1000.0000 mg | Freq: Four times a day (QID) | ORAL | Status: AC

## 2023-05-03 MED ORDER — FENTANYL CITRATE (PF) 250 MCG/5ML IJ SOLN
INTRAMUSCULAR | Status: DC | PRN
  Administered 2023-05-03: 50 ug via INTRAVENOUS
  Administered 2023-05-03 (×2): 100 ug via INTRAVENOUS

## 2023-05-03 MED ORDER — CHLORHEXIDINE GLUCONATE 0.12 % MT SOLN
OROMUCOSAL | Status: AC
  Administered 2023-05-03: 15 mL via OROMUCOSAL
  Filled 2023-05-03: qty 15

## 2023-05-03 MED ORDER — PANTOPRAZOLE SODIUM 40 MG PO TBEC
40.0000 mg | DELAYED_RELEASE_TABLET | Freq: Every day | ORAL | Status: DC
  Administered 2023-05-04 – 2023-05-08 (×5): 40 mg via ORAL
  Filled 2023-05-03 (×5): qty 1

## 2023-05-03 MED ORDER — LIDOCAINE 2% (20 MG/ML) 5 ML SYRINGE
INTRAMUSCULAR | Status: AC
  Filled 2023-05-03: qty 5

## 2023-05-03 MED ORDER — CHLORHEXIDINE GLUCONATE CLOTH 2 % EX PADS
6.0000 | MEDICATED_PAD | Freq: Once | CUTANEOUS | Status: AC
  Administered 2023-05-03: 6 via TOPICAL

## 2023-05-03 MED ORDER — ONDANSETRON HCL 4 MG/2ML IJ SOLN
INTRAMUSCULAR | Status: DC | PRN
  Administered 2023-05-03: 4 mg via INTRAVENOUS

## 2023-05-03 MED ORDER — ONDANSETRON HCL 4 MG/2ML IJ SOLN: 4.0000 mg | Freq: Four times a day (QID) | INTRAMUSCULAR | Status: DC | PRN

## 2023-05-03 MED ORDER — INDOCYANINE GREEN 25 MG IV SOLR
INTRAVENOUS | Status: AC
  Filled 2023-05-03: qty 10

## 2023-05-03 MED ORDER — ACETAMINOPHEN 500 MG PO TABS
ORAL_TABLET | ORAL | Status: AC
  Administered 2023-05-03: 1000 mg via ORAL
  Filled 2023-05-03: qty 2

## 2023-05-03 MED ORDER — ENOXAPARIN SODIUM 40 MG/0.4ML IJ SOSY
40.0000 mg | PREFILLED_SYRINGE | Freq: Every day | INTRAMUSCULAR | Status: DC
  Administered 2023-05-04 – 2023-05-08 (×6): 40 mg via SUBCUTANEOUS
  Filled 2023-05-03 (×6): qty 0.4

## 2023-05-03 MED ORDER — INSULIN ASPART 100 UNIT/ML IJ SOLN
0.0000 [IU] | Freq: Four times a day (QID) | INTRAMUSCULAR | Status: DC
  Administered 2023-05-03 (×2): 4 [IU] via SUBCUTANEOUS

## 2023-05-03 MED ORDER — PROMETHAZINE HCL 25 MG/ML IJ SOLN: 6.2500 mg | INTRAMUSCULAR | Status: DC | PRN

## 2023-05-03 MED ORDER — OXYCODONE HCL 5 MG/5ML PO SOLN: 5.0000 mg | Freq: Once | ORAL | Status: DC | PRN

## 2023-05-03 MED ORDER — EPHEDRINE SULFATE-NACL 50-0.9 MG/10ML-% IV SOSY
PREFILLED_SYRINGE | INTRAVENOUS | Status: DC | PRN
  Administered 2023-05-03: 5 mg via INTRAVENOUS

## 2023-05-03 MED ORDER — CEFAZOLIN SODIUM-DEXTROSE 2-4 GM/100ML-% IV SOLN
2.0000 g | Freq: Three times a day (TID) | INTRAVENOUS | Status: AC
  Administered 2023-05-03 – 2023-05-04 (×2): 2 g via INTRAVENOUS
  Filled 2023-05-03 (×2): qty 100

## 2023-05-03 MED ORDER — MORPHINE SULFATE (PF) 2 MG/ML IV SOLN
2.0000 mg | INTRAVENOUS | Status: DC | PRN
  Administered 2023-05-03 – 2023-05-05 (×9): 2 mg via INTRAVENOUS
  Filled 2023-05-03 (×10): qty 1

## 2023-05-03 MED ORDER — KETOROLAC TROMETHAMINE 0.5 % OP SOLN
1.0000 [drp] | Freq: Four times a day (QID) | OPHTHALMIC | Status: AC
  Administered 2023-05-03 – 2023-05-04 (×3): 1 [drp] via OPHTHALMIC
  Filled 2023-05-03 (×2): qty 5

## 2023-05-03 MED ORDER — CIPROFLOXACIN HCL 500 MG PO TABS
500.0000 mg | ORAL_TABLET | Freq: Two times a day (BID) | ORAL | Status: AC
  Administered 2023-05-03 – 2023-05-04 (×2): 500 mg via ORAL
  Filled 2023-05-03 (×2): qty 1

## 2023-05-03 MED ORDER — EPHEDRINE 5 MG/ML INJ
INTRAVENOUS | Status: AC
  Filled 2023-05-03: qty 5

## 2023-05-03 MED ORDER — GABAPENTIN 300 MG PO CAPS
300.0000 mg | ORAL_CAPSULE | Freq: Two times a day (BID) | ORAL | Status: DC
  Administered 2023-05-06 – 2023-05-08 (×4): 300 mg via ORAL
  Filled 2023-05-03 (×4): qty 1

## 2023-05-03 MED ORDER — KETOROLAC TROMETHAMINE 15 MG/ML IJ SOLN
INTRAMUSCULAR | Status: AC
  Filled 2023-05-03: qty 1

## 2023-05-03 MED ORDER — CEFAZOLIN SODIUM-DEXTROSE 2-4 GM/100ML-% IV SOLN
2.0000 g | INTRAVENOUS | Status: AC
  Administered 2023-05-03: 2 g via INTRAVENOUS

## 2023-05-03 MED ORDER — PROPOFOL 10 MG/ML IV BOLUS
INTRAVENOUS | Status: DC | PRN
  Administered 2023-05-03: 120 mg via INTRAVENOUS
  Administered 2023-05-03: 50 mg via INTRAVENOUS

## 2023-05-03 MED ORDER — FENTANYL CITRATE (PF) 250 MCG/5ML IJ SOLN
INTRAMUSCULAR | Status: AC
  Filled 2023-05-03: qty 5

## 2023-05-03 MED ORDER — DEXAMETHASONE SODIUM PHOSPHATE 10 MG/ML IJ SOLN
INTRAMUSCULAR | Status: DC | PRN
  Administered 2023-05-03: 10 mg via INTRAVENOUS

## 2023-05-03 MED ORDER — INSULIN ASPART 100 UNIT/ML IJ SOLN
INTRAMUSCULAR | Status: DC | PRN
  Administered 2023-05-03: 2 [IU] via SUBCUTANEOUS

## 2023-05-03 MED ORDER — LEVETIRACETAM ER 500 MG PO TB24
500.0000 mg | ORAL_TABLET | Freq: Every day | ORAL | Status: DC
  Administered 2023-05-03 – 2023-05-07 (×5): 500 mg via ORAL
  Filled 2023-05-03 (×7): qty 1

## 2023-05-03 MED ORDER — DULOXETINE HCL 60 MG PO CPEP
120.0000 mg | ORAL_CAPSULE | Freq: Every day | ORAL | Status: DC
  Administered 2023-05-03 – 2023-05-07 (×5): 120 mg via ORAL
  Filled 2023-05-03 (×5): qty 2

## 2023-05-03 MED ORDER — LISINOPRIL 10 MG PO TABS: 10.0000 mg | ORAL_TABLET | Freq: Every day | ORAL | Status: DC

## 2023-05-03 MED ORDER — ROCURONIUM BROMIDE 10 MG/ML (PF) SYRINGE
PREFILLED_SYRINGE | INTRAVENOUS | Status: AC
  Filled 2023-05-03: qty 10

## 2023-05-03 MED ORDER — DEXAMETHASONE SODIUM PHOSPHATE 10 MG/ML IJ SOLN
INTRAMUSCULAR | Status: AC
  Filled 2023-05-03: qty 1

## 2023-05-03 MED ORDER — PROPOFOL 10 MG/ML IV BOLUS
INTRAVENOUS | Status: AC
  Filled 2023-05-03: qty 20

## 2023-05-03 MED ORDER — ONDANSETRON HCL 4 MG/2ML IJ SOLN
INTRAMUSCULAR | Status: AC
  Filled 2023-05-03: qty 2

## 2023-05-03 MED ORDER — ACETAMINOPHEN 500 MG PO TABS
1000.0000 mg | ORAL_TABLET | Freq: Four times a day (QID) | ORAL | Status: AC
  Administered 2023-05-03 – 2023-05-08 (×19): 1000 mg via ORAL
  Filled 2023-05-03 (×19): qty 2

## 2023-05-03 MED ORDER — SODIUM CHLORIDE 0.9% IV SOLUTION
INTRAVENOUS | Status: AC | PRN
  Administered 2023-05-03: 1000 mL via INTRAMUSCULAR

## 2023-05-03 MED ORDER — GABAPENTIN 300 MG PO CAPS
300.0000 mg | ORAL_CAPSULE | Freq: Every day | ORAL | Status: AC
  Administered 2023-05-03 – 2023-05-05 (×3): 300 mg via ORAL
  Filled 2023-05-03 (×3): qty 1

## 2023-05-03 MED ORDER — PHENYLEPHRINE 80 MCG/ML (10ML) SYRINGE FOR IV PUSH (FOR BLOOD PRESSURE SUPPORT)
PREFILLED_SYRINGE | INTRAVENOUS | Status: DC | PRN
  Administered 2023-05-03: 80 ug via INTRAVENOUS
  Administered 2023-05-03: 120 ug via INTRAVENOUS
  Administered 2023-05-03 (×2): 80 ug via INTRAVENOUS

## 2023-05-03 MED ORDER — 0.9 % SODIUM CHLORIDE (POUR BTL) OPTIME
TOPICAL | Status: DC | PRN
  Administered 2023-05-03: 2000 mL

## 2023-05-03 MED ORDER — KETOROLAC TROMETHAMINE 15 MG/ML IJ SOLN
15.0000 mg | Freq: Four times a day (QID) | INTRAMUSCULAR | Status: AC
  Administered 2023-05-03 – 2023-05-05 (×8): 15 mg via INTRAVENOUS
  Filled 2023-05-03 (×8): qty 1

## 2023-05-03 MED ORDER — MIDAZOLAM HCL 2 MG/2ML IJ SOLN
INTRAMUSCULAR | Status: DC | PRN
  Administered 2023-05-03: 1 mg via INTRAVENOUS

## 2023-05-03 MED ORDER — BUPIVACAINE LIPOSOME 1.3 % IJ SUSP
INTRAMUSCULAR | Status: AC
  Filled 2023-05-03: qty 20

## 2023-05-03 MED ORDER — FENTANYL CITRATE (PF) 100 MCG/2ML IJ SOLN
INTRAMUSCULAR | Status: AC
  Filled 2023-05-03: qty 2

## 2023-05-03 MED ORDER — SODIUM CHLORIDE FLUSH 0.9 % IV SOLN
INTRAVENOUS | Status: DC | PRN
  Administered 2023-05-03: 100 mL

## 2023-05-03 MED ORDER — INDOCYANINE GREEN 25 MG IV SOLR
INTRAVENOUS | Status: DC | PRN
Start: 2023-05-03 — End: 2023-05-03
  Administered 2023-05-03: 25 mg via INTRAVENOUS

## 2023-05-03 MED ORDER — PHENYLEPHRINE HCL-NACL 20-0.9 MG/250ML-% IV SOLN
INTRAVENOUS | Status: DC | PRN
  Administered 2023-05-03: 40 ug/min via INTRAVENOUS

## 2023-05-03 MED ORDER — FENTANYL CITRATE (PF) 100 MCG/2ML IJ SOLN
25.0000 ug | INTRAMUSCULAR | Status: DC | PRN
  Administered 2023-05-03: 50 ug via INTRAVENOUS

## 2023-05-03 MED ORDER — POLYMYXIN B-TRIMETHOPRIM 10000-0.1 UNIT/ML-% OP SOLN
1.0000 [drp] | Freq: Four times a day (QID) | OPHTHALMIC | Status: AC
  Administered 2023-05-03 – 2023-05-04 (×3): 1 [drp] via OPHTHALMIC
  Filled 2023-05-03 (×2): qty 10

## 2023-05-03 MED ORDER — ACETAMINOPHEN 500 MG PO TABS: 1000.0000 mg | ORAL_TABLET | Freq: Once | ORAL | Status: AC

## 2023-05-03 MED ORDER — SODIUM CHLORIDE 0.45 % IV SOLN: INTRAVENOUS | Status: DC

## 2023-05-03 MED ORDER — SENNOSIDES-DOCUSATE SODIUM 8.6-50 MG PO TABS
1.0000 | ORAL_TABLET | Freq: Every day | ORAL | Status: DC
  Administered 2023-05-03 – 2023-05-05 (×3): 1 via ORAL
  Filled 2023-05-03 (×3): qty 1

## 2023-05-03 MED ORDER — PHENYLEPHRINE 80 MCG/ML (10ML) SYRINGE FOR IV PUSH (FOR BLOOD PRESSURE SUPPORT)
PREFILLED_SYRINGE | INTRAVENOUS | Status: AC
  Filled 2023-05-03: qty 10

## 2023-05-03 SURGICAL SUPPLY — 93 items
ADH SKN CLS APL DERMABOND .7 (GAUZE/BANDAGES/DRESSINGS) ×1
BAG TISS RTRVL C300 12X14 (MISCELLANEOUS) ×1
BLADE CLIPPER SURG (BLADE) IMPLANT
CANISTER SUCT 3000ML PPV (MISCELLANEOUS) ×2 IMPLANT
CANNULA REDUCER 12-8 DVNC XI (CANNULA) ×2 IMPLANT
CNTNR URN SCR LID CUP LEK RST (MISCELLANEOUS) ×5 IMPLANT
CONN ST 1/4X3/8 BEN (MISCELLANEOUS) IMPLANT
CONT SPEC 4OZ STRL OR WHT (MISCELLANEOUS) ×10
DEFOGGER SCOPE WARMER CLEARIFY (MISCELLANEOUS) ×1 IMPLANT
DERMABOND ADVANCED .7 DNX12 (GAUZE/BANDAGES/DRESSINGS) ×1 IMPLANT
DRAIN CHANNEL 28F RND 3/8 FF (WOUND CARE) IMPLANT
DRAIN CHANNEL 32F RND 10.7 FF (WOUND CARE) IMPLANT
DRAPE ARM DVNC X/XI (DISPOSABLE) ×4 IMPLANT
DRAPE COLUMN DVNC XI (DISPOSABLE) ×1 IMPLANT
DRAPE CV SPLIT W-CLR ANES SCRN (DRAPES) ×1 IMPLANT
DRAPE HALF SHEET 40X57 (DRAPES) ×1 IMPLANT
DRAPE INCISE IOBAN 66X45 STRL (DRAPES) IMPLANT
DRAPE ORTHO SPLIT 77X108 STRL (DRAPES) ×1
DRAPE SURG ORHT 6 SPLT 77X108 (DRAPES) ×1 IMPLANT
ELECT BLADE 6.5 EXT (BLADE) ×1 IMPLANT
ELECT REM PT RETURN 9FT ADLT (ELECTROSURGICAL) ×1
ELECTRODE REM PT RTRN 9FT ADLT (ELECTROSURGICAL) ×1 IMPLANT
FORCEPS BPLR FENES DVNC XI (FORCEP) IMPLANT
FORCEPS BPLR LNG DVNC XI (INSTRUMENTS) IMPLANT
GAUZE KITTNER 4X5 RF (MISCELLANEOUS) ×2 IMPLANT
GAUZE SPONGE 4X4 12PLY STRL (GAUZE/BANDAGES/DRESSINGS) ×1 IMPLANT
GLOVE SS BIOGEL STRL SZ 7.5 (GLOVE) ×2 IMPLANT
GLOVE SURG POLYISO LF SZ8 (GLOVE) ×1 IMPLANT
GOWN STRL REUS W/ TWL LRG LVL3 (GOWN DISPOSABLE) ×2 IMPLANT
GOWN STRL REUS W/ TWL XL LVL3 (GOWN DISPOSABLE) ×2 IMPLANT
GOWN STRL REUS W/TWL 2XL LVL3 (GOWN DISPOSABLE) ×1 IMPLANT
GOWN STRL REUS W/TWL LRG LVL3 (GOWN DISPOSABLE) ×2
GOWN STRL REUS W/TWL XL LVL3 (GOWN DISPOSABLE) ×2
GRASPER TIP-UP FEN DVNC XI (INSTRUMENTS) IMPLANT
HEMOSTAT SURGICEL 2X14 (HEMOSTASIS) ×3 IMPLANT
IRRIGATION STRYKERFLOW (MISCELLANEOUS) ×1 IMPLANT
IRRIGATOR STRYKERFLOW (MISCELLANEOUS) ×1
KIT BASIN OR (CUSTOM PROCEDURE TRAY) ×1 IMPLANT
KIT SUCTION CATH 14FR (SUCTIONS) IMPLANT
KIT TURNOVER KIT B (KITS) ×1 IMPLANT
NDL HYPO 25GX1X1/2 BEV (NEEDLE) ×1 IMPLANT
NDL SPNL 22GX3.5 QUINCKE BK (NEEDLE) ×1 IMPLANT
NEEDLE HYPO 25GX1X1/2 BEV (NEEDLE) ×1 IMPLANT
NEEDLE SPNL 22GX3.5 QUINCKE BK (NEEDLE) ×1 IMPLANT
NS IRRIG 1000ML POUR BTL (IV SOLUTION) ×2 IMPLANT
PACK CHEST (CUSTOM PROCEDURE TRAY) ×1 IMPLANT
PAD ARMBOARD 7.5X6 YLW CONV (MISCELLANEOUS) ×2 IMPLANT
PORT ACCESS TROCAR AIRSEAL 12 (TROCAR) ×1 IMPLANT
RELOAD STAPLE 45 2.0 GRY DVNC (STAPLE) IMPLANT
RELOAD STAPLE 45 2.5 WHT DVNC (STAPLE) IMPLANT
RELOAD STAPLE 45 3.5 BLU DVNC (STAPLE) IMPLANT
RELOAD STAPLE 45 4.3 GRN DVNC (STAPLE) IMPLANT
RELOAD STAPLE 45 4.6 BLK DVNC (STAPLE) IMPLANT
RELOAD STAPLER 2.5X45 WHT DVNC (STAPLE) ×5 IMPLANT
RELOAD STAPLER 3.5X45 BLU DVNC (STAPLE) ×4 IMPLANT
RELOAD STAPLER 4.3X45 GRN DVNC (STAPLE) ×3 IMPLANT
RELOAD STAPLER 45 4.6 BLK DVNC (STAPLE) ×2 IMPLANT
SCISSORS LAP 5X35 DISP (ENDOMECHANICALS) IMPLANT
SEAL UNIV 5-12 XI (MISCELLANEOUS) ×4 IMPLANT
SET TRI-LUMEN FLTR TB AIRSEAL (TUBING) ×1 IMPLANT
SOL ELECTROSURG ANTI STICK (MISCELLANEOUS) ×1
SOLUTION ELECTROSURG ANTI STCK (MISCELLANEOUS) ×1 IMPLANT
SPONGE INTESTINAL PEANUT (DISPOSABLE) IMPLANT
SPONGE TONSIL 1 RF SGL (DISPOSABLE) IMPLANT
STAPLE RELOAD 45 2.0 GRAY DVNC (STAPLE) ×1 IMPLANT
STAPLER 45 SUREFORM CVD DVNC (STAPLE) IMPLANT
STAPLER 45 SUREFORM DVNC (STAPLE) IMPLANT
STAPLER RELOAD 2.5X45 WHT DVNC (STAPLE) ×5
STAPLER RELOAD 3.5X45 BLU DVNC (STAPLE) ×4
STAPLER RELOAD 4.3X45 GRN DVNC (STAPLE) ×3
STAPLER RELOAD 45 4.6 BLK DVNC (STAPLE) ×2
SUT PROLENE 4 0 RB 1 (SUTURE)
SUT PROLENE 4-0 RB1 .5 CRCL 36 (SUTURE) IMPLANT
SUT SILK 1 MH (SUTURE) ×1 IMPLANT
SUT SILK 2 0 SH (SUTURE) ×1 IMPLANT
SUT SILK 2 0SH CR/8 30 (SUTURE) IMPLANT
SUT SILK 3 0SH CR/8 30 (SUTURE) IMPLANT
SUT VIC AB 1 CTX 36 (SUTURE) ×1
SUT VIC AB 1 CTX36XBRD ANBCTR (SUTURE) ×1 IMPLANT
SUT VIC AB 2-0 CTX 36 (SUTURE) ×1 IMPLANT
SUT VIC AB 3-0 X1 27 (SUTURE) ×2 IMPLANT
SUT VICRYL 0 TIES 12 18 (SUTURE) ×1 IMPLANT
SUT VICRYL 0 UR6 27IN ABS (SUTURE) ×2 IMPLANT
SUT VICRYL 2 TP 1 (SUTURE) IMPLANT
SYR 20CC LL (SYRINGE) ×2 IMPLANT
SYSTEM RETRIEVAL ANCHOR 12 (MISCELLANEOUS) IMPLANT
SYSTEM SAHARA CHEST DRAIN ATS (WOUND CARE) ×1 IMPLANT
TAPE CLOTH 4X10 WHT NS (GAUZE/BANDAGES/DRESSINGS) ×1 IMPLANT
TAPE CLOTH SURG 4X10 WHT LF (GAUZE/BANDAGES/DRESSINGS) IMPLANT
TOWEL GREEN STERILE (TOWEL DISPOSABLE) ×1 IMPLANT
TRAY FOLEY MTR SLVR 16FR STAT (SET/KITS/TRAYS/PACK) ×1 IMPLANT
TRAY FOLEY W/BAG SLVR 14FR (SET/KITS/TRAYS/PACK) IMPLANT
WATER STERILE IRR 1000ML POUR (IV SOLUTION) ×2 IMPLANT

## 2023-05-03 NOTE — Anesthesia Postprocedure Evaluation (Signed)
Anesthesia Post Note  Patient: MALIJAH RUBERT  Procedure(s) Performed: XI ROBOTIC ASSISTED THORACOSCOPY-LEFT UPPER LOBECTOMY (Left: Chest) NODE DISSECTION (Left: Chest) INTERCOSTAL NERVE BLOCK (Left: Chest)     Patient location during evaluation: PACU Anesthesia Type: General Level of consciousness: awake and alert Pain management: pain level controlled Vital Signs Assessment: post-procedure vital signs reviewed and stable Respiratory status: spontaneous breathing, nonlabored ventilation, respiratory function stable and patient connected to nasal cannula oxygen Cardiovascular status: blood pressure returned to baseline and stable Postop Assessment: no apparent nausea or vomiting Anesthetic complications: no   No notable events documented.  Last Vitals:  Vitals:   05/03/23 1215 05/03/23 1224  BP: (!) 112/56 (!) 111/51  Pulse: 86 86  Resp: 12 14  Temp:  36.5 C  SpO2: 96% 96%    Last Pain:  Vitals:   05/03/23 1200  TempSrc:   PainSc: Asleep                 Beryle Lathe

## 2023-05-03 NOTE — Telephone Encounter (Signed)
Attempted to contact patient, no answer, left vm for return call.   MyChart message also sent.

## 2023-05-03 NOTE — Discharge Instructions (Signed)
Robot-Assisted Thoracic Surgery, Care After The following information offers guidance on how to care for yourself after your procedure. Your health care provider may also give you more specific instructions. If you have problems or questions, contact your health care provider. What can I expect after the procedure? After the procedure, it is common to have: Some pain and aches in the area of your surgical incisions. Pain when breathing in (inhaling) and coughing. Tiredness (fatigue). Trouble sleeping. Constipation. Follow these instructions at home: Medicines Take over-the-counter and prescription medicines only as told by your health care provider. If you were prescribed an antibiotic medicine, take it as told by your health care provider. Do not stop taking the antibiotic even if you start to feel better. Talk with your health care provider about safe and effective ways to manage pain after your procedure. Pain management should fit your specific health needs. Take pain medicine before pain becomes severe. Relieving and controlling your pain will make breathing easier for you. Ask your health care provider if the medicine prescribed to you requires you to avoid driving or using machinery. Eating and drinking Follow instructions from your health care provider about eating or drinking restrictions. These will vary depending on what procedure you had. Your health care provider may recommend: A liquid diet or soft diet for the first few days. Meals that are smaller and more frequent. A diet of fruits, vegetables, whole grains, and low-fat proteins. Limiting foods that are high in fat and processed sugar, including fried or sweet foods. Incision care Follow instructions from your health care provider about how to take care of your incisions. Make sure you: Wash your hands with soap and water for at least 20 seconds before and after you change your bandage (dressing). If soap and water are not  available, use hand sanitizer. Change your dressing as told by your health care provider. Leave stitches (sutures), skin glue, or adhesive strips in place. These skin closures may need to stay in place for 2 weeks or longer. If adhesive strip edges start to loosen and curl up, you may trim the loose edges. Do not remove adhesive strips completely unless your health care provider tells you to do that. Check your incision area every day for signs of infection. Check for: Redness, swelling, or more pain. Fluid or blood. Warmth. Pus or a bad smell. Activity Return to your normal activities as told by your health care provider. Ask your health care provider what activities are safe for you. Ask your health care provider when it is safe for you to drive. Do not lift anything that is heavier than 10 lb (4.5 kg), or the limit that you are told, until your health care provider says that it is safe. Rest as told by your health care provider. Avoid sitting for a long time without moving. Get up to take short walks every 1-2 hours. This is important to improve blood flow and breathing. Ask for help if you feel weak or unsteady. Do exercises as told by your health care provider. Pneumonia prevention  Do deep breathing exercises and cough regularly as directed. This helps clear mucus and opens your lungs. Doing this helps prevent lung infection (pneumonia). If you were given an incentive spirometer, use it as told. An incentive spirometer is a tool that measures how well you are filling your lungs with each breath. Coughing may hurt less if you try to support your chest. This is called splinting. Try one of these when you  cough: Hold a pillow against your chest. Place the palms of both hands on top of your incision area. Do not use any products that contain nicotine or tobacco. These products include cigarettes, chewing tobacco, and vaping devices, such as e-cigarettes. If you need help quitting, ask your  health care provider. Avoid secondhand smoke. General instructions If you have a drainage tube: Follow instructions from your health care provider about how to take care of it. Do not travel by airplane after your tube is removed until your health care provider tells you it is safe. You may need to take these actions to prevent or treat constipation: Drink enough fluid to keep your urine pale yellow. Take over-the-counter or prescription medicines. Eat foods that are high in fiber, such as beans, whole grains, and fresh fruits and vegetables. Limit foods that are high in fat and processed sugars, such as fried or sweet foods. Keep all follow-up visits. This is important. Contact a health care provider if: You have redness, swelling, or more pain around an incision. You have fluid or blood coming from an incision. An incision feels warm to the touch. You have pus or a bad smell coming from an incision. You have a fever. You cannot eat or drink without vomiting. Your pain medicine is not controlling your pain. Get help right away if: You have chest pain. Your heart is beating quickly. You have trouble breathing. You have trouble speaking. You are confused. You feel weak or dizzy, or you faint. These symptoms may represent a serious problem that is an emergency. Do not wait to see if the symptoms will go away. Get medical help right away. Call your local emergency services (911 in the [place] Do not drive yourself to the hospital. Summary Talk with your health care provider about safe and effective ways to manage pain after your procedure. Pain management should fit your specific health needs. Return to your normal activities as told by your health care provider. Ask your health care provider what activities are safe for you. Do deep breathing exercises and cough regularly as directed. This helps to clear mucus and prevent pneumonia. If it hurts to cough, ease pain by holding a pillow  against your chest or by placing the palms of both hands over your incisions. This information is not intended to replace advice given to you by your health care provider. Make sure you discuss any questions you have with your health care provider. Document Revised: 05/12/2020 Document Reviewed: 05/13/2020 Elsevier Patient Education  2024 [employer]  Discharge Instructions:  1. You may shower, please wash incisions daily with soap and water and keep dry.  If you wish to cover wounds with dressing you may do so but please keep clean and change daily.  No tub baths or swimming until incisions have completely healed.  If your incisions become red or develop any drainage please call our office at [phone]  2. No Driving until cleared by Dr. Sunday Corn office and you are no longer using narcotic pain medications  3. Fever of 101.5 for at least 24 hours with no source, please contact our office at [phone]  4. Activity- up as tolerated, please walk at least 3 times per day.  Avoid strenuous activity, no lifting, pushing, or pulling with your arms over 8-10 lbs for a minimum of 6 weeks  5. If any questions or concerns arise, please do not hesitate to contact our office at [phone]

## 2023-05-03 NOTE — Transfer of Care (Signed)
Immediate Anesthesia Transfer of Care Note  Patient: Christina Jenkins  Procedure(s) Performed: XI ROBOTIC ASSISTED THORACOSCOPY-LEFT UPPER LOBECTOMY (Left: Chest) NODE DISSECTION (Left: Chest) INTERCOSTAL NERVE BLOCK (Left: Chest)  Patient Location: PACU  Anesthesia Type:General  Level of Consciousness: drowsy  Airway & Oxygen Therapy: Patient Spontanous Breathing  Post-op Assessment: Report given to RN and Post -op Vital signs reviewed and stable  Post vital signs: Reviewed and stable  Last Vitals:  Vitals Value Taken Time  BP 131/57   Temp    Pulse 85   Resp 15   SpO2 94     Last Pain:  Vitals:   05/03/23 0609  TempSrc:   PainSc: 7          Complications: No notable events documented.

## 2023-05-03 NOTE — Brief Op Note (Signed)
05/03/2023  11:20 AM  PATIENT:  Christina Jenkins  62 y.o. female  PRE-OPERATIVE DIAGNOSIS:  adenocarcinoma of left upper lobe  POST-OPERATIVE DIAGNOSIS:  adenocarcinoma of left upper lobe  PROCEDURE:  XI ROBOTIC ASSISTED THORACOSCOPY-LINGULA SPARING LOBECTOMY (Left) NODE DISSECTION (Left) INTERCOSTAL NERVE BLOCK (Left)  SURGEON:  Surgeons and Role:    * [name], MD - Primary  PHYSICIAN ASSISTANT: Aloha Gell PA-C  ASSISTANTS: none   ANESTHESIA:   local and general  EBL:  30 mL   BLOOD ADMINISTERED:none  DRAINS:  left pleural drain    LOCAL MEDICATIONS USED:  OTHER Exparel  SPECIMEN:  Source of Specimen:  Anterior posterior segment  DISPOSITION OF SPECIMEN:  PATHOLOGY  COUNTS:  YES  DICTATION: .Dragon Dictation  PLAN OF CARE: Admit to inpatient   PATIENT DISPOSITION:  PACU - hemodynamically stable.   Delay start of Pharmacological VTE agent (>24hrs) due to surgical blood loss or risk of bleeding: no

## 2023-05-03 NOTE — Anesthesia Procedure Notes (Signed)
Procedure Name: Intubation Date/Time: 05/03/2023 7:47 AM  Performed by: Randon Goldsmith, CRNAPre-anesthesia Checklist: Patient identified, Emergency Drugs available, Suction available and Patient being monitored Patient Re-evaluated:Patient Re-evaluated prior to induction Oxygen Delivery Method: Circle system utilized Preoxygenation: Pre-oxygenation with 100% oxygen Induction Type: IV induction Ventilation: Mask ventilation without difficulty Laryngoscope Size: Mac and 4 Grade View: Grade I Endobronchial tube: Left and Double lumen EBT and 37 Fr Number of attempts: 1 Airway Equipment and Method: Stylet and Oral airway Placement Confirmation: ETT inserted through vocal cords under direct vision, positive ETCO2 and breath sounds checked- equal and bilateral Secured at: 28 cm Tube secured with: Tape Dental Injury: Teeth and Oropharynx as per pre-operative assessment  Comments: Vivasight tube confirmed with camera

## 2023-05-03 NOTE — Hospital Course (Signed)
HPI:  Christina Jenkins sent for consultation regarding an adenocarcinoma of the left upper lobe.   [name] is a 62 year old woman with a history of tobacco abuse, COPD, hepatitis C, hyperlipidemia, type 2 diabetes, hypokalemia, depression, and rheumatoid arthritis.  She has smoked 2 packs a day for 40 years.  Currently smoking about a pack a day.  She had a low-dose CT for lung cancer screening in June.  It showed a left upper lobe nodule.  Super D CT in July showed a mixed density nodule in the left upper lobe measuring about 17 mm with a 9 mm solid component.  She saw Dr. Sherene Sires and was referred for navigational bronchoscopy.  Dr. Tonia Brooms performed robotic bronchoscopy on 04/02/2023.  Biopsy showed adenocarcinoma.   She is still smoking and is down to about a pack a day.  She denies chest pain, pressure, or tightness.  She gets short of breath with heavy exertion but can walk up a flight of stairs without stopping.  No cough or hemoptysis.  She has lost about 11 pounds over the past 3 months and about 20 pounds going back 6 months.  She attributes that to metformin.  Her appetite is good.  No unusual headaches or visual changes.  Disabled secondary to rheumatoid arthritis.  Dr. Dorris Fetch reviewed the patient's diagnostic studies and determined she would benefit from surgical intervention. He reviewed the treatment options as well as the risks and benefits of surgery with the patient. Ms. Lykken was agreeable to proceed with surgery.  Hospital Course: Ms. Mcgauley arrived at [facility] and was brought to the operating room on 05/03/23. She underwent robotic assisted thoracoscopy with left lingula sparing lobectomy. She tolerated the procedure well, was extubated and transferred to the PACU in stable condition.

## 2023-05-03 NOTE — Op Note (Signed)
NAME: Christina Jenkins, Christina Jenkins. MEDICAL RECORD NO: [id number] ACCOUNT NO: [id number] DATE OF BIRTH: Jan 23, 1961 FACILITY: [facility] LOCATION: [facility] PHYSICIAN: Salvatore Decent. Dorris Fetch, MD  Operative Report   DATE OF PROCEDURE: 05/03/2023  PREOPERATIVE DIAGNOSIS:  Adenocarcinoma, left upper lobe, clinical stage IA (T1, N0).  POSTOPERATIVE DIAGNOSIS:  Adenocarcinoma, left upper lobe, clinical stage IA (T1, N0).  PROCEDURE:   Robotic left VATS,  Lingular sparing left upper lobectomy,  Lymph node dissection and  Intercostal nerve blocks levels 4 through 10.  SURGEON:  Salvatore Decent. Dorris Fetch, MD  ASSISTANT:  Aloha Gell, PA.  ANESTHESIA:  General.  FINDINGS:  Level 12 and 13 nodes negative for carcinoma.  Remaining lymph nodes normal in appearance.  Margin free of invasive carcinoma.  CLINICAL NOTE: Mrs. Roccia is a 62 year old woman with a history of tobacco abuse, who was found to have a nodule on a low dose CT for lung cancer screening.  Robotic bronchoscopy showed adenocarcinoma.  She was offered surgical resection versus stereotactic radiation.  The advantages and disadvantages of each of those were discussed in detail with the patient.  She wished to proceed with surgical resection.  The indications, risks, benefits, and alternatives were discussed and she accepted the risks.  OPERATIVE NOTE: This Baumhover was brought to the preoperative holding area on 05/03/2023.  The anesthesia service placed an arterial blood pressure monitoring line and established intravenous access.  She was taken to the operating room and anesthetized  and intubated with a double lumen endotracheal tube.  Intravenous antibiotics were administered.  A Foley catheter was placed.  Sequential compression devices were placed on the calves for DVT prophylaxis.  She was placed in a right lateral decubitus position.  A Bair Hugger was placed for active warming.  The left chest was prepped and draped in the usual sterile fashion.  Single  lung ventilation of the right lung was initiated.  A timeout was performed.  A solution containing 20 mL of liposomal bupivacaine, 30 mL of 0.5% bupivacaine and 50 mL of saline was prepared.  This solution was used for the intercostal nerve blocks as well as local at the incision sites.  An incision was  made in the eighth interspace in approximately the midaxillary line, and an 8 mm robotic port was inserted.  Thoracoscope was advanced into the chest.  After confirming intrapleural placement, carbon dioxide was insufflated per protocol.  A 12 mm robotic port was placed in the eighth interspace anterior to the camera port.  Intercostal nerve blocks then were performed from the fourth to the tenth interspace by injecting 10 mL of the bupivacaine solution into a subpleural plane at each level.  A 12 mm AirSeal port was placed in the tenth interspace posterolaterally.  Two additional eighth interspace robotic ports were placed, the robot was deployed.  The camera arm was docked, targeting was performed.  The remaining arms were docked.  Robotic instruments were inserted with thoracoscopic visualization.  There was good isolation of the left lung, but it was relatively slow to deflate. The lung was retracted superiorly.  The inferior ligament was divided with bipolar cautery.  All lymph nodes that were encountered were sent for permanent pathology as  separate specimens with the exception of the 2 nodes that were sent for frozen.   Level 8 and 9 nodes were removed.  The lung then was retracted anteriorly and the pleural reflection was divided at the hilum posteriorly.  Level 7 and 10 nodes were removed.  Working further superiorly a  level 10 node and 4L nodes were removed and level 5 nodes were removed as well.  The pleural reflection was divided at the hilum anteriorly and a level 11 node was removed.  Inspection of the fissure revealed the pulmonary  artery visible in the posterior aspect of the fissure.  The  pleura overlying it was incised, and a plane was developed superficial to the pulmonary artery.  The fissure was completed posteriorly with the robotic stapler.  The posterior ascending branch was encircled and divided with the robotic stapler and then the apical segmental branch was easily visible and it was encircled and divided with a stapler.  There were relatively large level 12 and 13 nodes encountered as the dissection was carried towards the bronchus.  These were removed and sent for frozen section and both returned with no tumor seen.  The non-lingular upper lobe vein branches were encircled and divided with the robotic stapler and then the anterior apical segmental PA branch was identified.  It was dissected out from both anterior and posterior approaches and then divided with the robotic stapler.  The lingular vessels were preserved as was the lingular bronchus.  The non-lingular segmental bronchus was encircled, the stapler was placed across it and closed. A test inflation showed good aeration of the lingula and the lower lobe.  Stapler was fired transecting the bronchus. Intravenous ICG was administered and using the Firefly setting on the robot console, a clear demarcation was seen between the lingula and the non-lingular segments.  This line was scored with cautery.  It correlated with the differential aeration line from the test ventilation.  The segmentectomy was completed with sequential firings of the robotic stapler using black and green  cartridges.  The vessel loop and sponges that had been placed during the dissection were removed.  The chest was copiously irrigated with saline.  A test inflation showed no air leakage.  A 12 mm endoscopic retrieval bag was placed through the AirSeal  port and the specimen was placed into the bag and it was then brought down to the inferior aspect of the chest.  The robotic instruments were removed.  The robot was undocked.  The anterior eighth interspace  incision was lengthened to 3 cm.  The specimen was removed through that incision and sent for frozen section. At the margin there were some questionable atypical cells, but no tumor was seen.  There was greater than 1 cm margin grossly.  Final inspection was made for hemostasis.  A 28-French Blake drain  was placed through the original port incision and secured with #1 Silk suture.  Dual lung ventilation was resumed.  The remaining incisions were closed in standard fashion.  Dermabond was applied.  The chest tube was placed to a Pleur-Evac on waterseal.  She was placed back in a supine position.  She was extubated in the operating room and taken to the postanesthetic care unit in good condition.  All sponge, needle and instrument counts were correct at the end of the procedure.  Experienced assistance was necessary for this case due to surgical complexity.  Aloha Gell, PA served as Museum/gallery curator.  She provided assistance with port placement, camera management, robot docking and undocking, instrument exchange, specimen  retrieval, suctioning, and wound closure.   PUS D: 05/03/2023 4:09:46 pm T: 05/03/2023 5:43:00 pm  JOB: [id number]/ [id number]

## 2023-05-03 NOTE — Anesthesia Procedure Notes (Signed)
Arterial Line Insertion Start/End8/30/2024 7:15 AM, 05/03/2023 7:20 AM Performed by: Randon Goldsmith, CRNA, CRNA  Patient location: Pre-op. Preanesthetic checklist: patient identified, IV checked, site marked, risks and benefits discussed, surgical consent, monitors and equipment checked, pre-op evaluation, timeout performed and anesthesia consent Lidocaine 1% used for infiltration Right, radial was placed Catheter size: 20 G Hand hygiene performed , maximum sterile barriers used  and Seldinger technique used Allen's test indicative of satisfactory collateral circulation Attempts: 2 Procedure performed without using ultrasound guided technique. Following insertion, dressing applied. Post procedure assessment: normal and unchanged  Patient tolerated the procedure well with no immediate complications.

## 2023-05-03 NOTE — Discharge Summary (Addendum)
Physician Discharge Summary  Patient ID: Christina Jenkins MRN: [id number] DOB/AGE: Aug 07, 1961 62 y.o.  Admit date: 05/03/2023 Discharge date: 05/08/2023  Admission Diagnoses:  Adenocarcinoma of the lung clinical stage Ia (T1, N0)  Discharge Diagnoses:  Adenocarcinoma of the lung-clinical and pathologic stage Ia (T1b, N0) Principal Problem:   S/P partial lobectomy of lung   Discharged Condition: good  HPI:  Mrs. Jenkins sent for consultation regarding an adenocarcinoma of the left upper lobe.   Christina Jenkins is a 62 year old woman with a history of tobacco abuse, COPD, hepatitis C, hyperlipidemia, type 2 diabetes, hypokalemia, depression, and rheumatoid arthritis.  She has smoked 2 packs a day for 40 years.  Currently smoking about a pack a day.  She had a low-dose CT for lung cancer screening in June.  It showed a left upper lobe nodule.  Super D CT in July showed a mixed density nodule in the left upper lobe measuring about 17 mm with a 9 mm solid component.  She saw Dr. Sherene Sires and was referred for navigational bronchoscopy.  Dr. Tonia Brooms performed robotic bronchoscopy on 04/02/2023.  Biopsy showed adenocarcinoma.   She is still smoking and is down to about a pack a day.  She denies chest pain, pressure, or tightness.  She gets short of breath with heavy exertion but can walk up a flight of stairs without stopping.  No cough or hemoptysis.  She has lost about 11 pounds over the past 3 months and about 20 pounds going back 6 months.  She attributes that to metformin.  Her appetite is good.  No unusual headaches or visual changes.  Disabled secondary to rheumatoid arthritis.  Dr. Dorris Fetch reviewed the patient's diagnostic studies and determined she would benefit from surgical intervention. He reviewed the treatment options as well as the risks and benefits of surgery with the patient. [name] was agreeable to proceed with surgery.  Hospital Course: Christina Jenkins arrived at [facility] and  was brought to the operating room on 05/03/23. She underwent robotic assisted thoracoscopy with left lingula sparing lobectomy. She tolerated the procedure well, was extubated and transferred to the PACU in stable condition.   The patient had a large air leak on POD #1.  Her CXR showed a pneumothorax.  She was transitioned to suction for 24 hours.  Her IV fluids were discontinued and she was resumed on her home regimen of Lasix.  She had coarse breath sounds and nebulizer treatments were started.  The patient's CXR showed an improvement in her pneumothorax.  She now had a small space.  The patient's atelectasis persisted.  She was treated with a dose of Mucomyst.  Her chest tube was placed back to water seal on 05/06/2023.  Her air leak resolved and chest tube was removed on 9/3.  Her CXR showed increasing opacities despite aggressive pulmonary toilet.  This did improve with time.  It did appear that she had a developing bronchitis she will be started on Augmentin at discharge.  Oxygen saturations with ambulation decreased below 90% and is felt she would require home oxygen for at least the short-term and these arrangements have been made.  Incisions are healing well without evidence of infection.  She is tolerating diet.  Overall, at the time of discharge she is felt to be stable.  Consults: None  Significant Diagnostic Studies: CLINICAL DATA:  Follow-up lung nodule   EXAM: CT CHEST WITHOUT CONTRAST   TECHNIQUE: Multidetector CT imaging of the chest was performed using thin  slice collimation for electromagnetic bronchoscopy planning purposes, without intravenous contrast.   RADIATION DOSE REDUCTION: This exam was performed according to the departmental dose-optimization program which includes automated exposure control, adjustment of the mA and/or kV according to patient size and/or use of iterative reconstruction technique.   COMPARISON:  Lung cancer screening CT dated February 11, 2023    FINDINGS: Cardiovascular: Normal heart size. No pericardial effusion. Normal caliber thoracic aorta with mild calcified plaque. Moderate coronary artery calcifications.   Mediastinum/Nodes: Esophagus and thyroid are unremarkable. No enlarged lymph nodes seen in the chest.   Lungs/Pleura: Central airways are patent. Mild paraseptal emphysema. Numerous calcified nodules seen bilaterally. Part solid nodule of the left upper lobe is unchanged in size when compared with the prior exam with overall mean diameter of 16.9 mm and solid component measuring 9.1 mm.   Upper Abdomen: No acute abnormality.   Musculoskeletal: No chest wall mass or suspicious bone lesions identified.   IMPRESSION: 1. Part solid nodule of the left upper lobe is unchanged in size when compared with the prior exam and concerning for primary lung adenocarcinoma. 2. Coronary artery calcifications, aortic Atherosclerosis (ICD10-I70.0) and Emphysema (ICD10-J43.9).     Electronically Signed   By: Allegra Lai M.D.   On: 04/01/2023 08:17  Clinical History: Lung nodule     FINAL MICROSCOPIC DIAGNOSIS:  A. LUNG, LUL, FINE NEEDLE ASPIRATION:  - Non-small cell carcinoma, morphologically favor adenocarcinoma.   Note: There is limited material on the cellblock with extensive crush  artifact, and while there are cells staining for TTF-1, Napsin A and CK7  with negative staining for p40, CK5/6 and CD56 favoring the center  morphologic impression of adenocarcinoma, given the overall morphology  in the cellblock the findings are not pathognomonic.   B. LUNG, LUL, BRUSHING:  - Atypical cells suspicious for malignancy, see above   Dr. Venetia Night has peer reviewed the case and agrees with the  interpretation.    Treatments: surgery:    Operative Report    DATE OF PROCEDURE: 05/03/2023   PREOPERATIVE DIAGNOSIS:  Adenocarcinoma, left upper lobe, clinical stage IA (T1, N0).   POSTOPERATIVE DIAGNOSIS:   Adenocarcinoma, left upper lobe, clinical stage IA (T1, N0).   PROCEDURE:  Robotic left VATS, lingular sparing left upper lobectomy, lymph node dissection and intercostal nerve blocks levels 4 through 10.   SURGEON:  Salvatore Decent. Dorris Fetch, MD   ASSISTANT:  Aloha Gell.   ANESTHESIA:  General. PATHOLOGY:  Discharge Exam: Blood pressure 95/73, pulse 99, temperature 98.3 F (36.8 C), temperature source Oral, resp. rate 15, height 5\' 6"  (1.676 m), weight 89.6 kg, SpO2 94%.  General appearance: alert, cooperative, and no distress Heart: regular rate and rhythm Lungs: some scattered ronchi, coarseness, improves with cough Abdomen: benign Extremities: no edema or calf tenderness Wound: incis healing well   Disposition: Discharge disposition: 01-Home or Self Care       Discharge Instructions     Discharge patient   Complete by: As directed    When home O2 arrangements are in place   Discharge disposition: 01-Home or Self Care   Discharge patient date: 05/08/2023      Allergies as of 05/08/2023   No Known Allergies      Medication List     STOP taking these medications    ciprofloxacin 500 MG tablet Commonly known as: Cipro   Humira (2 Pen) 40 MG/0.4ML pen Generic drug: adalimumab   leflunomide 20 MG tablet Commonly known as: ARAVA   predniSONE  5 MG tablet Commonly known as: DELTASONE       TAKE these medications    amoxicillin-clavulanate 875-125 MG tablet Commonly known as: AUGMENTIN Take 1 tablet by mouth every 12 (twelve) hours.   cetirizine 10 MG tablet Commonly known as: ZYRTEC Take 10 mg by mouth at bedtime.   DULoxetine 60 MG capsule Commonly known as: CYMBALTA Take 120 mg by mouth at bedtime.   furosemide 20 MG tablet Commonly known as: LASIX Take 40 mg by mouth in the morning.   guaiFENesin 600 MG 12 hr tablet Commonly known as: MUCINEX Take 2 tablets (1,200 mg total) by mouth 2 (two) times daily.   ibuprofen 200 MG  tablet Commonly known as: ADVIL Take 400 mg by mouth every 8 (eight) hours as needed for moderate pain.   levETIRAcetam 500 MG 24 hr tablet Commonly known as: Keppra XR Take 1 tablet (500 mg total) by mouth daily. What changed: when to take this   lisinopril 10 MG tablet Commonly known as: ZESTRIL Take 10 mg by mouth at bedtime.   metFORMIN 500 MG 24 hr tablet Commonly known as: GLUCOPHAGE-XR Take 1,000 mg by mouth daily after supper.   metoprolol succinate 25 MG 24 hr tablet Commonly known as: TOPROL-XL Take 1 tablet (25 mg total) by mouth daily.   multivitamin tablet Take 1 tablet by mouth at bedtime.   oxyCODONE 5 MG immediate release tablet Commonly known as: Oxy IR/ROXICODONE Take 1 tablet (5 mg total) by mouth every 6 (six) hours as needed for up to 7 days for moderate pain.   Potassium Chloride ER 20 MEQ Tbcr Take 20 mEq by mouth at bedtime.   rosuvastatin 20 MG tablet Commonly known as: CRESTOR Take 20 mg by mouth at bedtime.               Durable Medical Equipment  (From admission, onward)           Start     Ordered   05/08/23 0930  For home use only DME oxygen  Once       Question Answer Comment  Length of Need 6 Months   Mode or (Route) Nasal cannula   Liters per Minute 2   Oxygen delivery system Gas      05/08/23 0929            Follow-up Information     [name], MD Follow up on 05/22/2023.   Specialty: Cardiothoracic Surgery Why: Follow up appointment is at 3:45PM Contact information: [address] [employer] [address] [place] [address] [phone]         [facility] Follow up on 05/22/2023.   Why: To get chest xray at 2:45PM Contact information: [address] [place] [address]                Signed: Rowe Clack, Cordelia Poche  05/08/2023, 10:41 AM

## 2023-05-03 NOTE — Interval H&P Note (Signed)
History and Physical Interval Note: PET c/w stage IA, adenopathy, distant mets  05/03/2023 7:16 AM  Christina Jenkins  has presented today for surgery, with the diagnosis of adenocarcinoma of left upper lobe.  The various methods of treatment have been discussed with the patient and family. After consideration of risks, benefits and other options for treatment, the patient has consented to  Procedure(s): XI ROBOTIC ASSISTED THORACOSCOPY-LEFT UPPER LOBECTOMY OR SEGMENTECTOMY (Left) as a surgical intervention.  The patient's history has been reviewed, patient examined, no change in status, stable for surgery.  I have reviewed the patient's chart and labs.  Questions were answered to the patient's satisfaction.     [name]

## 2023-05-03 NOTE — Progress Notes (Signed)
Patient arrived from surgery with left eye drainage and irritation. Spoke to Dr. Mal Amabile with anesthesia and got orders for eye drops to help with corneal abrasion. Will continue to monitor patient.

## 2023-05-04 ENCOUNTER — Inpatient Hospital Stay (HOSPITAL_COMMUNITY): Payer: Medicare Other

## 2023-05-04 ENCOUNTER — Encounter (HOSPITAL_COMMUNITY): Payer: Self-pay | Admitting: Thoracic Surgery (Cardiothoracic Vascular Surgery)

## 2023-05-04 LAB — BASIC METABOLIC PANEL
Anion gap: 9 (ref 5–15)
BUN: 7 mg/dL — ABNORMAL LOW (ref 8–23)
CO2: 25 mmol/L (ref 22–32)
Calcium: 8.4 mg/dL — ABNORMAL LOW (ref 8.9–10.3)
Chloride: 103 mmol/L (ref 98–111)
Creatinine, Ser: 0.77 mg/dL (ref 0.44–1.00)
GFR, Estimated: >60 mL/min (ref >60)
Glucose, Bld: 111 mg/dL — ABNORMAL HIGH (ref 70–99)
Potassium: 3.6 mmol/L (ref 3.5–5.1)
Sodium: 137 mmol/L (ref 135–145)

## 2023-05-04 LAB — CBC
HCT: 36.2 % (ref 36.0–46.0)
Hemoglobin: 11.8 g/dL — ABNORMAL LOW (ref 12.0–15.0)
MCH: 30.4 pg (ref 26.0–34.0)
MCHC: 32.6 g/dL (ref 30.0–36.0)
MCV: 93.3 fL (ref 80.0–100.0)
Platelets: 139 10*3/uL — ABNORMAL LOW (ref 150–400)
RBC: 3.88 MIL/uL (ref 3.87–5.11)
RDW: 11.9 % (ref 11.5–15.5)
WBC: 14 10*3/uL — ABNORMAL HIGH (ref 4.0–10.5)
nRBC: 0 % (ref 0.0–0.2)

## 2023-05-04 LAB — GLUCOSE, CAPILLARY
Glucose-Capillary: 98 mg/dL (ref 70–99)
Glucose-Capillary: 134 mg/dL — ABNORMAL HIGH (ref 70–99)
Glucose-Capillary: 159 mg/dL — ABNORMAL HIGH (ref 70–99)
Glucose-Capillary: 133 mg/dL — ABNORMAL HIGH (ref 70–99)

## 2023-05-04 MED ORDER — FUROSEMIDE 10 MG/ML IJ SOLN
40.0000 mg | Freq: Once | INTRAMUSCULAR | Status: AC
  Administered 2023-05-04: 40 mg via INTRAVENOUS
  Filled 2023-05-04: qty 4

## 2023-05-04 MED ORDER — POTASSIUM CHLORIDE CRYS ER 20 MEQ PO TBCR
20.0000 meq | EXTENDED_RELEASE_TABLET | Freq: Every day | ORAL | Status: DC
  Administered 2023-05-04 – 2023-05-08 (×5): 20 meq via ORAL
  Filled 2023-05-04 (×5): qty 1

## 2023-05-04 MED ORDER — CHLORHEXIDINE GLUCONATE CLOTH 2 % EX PADS
6.0000 | MEDICATED_PAD | Freq: Once | CUTANEOUS | Status: AC
  Administered 2023-05-04: 6 via TOPICAL

## 2023-05-04 MED ORDER — CHLORHEXIDINE GLUCONATE CLOTH 2 % EX PADS: 6.0000 | MEDICATED_PAD | Freq: Every day | CUTANEOUS | Status: DC

## 2023-05-04 MED ORDER — ALBUTEROL SULFATE (2.5 MG/3ML) 0.083% IN NEBU
2.5000 mg | INHALATION_SOLUTION | Freq: Four times a day (QID) | RESPIRATORY_TRACT | Status: DC
  Administered 2023-05-04 – 2023-05-05 (×5): 2.5 mg via RESPIRATORY_TRACT
  Filled 2023-05-04 (×5): qty 3

## 2023-05-04 MED ORDER — FUROSEMIDE 40 MG PO TABS
40.0000 mg | ORAL_TABLET | Freq: Every day | ORAL | Status: DC
  Administered 2023-05-05 – 2023-05-08 (×4): 40 mg via ORAL
  Filled 2023-05-04 (×4): qty 1

## 2023-05-04 MED ORDER — INSULIN ASPART 100 UNIT/ML IJ SOLN
0.0000 [IU] | Freq: Three times a day (TID) | INTRAMUSCULAR | Status: DC
  Administered 2023-05-04 – 2023-05-06 (×5): 2 [IU] via SUBCUTANEOUS

## 2023-05-04 NOTE — Progress Notes (Addendum)
      [address]       [name] [address]             [phone]      1 Day Post-Op Procedure(s) (LRB): XI ROBOTIC ASSISTED THORACOSCOPY-LEFT UPPER LOBECTOMY (Left) NODE DISSECTION (Left) INTERCOSTAL NERVE BLOCK (Left)  Subjective:  Patient doing okay.  Denies N/V.  Does ask about her medications for RA.  Objective: Vital signs in last 24 hours: Temp:  [97.7 F (36.5 C)-98.1 F (36.7 C)] 98.1 F (36.7 C) (08/31 0400) Pulse Rate:  [83-101] 90 (08/31 0400) Cardiac Rhythm: Normal sinus rhythm (08/31 0700) Resp:  [11-18] 15 (08/31 0400) BP: (102-129)/(45-66) 102/55 (08/31 0400) SpO2:  [91 %-98 %] 91 % (08/31 0400) Arterial Line BP: (115-143)/(38-54) 115/38 (08/30 1224) Weight:  [89.6 kg] 89.6 kg (08/30 1300)  Intake/Output from previous day: 08/30 0701 - 08/31 0700 In: 4081.5 [I.V.:3781.5; IV Piggyback:300] Out: 2485 [Urine:2400; Blood:30; Chest Tube:55]  General appearance: alert, cooperative, and no distress Heart: regular rate and rhythm Lungs: coarse throughout Abdomen: soft, non-tender; bowel sounds normal; no masses,  no organomegaly Extremities: extremities normal, atraumatic, no cyanosis or edema Wound: clean and dry  Lab Results: Recent Labs    05/01/23 1000 05/04/23 0239  WBC 7.9 14.0*  HGB 13.8 11.8*  HCT 41.9 36.2  PLT 211 139*   BMET:  Recent Labs    05/01/23 1000 05/04/23 0239  NA 137 137  K 3.7 3.6  CL 102 103  CO2 21* 25  GLUCOSE 86 111*  BUN 11 7*  CREATININE 0.78 0.77  CALCIUM 8.8* 8.4*    PT/INR:  Recent Labs    05/01/23 1000  LABPROT 12.4  INR 0.9   ABG    Component Value Date/Time   PHART 7.41 05/03/2023 0720   HCO3 27.9 05/03/2023 0720   O2SAT 93.8 05/03/2023 0720   CBG (last 3)  Recent Labs    05/03/23 1724 05/03/23 2141 05/04/23 0615  GLUCAP 119* 162* 98    Assessment/Plan: S/P Procedure(s) (LRB): XI ROBOTIC ASSISTED THORACOSCOPY-LEFT UPPER LOBECTOMY (Left) NODE DISSECTION  (Left) INTERCOSTAL NERVE BLOCK (Left)  CV- NSR, BP low this morning, will hold Lisinopril for now Pulm- CT on water seal, + air leak.. CXR with pneumothorax, +atelectasis on left may benefit from placement of chest tube to suction for 24 hours, will discuss with Dr. Laneta Simmers.. add nebs, continue IS Renal- creatinine and lytes are okay.. foley catheter out.. will resume patient's home lasix regimen RA- patient asking about resumption of medications, per Dr. Rondel Jumbo instruction these are not to be resumed  Lovenox for DVT prophylaxis DM- change SSIP to TID/HS.Marland Kitchen will resume Metformin likely tomorrow    LOS: 1 day    Lowella Dandy, PA-C 05/04/2023   As discussed with Dr. Laneta Simmers will place patient's chest tube on suction for 24 hours.  Lowella Dandy, PA-C 8:43 AM   Chart reviewed, patient examined, agree with above. She feels well. CXR this am shows slightly increased left ptx and significant ASD/atelectasis of LLL. Tube put to suction. There is currently a 1 chamber air leak with breathing and occasionally it will go over to 4-5 chambers. I can auscultate an air leak with breathing. Will keep to suction today. Work on IS. She walked this am.

## 2023-05-04 NOTE — Plan of Care (Signed)
  Problem: Activity: Goal: Risk for activity intolerance will decrease Outcome: Progressing   Problem: Nutrition: Goal: Adequate nutrition will be maintained Outcome: Progressing   Problem: Elimination: Goal: Will not experience complications related to bowel motility Outcome: Progressing Goal: Will not experience complications related to urinary retention Outcome: Progressing   Problem: Safety: Goal: Ability to remain free from injury will improve Outcome: Progressing

## 2023-05-05 ENCOUNTER — Inpatient Hospital Stay (HOSPITAL_COMMUNITY): Payer: Medicare Other

## 2023-05-05 LAB — GLUCOSE, CAPILLARY
Glucose-Capillary: 135 mg/dL — ABNORMAL HIGH (ref 70–99)
Glucose-Capillary: 125 mg/dL — ABNORMAL HIGH (ref 70–99)
Glucose-Capillary: 123 mg/dL — ABNORMAL HIGH (ref 70–99)
Glucose-Capillary: 129 mg/dL — ABNORMAL HIGH (ref 70–99)

## 2023-05-05 LAB — COMPREHENSIVE METABOLIC PANEL
ALT: 14 U/L (ref 0–44)
AST: 19 U/L (ref 15–41)
Albumin: 2.4 g/dL — ABNORMAL LOW (ref 3.5–5.0)
Alkaline Phosphatase: 67 U/L (ref 38–126)
Anion gap: 7 (ref 5–15)
BUN: 9 mg/dL (ref 8–23)
CO2: 25 mmol/L (ref 22–32)
Calcium: 8.3 mg/dL — ABNORMAL LOW (ref 8.9–10.3)
Chloride: 102 mmol/L (ref 98–111)
Creatinine, Ser: 0.93 mg/dL (ref 0.44–1.00)
GFR, Estimated: >60 mL/min (ref >60)
Glucose, Bld: 133 mg/dL — ABNORMAL HIGH (ref 70–99)
Potassium: 3.4 mmol/L — ABNORMAL LOW (ref 3.5–5.1)
Sodium: 134 mmol/L — ABNORMAL LOW (ref 135–145)
Total Bilirubin: 0.6 mg/dL (ref 0.3–1.2)
Total Protein: 5.5 g/dL — ABNORMAL LOW (ref 6.5–8.1)

## 2023-05-05 LAB — CBC
HCT: 35.1 % — ABNORMAL LOW (ref 36.0–46.0)
Hemoglobin: 11.7 g/dL — ABNORMAL LOW (ref 12.0–15.0)
MCH: 30.5 pg (ref 26.0–34.0)
MCHC: 33.3 g/dL (ref 30.0–36.0)
MCV: 91.4 fL (ref 80.0–100.0)
Platelets: 156 10*3/uL (ref 150–400)
RBC: 3.84 MIL/uL — ABNORMAL LOW (ref 3.87–5.11)
RDW: 11.9 % (ref 11.5–15.5)
WBC: 15.2 10*3/uL — ABNORMAL HIGH (ref 4.0–10.5)
nRBC: 0 % (ref 0.0–0.2)

## 2023-05-05 MED ORDER — POTASSIUM CHLORIDE CRYS ER 20 MEQ PO TBCR
20.0000 meq | EXTENDED_RELEASE_TABLET | Freq: Once | ORAL | Status: AC
  Administered 2023-05-05: 20 meq via ORAL
  Filled 2023-05-05: qty 1

## 2023-05-05 MED ORDER — GUAIFENESIN ER 600 MG PO TB12
600.0000 mg | ORAL_TABLET | Freq: Two times a day (BID) | ORAL | Status: DC
  Administered 2023-05-05 – 2023-05-06 (×3): 600 mg via ORAL
  Filled 2023-05-05 (×3): qty 1

## 2023-05-05 MED ORDER — ALBUTEROL SULFATE (2.5 MG/3ML) 0.083% IN NEBU: 2.5000 mg | INHALATION_SOLUTION | Freq: Four times a day (QID) | RESPIRATORY_TRACT | Status: DC | PRN

## 2023-05-05 MED ORDER — ACETYLCYSTEINE 20 % IN SOLN
3.0000 mL | Freq: Once | RESPIRATORY_TRACT | Status: AC
  Administered 2023-05-05: 3 mL via RESPIRATORY_TRACT
  Filled 2023-05-05: qty 4

## 2023-05-05 MED ORDER — LISINOPRIL 10 MG PO TABS
10.0000 mg | ORAL_TABLET | Freq: Every day | ORAL | Status: DC
  Administered 2023-05-05 – 2023-05-08 (×4): 10 mg via ORAL
  Filled 2023-05-05 (×4): qty 1

## 2023-05-05 NOTE — Progress Notes (Signed)
Mobility Specialist Progress Note    05/05/23 1518  Mobility  Activity Ambulated with assistance in hallway  Level of Assistance Contact guard assist, steadying assist  Assistive Device Front wheel walker  Distance Ambulated (ft) 340 ft  Activity Response Tolerated well  Mobility Referral Yes  $Mobility charge 1 Mobility  Mobility Specialist Start Time (ACUTE ONLY) 1506  Mobility Specialist Stop Time (ACUTE ONLY) 1518  Mobility Specialist Time Calculation (min) (ACUTE ONLY) 12 min   Pt received sitting EOB and agreeable. No complaints on walk. Returned to bed with call bell in reach. CT back to suction.  [place] Mobility Specialist  Please Neurosurgeon or Rehab Office at [phone]

## 2023-05-05 NOTE — TOC Initial Note (Signed)
Transition of Care [facility]) - Initial/Assessment Note    Patient Details  Name: Christina Jenkins MRN: [id number] Date of Birth: 1960/09/13  Transition of Care [facility] CM/SW Contact:    Ronny Bacon, RN Phone Number: 05/05/2023, 2:50 PM  Clinical Narrative:   Spoke with patient at bedside. Patient lives at home with spouse. Patient does not use home O2 and does not have DME. Spouse will transport patient home at discharge. TOC will continue to follow for TOC needs.                Expected Discharge Plan: Home/Self Care Barriers to Discharge: Continued Medical Work up   Patient Goals and CMS Choice Patient states their goals for this hospitalization and ongoing recovery are:: to go home and return to baseline          Expected Discharge Plan and Services       Living arrangements for the past 2 months: Single Family Home                                      Prior Living Arrangements/Services Living arrangements for the past 2 months: Single Family Home Lives with:: Spouse Patient language and need for interpreter reviewed:: Yes Do you feel safe going back to the place where you live?: Yes      Need for Family Participation in Patient Care: No (Comment) Care giver support system in place?: Yes (comment)   Criminal Activity/Legal Involvement Pertinent to Current Situation/Hospitalization: No - Comment as needed  Activities of Daily Living Home Assistive Devices/Equipment: CBG Meter, Dentures (specify type), Eyeglasses (upper and lower dentures) ADL Screening (condition at time of admission) Patient's cognitive ability adequate to safely complete daily activities?: Yes Is the patient deaf or have difficulty hearing?: No Does the patient have difficulty seeing, even when wearing glasses/contacts?: No Does the patient have difficulty concentrating, remembering, or making decisions?: No Patient able to express need for assistance with ADLs?: Yes Does the patient have  difficulty dressing or bathing?: No Independently performs ADLs?: Yes (appropriate for developmental age) Does the patient have difficulty walking or climbing stairs?: No Weakness of Legs: None Weakness of Arms/Hands: None  Permission Sought/Granted                  Emotional Assessment Appearance:: Appears stated age Attitude/Demeanor/Rapport: Engaged Affect (typically observed): Appropriate Orientation: : Oriented to Self, Oriented to Place, Oriented to  Time, Oriented to Situation Alcohol / Substance Use: Not Applicable Psych Involvement: No (comment)  Admission diagnosis:  S/P partial lobectomy of lung [Z90.2] Patient Active Problem List   Diagnosis Date Noted   S/P partial lobectomy of lung 05/03/2023   Solitary pulmonary nodule on lung CT 03/02/2023   COPD GOLD ? / group A 03/02/2023   Lung nodule 03/02/2023   Chest pain of uncertain etiology 04/30/2019   Abnormal nuclear stress test    Circulatory system disorder 04/18/2019   Ischemia 04/18/2019   Hyperglycemia, unspecified 12/30/2018   Other abnormal glucose 12/30/2018   History of COPD 05/31/2017   Mixed hyperlipidemia 04/30/2017   Fatigue 12/04/2016   History of seizures 10/23/2016   History of hepatitis C 10/23/2016   ANA positive 10/23/2016   Sicca syndrome, unspecified 10/23/2016   Venous (peripheral) insufficiency 10/08/2016   Varicose veins of lower extremities with inflammation 10/08/2016   Seropositive rheumatoid arthritis (HCC) 08/01/2016   High risk medication use 08/01/2016  Hepatitis C 08/01/2016   Cigarette smoker 08/01/2016   Depression 08/01/2016   Complex partial seizure (HCC) 02/02/2016   MCI (mild cognitive impairment) 11/02/2015   Arthralgia 11/02/2015   Carpal tunnel syndrome 10/20/2015   Syncope 05/17/2015   Convulsions/seizures (HCC) 05/17/2015   Tobacco use disorder 02/23/2014   Edema 09/22/2013   Osteoarthrosis 06/11/2011   Hypertension 12/20/2010   Hypokalemia 12/20/2010    PCP:  Encarnacion Slates, PA-C Pharmacy:   [facility] [phone] - [place], [place] - [address] AT [facility] OF [address] [place] [address] Phone: [phone] Fax: [phone]  [facility] [phone] - [place], [place] - [address] AT [facility] OF [address] [place] [address] Phone: [phone] Fax: [phone]  [facility] - [place], [place] - [id number], [address], [address] [phone], [address], [address] [facility] [place] [address] Phone: [phone] Fax: [phone]     Social Determinants of Health (SDOH) Social History: SDOH Screenings   Food Insecurity: No Food Insecurity (05/04/2023)  Housing: Patient Declined (05/04/2023)  Transportation Needs: No Transportation Needs (05/04/2023)  Utilities: Not At Risk (05/04/2023)  Tobacco Use: High Risk (05/03/2023)   SDOH Interventions:     Readmission Risk Interventions     No data to display

## 2023-05-05 NOTE — Progress Notes (Addendum)
      [address]       [name] [address]             [phone]      2 Days Post-Op Procedure(s) (LRB): XI ROBOTIC ASSISTED THORACOSCOPY-LEFT UPPER LOBECTOMY (Left) NODE DISSECTION (Left) INTERCOSTAL NERVE BLOCK (Left)  Subjective:  Patient doing well.  Pain is well controlled... Ambulated around hallway TID  Objective: Vital signs in last 24 hours: Temp:  [98.5 F (36.9 C)-99.9 F (37.7 C)] 98.6 F (37 C) (09/01 0707) Pulse Rate:  [102-124] 117 (09/01 0028) Cardiac Rhythm: Sinus tachycardia;Bundle branch block (09/01 0700) Resp:  [19-34] 19 (09/01 0028) BP: (114-148)/(58-76) 114/60 (09/01 0707) SpO2:  [92 %-94 %] 93 % (09/01 0028)  Intake/Output from previous day: 08/31 0701 - 09/01 0700 In: -  Out: 1525 [Urine:700; Chest Tube:825]  General appearance: alert, cooperative, and no distress Heart: regular rate and rhythm Lungs: coarse throughout Abdomen: soft, non-tender; bowel sounds normal; no masses,  no organomegaly Extremities: edema trace Wound: clean and dry  Lab Results: Recent Labs    05/04/23 0239 05/05/23 0711  WBC 14.0* 15.2*  HGB 11.8* 11.7*  HCT 36.2 35.1*  PLT 139* 156   BMET:  Recent Labs    05/04/23 0239 05/05/23 0711  NA 137 134*  K 3.6 3.4*  CL 103 102  CO2 25 25  GLUCOSE 111* 133*  BUN 7* 9  CREATININE 0.77 0.93  CALCIUM 8.4* 8.3*    PT/INR: No results for input(s): "LABPROT", "INR" in the last 72 hours. ABG    Component Value Date/Time   PHART 7.41 05/03/2023 0720   HCO3 27.9 05/03/2023 0720   O2SAT 93.8 05/03/2023 0720   CBG (last 3)  Recent Labs    05/04/23 1637 05/04/23 2136 05/05/23 0647  GLUCAP 159* 133* 135*    Assessment/Plan: S/P Procedure(s) (LRB): XI ROBOTIC ASSISTED THORACOSCOPY-LEFT UPPER LOBECTOMY (Left) NODE DISSECTION (Left) INTERCOSTAL NERVE BLOCK (Left)  CV- NSR, HTN- will resume Lisinopril at home dose Pulm- CT on suction, 800 cc output... small air leak.. leave on  suction today.Marland Kitchen CXR with improvement of pneumothorax, now small space.. .will add Mucinex and dose of Mucomyst for atelectasis Renal- creatinine remains stable, mild edema on exam, continue home Lasix Lovenox for DVT prophylaxis DM- sugars stable, will continue SSIP.Marland Kitchen if creatinine remains stable will resume home Metformin tomorrow   LOS: 2 days    Lowella Dandy, PA-C 05/05/2023   Chart reviewed, patient examined, agree with above. She feels well and is walking with assistance. CXR this am shows small residual ptx or space on the left with increased atelectasis of left lung. There is a small intermittent air leak with cough, improved from yesterday. Will continue CT to suction today. Work on IS, ambulate.

## 2023-05-06 ENCOUNTER — Inpatient Hospital Stay (HOSPITAL_COMMUNITY): Payer: Medicare Other

## 2023-05-06 LAB — CBC
HCT: 35.8 % — ABNORMAL LOW (ref 36.0–46.0)
Hemoglobin: 11.6 g/dL — ABNORMAL LOW (ref 12.0–15.0)
MCH: 30.1 pg (ref 26.0–34.0)
MCHC: 32.4 g/dL (ref 30.0–36.0)
MCV: 92.7 fL (ref 80.0–100.0)
Platelets: 146 10*3/uL — ABNORMAL LOW (ref 150–400)
RBC: 3.86 MIL/uL — ABNORMAL LOW (ref 3.87–5.11)
RDW: 12 % (ref 11.5–15.5)
WBC: 12.2 10*3/uL — ABNORMAL HIGH (ref 4.0–10.5)
nRBC: 0 % (ref 0.0–0.2)

## 2023-05-06 LAB — GLUCOSE, CAPILLARY
Glucose-Capillary: 145 mg/dL — ABNORMAL HIGH (ref 70–99)
Glucose-Capillary: 108 mg/dL — ABNORMAL HIGH (ref 70–99)
Glucose-Capillary: 108 mg/dL — ABNORMAL HIGH (ref 70–99)
Glucose-Capillary: 128 mg/dL — ABNORMAL HIGH (ref 70–99)

## 2023-05-06 LAB — BASIC METABOLIC PANEL
Anion gap: 7 (ref 5–15)
BUN: 10 mg/dL (ref 8–23)
CO2: 26 mmol/L (ref 22–32)
Calcium: 8.2 mg/dL — ABNORMAL LOW (ref 8.9–10.3)
Chloride: 103 mmol/L (ref 98–111)
Creatinine, Ser: 0.71 mg/dL (ref 0.44–1.00)
GFR, Estimated: >60 mL/min (ref >60)
Glucose, Bld: 119 mg/dL — ABNORMAL HIGH (ref 70–99)
Potassium: 3.5 mmol/L (ref 3.5–5.1)
Sodium: 136 mmol/L (ref 135–145)

## 2023-05-06 MED ORDER — METFORMIN HCL ER 500 MG PO TB24
1000.0000 mg | ORAL_TABLET | Freq: Every evening | ORAL | Status: DC
  Administered 2023-05-06 – 2023-05-07 (×2): 1000 mg via ORAL
  Filled 2023-05-06 (×3): qty 2

## 2023-05-06 MED ORDER — GUAIFENESIN ER 600 MG PO TB12
1200.0000 mg | ORAL_TABLET | Freq: Two times a day (BID) | ORAL | Status: DC
  Administered 2023-05-06 – 2023-05-08 (×4): 1200 mg via ORAL
  Filled 2023-05-06 (×4): qty 2

## 2023-05-06 MED ORDER — ORAL CARE MOUTH RINSE: 15.0000 mL | OROMUCOSAL | Status: DC | PRN

## 2023-05-06 NOTE — Progress Notes (Signed)
Mobility Specialist Progress Note:   05/06/23 1300  Mobility  Activity Ambulated with assistance in hallway  Level of Assistance Contact guard assist, steadying assist  Assistive Device Front wheel walker  Distance Ambulated (ft) 340 ft  Activity Response Tolerated well  Mobility Referral Yes  $Mobility charge 1 Mobility  Mobility Specialist Start Time (ACUTE ONLY) 1327  Mobility Specialist Stop Time (ACUTE ONLY) 1345  Mobility Specialist Time Calculation (min) (ACUTE ONLY) 18 min    Pre Mobility: 112 HR,  94% SpO2 During Mobility: 130 HR,  90% SpO2 Post Mobility:  118 HR,  92% SpO2  Pt received in bed, agreeable to mobility. Asymptomatic throughout w/ no complaints. Pt left in bed with call bell and family present.  [place] Earlene Plater Mobility Specialist Please contact via Special educational needs teacher or Rehab office at [phone]

## 2023-05-06 NOTE — Progress Notes (Addendum)
      [address]       [place] [address]             [phone]      3 Days Post-Op Procedure(s) (LRB): XI ROBOTIC ASSISTED THORACOSCOPY-LEFT UPPER LOBECTOMY (Left) NODE DISSECTION (Left) INTERCOSTAL NERVE BLOCK (Left)  Subjective:  Patient doing well.  She has ambulated several times around the unit.  Continues to have difficulty coughing up sputum.  Objective: Vital signs in last 24 hours: Temp:  [97.7 F (36.5 C)-98.7 F (37.1 C)] 98.3 F (36.8 C) (09/02 0724) Pulse Rate:  [102-122] 122 (09/02 0724) Cardiac Rhythm: Sinus tachycardia (09/02 0745) Resp:  [18-21] 21 (09/02 0724) BP: (100-124)/(46-75) 112/63 (09/02 0724) SpO2:  [91 %-96 %] 91 % (09/02 0724)  Intake/Output from previous day: 09/01 0701 - 09/02 0700 In: 240 [P.O.:240] Out: 1850 [Urine:1500; Chest Tube:350] Intake/Output this shift: Total I/O In: 240 [P.O.:240] Out: 10 [Chest Tube:10]  General appearance: alert, cooperative, and no distress Heart: regular rate and rhythm Lungs: diminished breath sounds bibasilar Abdomen: soft, non-tender; bowel sounds normal; no masses,  no organomegaly Extremities: extremities normal, atraumatic, no cyanosis or edema Wound: clean and dry  Lab Results: Recent Labs    05/05/23 0711 05/06/23 0159  WBC 15.2* 12.2*  HGB 11.7* 11.6*  HCT 35.1* 35.8*  PLT 156 146*   BMET:  Recent Labs    05/05/23 0711 05/06/23 0159  NA 134* 136  K 3.4* 3.5  CL 102 103  CO2 25 26  GLUCOSE 133* 119*  BUN 9 10  CREATININE 0.93 0.71  CALCIUM 8.3* 8.2*    PT/INR: No results for input(s): "LABPROT", "INR" in the last 72 hours. ABG    Component Value Date/Time   PHART 7.41 05/03/2023 0720   HCO3 27.9 05/03/2023 0720   O2SAT 93.8 05/03/2023 0720   CBG (last 3)  Recent Labs    05/05/23 1614 05/05/23 2124 05/06/23 0628  GLUCAP 123* 129* 145*    Assessment/Plan: S/P Procedure(s) (LRB): XI ROBOTIC ASSISTED THORACOSCOPY-LEFT UPPER LOBECTOMY  (Left) NODE DISSECTION (Left) INTERCOSTAL NERVE BLOCK (Left)  CV- NSR, tachy at times, BP stable- continue Lisinopril Pulm- CT on suction w/o air leak.. 300 cc serous fluid out.Marland Kitchen CXR with increasing opacity/atelectasis.Marland Kitchen stable apical space.. continue aggressive pulmonary toilet, transition to water seal Renal-creatinine, lytes remains WNL.Marland Kitchen continue home Lasix, potassium regimen Lovenox for DVT prophylaxis ID- afebrile, no leukocytosis.... increasing opacity on CXR... difficulty expectorating sputum ? Empiric ABX? DM- sugars stable, will resume home Metformin   LOS: 3 days    Lowella Dandy, PA-C 05/06/2023   Chart reviewed, patient examined, agree with above. CXR shows continued atelectasis of LLL. I don't think it looks much different from yesterday. She is afebrile with WBC 12 so I think she just needs to continue IS and ambulation. CT to water seal. No air leak today.

## 2023-05-06 NOTE — Plan of Care (Signed)

## 2023-05-07 ENCOUNTER — Inpatient Hospital Stay (HOSPITAL_COMMUNITY): Payer: Medicare Other

## 2023-05-07 LAB — GLUCOSE, CAPILLARY
Glucose-Capillary: 111 mg/dL — ABNORMAL HIGH (ref 70–99)
Glucose-Capillary: 131 mg/dL — ABNORMAL HIGH (ref 70–99)
Glucose-Capillary: 96 mg/dL (ref 70–99)
Glucose-Capillary: 164 mg/dL — ABNORMAL HIGH (ref 70–99)

## 2023-05-07 MED ORDER — METOPROLOL SUCCINATE ER 25 MG PO TB24
25.0000 mg | ORAL_TABLET | Freq: Every day | ORAL | Status: DC
  Administered 2023-05-07 – 2023-05-08 (×2): 25 mg via ORAL
  Filled 2023-05-07 (×2): qty 1

## 2023-05-07 MED ORDER — LEVALBUTEROL HCL 0.63 MG/3ML IN NEBU: 0.6300 mg | INHALATION_SOLUTION | Freq: Three times a day (TID) | RESPIRATORY_TRACT | Status: DC | PRN

## 2023-05-07 NOTE — Plan of Care (Signed)

## 2023-05-07 NOTE — Plan of Care (Signed)
  Problem: Education: Goal: Knowledge of General Education information will improve Description: Including pain rating scale, medication(s)/side effects and non-pharmacologic comfort measures Outcome: Progressing   Problem: Clinical Measurements: Goal: Respiratory complications will improve Outcome: Progressing   Problem: Activity: Goal: Risk for activity intolerance will decrease Outcome: Progressing   Problem: Pain Managment: Goal: General experience of comfort will improve Outcome: Progressing   Problem: Skin Integrity: Goal: Risk for impaired skin integrity will decrease Outcome: Progressing

## 2023-05-07 NOTE — Progress Notes (Signed)
Patient noted to have two deep red marks on the left side of her back. These deep red marks are in the shape of tape and very close to CT incision. It appears to be a reaction from the tape that had been in place.

## 2023-05-07 NOTE — Progress Notes (Signed)
Mobility Specialist Progress Note:   05/07/23 0939  Oxygen Therapy  O2 Device Nasal Cannula  O2 Flow Rate (L/min) 2 L/min  Mobility  Activity Ambulated with assistance in hallway  Level of Assistance Standby assist, set-up cues, supervision of patient - no hands on  Assistive Device Front wheel walker  Distance Ambulated (ft) 400 ft  Activity Response Tolerated well  Mobility Referral Yes  $Mobility charge 1 Mobility  Mobility Specialist Start Time (ACUTE ONLY) [id number]  Mobility Specialist Stop Time (ACUTE ONLY) 0947  Mobility Specialist Time Calculation (min) (ACUTE ONLY) 24 min    Pre Mobility: 107 HR,  117/50 (72) BP,  95% SpO2 During Mobility: 128 HR,  94% SpO2 Post Mobility:  115 HR,  94% SpO2  Pt received in bed, agreeable to mobility. Asymptomatic throughout w/ no complaints. Pt left in bed with call bell and family present.  Christina Jenkins Mobility Specialist Please contact via Special educational needs teacher or Rehab office at [phone]

## 2023-05-07 NOTE — Progress Notes (Addendum)
4 Days Post-Op Procedure(s) (LRB): XI ROBOTIC ASSISTED THORACOSCOPY-LEFT UPPER LOBECTOMY (Left) NODE DISSECTION (Left) INTERCOSTAL NERVE BLOCK (Left) Subjective: + sputum , describes as clear, not SOB at rest  Objective: Vital signs in last 24 hours: Temp:  [98 F (36.7 C)-98.4 F (36.9 C)] 98.4 F (36.9 C) (09/03 0415) Pulse Rate:  [105-122] 116 (09/03 0415) Cardiac Rhythm: Sinus tachycardia (09/02 1915) Resp:  [17-24] 18 (09/03 0415) BP: (101-135)/(42-64) 102/64 (09/03 0415) SpO2:  [91 %-95 %] 95 % (09/03 0415)  Hemodynamic parameters for last 24 hours:    Intake/Output from previous day: 09/02 0701 - 09/03 0700 In: 480 [P.O.:480] Out: 1640 [Urine:1450; Chest Tube:190] Intake/Output this shift: No intake/output data recorded.  General appearance: alert, cooperative, and no distress Heart: regular rate and rhythm and tachy Lungs: coarse BS, dim in left base Abdomen: benign Extremities: minor edema , no calf tenderness  Wound: healing well  Lab Results: Recent Labs    05/05/23 0711 05/06/23 0159  WBC 15.2* 12.2*  HGB 11.7* 11.6*  HCT 35.1* 35.8*  PLT 156 146*   BMET:  Recent Labs    05/05/23 0711 05/06/23 0159  NA 134* 136  K 3.4* 3.5  CL 102 103  CO2 25 26  GLUCOSE 133* 119*  BUN 9 10  CREATININE 0.93 0.71  CALCIUM 8.3* 8.2*    PT/INR: No results for input(s): "LABPROT", "INR" in the last 72 hours. ABG    Component Value Date/Time   PHART 7.41 05/03/2023 0720   HCO3 27.9 05/03/2023 0720   O2SAT 93.8 05/03/2023 0720   CBG (last 3)  Recent Labs    05/06/23 1556 05/06/23 2135 05/07/23 0603  GLUCAP 108* 128* 111*    Meds Scheduled Meds:  acetaminophen  1,000 mg Oral Q6H   Or   acetaminophen (TYLENOL) oral liquid 160 mg/5 mL  1,000 mg Oral Q6H   DULoxetine  120 mg Oral QHS   enoxaparin (LOVENOX) injection  40 mg Subcutaneous Daily   furosemide  40 mg Oral Daily   gabapentin  300 mg Oral BID   guaiFENesin  1,200 mg Oral BID   insulin  aspart  0-24 Units Subcutaneous TID WC   levETIRAcetam  500 mg Oral QHS   lisinopril  10 mg Oral Daily   metFORMIN  1,000 mg Oral QPM   pantoprazole  40 mg Oral Daily   potassium chloride  20 mEq Oral Daily   rosuvastatin  20 mg Oral QHS   Continuous Infusions: PRN Meds:.albuterol, morphine injection, ondansetron (ZOFRAN) IV, mouth rinse, oxyCODONE  Xrays DG CHEST PORT 1 VIEW  Result Date: 05/06/2023 CLINICAL DATA:  [id number] S/P lobectomy of lung [id number] EXAM: PORTABLE CHEST - 1 VIEW COMPARISON:  the previous day's study FINDINGS: Left chest tube remains directed to the apex. No pneumothorax is evident. Volume loss on the left with consolidation/atelectasis and veins, and staple lines in the residual lung. Right lung remains clear. Heart size and mediastinal contours are within normal limits. Visualized bones unremarkable. IMPRESSION: Postoperative changes on the left with chest tube in place. No pneumothorax. Electronically Signed   By: Corlis Leak M.D.   On: 05/06/2023 10:41    Assessment/Plan: S/P Procedure(s) (LRB): XI ROBOTIC ASSISTED THORACOSCOPY-LEFT UPPER LOBECTOMY (Left) NODE DISSECTION (Left) INTERCOSTAL NERVE BLOCK (Left) POD#4  1 afeb, VSS except  sinus tachy (110's-120's) 2 O2 sats good on RA 3 CT 190 cc/24 h, no air leak- likely d/c today 4 Good UOP, + BM 5 CXR-significantly improved aeration left nase 6  BS well controlled, back on metformin 7 no new labs 8 lovenox for DVT ppx 9 cont pulm hygiene and rehab      LOS: 4 days    Rowe Clack PA-C Pager [phone] 05/07/2023  Patient seen and examined, agree with above No air leak and < 200 ml last 24 hours- dc chest tube CXR shows some LLL atelectasis, elevated left hemidiaphragm Sinus tachy- start low dose Toprol, change albuterol to levalbuterol Path pending Hopefully home tomorrow  Viviann Spare C. Dorris Fetch, MD [facility] [phone]

## 2023-05-08 ENCOUNTER — Inpatient Hospital Stay (HOSPITAL_COMMUNITY): Payer: Medicare Other

## 2023-05-08 LAB — GLUCOSE, CAPILLARY: Glucose-Capillary: 116 mg/dL — ABNORMAL HIGH (ref 70–99)

## 2023-05-08 MED ORDER — ACETAMINOPHEN 500 MG PO TABS: 1000.0000 mg | ORAL_TABLET | Freq: Four times a day (QID) | ORAL | Status: DC

## 2023-05-08 MED ORDER — GUAIFENESIN ER 600 MG PO TB12: 1200.0000 mg | ORAL_TABLET | Freq: Two times a day (BID) | ORAL | 0 refills | Status: AC

## 2023-05-08 MED ORDER — ACETAMINOPHEN 160 MG/5ML PO SOLN: 1000.0000 mg | Freq: Four times a day (QID) | ORAL | Status: DC

## 2023-05-08 MED ORDER — AMOXICILLIN-POT CLAVULANATE 875-125 MG PO TABS: 1.0000 | ORAL_TABLET | Freq: Two times a day (BID) | ORAL | 0 refills | Status: DC

## 2023-05-08 MED ORDER — METOPROLOL SUCCINATE ER 25 MG PO TB24: 25.0000 mg | ORAL_TABLET | Freq: Every day | ORAL | 1 refills | Status: DC

## 2023-05-08 MED ORDER — AMOXICILLIN-POT CLAVULANATE 875-125 MG PO TABS
1.0000 | ORAL_TABLET | Freq: Two times a day (BID) | ORAL | Status: DC
  Administered 2023-05-08: 1 via ORAL
  Filled 2023-05-08: qty 1

## 2023-05-08 MED ORDER — OXYCODONE HCL 5 MG PO TABS: 5.0000 mg | ORAL_TABLET | Freq: Four times a day (QID) | ORAL | 0 refills | Status: AC | PRN

## 2023-05-08 NOTE — TOC Transition Note (Signed)
Transition of Care [facility]) - CM/SW Discharge Note   Patient Details  Name: Christina Jenkins MRN: [id number] Date of Birth: 1960/12/01  Transition of Care [facility]) CM/SW Contact:  Leone Haven, RN Phone Number: 05/08/2023, 11:00 AM   Clinical Narrative:    For dc today, spouse is at the bedside, she will need oxygen, she has no preference of the DME agency.  She is ok with [employer].  NCM made referral to [facility] with [employer] for home oxygen, a rep will bring the tank to patient's room, and the concentrator will be set up at home.     Final next level of care: Home/Self Care Barriers to Discharge: No Barriers Identified   Patient Goals and CMS Choice CMS Medicare.gov Compare Post Acute Care list provided to:: Patient Choice offered to / list presented to : Patient  Discharge Placement                         Discharge Plan and Services Additional resources added to the After Visit Summary for                  DME Arranged: Oxygen DME Agency: [employer] Date DME Agency Contacted: 05/08/23 Time DME Agency Contacted: 1059 Representative spoke with at DME Agency: Vaughan Basta HH Arranged: NA          Social Determinants of Health (SDOH) Interventions SDOH Screenings   Food Insecurity: No Food Insecurity (05/04/2023)  Housing: Patient Declined (05/04/2023)  Transportation Needs: No Transportation Needs (05/04/2023)  Utilities: Not At Risk (05/04/2023)  Tobacco Use: High Risk (05/03/2023)     Readmission Risk Interventions     No data to display

## 2023-05-08 NOTE — Progress Notes (Addendum)
5 Days Post-Op Procedure(s) (LRB): XI ROBOTIC ASSISTED THORACOSCOPY-LEFT UPPER LOBECTOMY (Left) NODE DISSECTION (Left) INTERCOSTAL NERVE BLOCK (Left) Subjective: Some clear sputum production, work of breathing not increased  Objective: Vital signs in last 24 hours: Temp:  [98 F (36.7 C)-101.2 F (38.4 C)] 98.8 F (37.1 C) (09/04 0308) Pulse Rate:  [90-114] 96 (09/04 0308) Cardiac Rhythm: Sinus tachycardia (09/03 1927) Resp:  [16-20] 16 (09/04 0308) BP: (98-117)/(36-86) 102/59 (09/04 0308) SpO2:  [89 %-98 %] 94 % (09/04 0308)  Hemodynamic parameters for last 24 hours:    Intake/Output from previous day: 09/03 0701 - 09/04 0700 In: 480 [P.O.:480] Out: -  Intake/Output this shift: No intake/output data recorded.  General appearance: alert, cooperative, and no distress Heart: regular rate and rhythm Lungs: some scattered ronchi, coarseness, improves with cough Abdomen: benign Extremities: no edema or calf tenderness Wound: incis healing well  Lab Results: Recent Labs    05/05/23 0711 05/06/23 0159  WBC 15.2* 12.2*  HGB 11.7* 11.6*  HCT 35.1* 35.8*  PLT 156 146*   BMET:  Recent Labs    05/05/23 0711 05/06/23 0159  NA 134* 136  K 3.4* 3.5  CL 102 103  CO2 25 26  GLUCOSE 133* 119*  BUN 9 10  CREATININE 0.93 0.71  CALCIUM 8.3* 8.2*    PT/INR: No results for input(s): "LABPROT", "INR" in the last 72 hours. ABG    Component Value Date/Time   PHART 7.41 05/03/2023 0720   HCO3 27.9 05/03/2023 0720   O2SAT 93.8 05/03/2023 0720   CBG (last 3)  Recent Labs    05/07/23 1559 05/07/23 2137 05/08/23 0610  GLUCAP 96 164* 116*    Meds Scheduled Meds:  acetaminophen  1,000 mg Oral Q6H   Or   acetaminophen (TYLENOL) oral liquid 160 mg/5 mL  1,000 mg Oral Q6H   DULoxetine  120 mg Oral QHS   enoxaparin (LOVENOX) injection  40 mg Subcutaneous Daily   furosemide  40 mg Oral Daily   gabapentin  300 mg Oral BID   guaiFENesin  1,200 mg Oral BID   insulin  aspart  0-24 Units Subcutaneous TID WC   levETIRAcetam  500 mg Oral QHS   lisinopril  10 mg Oral Daily   metFORMIN  1,000 mg Oral QPM   metoprolol succinate  25 mg Oral Daily   pantoprazole  40 mg Oral Daily   potassium chloride  20 mEq Oral Daily   rosuvastatin  20 mg Oral QHS   Continuous Infusions: PRN Meds:.levalbuterol, morphine injection, ondansetron (ZOFRAN) IV, mouth rinse, oxyCODONE  Xrays DG Chest 1V REPEAT Same Day  Result Date: 05/07/2023 CLINICAL DATA:  Chest tube removal EXAM: CHEST - 1 VIEW SAME DAY COMPARISON:  05/07/2023 FINDINGS: Elevation of the left hemidiaphragm. Postoperative partial left lung resection. Interval removal of the left chest tube. Small residual left pleural effusion. No pneumothorax. Right lung is clear. Heart size and pulmonary vascularity are normal. IMPRESSION: Left lung volume loss postoperatively. Small residual left pleural effusion or thickening. No pneumothorax after chest tube removal. Electronically Signed   By: Burman Nieves M.D.   On: 05/07/2023 17:09   DG Chest 2 View  Result Date: 05/07/2023 CLINICAL DATA:  Left-sided chest tube in place. EXAM: CHEST - 2 VIEW COMPARISON:  Chest x-ray from yesterday. FINDINGS: Left-sided chest tube appears slightly retracted in the interval, although this may be related to patient positioning. Stable cardiomediastinal silhouette with normal heart size. Similar postsurgical changes in the left lung with volume  loss, left basilar opacity, and small left pleural effusion. The right lung is clear. No pneumothorax. No acute osseous abnormality. IMPRESSION: 1. Left-sided chest tube appears slightly retracted in the interval, although this may be related to patient positioning. 2. Similar postsurgical changes in the left lung with small left pleural effusion and left basilar atelectasis. Electronically Signed   By: Obie Dredge M.D.   On: 05/07/2023 11:09    Assessment/Plan: S/P Procedure(s) (LRB): XI ROBOTIC  ASSISTED THORACOSCOPY-LEFT UPPER LOBECTOMY (Left) NODE DISSECTION (Left) INTERCOSTAL NERVE BLOCK (Left) POD#5  1 Tmax 101.2, BP well controlled, some sinus tachy- started on beta blocker yesterday- says she had drank some hot liquid prior to temp being elevated- no recurrence of elev temp 2 O2 sats ok on 2 liters [place]- has been off and on 0-2 liters, will see how she does with ambulation off O2- may have some clinical bronchitis 3 voiding- amts unmeasured 4 CXR similar appearance of left side small effus/atx, poss left elev of hemidiaphragm 5 no new labs  6 poss home later today vs tomorrow   LOS: 5 days    Rowe Clack PA-C Pager [phone] 05/08/2023 Patient seen and examined, agree with above Some uncertainty regarding temp of 101.2 yesterday She does have a cough- will start PO Augmentin for presumed bronchitis Dc home later today, likely will need sort term home O2  Viviann Spare C. Dorris Fetch, MD [facility] [phone]

## 2023-05-08 NOTE — Progress Notes (Signed)
Pt walked in the hallway on room air sats maintained 91-95 % for about 75 feet then dropped to 83-87%. Stopped to took deep breaths O2 back to 88-89%. Place on 2 L of O2 maintained above 92% for additional 75 ft.

## 2023-05-08 NOTE — Care Management Important Message (Signed)
Important Message  Patient Details  Name: Christina Jenkins MRN: [id number] Date of Birth: [birth year]   Medicare Important Message Given:  Yes Patient left prior to IM delivery will mail the copy to the patient home address.      Shepherd Finnan 05/08/2023, 3:38 PM

## 2023-05-08 NOTE — Plan of Care (Signed)
  Problem: Education: Goal: Knowledge of General Education information will improve Description Including pain rating scale, medication(s)/side effects and non-pharmacologic comfort measures Outcome: Progressing   Problem: Health Behavior/Discharge Planning: Goal: Ability to manage health-related needs will improve Outcome: Progressing   Problem: Clinical Measurements: Goal: Ability to maintain clinical measurements within normal limits will improve Outcome: Progressing Goal: Will remain free from infection Outcome: Progressing Goal: Diagnostic test results will improve Outcome: Progressing Goal: Respiratory complications will improve Outcome: Progressing Goal: Cardiovascular complication will be avoided Outcome: Progressing   Problem: Activity: Goal: Risk for activity intolerance will decrease Outcome: Progressing   Problem: Coping: Goal: Level of anxiety will decrease Outcome: Progressing   Problem: Elimination: Goal: Will not experience complications related to bowel motility Outcome: Progressing Goal: Will not experience complications related to urinary retention Outcome: Progressing   Problem: Pain Managment: Goal: General experience of comfort will improve Outcome: Progressing   Problem: Safety: Goal: Ability to remain free from injury will improve Outcome: Progressing   Problem: Skin Integrity: Goal: Risk for impaired skin integrity will decrease Outcome: Progressing   Problem: Education: Goal: Knowledge of disease or condition will improve Outcome: Progressing Goal: Knowledge of the prescribed therapeutic regimen will improve Outcome: Progressing   Problem: Activity: Goal: Risk for activity intolerance will decrease Outcome: Progressing   Problem: Cardiac: Goal: Will achieve and/or maintain hemodynamic stability Outcome: Progressing   Problem: Clinical Measurements: Goal: Postoperative complications will be avoided or minimized Outcome:  Progressing   Problem: Respiratory: Goal: Respiratory status will improve Outcome: Progressing   Problem: Pain Management: Goal: Pain level will decrease Outcome: Progressing   Problem: Skin Integrity: Goal: Wound healing without signs and symptoms infection will improve Outcome: Progressing

## 2023-05-08 NOTE — Progress Notes (Signed)
SATURATION QUALIFICATIONS: (This note is used to comply with regulatory documentation for home oxygen)  Patient Saturations on Room Air at Rest = 98%  Patient Saturations on Room Air while Ambulating = 83%  Patient Saturations on 2 Liters of oxygen while Ambulating = 98%

## 2023-05-08 NOTE — Progress Notes (Signed)
Discharge instructions given to pt and husband, all set for discharge. Awaiting delivery of oxygen.

## 2023-05-09 LAB — SURGICAL PATHOLOGY

## 2023-05-09 NOTE — Telephone Encounter (Signed)
Patient contacted the office and inquired when she can start her medications again. Advised the patient she can resume the medications only if approved by the oncologist. Patient states she does not have an oncologist and that she just had the lobectomy. Advised the patient she should talk to her surgeon to ensure that she is healing okay and that there is not infection. Patient states her surgeon advised her to contact Dr. Corliss Skains. Patient states she also had a flare up Friday morning. Advised the patient I would send a message and that we could get back to her on Monday. Please advise.

## 2023-05-13 ENCOUNTER — Ambulatory Visit: Payer: Self-pay | Admitting: *Deleted

## 2023-05-13 DIAGNOSIS — Z4802 Encounter for removal of sutures: Secondary | ICD-10-CM

## 2023-05-13 NOTE — Telephone Encounter (Signed)
Patient advised She will require surgical clearance prior to resuming Humira and arava. Patient verbalized understanding. Patient states she has an appointment with them on 05/22/2023 and will discuss her medications then.

## 2023-05-13 NOTE — Telephone Encounter (Signed)
Attempted to contact the patient and left message for patient to call the office.  

## 2023-05-13 NOTE — Telephone Encounter (Signed)
She will require surgical clearance prior to resuming Humira and arava.

## 2023-05-13 NOTE — Progress Notes (Signed)
Patient arrived for nurse visit to remove sutures post-Left RATS lobectomy 8/30 by Dr. Dorris Fetch.  One suture removed with no signs or symptoms of infection noted.  Incision well approximated. Patient tolerated suture removal well.  Patient and family instructed to keep the incision site clean and dry. Reviewed need for CXR prior to next weeks appt with Dr. Dorris Fetch. Patient and family acknowledged instructions given.  All questions answered.

## 2023-05-15 DIAGNOSIS — Z0000 Encounter for general adult medical examination without abnormal findings: Secondary | ICD-10-CM | POA: Diagnosis not present

## 2023-05-15 DIAGNOSIS — E7849 Other hyperlipidemia: Secondary | ICD-10-CM | POA: Diagnosis not present

## 2023-05-15 DIAGNOSIS — E782 Mixed hyperlipidemia: Secondary | ICD-10-CM | POA: Diagnosis not present

## 2023-05-15 DIAGNOSIS — E119 Type 2 diabetes mellitus without complications: Secondary | ICD-10-CM | POA: Diagnosis not present

## 2023-05-15 DIAGNOSIS — E876 Hypokalemia: Secondary | ICD-10-CM | POA: Diagnosis not present

## 2023-05-15 DIAGNOSIS — E039 Hypothyroidism, unspecified: Secondary | ICD-10-CM | POA: Diagnosis not present

## 2023-05-15 DIAGNOSIS — Z1329 Encounter for screening for other suspected endocrine disorder: Secondary | ICD-10-CM | POA: Diagnosis not present

## 2023-05-17 ENCOUNTER — Other Ambulatory Visit: Payer: Self-pay | Admitting: Physician Assistant

## 2023-05-17 ENCOUNTER — Other Ambulatory Visit: Payer: Self-pay

## 2023-05-17 ENCOUNTER — Telehealth: Payer: Self-pay | Admitting: *Deleted

## 2023-05-17 MED ORDER — OXYCODONE HCL 5 MG PO TABS
5.0000 mg | ORAL_TABLET | Freq: Four times a day (QID) | ORAL | 0 refills | Status: DC | PRN
Start: 2023-05-17 — End: 2023-05-24

## 2023-05-17 NOTE — Progress Notes (Signed)
The proposed treatment discussed in conference is for discussion purpose only and is not a binding recommendation.  The patients have not been physically examined, or presented with their treatment options.  Therefore, final treatment plans cannot be decided.  

## 2023-05-17 NOTE — Telephone Encounter (Signed)
Patient contacted the office requesting a refill of Oxycodone. Per patient, she is taking every 6-7 hrs for surgical site soreness. States oxy helps with her pain. States she is also taking Ibuprofen for pain management. Refill sent to patient's preferred pharmacy by PA. Patient made aware of refill. Patient is to follow up in clinic with Dr. Dorris Fetch next week.

## 2023-05-17 NOTE — Progress Notes (Signed)
Pt requesting pain medication due to continued pain at surgical site. Pt follows up with Dr. Dorris Fetch on Wednesday. Will refill 1 weeks worth of pain medication.   Jenny Reichmann, PA-C

## 2023-05-20 ENCOUNTER — Other Ambulatory Visit: Payer: Self-pay | Admitting: Thoracic Surgery (Cardiothoracic Vascular Surgery)

## 2023-05-20 DIAGNOSIS — C3492 Malignant neoplasm of unspecified part of left bronchus or lung: Secondary | ICD-10-CM

## 2023-05-21 ENCOUNTER — Encounter: Payer: Self-pay | Admitting: Thoracic Surgery (Cardiothoracic Vascular Surgery)

## 2023-05-21 ENCOUNTER — Ambulatory Visit
Admission: RE | Admit: 2023-05-21 | Discharge: 2023-05-21 | Disposition: A | Discharge status: Home / Self Care | Payer: Medicare Other | Source: Ambulatory Visit | Attending: Thoracic Surgery (Cardiothoracic Vascular Surgery) | Admitting: Thoracic Surgery (Cardiothoracic Vascular Surgery)

## 2023-05-21 ENCOUNTER — Ambulatory Visit (INDEPENDENT_AMBULATORY_CARE_PROVIDER_SITE_OTHER): Payer: Self-pay | Admitting: Thoracic Surgery (Cardiothoracic Vascular Surgery)

## 2023-05-21 ENCOUNTER — Telehealth: Payer: Self-pay

## 2023-05-21 VITALS — BP 82/47 | HR 85 | Resp 20 | Ht 66.0 in | Wt 182.6 lb

## 2023-05-21 DIAGNOSIS — Z902 Acquired absence of lung [part of]: Secondary | ICD-10-CM | POA: Diagnosis not present

## 2023-05-21 DIAGNOSIS — C3492 Malignant neoplasm of unspecified part of left bronchus or lung: Secondary | ICD-10-CM

## 2023-05-21 NOTE — Progress Notes (Signed)
[address]       [name] [address]             [phone]      HPI: Mrs. Worner returns for a scheduled follow-up visit after recent lung resection.  Benisha Castetter is a 62 year old woman with a history of tobacco abuse, COPD, hepatitis C, hyperlipidemia, type 2 diabetes, hypokalemia, depression, rheumatoid arthritis, and stage Ia adenocarcinoma of the left upper lobe. She smoked 2 packs a day for 40 years.  She was found to have a nodule on a low-dose CT for lung cancer screening.  She had a navigational bronchoscopy which showed adenocarcinoma.  Clinical stage was 1A.  I did a robotic assisted lingular sparing left upper lobectomy on 05/03/2023.  Her postoperative course was uncomplicated and she went home on day 5.  She did go home on oxygen.  Continues to have some incisional pain.  Also having more arthritis pain.  She is using oxycodone 5 mg 3-4 times per day.  No respiratory issues.  She has not smoked since her surgery.  Past Medical History:  Diagnosis Date   Adenocarcinoma of upper lobe of left lung (HCC)    Carpal tunnel syndrome    COPD (chronic obstructive pulmonary disease) (HCC)    Coronary artery calcification seen on CT scan    Normal coronaries at cardiac catheterization 2020   Depression    Essential hypertension    Hepatitis C    Hypokalemia    Mixed hyperlipidemia    Rheumatoid arthritis (HCC)    Rheumatoid arthritis (HCC)    Seasonal allergies    Type 2 diabetes mellitus (HCC)    Varicose veins of legs     Current Outpatient Medications  Medication Sig Dispense Refill   amoxicillin-clavulanate (AUGMENTIN) 875-125 MG tablet Take 1 tablet by mouth every 12 (twelve) hours. 20 tablet 0   cetirizine (ZYRTEC) 10 MG tablet Take 10 mg by mouth at bedtime.     DULoxetine (CYMBALTA) 60 MG capsule Take 120 mg by mouth at bedtime.     furosemide (LASIX) 20 MG tablet Take 40 mg by mouth in the morning.     guaiFENesin (MUCINEX) 600 MG 12 hr  tablet Take 2 tablets (1,200 mg total) by mouth 2 (two) times daily. 20 tablet 0   ibuprofen (ADVIL) 200 MG tablet Take 400 mg by mouth every 8 (eight) hours as needed for moderate pain.     levETIRAcetam (KEPPRA XR) 500 MG 24 hr tablet Take 1 tablet (500 mg total) by mouth daily. (Patient taking differently: Take 500 mg by mouth at bedtime.) 30 tablet 0   lisinopril (ZESTRIL) 10 MG tablet Take 10 mg by mouth at bedtime.     metFORMIN (GLUCOPHAGE-XR) 500 MG 24 hr tablet Take 1,000 mg by mouth daily after supper.     Multiple Vitamin (MULTIVITAMIN) tablet Take 1 tablet by mouth at bedtime.     oxyCODONE (ROXICODONE) 5 MG immediate release tablet Take 1 tablet (5 mg total) by mouth every 6 (six) hours as needed for moderate pain. 28 tablet 0   Potassium Chloride ER 20 MEQ TBCR Take 20 mEq by mouth at bedtime.     rosuvastatin (CRESTOR) 20 MG tablet Take 20 mg by mouth at bedtime.     No current facility-administered medications for this visit.    Physical Exam BP (!) 82/47 (BP Location: Left Arm, Patient Position: Sitting, Cuff Size: Normal)   Pulse 85   Resp  20   Ht 5\' 6"  (1.676 m)   Wt 182 lb 9.6 oz (82.8 kg)   SpO2 96% Comment: RA  BMI 29.48 kg/m  62 year old woman in no acute distress Alert and oriented x 3 with no focal deficits Lungs diminished at left base but otherwise clear Cardiac regular rate and rhythm with normal S1 and S2 Incisions healing well No peripheral edema  Diagnostic Tests: I personally reviewed the chest x-ray images.  There has been markedly improved reexpansion of the left lung since her last film in the hospital.  Postoperative changes present.  Impression: Shalese Gedney is a 62 year old woman with a history of tobacco abuse, COPD, hepatitis C, hyperlipidemia, type 2 diabetes, hypokalemia, depression, rheumatoid arthritis, and stage Ia adenocarcinoma of the left upper lobe.  Stage Ia adenocarcinoma left upper lobe-will refer to Dr. Ellin Saba.  Should not  need any adjuvant chemo or radiation but will need long-term follow-up.  Status post lingular sparing left upper lobectomy-doing well overall.  Does still have some incisional pain.  She is also having more pain from her arthritis.  Between the 2 she is using oxycodone about 4 times a day.  Recommended that she try to wean herself off that as quickly as possible.  Advised her not to drive until she is down to no more than twice a day on the oxycodone.  At that point she can start driving on a limited basis around town.  Tobacco abuse-quit smoking the days you came into the hospital.  Congratulated her for not smoking postdischarge.  Hopefully she can continue to abstain.  Rheumatoid arthritis-was on Humira and Arava preoperatively.  From a surgical standpoint those could be restarted in 6 weeks.  Not sure there is a clear answer from an oncologic standpoint but would defer to Dr. Ellin Saba and her rheumatologist to decide regarding use of this medications going forward.  Plan: DC home O2 Refer to Dr. Ellin Saba at [facility] Return in 6 weeks with PA lateral chest x-ray  [name], MD [facility] [phone]

## 2023-05-21 NOTE — Telephone Encounter (Signed)
Letter faxed to [employer] per Dr. Sunday Corn request for discontinuation of oxygen.

## 2023-05-22 ENCOUNTER — Ambulatory Visit: Payer: Medicare Other | Admitting: Thoracic Surgery (Cardiothoracic Vascular Surgery)

## 2023-05-24 ENCOUNTER — Other Ambulatory Visit: Payer: Self-pay | Admitting: Thoracic Surgery (Cardiothoracic Vascular Surgery)

## 2023-05-24 ENCOUNTER — Telehealth: Payer: Self-pay

## 2023-05-24 MED ORDER — OXYCODONE HCL 5 MG PO TABS: 5.0000 mg | ORAL_TABLET | Freq: Three times a day (TID) | ORAL | 0 refills | Status: DC | PRN

## 2023-05-24 NOTE — Telephone Encounter (Signed)
Patient requesting oxygen and supply pick-up. She was seen in the office Tuesday and Dr. Dorris Fetch aware of no longer a need for oxygen. Faxed form for pickup to [employer].

## 2023-05-24 NOTE — Progress Notes (Signed)
Prescription for oxycodone 5 mg PO Q8 PRN, # 21, no refills sent to pharmacy  Viviann Spare C. Dorris Fetch, MD [facility] [phone]

## 2023-05-24 NOTE — Telephone Encounter (Signed)
Patient called and aware of new prescription for Oxycodone sent into patient's preferred pharmacy, [facility].

## 2023-05-24 NOTE — Telephone Encounter (Signed)
-----   Message from [name] sent at 05/24/2023 12:50 PM EDT ----- Regarding: RE: Oxy refill I sent a script ----- Message ----- From: Steve Rattler, RN Sent: 05/24/2023  12:10 PM EDT To: [name], MD Subject: Oxy refill                                     Hey,  She has called requesting a refill of her Oxycodone. She says that it was too early for a refill on Tuesday when you saw her in clinic and to call back on Friday for a refill.  Preferred pharmacy:  [facility] [phone] - [place], [place] - [address] AT [facility] [address]  Phone: [phone] Fax: [phone]  Thanks, Morrie Sheldon

## 2023-05-28 DIAGNOSIS — M25572 Pain in left ankle and joints of left foot: Secondary | ICD-10-CM | POA: Diagnosis not present

## 2023-05-28 DIAGNOSIS — M069 Rheumatoid arthritis, unspecified: Secondary | ICD-10-CM | POA: Diagnosis not present

## 2023-05-28 DIAGNOSIS — I872 Venous insufficiency (chronic) (peripheral): Secondary | ICD-10-CM | POA: Diagnosis not present

## 2023-05-28 DIAGNOSIS — E1159 Type 2 diabetes mellitus with other circulatory complications: Secondary | ICD-10-CM | POA: Diagnosis not present

## 2023-05-28 DIAGNOSIS — J449 Chronic obstructive pulmonary disease, unspecified: Secondary | ICD-10-CM | POA: Diagnosis not present

## 2023-05-28 DIAGNOSIS — E876 Hypokalemia: Secondary | ICD-10-CM | POA: Diagnosis not present

## 2023-05-28 DIAGNOSIS — I700 Atherosclerosis of aorta: Secondary | ICD-10-CM | POA: Diagnosis not present

## 2023-05-28 DIAGNOSIS — J439 Emphysema, unspecified: Secondary | ICD-10-CM | POA: Diagnosis not present

## 2023-05-28 DIAGNOSIS — R5383 Other fatigue: Secondary | ICD-10-CM | POA: Diagnosis not present

## 2023-05-28 DIAGNOSIS — I8310 Varicose veins of unspecified lower extremity with inflammation: Secondary | ICD-10-CM | POA: Diagnosis not present

## 2023-05-28 NOTE — Progress Notes (Unsigned)
Office Visit Note  Patient: Christina Jenkins             Date of Birth: May 06, 1961           MRN: [id number]             PCP: Encarnacion Slates, PA-C Referring: Encarnacion Slates, PA-C Visit Date: 06/11/2023 Occupation: @GUAROCC @  Subjective:  Pain in both shoulders   History of Present Illness: Christina Jenkins is a 62 y.o. female with history of seropositive rheumatoid arthritis.  Patient underwent robotic assisted thoracoscopy left upper lobectomy performed on 05/03/2023 by Dr. Dorris Fetch.  Patient states that she has been holding Arava and Humira for the past 7 weeks.  Patient reports that she received clearance from Dr. Ellin Saba yesterday to resume these medications.  Her next follow up with Dr. Dorris Fetch is not scheduled until 07/02/23. She states her incisions have completely healed. Patient states that she would not require chemotherapy or radiation therapy at this time. Patient has been experiencing more frequent and severe rheumatoid arthritis flares during the gap in therapy.  Patient is having severe pain involving both shoulders.  She currently rates her pain a 7 out of 10.  Her pain has not been responsive to oxycodone.  A prednisone taper was sent to the pharmacy on 06/04/2023 which helped to alleviate her symptoms while taking 20 mg.  She is currently on 10 mg and is having significant discomfort.  She is difficulty sleeping at night as well as painful range of motion.  Patient has requested another course of prednisone to alleviate her symptoms until she is able to resume Humira and Arava as prescribed. She denies any recent or recurrent infections.      Activities of Daily Living:  Patient reports morning stiffness for 1 hour.   Patient Reports nocturnal pain.  Difficulty dressing/grooming: Denies Difficulty climbing stairs: Reports Difficulty getting out of chair: Denies Difficulty using hands for taps, buttons, cutlery, and/or writing: Reports  Review of Systems   Constitutional:  Negative for fatigue.  HENT:  Negative for mouth sores and mouth dryness.   Eyes:  Negative for dryness.  Respiratory:  Negative for shortness of breath.   Cardiovascular:  Negative for chest pain and palpitations.  Gastrointestinal:  Negative for blood in stool, constipation and diarrhea.  Endocrine: Negative for increased urination.  Genitourinary:  Negative for involuntary urination.  Musculoskeletal:  Positive for joint pain, joint pain, joint swelling and morning stiffness. Negative for gait problem, muscle weakness and muscle tenderness.  Skin:  Negative for color change, rash, hair loss and sensitivity to sunlight.  Allergic/Immunologic: Negative for susceptible to infections.  Neurological:  Negative for dizziness and headaches.  Hematological:  Negative for swollen glands.  Psychiatric/Behavioral:  Positive for sleep disturbance. Negative for depressed mood. The patient is not nervous/anxious.     PMFS History:  Patient Active Problem List   Diagnosis Date Noted   Primary adenocarcinoma of upper lobe of left lung (HCC) 06/02/2023   S/P partial lobectomy of lung 05/03/2023   Solitary pulmonary nodule on lung CT 03/02/2023   COPD GOLD ? / group A 03/02/2023   Lung nodule 03/02/2023   Chest pain of uncertain etiology 04/30/2019   Abnormal nuclear stress test    Circulatory system disorder 04/18/2019   Ischemia 04/18/2019   Hyperglycemia, unspecified 12/30/2018   Other abnormal glucose 12/30/2018   History of COPD 05/31/2017   Mixed hyperlipidemia 04/30/2017   Fatigue 12/04/2016   History of seizures  10/23/2016   History of hepatitis C 10/23/2016   ANA positive 10/23/2016   Sicca syndrome (HCC) 10/23/2016   Venous (peripheral) insufficiency 10/08/2016   Varicose veins of lower extremities with inflammation 10/08/2016   Seropositive rheumatoid arthritis (HCC) 08/01/2016   High risk medication use 08/01/2016   Hepatitis C 08/01/2016   Cigarette smoker  08/01/2016   Depression 08/01/2016   Complex partial seizure (HCC) 02/02/2016   MCI (mild cognitive impairment) 11/02/2015   Arthralgia 11/02/2015   Carpal tunnel syndrome 10/20/2015   Syncope 05/17/2015   Convulsions/seizures (HCC) 05/17/2015   Tobacco use disorder 02/23/2014   Edema 09/22/2013   Osteoarthrosis 06/11/2011   Hypertension 12/20/2010   Hypokalemia 12/20/2010    Past Medical History:  Diagnosis Date   Adenocarcinoma of upper lobe of left lung (HCC)    Carpal tunnel syndrome    COPD (chronic obstructive pulmonary disease) (HCC)    Coronary artery calcification seen on CT scan    Normal coronaries at cardiac catheterization 2020   Depression    Essential hypertension    Hepatitis C    Hypokalemia    Mixed hyperlipidemia    Rheumatoid arthritis (HCC)    Rheumatoid arthritis (HCC)    Seasonal allergies    Type 2 diabetes mellitus (HCC)    Varicose veins of legs     Family History  Problem Relation Age of Onset   Diabetes Mother    Pancreatic cancer Mother    Diabetes Father    COPD Father    Dementia Father    Diabetes Brother    Past Surgical History:  Procedure Laterality Date   BACK SURGERY     BRONCHIAL BIOPSY  04/02/2023   Procedure: BRONCHIAL BIOPSIES;  Surgeon: Josephine Igo, DO;  Location: [facility];  Service: Pulmonary;;   BRONCHIAL BRUSHINGS  04/02/2023   Procedure: BRONCHIAL BRUSHINGS;  Surgeon: Josephine Igo, DO;  Location: [facility];  Service: Pulmonary;;   BRONCHIAL NEEDLE ASPIRATION BIOPSY  04/02/2023   Procedure: BRONCHIAL NEEDLE ASPIRATION BIOPSIES;  Surgeon: Josephine Igo, DO;  Location: [facility];  Service: Pulmonary;;   FIDUCIAL MARKER PLACEMENT  04/02/2023   Procedure: FIDUCIAL MARKER PLACEMENT;  Surgeon: Josephine Igo, DO;  Location: [facility];  Service: Pulmonary;;   INTERCOSTAL NERVE BLOCK Left 05/03/2023   Procedure: INTERCOSTAL NERVE BLOCK;  Surgeon: [name], MD;  Location: [facility] OR;  Service:  Thoracic;  Laterality: Left;   LEFT HEART CATH AND CORONARY ANGIOGRAPHY N/A 04/30/2019   Procedure: LEFT HEART CATH AND CORONARY ANGIOGRAPHY;  Surgeon: Runell Gess, MD;  Location: [facility];  Service: Cardiovascular;  Laterality: N/A;   NODE DISSECTION Left 05/03/2023   Procedure: NODE DISSECTION;  Surgeon: [name], MD;  Location: [facility];  Service: Thoracic;  Laterality: Left;   TONSILLECTOMY     TUBAL LIGATION     Social History   Social History Narrative   1 biological child, 1 stepchild   Immunization History  Administered Date(s) Administered   Influenza,inj,Quad PF,6+ Mos 05/16/2017, 05/15/2018, 05/09/2019   Influenza-Unspecified 05/30/2023   Moderna Sars-Covid-2 Vaccination 11/16/2019, 12/14/2019, 08/11/2020, 05/30/2023   Tdap 06/26/2018   Zoster Recombinant(Shingrix) 09/04/2017     Objective: Vital Signs: BP 106/68 (BP Location: Right Arm, Patient Position: Sitting, Cuff Size: Small)   Pulse (!) 114   Resp 14   Ht 5\' 6"  (1.676 m)   Wt 175 lb 6.4 oz (79.6 kg)   BMI 28.31 kg/m    Physical Exam Vitals  and nursing note reviewed.  Constitutional:      Appearance: She is well-developed.  HENT:     Head: Normocephalic and atraumatic.  Eyes:     Conjunctiva/sclera: Conjunctivae normal.  Cardiovascular:     Rate and Rhythm: Normal rate and regular rhythm.     Heart sounds: Normal heart sounds.  Pulmonary:     Effort: Pulmonary effort is normal.     Breath sounds: Normal breath sounds.  Abdominal:     General: Bowel sounds are normal.     Palpations: Abdomen is soft.  Musculoskeletal:     Cervical back: Normal range of motion.  Lymphadenopathy:     Cervical: No cervical adenopathy.  Skin:    General: Skin is warm and dry.     Capillary Refill: Capillary refill takes less than 2 seconds.  Neurological:     Mental Status: She is alert and oriented to person, place, and time.  Psychiatric:        Behavior: Behavior normal.       Musculoskeletal Exam: C-spine has limited ROM with lateral rotation.  Painful and limited ROM of both shoulders.  Elbow joints, wrist joints, MCPs, PIPs, and DIPs good ROM with no synovitis.  Complete fist formation bilaterally.  Hip joints have good ROM with no groin pain.  Knee joints have good ROM with no tenderness or joint swelling.  Ankle joints have good ROM with no tenderness or synovitis.   CDAI Exam: CDAI Score: 14  Patient Global: 70 / 100; Provider Global: 50 / 100 Swollen: 0 ; Tender: 2  Joint Exam 06/11/2023      Right  Left  Glenohumeral   Tender   Tender     Investigation: No additional findings.  Imaging: DG Chest 2 View  Result Date: 05/21/2023 CLINICAL DATA:  Left lung surgery on 05/03/2023 EXAM: CHEST - 2 VIEW COMPARISON:  05/08/2023 FINDINGS: Heart size within normal limits. Postsurgical changes to the left lung from partial pneumonectomy with left-sided volume loss and mediastinal shift. Similar small left pleural effusion. Improving aeration within the left lung field. Right lung is clear. No pneumothorax. IMPRESSION: 1. Postsurgical changes to the left lung from partial pneumonectomy with left-sided volume loss and mediastinal shift. 2. Improving aeration within the left lung field. Similar small left pleural effusion. Electronically Signed   By: Duanne Guess D.O.   On: 05/21/2023 13:10    Recent Labs: Lab Results  Component Value Date   WBC 12.2 (H) 05/06/2023   HGB 11.6 (L) 05/06/2023   PLT 146 (L) 05/06/2023   NA 136 05/06/2023   K 3.5 05/06/2023   CL 103 05/06/2023   CO2 26 05/06/2023   GLUCOSE 119 (H) 05/06/2023   BUN 10 05/06/2023   CREATININE 0.71 05/06/2023   BILITOT 0.6 05/05/2023   ALKPHOS 67 05/05/2023   AST 19 05/05/2023   ALT 14 05/05/2023   PROT 5.5 (L) 05/05/2023   ALBUMIN 2.4 (L) 05/05/2023   CALCIUM 8.2 (L) 05/06/2023   GFRAA 91 12/09/2019   QFTBGOLDPLUS NEGATIVE 12/09/2019    Speciality Comments: Need Hep C quant every 6  months Prior therapy: methotrexate (oral ulcers) and Cimzia (inadequate response) , Enbrel stopped Jan 2023 Humira started 09/27/21  TB Gold: Negative 01/21/2023  Procedures:  No procedures performed Allergies: Patient has no known allergies.    Assessment / Plan:     Visit Diagnoses: Rheumatoid arthritis with rheumatoid factor of multiple sites without organ or systems involvement [facility]): Patient presents today with severe  pain involving both shoulders.  She has been holding both Humira and Arava for the past 7 weeks due to undergoing a partial lobectomy- left upper lung- on 05/03/23 performed by Dr. Dorris Fetch.  According to the patient she will not require radiation or chemotherapy. Incisions have healed. She was evaluated by Dr. Ellin Saba on 05/3023 and received clearance yesterday from Dr. Ellin Saba to resume humira and arava.  Patient was also advised to reach out to the cardiothoracic surgeon Dr. Dorris Fetch for clearance prior to resuming combination therapy. She has been experiencing more frequent and severe rheumatoid arthritis flares during the gap in therapy.  A prednisone taper sent to the pharmacy on 06/04/2023 which righted significant relief while taking 20 mg daily.  She has since tapered to 10 mg daily and is having significant discomfort involving both shoulders.  She currently rates her pain 7/10 and has not noticed any relief taking oxycodone.   A new prescription for prednisone will be sent for 20 mg x1 wk, 15 mg x1 wk, and then 10 mg x1 week---she will then resume 5 mg daily maintenance dose.   She will follow up in 3 months or sooner if needed.  High risk medication use - Humira 40 mg subcutaneous injections every 14 days and Arava 20 mg 1 tablet by mouth daily. Prednisone 5 mg daily-maintenance dose.  Humira was started on 09/27/2021. CBC and CMP updated on 05/05/23.  Her next lab work will be due in December and every 3 months to monitor for drug toxicity. TB gold negative on  01/21/23.   No recent or recurrent infections.  Long term (current) use of systemic steroids: Prednisone 5 mg daily. Patient is aware of the risks of long term prednisone use.   Chronic pain of both shoulders: Patient presents today with severe pain involving both shoulders.  No recent injury or fall.  Patient has had a gap in therapy for the past 7 weeks after undergoing a left lobectomy on 05/03/2023. A prednisone taper sent to the pharmacy on 06/04/2023 which rotted temporarily for taking 20 mg daily but her symptoms have returned.  She currently rates her pain a 7 out of 10.  A new prednisone taper starting at 20 mg x 1 week, 15 mg x 1 week, then 10 mg in 1 week will be sent to the pharmacy. She will reach out to Dr. Dorris Fetch for clearance to resume Humira and arava.   Primary osteoarthritis of both hands: No synovitis noted.   Primary osteoarthritis of both feet: No increased pain or inflammation at this time.   Trochanteric bursitis of both hips: She has tenderness of bilateral trochanteric bursa.  Encouraged patient to perform stretching exercises daily.    ANA positive: No clinical features of systemic lupus.   Other medical conditions are listed as follows:   History of hepatitis C: HCV quant nonreactive on 11/03/21. Hepatitis C antibody negative on 02/12/22.  HCV antibody negative on 10/10/22.   History of COPD  History of seizures  Smoker   Orders: No orders of the defined types were placed in this encounter.  No orders of the defined types were placed in this encounter.   Follow-Up Instructions: Return in about 3 months (around 09/11/2023) for Rheumatoid arthritis.   Gearldine Bienenstock, PA-C  Note - This record has been created using Dragon software.  Chart creation errors have been sought, but may not always  have been located. Such creation errors do not reflect on  the standard of medical  care.

## 2023-06-02 DIAGNOSIS — C3412 Malignant neoplasm of upper lobe, left bronchus or lung: Secondary | ICD-10-CM | POA: Insufficient documentation

## 2023-06-02 NOTE — Progress Notes (Signed)
[facility] [address] [place] [address]   Clinic Day:  06/03/2023  Referring physician: Encarnacion Slates, PA-C  Patient Care Team: Joeseph Amor as PCP - General (General Practice) Jonelle Sidle, MD as PCP - Cardiology (Cardiology) Case, Helen Hashimoto, MD as Attending Physician (Orthopedic Surgery) Pollyann Savoy, MD as Consulting Physician (Rheumatology)   ASSESSMENT & PLAN:   Assessment:  1.  Stage I (T1b N0 M0) adenocarcinoma of the left upper lobe of the lung: - Abnormal low-dose lung cancer screening scan at [facility]. - PET scan (04/18/2023): 18 mm left upper lobe nodule, SUV 2.1.  No evidence of metastatic disease. - Robotic left VATS, lingular sparing left upper lobectomy, lymph node dissection on 05/03/2023 by Dr. Dorris Fetch. - Pathology: 1.2 cm invasive adenocarcinoma.  No lymphovascular/perineural invasion.  0/13 lymph nodes involved.  pT1b pN0.  2.  Social/family history: - Lives at home with her husband.  On disability since 2016.  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  Quit smoking cigarettes in August 2024.  Smoked 2 packs/day for 40 years. - Maternal grandmother had ovarian cancer.  Mother had pancreatic cancer.  Plan:  1.  Stage I (T1b N0 M0) adenocarcinoma of the left upper lobe of the lung: - I have reviewed scans and pathology report in detail. - No indication for adjuvant therapy. - Discussed surveillance plan in detail with CT of the chest every 6 months for 5 years. - RTC 5 months with CT chest with contrast and routine labs.   Orders Placed This Encounter  Procedures   CT CHEST W CONTRAST    Standing Status:   Future    Standing Expiration Date:   06/02/2024    Order Specific Question:   If indicated for the ordered procedure, I authorize the administration of contrast media per Radiology protocol    Answer:   Yes    Order Specific Question:   Does the patient have a contrast media/X-ray dye allergy?     Answer:   No    Order Specific Question:   Preferred imaging location?    Answer:   [facility]   CBC with Differential    Standing Status:   Future    Standing Expiration Date:   06/02/2024   Comprehensive metabolic panel    Standing Status:   Future    Standing Expiration Date:   06/02/2024      I,Katie Daubenspeck,acting as a scribe for Doreatha Massed, MD.,have documented all relevant documentation on the behalf of Doreatha Massed, MD,as directed by  Doreatha Massed, MD while in the presence of Doreatha Massed, MD.  I, Doreatha Massed MD, have reviewed the above documentation for accuracy and completeness, and I agree with the above.    Doreatha Massed, MD   9/30/20244:48 PM  CHIEF COMPLAINT/PURPOSE OF CONSULT:   Diagnosis: LUL lung adenocarcinoma   Cancer Staging  Primary adenocarcinoma of upper lobe of left lung (HCC) Staging form: Lung, AJCC 8th Edition - Clinical stage from 06/02/2023: Stage IA2 (cT1b, cN0, cM0) - Unsigned    Prior Therapy: left upper lobectomy, 05/03/23  Current Therapy: Surveillance   HISTORY OF PRESENT ILLNESS:   Oncology History   No history exists.      Christina Jenkins is a 62 y.o. female presenting to clinic today for evaluation of LUL lung adenocarcinoma at the request of Dr. Dorris Fetch.  She is a current smoker with a 40-pack-year history of smoking. She  was started on lung cancer screening CT in 07/2020. Her most recent CT from 02/11/23 at [facility] showed: slow-growing mixed solid and sub-solid lesion in left upper lobe, now categorized as Lung-RADS 4BS. She was referred to Dr. Sherene Sires in pulmonology for further evaluation. She underwent super D chest CT on 03/22/23 followed by bronchoscopy with biopsies on 04/02/23 under Dr. Tonia Brooms. Cytology from the LUL lesion confirmed non-small cell carcinoma, morphologically favoring adenocarcinoma. Additional LUL brushing also showed atypical cells suspicious for malignancy.  She  underwent staging PET scan on 04/18/23 showing: 18 mm LUL nodule, corresponding to patient's known primary bronchogenic carcinoma; no evidence of metastatic disease.  She was referred to Dr. Dorris Fetch and proceeded to left upper lobectomy on 05/03/23. Pathology from the procedure showed: invasive adenocarcinoma, 1.2 cm; all margins negative; all 13 lymph nodes negative (0/13).  Today, she states that she is doing well overall. Her appetite level is at 100%. Her energy level is at 75%.  PAST MEDICAL HISTORY:   Past Medical History: Past Medical History:  Diagnosis Date   Adenocarcinoma of upper lobe of left lung (HCC)    Carpal tunnel syndrome    COPD (chronic obstructive pulmonary disease) (HCC)    Coronary artery calcification seen on CT scan    Normal coronaries at cardiac catheterization 2020   Depression    Essential hypertension    Hepatitis C    Hypokalemia    Mixed hyperlipidemia    Rheumatoid arthritis (HCC)    Rheumatoid arthritis (HCC)    Seasonal allergies    Type 2 diabetes mellitus (HCC)    Varicose veins of legs     Surgical History: Past Surgical History:  Procedure Laterality Date   BACK SURGERY     BRONCHIAL BIOPSY  04/02/2023   Procedure: BRONCHIAL BIOPSIES;  Surgeon: Josephine Igo, DO;  Location: [facility];  Service: Pulmonary;;   BRONCHIAL BRUSHINGS  04/02/2023   Procedure: BRONCHIAL BRUSHINGS;  Surgeon: Josephine Igo, DO;  Location: [facility];  Service: Pulmonary;;   BRONCHIAL NEEDLE ASPIRATION BIOPSY  04/02/2023   Procedure: BRONCHIAL NEEDLE ASPIRATION BIOPSIES;  Surgeon: Josephine Igo, DO;  Location: [facility];  Service: Pulmonary;;   FIDUCIAL MARKER PLACEMENT  04/02/2023   Procedure: FIDUCIAL MARKER PLACEMENT;  Surgeon: Josephine Igo, DO;  Location: [facility];  Service: Pulmonary;;   INTERCOSTAL NERVE BLOCK Left 05/03/2023   Procedure: INTERCOSTAL NERVE BLOCK;  Surgeon: [name], MD;  Location: [facility] OR;  Service: Thoracic;   Laterality: Left;   LEFT HEART CATH AND CORONARY ANGIOGRAPHY N/A 04/30/2019   Procedure: LEFT HEART CATH AND CORONARY ANGIOGRAPHY;  Surgeon: Runell Gess, MD;  Location: [facility] CV [facility];  Service: Cardiovascular;  Laterality: N/A;   NODE DISSECTION Left 05/03/2023   Procedure: NODE DISSECTION;  Surgeon: [name], MD;  Location: [facility] OR;  Service: Thoracic;  Laterality: Left;   TONSILLECTOMY     TUBAL LIGATION      Social History: Social History   Socioeconomic History   Marital status: Married    Spouse name: Not on file   Number of children: 1   Years of education: Not on file   Highest education level: Not on file  Occupational History   Not on file  Tobacco Use   Smoking status: Former    Current packs/day: 1.00    Average packs/day: 2.0 packs/day for 42.6 years (85.1 ttl pk-yrs)    Types: Cigarettes    Start date: 10/18/1980    Passive  exposure: Past   Smokeless tobacco: Never  Vaping Use   Vaping status: Never Used  Substance and Sexual Activity   Alcohol use: No   Drug use: No   Sexual activity: Not on file  Other Topics Concern   Not on file  Social History Narrative   1 biological child, 1 stepchild   Social Determinants of Health   Financial Resource Strain: Not on file  Food Insecurity: No Food Insecurity (05/04/2023)   Hunger Vital Sign    Worried About Running Out of Food in the Last Year: Never true    Ran Out of Food in the Last Year: Never true  Transportation Needs: No Transportation Needs (05/04/2023)   PRAPARE - Administrator, Civil Service (Medical): No    Lack of Transportation (Non-Medical): No  Physical Activity: Not on file  Stress: Not on file  Social Connections: Not on file  Intimate Partner Violence: Not At Risk (05/04/2023)   Humiliation, Afraid, Rape, and Kick questionnaire    Fear of Current or Ex-Partner: No    Emotionally Abused: No    Physically Abused: No    Sexually Abused: No    Family  History: Family History  Problem Relation Age of Onset   Diabetes Mother    Pancreatic cancer Mother    Diabetes Father    COPD Father    Dementia Father    Diabetes Brother     Current Medications:  Current Outpatient Medications:    amoxicillin-clavulanate (AUGMENTIN) 875-125 MG tablet, Take 1 tablet by mouth every 12 (twelve) hours., Disp: 20 tablet, Rfl: 0   cetirizine (ZYRTEC) 10 MG tablet, Take 10 mg by mouth at bedtime., Disp: , Rfl:    DULoxetine (CYMBALTA) 60 MG capsule, Take 120 mg by mouth at bedtime., Disp: , Rfl:    furosemide (LASIX) 20 MG tablet, Take 40 mg by mouth in the morning., Disp: , Rfl:    guaiFENesin (MUCINEX) 600 MG 12 hr tablet, Take 2 tablets (1,200 mg total) by mouth 2 (two) times daily., Disp: 20 tablet, Rfl: 0   ibuprofen (ADVIL) 200 MG tablet, Take 400 mg by mouth every 8 (eight) hours as needed for moderate pain., Disp: , Rfl:    levETIRAcetam (KEPPRA XR) 500 MG 24 hr tablet, Take 1 tablet (500 mg total) by mouth daily. (Patient taking differently: Take 500 mg by mouth at bedtime.), Disp: 30 tablet, Rfl: 0   lisinopril (ZESTRIL) 10 MG tablet, Take 10 mg by mouth at bedtime., Disp: , Rfl:    metFORMIN (GLUCOPHAGE-XR) 500 MG 24 hr tablet, Take 1,000 mg by mouth daily after supper., Disp: , Rfl:    Multiple Vitamin (MULTIVITAMIN) tablet, Take 1 tablet by mouth at bedtime., Disp: , Rfl:    Potassium Chloride ER 20 MEQ TBCR, Take 20 mEq by mouth at bedtime., Disp: , Rfl:    rosuvastatin (CRESTOR) 20 MG tablet, Take 20 mg by mouth at bedtime., Disp: , Rfl:    Allergies: No Known Allergies  REVIEW OF SYSTEMS:   Review of Systems  Constitutional:  Negative for chills, fatigue and fever.  HENT:   Negative for lump/mass, mouth sores, nosebleeds, sore throat and trouble swallowing.   Eyes:  Negative for eye problems.  Respiratory:  Positive for cough and shortness of breath.   Cardiovascular:  Negative for chest pain, leg swelling and palpitations.   Gastrointestinal:  Negative for abdominal pain, constipation, diarrhea, nausea and vomiting.  Genitourinary:  Negative for bladder incontinence, difficulty urinating,  dysuria, frequency, hematuria and nocturia.   Musculoskeletal:  Negative for arthralgias, back pain, flank pain, myalgias and neck pain.  Skin:  Negative for itching and rash.  Neurological:  Negative for dizziness, headaches and numbness.  Hematological:  Does not bruise/bleed easily.  Psychiatric/Behavioral:  Negative for depression, sleep disturbance and suicidal ideas. The patient is not nervous/anxious.   All other systems reviewed and are negative.    VITALS:   Blood pressure (!) 99/51, pulse 87, temperature 98.4 F (36.9 C), temperature source Oral, resp. rate 18, height 5\' 6"  (1.676 m), weight 177 lb 14.4 oz (80.7 kg), SpO2 100%.  Wt Readings from Last 3 Encounters:  06/03/23 177 lb 14.4 oz (80.7 kg)  05/21/23 182 lb 9.6 oz (82.8 kg)  05/03/23 197 lb 8.5 oz (89.6 kg)    Body mass index is 28.71 kg/m.  Performance status (ECOG): 1 - Symptomatic but completely ambulatory  PHYSICAL EXAM:   Physical Exam Vitals and nursing note reviewed. Exam conducted with a chaperone present.  Constitutional:      Appearance: Normal appearance.  Cardiovascular:     Rate and Rhythm: Normal rate and regular rhythm.     Pulses: Normal pulses.     Heart sounds: Normal heart sounds.  Pulmonary:     Effort: Pulmonary effort is normal.     Breath sounds: Normal breath sounds.  Abdominal:     Palpations: Abdomen is soft. There is no hepatomegaly, splenomegaly or mass.     Tenderness: There is no abdominal tenderness.  Musculoskeletal:     Right lower leg: No edema.     Left lower leg: No edema.  Lymphadenopathy:     Cervical: No cervical adenopathy.     Right cervical: No superficial, deep or posterior cervical adenopathy.    Left cervical: No superficial, deep or posterior cervical adenopathy.     Upper Body:     Right  upper body: No supraclavicular or axillary adenopathy.     Left upper body: No supraclavicular or axillary adenopathy.  Neurological:     General: No focal deficit present.     Mental Status: She is alert and oriented to person, place, and time.  Psychiatric:        Mood and Affect: Mood normal.        Behavior: Behavior normal.     LABS:      Latest Ref Rng & Units 05/06/2023    1:59 AM 05/05/2023    7:11 AM 05/04/2023    2:39 AM  CBC  WBC 4.0 - 10.5 K/uL 12.2  15.2  14.0   Hemoglobin 12.0 - 15.0 g/dL 57.8  46.9  62.9   Hematocrit 36.0 - 46.0 % 35.8  35.1  36.2   Platelets 150 - 400 K/uL 146  156  139       Latest Ref Rng & Units 05/06/2023    1:59 AM 05/05/2023    7:11 AM 05/04/2023    2:39 AM  CMP  Glucose 70 - 99 mg/dL 528  413  244   BUN 8 - 23 mg/dL 10  9  7    Creatinine 0.44 - 1.00 mg/dL 0.10  2.72  5.36   Sodium 135 - 145 mmol/L 136  134  137   Potassium 3.5 - 5.1 mmol/L 3.5  3.4  3.6   Chloride 98 - 111 mmol/L 103  102  103   CO2 22 - 32 mmol/L 26  25  25    Calcium 8.9 - 10.3  mg/dL 8.2  8.3  8.4   Total Protein 6.5 - 8.1 g/dL  5.5    Total Bilirubin 0.3 - 1.2 mg/dL  0.6    Alkaline Phos 38 - 126 U/L  67    AST 15 - 41 U/L  19    ALT 0 - 44 U/L  14       No results found for: "CEA1", "CEA" / No results found for: "CEA1", "CEA" No results found for: "PSA1" No results found for: "FAO130" No results found for: "CAN125"  No results found for: "TOTALPROTELP", "ALBUMINELP", "A1GS", "A2GS", "BETS", "BETA2SER", "GAMS", "MSPIKE", "SPEI" No results found for: "TIBC", "FERRITIN", "IRONPCTSAT" No results found for: "LDH"   STUDIES:   DG Chest 2 View  Result Date: 05/21/2023 CLINICAL DATA:  Left lung surgery on 05/03/2023 EXAM: CHEST - 2 VIEW COMPARISON:  05/08/2023 FINDINGS: Heart size within normal limits. Postsurgical changes to the left lung from partial pneumonectomy with left-sided volume loss and mediastinal shift. Similar small left pleural effusion. Improving  aeration within the left lung field. Right lung is clear. No pneumothorax. IMPRESSION: 1. Postsurgical changes to the left lung from partial pneumonectomy with left-sided volume loss and mediastinal shift. 2. Improving aeration within the left lung field. Similar small left pleural effusion. Electronically Signed   By: Duanne Guess D.O.   On: 05/21/2023 13:10   DG Chest 2 View  Result Date: 05/08/2023 CLINICAL DATA:  Partial left pneumonectomy. [id number] with left upper lobectomy 05/03/2023. Follow-up pleural effusion. EXAM: CHEST - 2 VIEW COMPARISON:  Portable chest yesterday at 1:02 p.m. FINDINGS: 5:32 a.m. Postsurgical change of partial left pneumonectomy. Left-sided volume loss again noted with small left pleural effusion. Linear atelectatic bands are again seen in the left lower lung field. Haziness in the residual left lung is probably due to posteriorly layering pleural effusion, less likely pneumonitis. Overall aeration is unchanged.  There is no visible pneumothorax. There are numerous calcified granulomas with right lung otherwise clear. The cardiac size is normal. The mediastinal configuration is stable. Osteopenia. IMPRESSION: Postsurgical change of partial left pneumonectomy. Left-sided volume loss with small left pleural effusion. Haziness in the residual left lung is probably due to posteriorly layering pleural effusion, less likely pneumonitis. Overall aeration seems unchanged. Electronically Signed   By: Almira Bar M.D.   On: 05/08/2023 07:46   DG Chest 1V REPEAT Same Day  Result Date: 05/07/2023 CLINICAL DATA:  Chest tube removal EXAM: CHEST - 1 VIEW SAME DAY COMPARISON:  05/07/2023 FINDINGS: Elevation of the left hemidiaphragm. Postoperative partial left lung resection. Interval removal of the left chest tube. Small residual left pleural effusion. No pneumothorax. Right lung is clear. Heart size and pulmonary vascularity are normal. IMPRESSION: Left lung volume loss postoperatively.  Small residual left pleural effusion or thickening. No pneumothorax after chest tube removal. Electronically Signed   By: Burman Nieves M.D.   On: 05/07/2023 17:09   DG Chest 2 View  Result Date: 05/07/2023 CLINICAL DATA:  Left-sided chest tube in place. EXAM: CHEST - 2 VIEW COMPARISON:  Chest x-ray from yesterday. FINDINGS: Left-sided chest tube appears slightly retracted in the interval, although this may be related to patient positioning. Stable cardiomediastinal silhouette with normal heart size. Similar postsurgical changes in the left lung with volume loss, left basilar opacity, and small left pleural effusion. The right lung is clear. No pneumothorax. No acute osseous abnormality. IMPRESSION: 1. Left-sided chest tube appears slightly retracted in the interval, although this may be related to patient positioning.  2. Similar postsurgical changes in the left lung with small left pleural effusion and left basilar atelectasis. Electronically Signed   By: Obie Dredge M.D.   On: 05/07/2023 11:09   DG CHEST PORT 1 VIEW  Result Date: 05/06/2023 CLINICAL DATA:  [id number] S/P lobectomy of lung [id number] EXAM: PORTABLE CHEST - 1 VIEW COMPARISON:  the previous day's study FINDINGS: Left chest tube remains directed to the apex. No pneumothorax is evident. Volume loss on the left with consolidation/atelectasis and veins, and staple lines in the residual lung. Right lung remains clear. Heart size and mediastinal contours are within normal limits. Visualized bones unremarkable. IMPRESSION: Postoperative changes on the left with chest tube in place. No pneumothorax. Electronically Signed   By: Corlis Leak M.D.   On: 05/06/2023 10:41   DG CHEST PORT 1 VIEW  Result Date: 05/05/2023 CLINICAL DATA:  [id number] S/P lobectomy of lung [id number] EXAM: PORTABLE CHEST - 1 VIEW COMPARISON:  the previous day's study FINDINGS: Stable left apical pneumothorax, lung apex projecting just above the posterior aspect left fourth rib. Left  chest drain remains in place. Some increase in airspace opacities through the left lung. Right lung clear. Heart size and mediastinal contour difficult to assess due to adjacent opacities. There is some leftward mediastinal shift as before. Can not exclude left pleural effusion. Visualized bones unremarkable. IMPRESSION: 1. Stable left apical pneumothorax. 2. Increasing left lung opacities. Electronically Signed   By: Corlis Leak M.D.   On: 05/05/2023 09:03

## 2023-06-03 ENCOUNTER — Inpatient Hospital Stay: Payer: Medicare Other

## 2023-06-03 ENCOUNTER — Encounter: Payer: Self-pay | Admitting: Hematology

## 2023-06-03 ENCOUNTER — Inpatient Hospital Stay: Payer: Medicare Other | Attending: Hematology | Admitting: Hematology

## 2023-06-03 VITALS — BP 99/51 | HR 87 | Temp 98.4°F | Resp 18 | Ht 66.0 in | Wt 177.9 lb

## 2023-06-03 DIAGNOSIS — Z87891 Personal history of nicotine dependence: Secondary | ICD-10-CM | POA: Insufficient documentation

## 2023-06-03 DIAGNOSIS — C3412 Malignant neoplasm of upper lobe, left bronchus or lung: Secondary | ICD-10-CM | POA: Diagnosis not present

## 2023-06-03 DIAGNOSIS — Z8041 Family history of malignant neoplasm of ovary: Secondary | ICD-10-CM | POA: Diagnosis not present

## 2023-06-03 DIAGNOSIS — Z800 Family history of malignant neoplasm of digestive organs: Secondary | ICD-10-CM | POA: Diagnosis not present

## 2023-06-03 NOTE — Patient Instructions (Addendum)
[facility] at [facility] Discharge Instructions   You were seen and examined today by Dr. Ellin Saba. Dr. Ellin Saba is a medical oncologist, meaning that he specializes in the treatment of cancer diagnoses. Dr. Ellin Saba discussed your past medical history, family history of cancers, and the events that led to you being here today.   Since this is a Stage I diagnosis, no treatment is required. We will monitor you periodically with CT scan.   Return as scheduled.      Thank you for choosing [facility] at [facility] to provide your oncology and hematology care.  To afford each patient quality time with our provider, please arrive at least 15 minutes before your scheduled appointment time.   If you have a lab appointment with the Cancer Center please come in thru the Main Entrance and check in at the main information desk.  You need to re-schedule your appointment should you arrive 10 or more minutes late.  We strive to give you quality time with our providers, and arriving late affects you and other patients whose appointments are after yours.  Also, if you no show three or more times for appointments you may be dismissed from the clinic at the providers discretion.     Again, thank you for choosing [facility].  Our hope is that these requests will decrease the amount of time that you wait before being seen by our physicians.       _____________________________________________________________  Should you have questions after your visit to [facility], please contact our office at [phone] and follow the prompts.  Our office hours are 8:00 a.m. and 4:30 p.m. Monday - Friday.  Please note that voicemails left after 4:00 p.m. may not be returned until the following business day.  We are closed weekends and major holidays.  You do have access to a nurse 24-7, just call the main number to the clinic [phone] and do  not press any options, hold on the line and a nurse will answer the phone.    For prescription refill requests, have your pharmacy contact our office and allow 72 hours.    Due to Covid, you will need to wear a mask upon entering the hospital. If you do not have a mask, a mask will be given to you at the Main Entrance upon arrival. For doctor visits, patients may have 1 support person age 47 or older with them. For treatment visits, patients can not have anyone with them due to social distancing guidelines and our immunocompromised population.

## 2023-06-04 ENCOUNTER — Other Ambulatory Visit: Payer: Self-pay | Admitting: *Deleted

## 2023-06-04 ENCOUNTER — Other Ambulatory Visit: Payer: Self-pay | Admitting: Thoracic Surgery (Cardiothoracic Vascular Surgery)

## 2023-06-04 MED ORDER — OXYCODONE HCL 5 MG PO TABS
5.0000 mg | ORAL_TABLET | Freq: Two times a day (BID) | ORAL | 0 refills | Status: DC | PRN
Start: 2023-06-04 — End: 2023-06-13

## 2023-06-04 MED ORDER — PREDNISONE 5 MG PO TABS: ORAL_TABLET | ORAL | 0 refills | Status: DC

## 2023-06-04 NOTE — Progress Notes (Signed)
      [address]       [name] [address]             [phone]       Patient requested refill of oxycodone 5 mg p.o. twice daily as needed, 21 tablets, no refills.  Prescription sent.  Salvatore Decent Dorris Fetch, MD [facility] [phone]

## 2023-06-04 NOTE — Telephone Encounter (Signed)
Patient contacted the office stating she had surgery several weeks ago. Patient states she saw her surgeon 2 weeks ago and everything is going well. Patient advised we will need a clearance letter from her surgeon prior to being able to let her resume her medications. Patient states she will call the surgeon to have them send something over. Patient states she has been off her Humira and Arava for about 6 weeks now. Patient states she woke up this morning with a flare in her foot. Patient states it "feels like it is on fire". Patient states she is also having swelling. Patient is requesting a prescription of Prednisone to be sent to the [facility] in [place]. Please advise.

## 2023-06-04 NOTE — Telephone Encounter (Signed)
Advised patient that rx is being sent in. Please review and send to the pharmacy. Thanks!

## 2023-06-04 NOTE — Telephone Encounter (Signed)
Ok to send in prednisone starting at 20 mg tapering by 5 mg every 4 days.

## 2023-06-04 NOTE — Addendum Note (Signed)
Addended by: Ellen Henri on: 06/04/2023 05:04 PM   Modules accepted: Orders

## 2023-06-10 ENCOUNTER — Telehealth: Payer: Self-pay | Admitting: *Deleted

## 2023-06-10 NOTE — Telephone Encounter (Signed)
Dr. Corliss Skains would like to start her back on her [facility] Ranae Plumber, however would like to know if Dr. Ellin Saba is in agreement. She has an appointment with her tomorrow. Per Dr. Ellin Saba, he does not see there being an issue with her restarting her treatment as planned.  Patient made aware.

## 2023-06-11 ENCOUNTER — Telehealth: Payer: Self-pay | Admitting: Rheumatology

## 2023-06-11 ENCOUNTER — Other Ambulatory Visit: Payer: Self-pay

## 2023-06-11 ENCOUNTER — Encounter: Payer: Self-pay | Admitting: Physician Assistant

## 2023-06-11 ENCOUNTER — Ambulatory Visit: Payer: Medicare Other | Attending: Physician Assistant | Admitting: Physician Assistant

## 2023-06-11 VITALS — BP 106/68 | HR 114 | Resp 14 | Ht 66.0 in | Wt 175.4 lb

## 2023-06-11 DIAGNOSIS — Z8619 Personal history of other infectious and parasitic diseases: Secondary | ICD-10-CM

## 2023-06-11 DIAGNOSIS — Z79899 Other long term (current) drug therapy: Secondary | ICD-10-CM | POA: Diagnosis not present

## 2023-06-11 DIAGNOSIS — F17200 Nicotine dependence, unspecified, uncomplicated: Secondary | ICD-10-CM | POA: Diagnosis not present

## 2023-06-11 DIAGNOSIS — Z7952 Long term (current) use of systemic steroids: Secondary | ICD-10-CM | POA: Diagnosis not present

## 2023-06-11 DIAGNOSIS — Z8709 Personal history of other diseases of the respiratory system: Secondary | ICD-10-CM | POA: Diagnosis not present

## 2023-06-11 DIAGNOSIS — M7061 Trochanteric bursitis, right hip: Secondary | ICD-10-CM | POA: Diagnosis not present

## 2023-06-11 DIAGNOSIS — Z87898 Personal history of other specified conditions: Secondary | ICD-10-CM

## 2023-06-11 DIAGNOSIS — M7062 Trochanteric bursitis, left hip: Secondary | ICD-10-CM

## 2023-06-11 DIAGNOSIS — M25511 Pain in right shoulder: Secondary | ICD-10-CM

## 2023-06-11 DIAGNOSIS — M25512 Pain in left shoulder: Secondary | ICD-10-CM

## 2023-06-11 DIAGNOSIS — R768 Other specified abnormal immunological findings in serum: Secondary | ICD-10-CM | POA: Diagnosis not present

## 2023-06-11 DIAGNOSIS — M19071 Primary osteoarthritis, right ankle and foot: Secondary | ICD-10-CM | POA: Diagnosis not present

## 2023-06-11 DIAGNOSIS — M19072 Primary osteoarthritis, left ankle and foot: Secondary | ICD-10-CM

## 2023-06-11 DIAGNOSIS — G8929 Other chronic pain: Secondary | ICD-10-CM

## 2023-06-11 DIAGNOSIS — R7689 Other specified abnormal immunological findings in serum: Secondary | ICD-10-CM

## 2023-06-11 DIAGNOSIS — M19041 Primary osteoarthritis, right hand: Secondary | ICD-10-CM

## 2023-06-11 DIAGNOSIS — M0579 Rheumatoid arthritis with rheumatoid factor of multiple sites without organ or systems involvement: Secondary | ICD-10-CM

## 2023-06-11 DIAGNOSIS — M19042 Primary osteoarthritis, left hand: Secondary | ICD-10-CM

## 2023-06-11 MED ORDER — PREDNISONE 5 MG PO TABS: ORAL_TABLET | ORAL | 0 refills | Status: DC

## 2023-06-11 NOTE — Telephone Encounter (Signed)
Please review and sign pended prednisone taper. Thanks!

## 2023-06-11 NOTE — Telephone Encounter (Signed)
I called patient, new prednisone RX will be sent in before 5:00 pm.

## 2023-06-11 NOTE — Patient Instructions (Signed)
Standing Labs We placed an order today for your standing lab work.   Please have your standing labs drawn in December and every 3 months   Please have your labs drawn 2 weeks prior to your appointment so that the provider can discuss your lab results at your appointment, if possible.  Please note that you may see your imaging and lab results in MyChart before we have reviewed them. We will contact you once all results are reviewed. Please allow our office up to 72 hours to thoroughly review all of the results before contacting the office for clarification of your results.  WALK-IN LAB HOURS  Monday through Thursday from 8:00 am -12:30 pm and 1:00 pm-5:00 pm and Friday from 8:00 am-12:00 pm.  Patients with office visits requiring labs will be seen before walk-in labs.  You may encounter longer than normal wait times. Please allow additional time. Wait times may be shorter on  Monday and Thursday afternoons.  We do not book appointments for walk-in labs. We appreciate your patience and understanding with our staff.   Labs are drawn by [facility]. Please bring your co-pay at the time of your lab draw.  You may receive a bill from [facility] for your lab work.  Please note if you are on Hydroxychloroquine and and an order has been placed for a Hydroxychloroquine level,  you will need to have it drawn 4 hours or more after your last dose.  If you wish to have your labs drawn at another location, please call the office 24 hours in advance so we can fax the orders.  The office is located at [address], [place], [place] [address]   If you have any questions regarding directions or hours of operation,  please call [phone].   As a reminder, please drink plenty of water prior to coming for your lab work. Thanks!

## 2023-06-11 NOTE — Telephone Encounter (Signed)
Pt called asking when her prednisone will be sent in and if it will be sent before 6pm since that's when they close.

## 2023-06-13 ENCOUNTER — Telehealth: Payer: Self-pay

## 2023-06-13 ENCOUNTER — Other Ambulatory Visit: Payer: Self-pay | Admitting: Thoracic Surgery (Cardiothoracic Vascular Surgery)

## 2023-06-13 MED ORDER — OXYCODONE HCL 5 MG PO TABS: 5.0000 mg | ORAL_TABLET | Freq: Two times a day (BID) | ORAL | 0 refills | Status: DC | PRN

## 2023-06-13 NOTE — Telephone Encounter (Signed)
Patient aware of prescription called into preferred pharmacy. She states that Rheumatology and Oncology approved with restart of medications.

## 2023-06-13 NOTE — Progress Notes (Signed)
Oxycodone 5 mg PO BID PRN, #15, 0 refills  Christina Delamater C. Dorris Fetch, MD [facility] [phone]

## 2023-06-13 NOTE — Telephone Encounter (Signed)
-----   Message from [name] sent at 06/13/2023  2:35 PM EDT ----- Regarding: RE: Oxycodone refill Sent a script Ok with me to restart meds if ok with Rheum and Oncology  [facility] ----- Message ----- From: Steve Rattler, RN Sent: 06/13/2023  12:20 PM EDT To: [name], MD Subject: Oxycodone refill                               Hey,  She is asking for one more refill of her Oxycodone. She does follow-up with you on the 29th. This will be her 4th refill since being discharged. She is also asking if she can restart her Rheumatoid medication back? She has spoken with her Rheumatologist about it and is ok if you are to restart, per patient.   If you do refill her preferred pharmacy is:  [facility] [phone] - [place], [place] - [address] AT [facility] [address]  Phone: [phone] Fax: [phone]   Thanks, Morrie Sheldon

## 2023-06-14 ENCOUNTER — Other Ambulatory Visit: Payer: Self-pay | Admitting: Physician Assistant

## 2023-06-14 NOTE — Telephone Encounter (Signed)
Last Fill: 03/18/2023  Labs: 05/06/2023 WBC 12.2, RBC 3.86, Hgb 11.6, Hct 35.8, Platelets 146, Glucose 119, Calcium 8.2  Next Visit: 09/12/2023  Last Visit: 06/11/2023  DX: Rheumatoid arthritis with rheumatoid factor of multiple sites without organ or systems involvement   Current Dose per office note 06/11/2023: Arava 20 mg 1 tablet by mouth daily   Okay to refill Arava ?

## 2023-06-24 ENCOUNTER — Other Ambulatory Visit: Payer: Self-pay | Admitting: *Deleted

## 2023-06-24 MED ORDER — HUMIRA (2 PEN) 40 MG/0.4ML ~~LOC~~ AJKT: 40.0000 mg | AUTO-INJECTOR | SUBCUTANEOUS | 0 refills | Status: DC

## 2023-06-24 NOTE — Telephone Encounter (Signed)
Refill request received via fax from [facility] for Humira  Last Fill: 04/09/2023  Labs: 05/06/2023 WBC 12.2, RBC 3.86, Hgb 11.6, Hct 35.8, Platelets 146, Glucose 119, Calcium 8.2  TB Gold:  Negative 01/21/2023   Next Visit: 09/12/2023  Last Visit: 06/11/2023  [id number] arthritis with rheumatoid factor of multiple sites without organ or systems involvement   Current Dose per office note 06/11/2023: Humira 40 mg subcutaneous injections every 14 days   Okay to refill Humira?

## 2023-06-28 ENCOUNTER — Telehealth: Payer: Self-pay | Admitting: *Deleted

## 2023-06-28 ENCOUNTER — Other Ambulatory Visit: Payer: Self-pay | Admitting: Physician Assistant

## 2023-06-28 MED ORDER — TRAMADOL HCL 50 MG PO TABS
50.0000 mg | ORAL_TABLET | Freq: Four times a day (QID) | ORAL | 0 refills | Status: DC | PRN
Start: 2023-06-28 — End: 2023-07-02

## 2023-06-28 NOTE — Telephone Encounter (Signed)
Patient contacted the office requesting a refill of oxycodone. Patient states she is taking oxycodone as needed. States she may go a day without taking. Reports pain to be sharp in nature. States she has taken Ibuprofen but medication does not help pain. Per Jacques Earthly, PA, Ultram sent to patient's preferred pharmacy. Patient aware. Follow up scheduled with Dr. Dorris Fetch 10/29.

## 2023-07-01 ENCOUNTER — Other Ambulatory Visit: Payer: Self-pay | Admitting: Thoracic Surgery (Cardiothoracic Vascular Surgery)

## 2023-07-01 DIAGNOSIS — C3492 Malignant neoplasm of unspecified part of left bronchus or lung: Secondary | ICD-10-CM

## 2023-07-02 ENCOUNTER — Ambulatory Visit
Admission: RE | Admit: 2023-07-02 | Discharge: 2023-07-02 | Disposition: A | Discharge status: Home / Self Care | Payer: Medicare Other | Source: Ambulatory Visit | Attending: Thoracic Surgery (Cardiothoracic Vascular Surgery) | Admitting: Thoracic Surgery (Cardiothoracic Vascular Surgery)

## 2023-07-02 ENCOUNTER — Encounter: Payer: Self-pay | Admitting: Thoracic Surgery (Cardiothoracic Vascular Surgery)

## 2023-07-02 ENCOUNTER — Ambulatory Visit (INDEPENDENT_AMBULATORY_CARE_PROVIDER_SITE_OTHER): Payer: Self-pay | Admitting: Thoracic Surgery (Cardiothoracic Vascular Surgery)

## 2023-07-02 VITALS — BP 113/73 | HR 98 | Resp 18 | Ht 66.0 in | Wt 176.0 lb

## 2023-07-02 DIAGNOSIS — C3492 Malignant neoplasm of unspecified part of left bronchus or lung: Secondary | ICD-10-CM

## 2023-07-02 DIAGNOSIS — Z902 Acquired absence of lung [part of]: Secondary | ICD-10-CM | POA: Diagnosis not present

## 2023-07-02 MED ORDER — OXYCODONE HCL 5 MG PO TABS: 5.0000 mg | ORAL_TABLET | Freq: Four times a day (QID) | ORAL | 0 refills | Status: DC | PRN

## 2023-07-02 NOTE — Progress Notes (Signed)
[address]       [place] [address]             [phone]     HPI: Christina Jenkins returns for a scheduled follow-up visit  Christina Jenkins is a 62 year old woman with a past history of tobacco abuse, COPD, hepatitis C, hyperlipidemia, type 2 diabetes, hypokalemia, depression, rheumatoid arthritis, and a stage Ia adenocarcinoma of the left upper lobe.  Smoked 2 packs of cigarettes daily for 40 years.  Found to have a lung nodule on low-dose CT for lung cancer screening.  Navigational bronchoscopy showed adenocarcinoma.  She underwent a robotic assisted lingular sparing left upper lobectomy on 05/03/2023.  Her postoperative course was uncomplicated but she did go home on oxygen.  That was subsequently discontinued.  She continues to have incisional pain.  She has been using oxycodone at night before she went to bed.  She is using ibuprofen during the day.  Recently was prescribed tramadol but that is ineffective.  Says she has not smoked since surgery.  Past Medical History:  Diagnosis Date   Adenocarcinoma of upper lobe of left lung (HCC)    Carpal tunnel syndrome    COPD (chronic obstructive pulmonary disease) (HCC)    Coronary artery calcification seen on CT scan    Normal coronaries at cardiac catheterization 2020   Depression    Essential hypertension    Hepatitis C    Hypokalemia    Mixed hyperlipidemia    Rheumatoid arthritis (HCC)    Rheumatoid arthritis (HCC)    Seasonal allergies    Type 2 diabetes mellitus (HCC)    Varicose veins of legs     Current Outpatient Medications  Medication Sig Dispense Refill   adalimumab (HUMIRA, 2 PEN,) 40 MG/0.4ML pen Inject 0.4 mLs (40 mg total) into the skin every 14 (fourteen) days. 6 each 0   amoxicillin-clavulanate (AUGMENTIN) 875-125 MG tablet Take 1 tablet by mouth every 12 (twelve) hours. 20 tablet 0   cetirizine (ZYRTEC) 10 MG tablet Take 10 mg by mouth at bedtime.     DULoxetine (CYMBALTA) 60 MG capsule  Take 120 mg by mouth at bedtime.     furosemide (LASIX) 20 MG tablet Take 40 mg by mouth in the morning.     guaiFENesin (MUCINEX) 600 MG 12 hr tablet Take 2 tablets (1,200 mg total) by mouth 2 (two) times daily. 20 tablet 0   ibuprofen (ADVIL) 200 MG tablet Take 400 mg by mouth every 8 (eight) hours as needed for moderate pain.     leflunomide (ARAVA) 20 MG tablet TAKE 1 TABLET(20 MG) BY MOUTH DAILY 90 tablet 0   levETIRAcetam (KEPPRA XR) 500 MG 24 hr tablet Take 1 tablet (500 mg total) by mouth daily. (Patient taking differently: Take 500 mg by mouth at bedtime.) 30 tablet 0   lisinopril (ZESTRIL) 10 MG tablet Take 10 mg by mouth at bedtime.     metFORMIN (GLUCOPHAGE-XR) 500 MG 24 hr tablet Take 1,000 mg by mouth daily after supper.     Multiple Vitamin (MULTIVITAMIN) tablet Take 1 tablet by mouth at bedtime.     oxyCODONE (OXY IR/ROXICODONE) 5 MG immediate release tablet Take 1 tablet (5 mg total) by mouth every 6 (six) hours as needed for severe pain (pain score 7-10). 21 tablet 0   Potassium Chloride ER 20 MEQ TBCR Take 20 mEq by mouth at bedtime.     predniSONE (DELTASONE) 5 MG tablet Take 4 tabs  po qd x 4 days, 3  tabs po qd x 4 days, 2  tabs po qd x 4 days, 1  tab po qd x 4 days 40 tablet 0   predniSONE (DELTASONE) 5 MG tablet Take 4 tabs po qd x 7 days, 3  tabs po qd x 7 days, 2  tabs po qd x 7 days 63 tablet 0   rosuvastatin (CRESTOR) 20 MG tablet Take 20 mg by mouth at bedtime.     No current facility-administered medications for this visit.    Physical Exam BP 113/73 (BP Location: Left Arm, Patient Position: Sitting)   Pulse 98   Resp 18   Ht 5\' 6"  (1.676 m)   Wt 176 lb (79.8 kg)   SpO2 97% Comment: RA  BMI 28.43 kg/m  62 year old woman in no acute distress Alert and oriented x 3 with no focal deficits Lungs diminished on left, otherwise clear, no rales or wheezing Incisions clean dry and intact No peripheral edema  Diagnostic Tests: I personally reviewed the CT images.   There are postoperative changes on the left.  Impression: Christina Jenkins is a 62 year old woman with a past history of tobacco abuse, COPD, hepatitis C, hyperlipidemia, type 2 diabetes, hypokalemia, depression, rheumatoid arthritis, and a stage Ia adenocarcinoma of the left upper lobe.    Stage Ia adenocarcinoma left upper lobe-she saw Dr. Ellin Saba.  Scheduled for follow-up with a CT scan at 6 months.  Status post left upper lobe segmentectomy-doing well from a respiratory standpoint but does still have some incisional pain.  Has been using oxycodone at night.  Uses ibuprofen during the day.  Tramadol was ineffective.  Will discontinue that medication.  I gave her a new prescription for oxycodone 5 mg p.o. every 6 hours as needed, 21 tablets, no refills.  Hopefully she will be able to get off narcotics altogether by the time she has used those.  There are no restrictions on her activities.  Plan: Return in 4 months after CT with Dr. Faythe Dingwall, MD [facility] [phone]

## 2023-07-08 ENCOUNTER — Other Ambulatory Visit: Payer: Self-pay | Admitting: Thoracic Surgery (Cardiothoracic Vascular Surgery)

## 2023-07-08 MED ORDER — OXYCODONE HCL 5 MG PO TABS: 5.0000 mg | ORAL_TABLET | Freq: Four times a day (QID) | ORAL | 0 refills | Status: DC | PRN

## 2023-07-08 NOTE — Progress Notes (Signed)
Refilled oxycodone 5 mg, 1 PO Q6 PRN, # 21, 0 refill  Jakari Jacot C. Dorris Fetch, MD [facility] [phone]

## 2023-07-09 ENCOUNTER — Other Ambulatory Visit: Payer: Self-pay | Admitting: Physician Assistant

## 2023-07-18 ENCOUNTER — Other Ambulatory Visit: Payer: Self-pay | Admitting: Thoracic Surgery (Cardiothoracic Vascular Surgery)

## 2023-07-18 MED ORDER — OXYCODONE HCL 5 MG PO TABS: 5.0000 mg | ORAL_TABLET | Freq: Two times a day (BID) | ORAL | 0 refills | Status: DC | PRN

## 2023-07-18 NOTE — Progress Notes (Unsigned)
      [address]       [name] [address]             [phone]      Prescription for oxycodone 5 mg BID PRN, # 14, no refills  Sonia Bromell C. Dorris Fetch, MD [facility] [phone]

## 2023-07-29 ENCOUNTER — Other Ambulatory Visit: Payer: Self-pay | Admitting: Thoracic Surgery (Cardiothoracic Vascular Surgery)

## 2023-07-29 MED ORDER — OXYCODONE HCL 5 MG PO TABS: 5.0000 mg | ORAL_TABLET | Freq: Two times a day (BID) | ORAL | 0 refills | Status: DC | PRN

## 2023-07-29 NOTE — Progress Notes (Signed)
Refill oxycodone 5 mg PO BID PRN # 14, no refills  Deward Sebek C. Dorris Fetch, MD [facility] [phone]

## 2023-07-30 ENCOUNTER — Telehealth: Payer: Self-pay

## 2023-07-30 NOTE — Telephone Encounter (Signed)
-----   Message from [name] sent at 07/29/2023  5:45 PM EST ----- Regarding: RE: pain med refill I refilled it. Let her know she needs to get off that med and will have to come for an appointment to get refilled  [facility] ----- Message ----- From: Joycelyn Schmid, LPN Sent: 40/98/1191  12:12 PM EST To: [name], MD Subject: pain med refill                                Patient is call for refill on Oxycodone 5 mg RX. Please send to pharm noted in Epic. Thanks

## 2023-08-02 ENCOUNTER — Other Ambulatory Visit: Payer: Self-pay | Admitting: Physician Assistant

## 2023-08-02 MED ORDER — PREDNISONE 5 MG PO TABS: ORAL_TABLET | ORAL | 0 refills | Status: DC

## 2023-08-02 NOTE — Progress Notes (Signed)
Patient contacted the on-call service and I returned her call to discuss the flare she is currently experiencing.  She states on Wednesday she started to have pain and inflammation in both shoulders and both hands.  She remains on her current treatment regimen without gaps in therapy. She is taking prednisone 5 mg daily.  Plan to increase prednisone to 20 mg tapering by 5 mg every 4 days then she will resume 5 mg daily.  She was in agreement and understands the tapering schedule. She will notify us if her symptoms persist or worsen.    Sherron Ales, PA-C

## 2023-08-07 DIAGNOSIS — M069 Rheumatoid arthritis, unspecified: Secondary | ICD-10-CM | POA: Diagnosis not present

## 2023-08-15 DIAGNOSIS — R350 Frequency of micturition: Secondary | ICD-10-CM | POA: Diagnosis not present

## 2023-08-16 ENCOUNTER — Other Ambulatory Visit: Payer: Self-pay | Admitting: Physician Assistant

## 2023-08-19 ENCOUNTER — Other Ambulatory Visit: Payer: Self-pay | Admitting: *Deleted

## 2023-08-19 MED ORDER — PREDNISONE 5 MG PO TABS: 5.0000 mg | ORAL_TABLET | Freq: Every day | ORAL | 0 refills | Status: DC

## 2023-08-19 NOTE — Telephone Encounter (Signed)
Last Fill: 08/02/2023 taper  Next Visit: 09/18/2023  Last Visit: 06/11/2023  Dx: Rheumatoid arthritis with rheumatoid factor of multiple sites without organ or systems involvement   Current Dose per office note on 06/11/2023: resume 5 mg daily maintenance dose.   Okay to refill Prednisone?

## 2023-08-29 ENCOUNTER — Telehealth: Payer: Self-pay

## 2023-08-29 NOTE — Telephone Encounter (Signed)
Patient contacted the office and state she was trying to get a refill of her medication but was told she needs to renew her application for the function. Patient states she has to renew what pays for the Humira. Inquired if the patient could come to the office to fill out the patient assistance application. Patient states she can come to the office tomorrow to fill out the application. Advised the patient to bring any documentation and tax forms she would need to fill out the forms.

## 2023-08-29 NOTE — Telephone Encounter (Signed)
Printed Humira PAP application for patient to complete (first page) and second page (for provider to sign)  Christina Jenkins, PharmD, MPH, BCPS, CPP Clinical Pharmacist (Rheumatology and Pulmonology)

## 2023-08-30 ENCOUNTER — Telehealth: Payer: Self-pay | Admitting: Pharmacist

## 2023-08-30 ENCOUNTER — Encounter: Payer: Self-pay | Admitting: Pharmacist

## 2023-08-30 NOTE — Telephone Encounter (Signed)
Error

## 2023-08-30 NOTE — Telephone Encounter (Signed)
Received notification from [facility] regarding a prior authorization for HUMIRA. Authorization has been APPROVED from 08/30/2023 to 09/02/2024. Approval letter sent to scan center.  Authorization # [id number]  Submitted Patient Assistance RENEWAL Application to [employer] for HUMIRA along with provider portion, patient portion, PA, medication list, insurance card copy and income documents. Will update patient when we receive a response.  Phone: [phone] Fax: [phone]  Chesley Mires, PharmD, MPH, BCPS, CPP Clinical Pharmacist (Rheumatology and Pulmonology)

## 2023-08-30 NOTE — Telephone Encounter (Signed)
Received signed patient and provider forms.   Submitted a Prior Authorization request to [facility] for HUMIRA via CoverMyMeds. Will update once we receive a response.  Key: Christina Jenkins

## 2023-08-30 NOTE — Telephone Encounter (Signed)
Patient came to the office today to complete her portion and Christina Jenkins has completed the provider portion. Faxed over to [facility] for review.

## 2023-08-31 DIAGNOSIS — N390 Urinary tract infection, site not specified: Secondary | ICD-10-CM | POA: Diagnosis not present

## 2023-08-31 DIAGNOSIS — R300 Dysuria: Secondary | ICD-10-CM | POA: Diagnosis not present

## 2023-09-04 NOTE — Progress Notes (Signed)
 Office Visit Note  Patient: Christina Jenkins             Date of Birth: 1960-09-24           MRN: [id number]             PCP: Anthonette Bastos, PA-C Referring: Anthonette Bastos, PA-C Visit Date: 09/18/2023 Occupation: @GUAROCC @  Subjective:  Recurrent flares   History of Present Illness: Christina Jenkins is a 63 y.o. female with history of seropositive rheumatoid arthritis and osteoarthritis.  Patient remains on Humira  40 mg subcutaneous injections every 14 days and Arava  20 mg 1 tablet by mouth daily.  She remains on Prednisone  5 mg daily.  Patient reports that she was due for her last dose of Humira  on Monday but has not yet received a new shipment.  She has not heard back yet if she has been approved for patient assistance for Humira  for 2025.  Patient reports that she has been trying to get over a UTI since around Christmas time.  She has required a couple rounds of antibiotics and finished a course of Augmentin  yesterday.  She has not needed to take Azo for the past 4 days and her symptoms seem to have resolved.  Patient states that she did not hold arava  or Humira  during the infection.  Patient continues to have recurrent flares involving multiple joints.  Her flares have been migratory but she has had recurrent extensor tenosynovitis involving both wrists alternating between her right and left.  Her most recent flare was last week involving the left wrist at which time she had a golf ball sized area of inflammation lasting for 3 to 4 days.  Patient is open to discuss treatment options.  She has not found Humira  to be effective at managing her arthritis.   Activities of Daily Living:  Patient reports morning stiffness for 1 hour.   Patient Denies nocturnal pain.  Difficulty dressing/grooming: Denies Difficulty climbing stairs: Denies Difficulty getting out of chair: Denies Difficulty using hands for taps, buttons, cutlery, and/or writing: Reports  Review of Systems  Constitutional:  Negative  for fatigue.  HENT:  Negative for mouth sores and mouth dryness.   Eyes:  Negative for dryness.  Respiratory:  Negative for shortness of breath.   Cardiovascular:  Negative for chest pain and palpitations.  Gastrointestinal:  Negative for blood in stool, constipation and diarrhea.  Endocrine: Negative for increased urination.  Genitourinary:  Negative for involuntary urination.  Musculoskeletal:  Positive for joint pain, joint pain, joint swelling and morning stiffness. Negative for gait problem, myalgias, muscle weakness, muscle tenderness and myalgias.  Skin:  Negative for color change, rash, hair loss and sensitivity to sunlight.  Allergic/Immunologic: Negative for susceptible to infections.  Neurological:  Negative for dizziness and headaches.  Hematological:  Negative for swollen glands.  Psychiatric/Behavioral:  Negative for depressed mood and sleep disturbance. The patient is not nervous/anxious.     PMFS History:  Patient Active Problem List   Diagnosis Date Noted   Primary adenocarcinoma of upper lobe of left lung (HCC) 06/02/2023   S/P partial lobectomy of lung 05/03/2023   Solitary pulmonary nodule on lung CT 03/02/2023   COPD GOLD ? / group A 03/02/2023   Lung nodule 03/02/2023   Chest pain of uncertain etiology 04/30/2019   Abnormal nuclear stress test    Circulatory system disorder 04/18/2019   Ischemia 04/18/2019   Hyperglycemia, unspecified 12/30/2018   Other abnormal glucose 12/30/2018   History  of COPD 05/31/2017   Mixed hyperlipidemia 04/30/2017   Fatigue 12/04/2016   History of seizures 10/23/2016   History of hepatitis C 10/23/2016   ANA positive 10/23/2016   Sicca syndrome (HCC) 10/23/2016   Venous (peripheral) insufficiency 10/08/2016   Varicose veins of lower extremities with inflammation 10/08/2016   Seropositive rheumatoid arthritis (HCC) 08/01/2016   High risk medication use 08/01/2016   Hepatitis C 08/01/2016   Cigarette smoker 08/01/2016    Depression 08/01/2016   Complex partial seizure (HCC) 02/02/2016   MCI (mild cognitive impairment) 11/02/2015   Arthralgia 11/02/2015   Carpal tunnel syndrome 10/20/2015   Syncope 05/17/2015   Convulsions/seizures (HCC) 05/17/2015   Tobacco use disorder 02/23/2014   Edema 09/22/2013   Osteoarthrosis 06/11/2011   Hypertension 12/20/2010   Hypokalemia 12/20/2010    Past Medical History:  Diagnosis Date   Adenocarcinoma of upper lobe of left lung (HCC)    Carpal tunnel syndrome    COPD (chronic obstructive pulmonary disease) (HCC)    Coronary artery calcification seen on CT scan    Normal coronaries at cardiac catheterization 2020   Depression    Essential hypertension    Hepatitis C    Hypokalemia    Mixed hyperlipidemia    Rheumatoid arthritis (HCC)    Rheumatoid arthritis (HCC)    Seasonal allergies    Type 2 diabetes mellitus (HCC)    Varicose veins of legs     Family History  Problem Relation Age of Onset   Diabetes Mother    Pancreatic cancer Mother    Diabetes Father    COPD Father    Dementia Father    Diabetes Brother    Past Surgical History:  Procedure Laterality Date   BACK SURGERY     BRONCHIAL BIOPSY  04/02/2023   Procedure: BRONCHIAL BIOPSIES;  Surgeon: Prudy Brownie, DO;  Location: [facility];  Service: Pulmonary;;   BRONCHIAL BRUSHINGS  04/02/2023   Procedure: BRONCHIAL BRUSHINGS;  Surgeon: Prudy Brownie, DO;  Location: [facility];  Service: Pulmonary;;   BRONCHIAL NEEDLE ASPIRATION BIOPSY  04/02/2023   Procedure: BRONCHIAL NEEDLE ASPIRATION BIOPSIES;  Surgeon: Prudy Brownie, DO;  Location: [facility];  Service: Pulmonary;;   FIDUCIAL MARKER PLACEMENT  04/02/2023   Procedure: FIDUCIAL MARKER PLACEMENT;  Surgeon: Prudy Brownie, DO;  Location: [facility];  Service: Pulmonary;;   INTERCOSTAL NERVE BLOCK Left 05/03/2023   Procedure: INTERCOSTAL NERVE BLOCK;  Surgeon: Zelphia Higashi, MD;  Location: [facility];  Service: Thoracic;   Laterality: Left;   LEFT HEART CATH AND CORONARY ANGIOGRAPHY N/A 04/30/2019   Procedure: LEFT HEART CATH AND CORONARY ANGIOGRAPHY;  Surgeon: Avanell Leigh, MD;  Location: [facility];  Service: Cardiovascular;  Laterality: N/A;   NODE DISSECTION Left 05/03/2023   Procedure: NODE DISSECTION;  Surgeon: Zelphia Higashi, MD;  Location: [facility];  Service: Thoracic;  Laterality: Left;   TONSILLECTOMY     TUBAL LIGATION     Social History   Social History Narrative   1 biological child, 1 stepchild   Immunization History  Administered Date(s) Administered   Influenza,inj,Quad PF,6+ Mos 05/16/2017, 05/15/2018, 05/09/2019   Influenza-Unspecified 05/30/2023   Moderna Sars-Covid-2 Vaccination 11/16/2019, 12/14/2019, 08/11/2020, 05/30/2023   Tdap 06/26/2018   Zoster Recombinant(Shingrix) 09/04/2017     Objective: Vital Signs: BP 105/64 (BP Location: Left Arm, Patient Position: Sitting, Cuff Size: Normal)   Pulse 98   Resp 14   Ht 5\' 6"  (1.676 m)   Wt 172  lb (78 kg)   BMI 27.76 kg/m    Physical Exam Vitals and nursing note reviewed.  Constitutional:      Appearance: She is well-developed.  HENT:     Head: Normocephalic and atraumatic.  Eyes:     Conjunctiva/sclera: Conjunctivae normal.  Cardiovascular:     Rate and Rhythm: Normal rate and regular rhythm.     Heart sounds: Normal heart sounds.  Pulmonary:     Effort: Pulmonary effort is normal.     Breath sounds: Normal breath sounds.  Abdominal:     General: Bowel sounds are normal.     Palpations: Abdomen is soft.  Musculoskeletal:     Cervical back: Normal range of motion.  Lymphadenopathy:     Cervical: No cervical adenopathy.  Skin:    General: Skin is warm and dry.     Capillary Refill: Capillary refill takes less than 2 seconds.  Neurological:     Mental Status: She is alert and oriented to person, place, and time.  Psychiatric:        Behavior: Behavior normal.      Musculoskeletal Exam: C-spine  has limited range of motion with lateral Tatian.  Painful and limited range of motion of both shoulder joints noted.  Elbow joints have good range of motion.  Some tenderness over both wrist joints especially the left wrist today.  No tenderness or synovitis over MCP joints.  Complete fist formation bilaterally.  Hip joints have good range of motion with no groin pain.  Knee joints have good range of motion no warmth or effusion.  Ankle joints have good range of motion with no tenderness or joint swelling.  CDAI Exam: CDAI Score: -- Patient Global: --; Provider Global: -- Swollen: --; Tender: -- Joint Exam 09/18/2023   No joint exam has been documented for this visit   There is currently no information documented on the homunculus. Go to the Rheumatology activity and complete the homunculus joint exam.  Investigation: No additional findings.  Imaging: No results found.  Recent Labs: Lab Results  Component Value Date   WBC 12.2 (H) 05/06/2023   HGB 11.6 (L) 05/06/2023   PLT 146 (L) 05/06/2023   NA 136 05/06/2023   K 3.5 05/06/2023   CL 103 05/06/2023   CO2 26 05/06/2023   GLUCOSE 119 (H) 05/06/2023   BUN 10 05/06/2023   CREATININE 0.71 05/06/2023   BILITOT 0.6 05/05/2023   ALKPHOS 67 05/05/2023   AST 19 05/05/2023   ALT 14 05/05/2023   PROT 5.5 (L) 05/05/2023   ALBUMIN 2.4 (L) 05/05/2023   CALCIUM  8.2 (L) 05/06/2023   GFRAA 91 12/09/2019   QFTBGOLDPLUS NEGATIVE 12/09/2019    Speciality Comments: Need Hep C quant every 6 months Prior therapy: methotrexate (oral ulcers) and Cimzia  (inadequate response) , Enbrel  stopped Jan 2023 Humira  started 09/27/21  TB Gold: Negative 01/21/2023  Procedures:  No procedures performed Allergies: Patient has no known allergies.   Assessment / Plan:     Visit Diagnoses: Rheumatoid arthritis with rheumatoid factor of multiple sites without organ or systems involvement [facility]): Present today experiencing recurrent flares involving multiple  joints.  Most recently she has been experiencing recurrent flares of extensor tenosynovitis alternating between both wrists.  Her most recent flare was last week involving the left wrist at which time she had a golf ball sized area of inflammation lasting for 3 to 4 days.  She has also had intermittent discomfort in both shoulders.  Patient is currently on  Humira  40 mg subcutaneous injections every 14 days and Arava  20 mg 1 tablet by mouth daily.  She also remains on prednisone  5 mg daily.  Patient has had recurrent flares despite not having any recent gaps in therapy.  She does not find Humira  to be effective at managing her symptoms.  She is open to discuss other treatment options today.  Previous therapy includes Enbrel , methotrexate, Actemra , and Cimzia .  She is not a good candidate for Orencia given history of COPD.  Different treatment options were discussed today in detail.  Indications, contraindications, potential side effects of Kevzara were discussed today in detail.  All questions were addressed and consent was obtained.  Plan to apply for Kevzara through her insurance and once approved to return to the office for administration of the first injection.  She will remain on Arava  and prednisone  as combination therapy.  She is aware of the risks of long-term prednisone  use.  She will require updated CBC and CMP 1 month then every 3 months after initiating Kevzara.  In the meantime she is overdue for her a dose of Humira  so sample was provided today in the office which she plans on administering today.  She will be eligible to initiate Kevzara in 14 days if approved by insurance. She will follow up in the office in 2-3 months.   Medication counseling:  TB Test: Negative on 01/21/2023 Hepatitis panel: Hepatitis C quant negative on 09/16/2023 HIV: Negative on 12/28/2015 Lipid panel: Plan to call PCP for updated lipid panel.  Chest x-ray: Chest x-ray updated on 07/02/2023.  Patient is scheduled for a chest  CT on 10/30/2023.  Counseled patient that Kevzara is an IL-6 blocking agent.  Counseled patient on purpose, proper use, and adverse effects of Kevzara.  Reviewed the most common adverse effects including infections, injection site reaction, bowel injury, and rarely cancer.  Reviewed that the medication should be held during infections.  Discussed that there is the possibility of an increased risk of malignancy but it is not well understood if this increased risk is due to the medication or the disease state.  Reviewed the importance of regular labs while on Kevzara therapy including the need for routine lipid panel.  Advised patient to get standing labs one month after starting Kevzara.  Provided patient with standing lab orders.  Counseled patient that Kevzara should be held prior to scheduled surgery.  Counseled patient to avoid live vaccines while on Kevzara.  Advised patient to get annual influenza vaccine and the pneumococcal vaccine.  Provided patient with medication education material and answered all questions.  Patient voiced understanding.  Patient consented to Kevzara.  Will upload consent into the media tab.  Reviewed storage instructions of Kevzara with patient.  Advised it should be kept in the refrigerator until she is ready for use.  Advised patient that initial Kevzara injection must be given in the office.  Will apply for Kevzara through patient's insurance.    High risk medication use - Humira  40 mg subcutaneous injections every 14 days and Arava  20 mg 1 tablet by mouth daily.  She is taking Prednisone  5 mg daily--she is aware of the risks of long term prednisone  use. Humira  was started on 09/27/2021.  Previous therapy: Enbrel , cimzia , Actemra , methotrexate.  CBC and CMP updated on 09/16/23--white blood cell count was 7.8, red blood cell count 4.57, hemoglobin 12.8, hematocrit 40.4, creatinine 0.70, GFR 98, AST 17, ALT 14, hepatitis C quant not detected.  We will call to obtain  updated lipid  panel from PCP.  She is currently on Crestor  as prescribed.  Lipid panel will be monitored closely after switching to kevzara.  TB gold negative on 01/21/23.   Discussed the importance of holding Kevzara and arava  if she develops signs or symptoms of an infection and to resume once the infection has completely cleared.    Not a good candidate for Orencia given history of COPD-unsure of the severity of COPD.  Scheduled for chest CT on 10/30/2023.   Chronic pain of both shoulders: Patient continues to experience intermittent pain and stiffness involving both shoulders.  Primary osteoarthritis of both hands: Patient has been having ongoing pain and stiffness involving both hands and both wrist joints.  She has been experiencing recurrent episodes of extensor tenosynovitis involving both wrists.  Primary osteoarthritis of both feet: She experiences intermittent discomfort in her feet.  She is wearing proper fitting shoes at this time.  Trochanteric bursitis of both hips: Intermittent discomfort.  ANA positive: No clinical features of systemic lupus at this time.  Other medical conditions are listed as follows:   History of hepatitis C: HCV quant nonreactive on 11/03/21. Hepatitis C antibody negative on 02/12/22. HCV antibody negative on 10/10/22. Hep C quant not detected on 09/16/2023.  History of COPD: Not a good candidate for orencia.   History of seizures  Long term (current) use of systemic steroids  Former smoker  Orders: No orders of the defined types were placed in this encounter.  No orders of the defined types were placed in this encounter.    Follow-Up Instructions: Return in 3 months (on 12/17/2023) for Rheumatoid arthritis.   Romayne Clubs, PA-C  Note - This record has been created using Dragon software.  Chart creation errors have been sought, but may not always  have been located. Such creation errors do not reflect on  the standard of medical care.

## 2023-09-09 ENCOUNTER — Telehealth: Payer: Self-pay | Admitting: *Deleted

## 2023-09-09 DIAGNOSIS — Z79899 Other long term (current) drug therapy: Secondary | ICD-10-CM

## 2023-09-09 NOTE — Telephone Encounter (Signed)
 Patient contacted the office and left message requesting a call back regarding her application for patient assistance. Patient wanted to know if we have heard anything. Patient also wanted to know if she is due for labs.   Returned call to patient to advised we have not heard anything as of yet. Patient advised she is also due for labs. Patient requested the orders to be faxed to [facility].

## 2023-09-11 DIAGNOSIS — N3090 Cystitis, unspecified without hematuria: Secondary | ICD-10-CM | POA: Diagnosis not present

## 2023-09-11 DIAGNOSIS — R300 Dysuria: Secondary | ICD-10-CM | POA: Diagnosis not present

## 2023-09-12 ENCOUNTER — Ambulatory Visit: Payer: Medicare Other | Admitting: Physician Assistant

## 2023-09-16 ENCOUNTER — Ambulatory Visit: Payer: Medicare Other | Admitting: Physician Assistant

## 2023-09-16 DIAGNOSIS — R5383 Other fatigue: Secondary | ICD-10-CM | POA: Diagnosis not present

## 2023-09-16 DIAGNOSIS — Z79899 Other long term (current) drug therapy: Secondary | ICD-10-CM | POA: Diagnosis not present

## 2023-09-16 DIAGNOSIS — M0579 Rheumatoid arthritis with rheumatoid factor of multiple sites without organ or systems involvement: Secondary | ICD-10-CM | POA: Diagnosis not present

## 2023-09-16 DIAGNOSIS — Z8619 Personal history of other infectious and parasitic diseases: Secondary | ICD-10-CM | POA: Diagnosis not present

## 2023-09-18 ENCOUNTER — Ambulatory Visit: Payer: Medicare Other | Attending: Physician Assistant | Admitting: Physician Assistant

## 2023-09-18 ENCOUNTER — Telehealth: Payer: Self-pay | Admitting: *Deleted

## 2023-09-18 ENCOUNTER — Encounter: Payer: Self-pay | Admitting: Physician Assistant

## 2023-09-18 VITALS — BP 105/64 | HR 98 | Resp 14 | Ht 66.0 in | Wt 172.0 lb

## 2023-09-18 DIAGNOSIS — M19041 Primary osteoarthritis, right hand: Secondary | ICD-10-CM

## 2023-09-18 DIAGNOSIS — Z8619 Personal history of other infectious and parasitic diseases: Secondary | ICD-10-CM

## 2023-09-18 DIAGNOSIS — M25512 Pain in left shoulder: Secondary | ICD-10-CM

## 2023-09-18 DIAGNOSIS — Z7952 Long term (current) use of systemic steroids: Secondary | ICD-10-CM

## 2023-09-18 DIAGNOSIS — M25511 Pain in right shoulder: Secondary | ICD-10-CM | POA: Diagnosis not present

## 2023-09-18 DIAGNOSIS — M19072 Primary osteoarthritis, left ankle and foot: Secondary | ICD-10-CM

## 2023-09-18 DIAGNOSIS — M7061 Trochanteric bursitis, right hip: Secondary | ICD-10-CM

## 2023-09-18 DIAGNOSIS — Z87891 Personal history of nicotine dependence: Secondary | ICD-10-CM | POA: Diagnosis not present

## 2023-09-18 DIAGNOSIS — G8929 Other chronic pain: Secondary | ICD-10-CM

## 2023-09-18 DIAGNOSIS — M0579 Rheumatoid arthritis with rheumatoid factor of multiple sites without organ or systems involvement: Secondary | ICD-10-CM

## 2023-09-18 DIAGNOSIS — F17200 Nicotine dependence, unspecified, uncomplicated: Secondary | ICD-10-CM

## 2023-09-18 DIAGNOSIS — R768 Other specified abnormal immunological findings in serum: Secondary | ICD-10-CM

## 2023-09-18 DIAGNOSIS — Z8709 Personal history of other diseases of the respiratory system: Secondary | ICD-10-CM

## 2023-09-18 DIAGNOSIS — Z79899 Other long term (current) drug therapy: Secondary | ICD-10-CM | POA: Diagnosis not present

## 2023-09-18 DIAGNOSIS — M19071 Primary osteoarthritis, right ankle and foot: Secondary | ICD-10-CM | POA: Diagnosis not present

## 2023-09-18 DIAGNOSIS — Z87898 Personal history of other specified conditions: Secondary | ICD-10-CM

## 2023-09-18 DIAGNOSIS — M19042 Primary osteoarthritis, left hand: Secondary | ICD-10-CM

## 2023-09-18 DIAGNOSIS — M7062 Trochanteric bursitis, left hip: Secondary | ICD-10-CM

## 2023-09-18 LAB — LAB REPORT - SCANNED
EGFR: 98
HM Hepatitis Screen: NEGATIVE

## 2023-09-18 NOTE — Patient Instructions (Addendum)
 Sarilumab Injection What is this medication? SARILUMAB (sar IL ue mab) treats autoimmune conditions, such as arthritis. It works by slowing down an overactive immune system. It is a monoclonal antibody. This medicine may be used for other purposes; ask your health care provider or pharmacist if you have questions. COMMON BRAND NAME(S): KEVZARA What should I tell my care team before I take this medication? They need to know if you have any of these conditions: Cancer Diabetes Diverticulitis Hepatitis B or history of hepatitis B infection High cholesterol Immune system problems Infection, especially a viral infection, such as chickenpox, cold sores, herpes Liver disease Low blood cell levels (white cells, platelets, or red blood cells) Recent or upcoming vaccine Scheduled to have surgery Stomach or intestine problems Tuberculosis, a positive skin test for tuberculosis, or recent close contact with someone who has tuberculosis An unusual or allergic reaction to sarilumab, other medications, foods, dyes, or preservatives Pregnant or trying to get pregnant Breast-feeding How should I use this medication? This medication is injected under the skin. You will be taught how to prepare and give it. Take it as directed on the prescription label at the same time every day. Keep taking it unless your care team tells you to stop. It is important that you put your used needles and syringes in a special sharps container. Do not put them in a trash can. If you do not have a sharps container, call your pharmacist or care team to get one. A special MedGuide will be given to you by the pharmacist with each prescription and refill. Be sure to read this information carefully each time. Talk to your care team about the use of this medication in children. Special care may be needed. Overdosage: If you think you have taken too much of this medicine contact a poison control center or emergency room at once. NOTE:  This medicine is only for you. Do not share this medicine with others. What if I miss a dose? If you miss a dose, take it as soon as you can. If it is almost time for your next dose, take only that dose. Do not take double or extra doses. What may interact with this medication? Do not take this medication with any of the following: Live virus vaccines This medication may also interact with the following: Biologic medications, such as abatacept, adalimumab , anakinra, certolizumab, etanercept , golimumab, infliximab, ofatumumab, rituximab, secukinumab, tocilizumab , tofacitinib, ustekinumab This medication may affect how other medications work. Talk with your care team about all the medications you take. They may suggest changes to your treatment plan to lower the risk of side effects and to make sure your medications work as intended. This list may not describe all possible interactions. Give your health care provider a list of all the medicines, herbs, non-prescription drugs, or dietary supplements you use. Also tell them if you smoke, drink alcohol, or use illegal drugs. Some items may interact with your medicine. What should I watch for while using this medication? Visit your care team for regular checks on your progress. Tell your care team if your symptoms do not start to get better or if they get worse. You may need blood work done while you are taking this medication. This medication can increase your risk of getting an infection. Call your care team for advice if you get a fever, chills, sore throat, or other symptoms of a cold or flu. Do not treat yourself. Try to avoid being around people who are sick. If  you have not had the measles or chickenpox vaccines, tell your care team right away if you are around someone with these viruses. If you are going to need surgery or other procedure, tell your care team that you are using this medication. Talk to your care team about your risk of cancer. You  may be at risk for certain types of cancers if you take this medication. What side effects may I notice from receiving this medication? Side effects that you should report to your care team as soon as possible: Allergic reactions--skin rash, itching, hives, swelling of the face, lips, tongue, or throat Infection--fever, chills, cough, sore throat, wounds that don't heal, pain or trouble when passing urine, general feeling of discomfort or being unwell Stomach pain that is severe, does not go away, or gets worse Unusual bruising or bleeding Side effects that usually do not require medical attention (report to your care team if they continue or are bothersome): Pain, redness, or irritation at injection site Runny or stuffy nose Sore throat This list may not describe all possible side effects. Call your doctor for medical advice about side effects. You may report side effects to FDA at 1-800-FDA-1088. Where should I keep my medication? Keep out of the reach of children and pets. Store in a refrigerator or at room temperature between 20 and 25 degrees C (68 and 77 degrees F). Refrigeration (preferred): Store in the refrigerator. Do not freeze. Protect from light. Keep in the original container until you are ready to take it. Get rid of any unused medication after the expiration date. Room temperature: This medication may be stored at room temperature for up to 14 days. Protect from light. Keep it in the original carton until you are ready to take it. Get rid of any unused medication after 14 days or after it expires, whichever is first. To get rid of medications that are no longer needed or have expired: Take the medication to a medication take-back program. Ask your pharmacy or law enforcement to find a location. If you cannot return the medication, ask your pharmacist or care team how to get rid of this medication safely. NOTE: This sheet is a summary. It may not cover all possible information. If  you have questions about this medicine, talk to your doctor, pharmacist, or health care provider.  2024 Elsevier/Gold Standard (2021-11-06 00:00:00) Standing Labs We placed an order today for your standing lab work.   Please have your standing labs drawn in 1 month then every 3 months   Please have your labs drawn 2 weeks prior to your appointment so that the provider can discuss your lab results at your appointment, if possible.  Please note that you may see your imaging and lab results in MyChart before we have reviewed them. We will contact you once all results are reviewed. Please allow our office up to 72 hours to thoroughly review all of the results before contacting the office for clarification of your results.  WALK-IN LAB HOURS  Monday through Thursday from 8:00 am -12:30 pm and 1:00 pm-5:00 pm and Friday from 8:00 am-12:00 pm.  Patients with office visits requiring labs will be seen before walk-in labs.  You may encounter longer than normal wait times. Please allow additional time. Wait times may be shorter on  Monday and Thursday afternoons.  We do not book appointments for walk-in labs. We appreciate your patience and understanding with our staff.   Labs are drawn by [facility]. Please bring your  co-pay at the time of your lab draw.  You may receive a bill from [facility] for your lab work.  Please note if you are on Hydroxychloroquine  and and an order has been placed for a Hydroxychloroquine  level,  you will need to have it drawn 4 hours or more after your last dose.  If you wish to have your labs drawn at another location, please call the office 24 hours in advance so we can fax the orders.  The office is located at [address], [address], [place], [place] [address]   If you have any questions regarding directions or hours of operation,  please call [phone].   As a reminder, please drink plenty of water prior to coming for your lab work. Thanks!

## 2023-09-18 NOTE — Progress Notes (Signed)
 Medication Samples have been provided to the patient.  Drug name: Humira        Strength: 40 mg        Qty: 1  LOT: [id number]  Exp.Date: 06/2024  Dosing instructions: Inject one pen into skin once every 14 days.

## 2023-09-18 NOTE — Telephone Encounter (Signed)
 Labs received from:[facility]  Drawn on:09/16/2023  Reviewed by: Jacinta Martinis, PA-C  Labs drawn: CBC, CMP, HCV , Quant, Sed Rate  Results: Glucose 102    Albumin 3.7

## 2023-09-19 ENCOUNTER — Telehealth: Payer: Self-pay | Admitting: Pharmacist

## 2023-09-19 ENCOUNTER — Other Ambulatory Visit (HOSPITAL_COMMUNITY): Payer: Self-pay

## 2023-09-19 DIAGNOSIS — M0579 Rheumatoid arthritis with rheumatoid factor of multiple sites without organ or systems involvement: Secondary | ICD-10-CM

## 2023-09-19 NOTE — Telephone Encounter (Signed)
Pt switching to [employer]. Closing encounter

## 2023-09-19 NOTE — Telephone Encounter (Addendum)
Submitted a Prior Authorization request to [facility] for [facility] via CoverMyMeds. Will update once we receive a response.  Key: [id number]   Failed Humira Failed Enbrel MTX- caused oral ulcers Unable to take JAK due to history of ischemia and adenocarcinoma of lung as well as smoking status (increased risk of CV event) Orencia is not preferred due to her history of COPD  ----- Message from Nurse Richardo Priest sent at 09/18/2023  1:25 PM EST ----- Per Sherron Ales, PA-C, please apply for [facility]. Thanks!

## 2023-09-26 ENCOUNTER — Other Ambulatory Visit (HOSPITAL_COMMUNITY): Payer: Self-pay

## 2023-09-26 NOTE — Telephone Encounter (Signed)
Received notification from [facility] regarding a prior authorization for [facility]. Authorization has been APPROVED from 09/19/2023 to 09/02/2024. Approval letter sent to scan center.  Per test claim, copay for 28 days supply is $1,565.09  Authorization # [id number] Phone # [phone]  Spoke with patient about [employer] patient assistance application. She requested application be mailed to her home (address on file). Provider form will be completed once she returns her portion  Chesley Mires, PharmD, MPH, BCPS, CPP Clinical Pharmacist (Rheumatology and Pulmonology)

## 2023-09-27 DIAGNOSIS — R300 Dysuria: Secondary | ICD-10-CM | POA: Diagnosis not present

## 2023-09-27 DIAGNOSIS — R350 Frequency of micturition: Secondary | ICD-10-CM | POA: Diagnosis not present

## 2023-10-01 ENCOUNTER — Telehealth: Payer: Self-pay | Admitting: *Deleted

## 2023-10-01 NOTE — Telephone Encounter (Signed)
Patient assistance application placed up front for patient in file cabinet  Chesley Mires, PharmD, MPH, BCPS, CPP Clinical Pharmacist (Rheumatology and Pulmonology)

## 2023-10-01 NOTE — Telephone Encounter (Signed)
Patient contacted the office stating she had her last Humira injection about 2 weeks ago. Patient states she has not gotten the patient assistance application form to complete for [employer]. Patient advised she could come to the office to complete if she would like. Patient also states she is on  her 4th round on antibiotics for a UTI. Patient advised she would need to hold her medication until she completes the antibiotic and her infection has resolved.

## 2023-10-03 NOTE — Telephone Encounter (Signed)
Patient called office, patient requesting paperwork faxed to PCP office, [facility] faxed blank form for patient to complete, sign and refax.

## 2023-10-04 ENCOUNTER — Telehealth: Payer: Self-pay | Admitting: *Deleted

## 2023-10-04 NOTE — Telephone Encounter (Signed)
Patient contacted the office to see if her patient assistance application has been received. Please advise patient.

## 2023-10-07 ENCOUNTER — Telehealth: Payer: Self-pay

## 2023-10-07 NOTE — Telephone Encounter (Signed)
Received form from patient. Prescriber form placed in Christina Ales, PA-C's folder for signature

## 2023-10-07 NOTE — Telephone Encounter (Signed)
Received. Prescriber form placed in Sherron Ales, PA-C's folder for signature  Chesley Mires, PharmD, MPH, BCPS, CPP Clinical Pharmacist (Rheumatology and Pulmonology)

## 2023-10-07 NOTE — Telephone Encounter (Signed)
Patient contacted the office and inquires if a Prednisone taper can be sent in for her. Patient states she is currently on antibiotics for a UTI and cannot take her arthritis medication. Patient states she is in pain and her hands are swollen. Patient states she does take a regular 5 mg Prednisone dose daily but that is not enough. Patient states she would like a taper sent to [facility] in [place]. Please advise.

## 2023-10-08 MED ORDER — PREDNISONE 5 MG PO TABS: ORAL_TABLET | ORAL | 0 refills | Status: DC

## 2023-10-08 NOTE — Telephone Encounter (Signed)
 Received signed provder form from Waddell Craze, PA-C  Submitted Patient Assistance Application to  [employer]  for [employer]  along with provider portion, patient portion, PA, medication list, insurance card copy. Will update patient when we receive a response.  Phone: [phone] Fax: [phone]

## 2023-10-08 NOTE — Telephone Encounter (Signed)
Ok to send in prednisone 20 mg tapering by 5 mg every 2 day---then continue maintenance dose of prednisone 5 mg daily.

## 2023-10-08 NOTE — Telephone Encounter (Signed)
 Please review and sign

## 2023-10-08 NOTE — Telephone Encounter (Signed)
We will of course do this - we need to start patiet in clinic with first dose and be unable to do so until approved

## 2023-10-08 NOTE — Telephone Encounter (Signed)
Patient states she would like a call with an update once we receive a response from patient assistance. Please advise.

## 2023-10-08 NOTE — Telephone Encounter (Signed)
Received fax from [facility] - patient needs to apply for LIS and be denied for [employer] reconsideration. Sending to media tab as patient is in process of switching to [employer]

## 2023-10-08 NOTE — Telephone Encounter (Signed)
Patient advised Ok to send in prednisone 20 mg tapering by 5 mg every 2 day---then continue maintenance dose of prednisone 5 mg daily. Patient verbalized understanding.

## 2023-10-08 NOTE — Addendum Note (Signed)
Addended by: Metta Clines on: 10/08/2023 08:18 AM   Modules accepted: Orders

## 2023-10-09 DIAGNOSIS — R300 Dysuria: Secondary | ICD-10-CM | POA: Diagnosis not present

## 2023-10-09 DIAGNOSIS — R350 Frequency of micturition: Secondary | ICD-10-CM | POA: Diagnosis not present

## 2023-10-09 NOTE — Telephone Encounter (Signed)
 Received fax from [employer] that PAP application has been received. The program will contact insurance to verify coverage.  Patient ID: [id number]  Geraldene Kleine, PharmD, MPH, BCPS, CPP Clinical Pharmacist (Rheumatology and Pulmonology)

## 2023-10-14 NOTE — Telephone Encounter (Signed)
 Received fax from [employer]  PAP. Patient needs to try to apply for LIS through SS based upon income. A denial letter for LIS dated with 6 months of application receive date must be submitted before PAP can proceed with processing    I spoke with patient and provided her with the phone number for [employer] LIS above to follow-up. I advised that if denied, will need denial letter to be submitted to PAP. Additionally if approved, she should notify us  so we can reprocess test claim and check on copay. Recommended she start process ASAP since it can take some time to receive denial letter  She verbalized understanding  Christina Jenkins, PharmD, MPH, BCPS, CPP Clinical Pharmacist (Rheumatology and Pulmonology)

## 2023-10-18 NOTE — Progress Notes (Deleted)
 Name: Christina Jenkins DOB: [birth year] MRN: [id number]  History of Present Illness: [name] is a 63 y.o. female who presents today as a new patient at [facility]. All available relevant medical records have been reviewed.  ***She is accompanied by ***. - GU History: 1. ***.  She reports chief complaint of ***recurrent UTls.  Urine culture results in past 12 months: ***  Urinary Symptoms: She reports *** UTl's in the last year. When present, UTI symptoms include ***dysuria, ***increased urinary urgency, ***frequency, ***. She reports that UTI symptoms do ***not seem to correlate with intercourse. She {Actions; denies-reports:120008} acute UTI symptoms today.  At baseline: She {Actions; denies-reports:120008} urinary urgency, frequency, dysuria, gross hematuria, hesitancy, straining to void, or sensations of incomplete emptying. She reports voiding *** times per day and *** times at night. She {Actions; denies-reports:120008} pushing on a bulge in order to empty her bladder.  She {Actions; denies-reports:120008} urge incontinence. She {Actions; denies-reports:120008} stress incontinence with ***cough/***laugh/***sneeze/***heavy lifting/***exercise. She {Actions; denies-reports:120008} enuresis. She leaks *** times per ***. Wears *** ***pads / ***diapers per day. She states the ***SUI / ***UUI is predominant. This has been going on for {NUMBERS 1-12:18279} {days/wks/mos/yrs:310907} and {ACTION; IS/IS JJO:[id number]} significantly bothersome. In terms of treatment, She has tried ***.  She {Actions; denies-reports:120008} caffeine intake.  She {Actions; denies-reports:120008} history of pyelonephritis.  She {Actions; denies-reports:120008} history of kidney stones.  Vaginal / prolapse Symptoms: She {Actions; denies-reports:120008} vaginal bulge sensation.  She {Actions; denies-reports:120008} seeing a vaginal bulge. This bulge is bothersome and is the size of  ***. It was first noticed ***.  She {Actions; denies-reports:120008} vaginal pain, bleeding, or discharge.  She {Actions; denies-reports:120008} use of topical vaginal estrogen cream.  Bowel Symptoms: She has a continent bowel movement *** time(s) per ***. Typical Bristol stool scale of continent bowel episodes is Type ***. She {Actions; denies-reports:120008} Fl episodes. Fecal incontinence started *** and occurs *** times per week. Typical Bristol stool scale of incontinent bowel episodes is Type ***. She {Actions; denies-reports:120008} straining and {Actions; denies-reports:120008} splinting to defecate. She {Actions; denies-reports:120008} rectal bleeding. She {Actions; denies-reports:120008} feeling like she fully empties her rectum with defecation. She {Actions; denies-reports:120008} urgency with defecation. She {Actions; denies-reports:120008} difficulty wiping clean. She {Actions; denies-reports:120008} taking laxatives, stool softeners, or fiber supplements. Last colonoscopy was***.  Past OB/GYN History: OB History   No obstetric history on file.    She {Actions; denies-reports:120008} being sexually active.  She {Actions; denies-reports:120008} dyspareunia. ***Dyspareunia is located ***at the introitus / ***deep inside the vagina. She has *** child(ren) who were delivered ***vaginally ***via c-section. She {Actions; denies-reports:120008} significant tearing with that ***delivery / those ***deliveries. She is {DESC; PRE/POST:32304} menopausal.  Last pap smear was ***. She {Actions; denies-reports:120008} history of *** hysterectomy.  Fall Screening: Do you usually have a device to assist in your mobility? {yes/no:20286} ***cane / ***walker / ***wheelchair  Medications: Current Outpatient Medications  Medication Sig Dispense Refill   adalimumab (HUMIRA, 2 PEN,) 40 MG/0.4ML pen Inject 0.4 mLs (40 mg total) into the skin every 14 (fourteen) days. 6 each 0    amoxicillin-clavulanate (AUGMENTIN) 875-125 MG tablet Take 1 tablet by mouth every 12 (twelve) hours. (Patient not taking: Reported on 09/18/2023) 20 tablet 0   cetirizine (ZYRTEC) 10 MG tablet Take 10 mg by mouth at bedtime.     DULoxetine (CYMBALTA) 60 MG capsule Take 120 mg by mouth at bedtime.     furosemide (LASIX) 20 MG tablet Take 40 mg by mouth in the morning.  guaiFENesin (MUCINEX) 600 MG 12 hr tablet Take 2 tablets (1,200 mg total) by mouth 2 (two) times daily. (Patient not taking: Reported on 09/18/2023) 20 tablet 0   ibuprofen (ADVIL) 200 MG tablet Take 400 mg by mouth every 8 (eight) hours as needed for moderate pain.     leflunomide (ARAVA) 20 MG tablet TAKE 1 TABLET(20 MG) BY MOUTH DAILY 90 tablet 0   levETIRAcetam (KEPPRA XR) 500 MG 24 hr tablet Take 1 tablet (500 mg total) by mouth daily. (Patient taking differently: Take 500 mg by mouth at bedtime.) 30 tablet 0   lisinopril (ZESTRIL) 10 MG tablet Take 10 mg by mouth at bedtime.     metFORMIN (GLUCOPHAGE-XR) 500 MG 24 hr tablet Take 1,000 mg by mouth daily after supper.     Multiple Vitamin (MULTIVITAMIN) tablet Take 1 tablet by mouth at bedtime.     oxyCODONE (OXY IR/ROXICODONE) 5 MG immediate release tablet Take 1 tablet (5 mg total) by mouth 2 (two) times daily as needed for severe pain (pain score 7-10). (Patient not taking: Reported on 09/18/2023) 14 tablet 0   Potassium Chloride ER 20 MEQ TBCR Take 20 mEq by mouth at bedtime.     predniSONE (DELTASONE) 5 MG tablet Take 4 tabs po qd x 4 days, 3  tabs po qd x 4 days, 2  tabs po qd x 4 days, 1  tab po qd x 4 days (Patient not taking: Reported on 09/18/2023) 40 tablet 0   predniSONE (DELTASONE) 5 MG tablet Take 4 tabs po qd x 7 days, 3  tabs po qd x 7 days, 2  tabs po qd x 7 days (Patient not taking: Reported on 09/18/2023) 63 tablet 0   predniSONE (DELTASONE) 5 MG tablet Take 4 tabs po x 4 days, 3  tabs po x 4 days, 2  tabs po x 4 days, 1  tab po x 4 days (Patient not taking:  Reported on 09/18/2023) 40 tablet 0   predniSONE (DELTASONE) 5 MG tablet Take 1 tablet (5 mg total) by mouth daily with breakfast. 90 tablet 0   predniSONE (DELTASONE) 5 MG tablet Take 4 tabs po x 2 days, 3  tabs po x 2 days, 2 tabs po x 2 days, 1  tab po x 2 days 20 tablet 0   rosuvastatin (CRESTOR) 20 MG tablet Take 20 mg by mouth at bedtime.     No current facility-administered medications for this visit.    Allergies: No Known Allergies  Past Medical History:  Diagnosis Date   Adenocarcinoma of upper lobe of left lung (HCC)    Carpal tunnel syndrome    COPD (chronic obstructive pulmonary disease) (HCC)    Coronary artery calcification seen on CT scan    Normal coronaries at cardiac catheterization 2020   Depression    Essential hypertension    Hepatitis C    Hypokalemia    Mixed hyperlipidemia    Rheumatoid arthritis (HCC)    Rheumatoid arthritis (HCC)    Seasonal allergies    Type 2 diabetes mellitus (HCC)    Varicose veins of legs    Past Surgical History:  Procedure Laterality Date   BACK SURGERY     BRONCHIAL BIOPSY  04/02/2023   Procedure: BRONCHIAL BIOPSIES;  Surgeon: Josephine Igo, DO;  Location: [facility];  Service: Pulmonary;;   BRONCHIAL BRUSHINGS  04/02/2023   Procedure: BRONCHIAL BRUSHINGS;  Surgeon: Josephine Igo, DO;  Location: [facility];  Service: Pulmonary;;  BRONCHIAL NEEDLE ASPIRATION BIOPSY  04/02/2023   Procedure: BRONCHIAL NEEDLE ASPIRATION BIOPSIES;  Surgeon: Josephine Igo, DO;  Location: [facility];  Service: Pulmonary;;   FIDUCIAL MARKER PLACEMENT  04/02/2023   Procedure: FIDUCIAL MARKER PLACEMENT;  Surgeon: Josephine Igo, DO;  Location: [facility];  Service: Pulmonary;;   INTERCOSTAL NERVE BLOCK Left 05/03/2023   Procedure: INTERCOSTAL NERVE BLOCK;  Surgeon: [name], MD;  Location: [facility] OR;  Service: Thoracic;  Laterality: Left;   LEFT HEART CATH AND CORONARY ANGIOGRAPHY N/A 04/30/2019   Procedure: LEFT HEART CATH  AND CORONARY ANGIOGRAPHY;  Surgeon: Runell Gess, MD;  Location: [facility];  Service: Cardiovascular;  Laterality: N/A;   NODE DISSECTION Left 05/03/2023   Procedure: NODE DISSECTION;  Surgeon: [name], MD;  Location: [facility] OR;  Service: Thoracic;  Laterality: Left;   TONSILLECTOMY     TUBAL LIGATION     Family History  Problem Relation Age of Onset   Diabetes Mother    Pancreatic cancer Mother    Diabetes Father    COPD Father    Dementia Father    Diabetes Brother    Social History   Socioeconomic History   Marital status: Married    Spouse name: Not on file   Number of children: 1   Years of education: Not on file   Highest education level: Not on file  Occupational History   Not on file  Tobacco Use   Smoking status: Former    Current packs/day: 1.00    Average packs/day: 2.0 packs/day for 43.0 years (85.5 ttl pk-yrs)    Types: Cigarettes    Start date: 10/18/1980    Passive exposure: Past   Smokeless tobacco: Never  Vaping Use   Vaping status: Never Used  Substance and Sexual Activity   Alcohol use: No   Drug use: No   Sexual activity: Not on file  Other Topics Concern   Not on file  Social History Narrative   1 biological child, 1 stepchild   Social Drivers of Corporate investment banker Strain: Not on file  Food Insecurity: No Food Insecurity (05/04/2023)   Hunger Vital Sign    Worried About Running Out of Food in the Last Year: Never true    Ran Out of Food in the Last Year: Never true  Transportation Needs: No Transportation Needs (05/04/2023)   PRAPARE - Administrator, Civil Service (Medical): No    Lack of Transportation (Non-Medical): No  Physical Activity: Not on file  Stress: Not on file  Social Connections: Not on file  Intimate Partner Violence: Not At Risk (05/04/2023)   Humiliation, Afraid, Rape, and Kick questionnaire    Fear of Current or Ex-Partner: No    Emotionally Abused: No    Physically Abused: No     Sexually Abused: No    SUBJECTIVE  Review of Systems Constitutional: Patient denies any unintentional weight loss or change in strength lntegumentary: Patient denies any rashes or pruritus Cardiovascular: Patient denies chest pain or syncope Respiratory: Patient denies shortness of breath Gastrointestinal: Patient ***denies nausea, vomiting, constipation, or diarrhea Musculoskeletal: Patient denies muscle cramps or weakness Neurologic: Patient denies convulsions or seizures Allergic/Immunologic: Patient denies recent allergic reaction(s) Hematologic/Lymphatic: Patient denies bleeding tendencies Endocrine: Patient denies heat/cold intolerance  GU: As per HPI.  OBJECTIVE There were no vitals filed for this visit. There is no height or weight on file to calculate BMI.  Physical Examination Constitutional: No obvious  distress; patient is non-toxic appearing  Cardiovascular: No visible lower extremity edema.  Respiratory: The patient does not have audible wheezing/stridor; respirations do not appear labored  Gastrointestinal: Abdomen non-distended Musculoskeletal: Normal ROM of UEs  Skin: No obvious rashes/open sores  Neurologic: CN 2-12 grossly intact Psychiatric: Answered questions appropriately with normal affect  Hematologic/Lymphatic/Immunologic: No obvious bruises or sites of spontaneous bleeding  UA:  ***positive for *** leukocytes, *** blood, ***nitrites ***Urine microscopy:  ***negative  *** WBC/hpf, *** RBC/hpf, *** bacteria ***with no evidence of UTI ***with no evidence of microscopic hematuria ***otherwise unremarkable ***glucosuria (secondary to ***Jardiance ***Farxiga use)  PVR: *** ml  ASSESSMENT No diagnosis found.  ***Recurrent UTls / ***History of UTls with insufficient urine culture data to formally validate recurrent UTI diagnosis:  ***  We agreed to plan for follow up in *** months or sooner if needed.  Patient verbalized understanding of and  agreement with current plan. All questions were answered.  PLAN Advised the following: ***. Start topical vaginal estrogen cream as prescribed. ***. Maintain adequate fluid intake daily to flush out the urinary tract. ***. Urinate every 4-6 hours while awake to minimize urinary stasis / bacterial overgrowth in the bladder. ***. Consider OTC supplements for UTI prevention. ***. ***No follow-ups on file.  No orders of the defined types were placed in this encounter.   It has been explained that the patient is to follow regularly with their PCP in addition to all other providers involved in their care and to follow instructions provided by these respective offices. Patient advised to contact urology clinic if any urologic-pertaining questions, concerns, new symptoms or problems arise in the interim period.  There are no Patient Instructions on file for this visit.  Electronically signed by:  Donnita Falls, MSN, FNP-C, CUNP 10/18/2023 3:19 PM

## 2023-10-21 ENCOUNTER — Other Ambulatory Visit: Payer: Self-pay | Admitting: Physician Assistant

## 2023-10-21 NOTE — Telephone Encounter (Signed)
Patient contacted the office and requested her pharmacy to send over a prescription request for another prednisone taper. Patient states she was supposed to see her Urologist Wednesday and now her appointment has been cancelled due to weather. Patient states she won't see her Urologist until 11/04/2023 and needs something for the pain. Patient states due to UTIs she has not been able to be on her Humira or Arava. Patient states she is waiting to take them again until she sees her Urologist. Please advise.

## 2023-10-22 NOTE — Telephone Encounter (Signed)
Contacted the patient and advised the patient the prednisone taper was sent into her pharmacy. Patient verbalized understanding.

## 2023-10-23 ENCOUNTER — Ambulatory Visit: Payer: Medicare Other | Admitting: Urology

## 2023-10-25 ENCOUNTER — Other Ambulatory Visit: Payer: Self-pay | Admitting: *Deleted

## 2023-10-25 MED ORDER — LEFLUNOMIDE 20 MG PO TABS: ORAL_TABLET | ORAL | 0 refills | Status: DC

## 2023-10-25 NOTE — Telephone Encounter (Signed)
Refill request received via fax from [facility], [place]  for [facility]  Last Fill: 06/14/2023  Labs: 09/16/2023 Glucose 102    Albumin 3.7   Next Visit: Due April 2025. Message sent to the front to schedule.   Last Visit: 09/18/2023  DX: Rheumatoid arthritis with rheumatoid factor of multiple sites without organ or systems involvement   Current Dose per office note 09/18/2023: Arava 20 mg 1 tablet by mouth daily   Okay to refill Arava ?

## 2023-10-25 NOTE — Telephone Encounter (Signed)
Please schedule patient a follow up visit. Patient due April 2025. Thanks!   Follow-Up Instructions: Return in 3 months (on 12/17/2023) for Rheumatoid arthritis.

## 2023-10-29 ENCOUNTER — Other Ambulatory Visit: Payer: Self-pay | Admitting: *Deleted

## 2023-10-29 DIAGNOSIS — N390 Urinary tract infection, site not specified: Secondary | ICD-10-CM | POA: Insufficient documentation

## 2023-10-29 DIAGNOSIS — Z9189 Other specified personal risk factors, not elsewhere classified: Secondary | ICD-10-CM | POA: Insufficient documentation

## 2023-10-29 MED ORDER — PREDNISONE 5 MG PO TABS: ORAL_TABLET | ORAL | 0 refills | Status: DC

## 2023-10-29 NOTE — Progress Notes (Signed)
 Name: Christina Jenkins DOB: 1961/01/01 MRN: [id number]  History of Present Illness: Christina Jenkins is a 63 y.o. female who presents today as a new patient at [facility] [place]. All available relevant medical records have been reviewed.   She reports chief complaint of recurrent UTls.  Urine culture results in past 12 months: - 08/15/2023: Positive for Klebsiella pneumoniae. Treated by PCP with Keflex. Symptoms included urinary frequency. - 08/31/2023: Positive for Klebsiella pneumoniae. Treated by PCP with Doxcycyline. Symptoms included dysuria. - 09/11/2023: Positive for Klebsiella pneumoniae. Treated by PCP with Keflex.  - 09/27/2023: Positive for Klebsiella pneumoniae. Treated by PCP. - 10/09/2023: Positive for Klebsiella pneumoniae. Treated by PCP with Bactrim DS.  Urinary Symptoms: She reports 5 UTl's in the last year. When present, UTI symptoms include dysuria, increased urinary urgency, frequency. She denies acute UTI symptoms today.  She stopped taking her Leflunomide (Arava) about 3-4 weeks ago as directed by her rheumatologist due to its immunosuppressive effect given that she has had these relapsing UTIs and states she waiting a negative urine culture result to be able to restart that, which she is eager to do because her rheumatoid arthritis has been worse without it.   At baseline: She reports urinary frequency due to Lasix use. Denies urgency, incontinence, dysuria, gross hematuria, hesitancy, straining to void, or sensations of incomplete emptying.  She denies pushing on a bulge in order to empty her bladder.  She denies history of pyelonephritis.  She denies history of kidney stones.  Vaginal / prolapse Symptoms: She denies vaginal bulge sensation.  She denies seeing a vaginal bulge.  She denies vaginal pain, bleeding, or discharge.   Bowel Symptoms: She denies constipation, diarrhea, rectal bleeding, pain with defecation, straining to defecate, or fecal  incontinence.  Past OB/GYN History: OB History   No obstetric history on file.    She denies being sexually active.  She is post menopausal.  She denies history of hysterectomy.  Fall Screening: Do you usually have a device to assist in your mobility? No   Medications: Current Outpatient Medications  Medication Sig Dispense Refill   DULoxetine (CYMBALTA) 60 MG capsule Take 120 mg by mouth at bedtime.     furosemide (LASIX) 20 MG tablet Take 40 mg by mouth in the morning.     guaiFENesin (MUCINEX) 600 MG 12 hr tablet Take 2 tablets (1,200 mg total) by mouth 2 (two) times daily. 20 tablet 0   ibuprofen (ADVIL) 200 MG tablet Take 400 mg by mouth every 8 (eight) hours as needed for moderate pain.     leflunomide (ARAVA) 20 MG tablet TAKE 1 TABLET(20 MG) BY MOUTH DAILY 90 tablet 0   levETIRAcetam (KEPPRA XR) 500 MG 24 hr tablet Take 1 tablet (500 mg total) by mouth daily. (Patient taking differently: Take 500 mg by mouth at bedtime.) 30 tablet 0   lisinopril (ZESTRIL) 10 MG tablet Take 10 mg by mouth at bedtime.     metFORMIN (GLUCOPHAGE-XR) 500 MG 24 hr tablet Take 1,000 mg by mouth daily after supper.     Multiple Vitamin (MULTIVITAMIN) tablet Take 1 tablet by mouth at bedtime.     Potassium Chloride ER 20 MEQ TBCR Take 20 mEq by mouth at bedtime.     predniSONE (DELTASONE) 5 MG tablet Take 1 tablet (5 mg total) by mouth daily with breakfast. 90 tablet 0   rosuvastatin (CRESTOR) 20 MG tablet Take 20 mg by mouth at bedtime.     cetirizine (ZYRTEC) 10  MG tablet Take 10 mg by mouth at bedtime. (Patient not taking: Reported on 11/04/2023)     No current facility-administered medications for this visit.    Allergies: No Known Allergies  Past Medical History:  Diagnosis Date   Adenocarcinoma of upper lobe of left lung (HCC)    Carpal tunnel syndrome    COPD (chronic obstructive pulmonary disease) (HCC)    Coronary artery calcification seen on CT scan    Normal coronaries at cardiac  catheterization 2020   Depression    Essential hypertension    Hepatitis C    Hypokalemia    Mixed hyperlipidemia    Rheumatoid arthritis (HCC)    Rheumatoid arthritis (HCC)    Seasonal allergies    Type 2 diabetes mellitus (HCC)    Varicose veins of legs    Past Surgical History:  Procedure Laterality Date   BACK SURGERY     BRONCHIAL BIOPSY  04/02/2023   Procedure: BRONCHIAL BIOPSIES;  Surgeon: Josephine Igo, DO;  Location: [facility];  Service: Pulmonary;;   BRONCHIAL BRUSHINGS  04/02/2023   Procedure: BRONCHIAL BRUSHINGS;  Surgeon: Josephine Igo, DO;  Location: [facility];  Service: Pulmonary;;   BRONCHIAL NEEDLE ASPIRATION BIOPSY  04/02/2023   Procedure: BRONCHIAL NEEDLE ASPIRATION BIOPSIES;  Surgeon: Josephine Igo, DO;  Location: [facility];  Service: Pulmonary;;   FIDUCIAL MARKER PLACEMENT  04/02/2023   Procedure: FIDUCIAL MARKER PLACEMENT;  Surgeon: Josephine Igo, DO;  Location: [facility];  Service: Pulmonary;;   INTERCOSTAL NERVE BLOCK Left 05/03/2023   Procedure: INTERCOSTAL NERVE BLOCK;  Surgeon: [name], MD;  Location: [facility] OR;  Service: Thoracic;  Laterality: Left;   LEFT HEART CATH AND CORONARY ANGIOGRAPHY N/A 04/30/2019   Procedure: LEFT HEART CATH AND CORONARY ANGIOGRAPHY;  Surgeon: Runell Gess, MD;  Location: [facility];  Service: Cardiovascular;  Laterality: N/A;   NODE DISSECTION Left 05/03/2023   Procedure: NODE DISSECTION;  Surgeon: [name], MD;  Location: [facility] OR;  Service: Thoracic;  Laterality: Left;   TONSILLECTOMY     TUBAL LIGATION     Family History  Problem Relation Age of Onset   Diabetes Mother    Pancreatic cancer Mother    Diabetes Father    COPD Father    Dementia Father    Diabetes Brother    Social History   Socioeconomic History   Marital status: Married    Spouse name: Not on file   Number of children: 1   Years of education: Not on file   Highest education level: Not on file   Occupational History   Not on file  Tobacco Use   Smoking status: Former    Current packs/day: 1.00    Average packs/day: 2.0 packs/day for 43.0 years (85.5 ttl pk-yrs)    Types: Cigarettes    Start date: 10/18/1980    Passive exposure: Past   Smokeless tobacco: Never  Vaping Use   Vaping status: Never Used  Substance and Sexual Activity   Alcohol use: No   Drug use: No   Sexual activity: Not on file  Other Topics Concern   Not on file  Social History Narrative   1 biological child, 1 stepchild   Social Drivers of Corporate investment banker Strain: Not on file  Food Insecurity: No Food Insecurity (05/04/2023)   Hunger Vital Sign    Worried About Running Out of Food in the Last Year: Never true    Ran Out  of Food in the Last Year: Never true  Transportation Needs: No Transportation Needs (05/04/2023)   PRAPARE - Administrator, Civil Service (Medical): No    Lack of Transportation (Non-Medical): No  Physical Activity: Not on file  Stress: Not on file  Social Connections: Not on file  Intimate Partner Violence: Not At Risk (05/04/2023)   Humiliation, Afraid, Rape, and Kick questionnaire    Fear of Current or Ex-Partner: No    Emotionally Abused: No    Physically Abused: No    Sexually Abused: No    SUBJECTIVE  Review of Systems Constitutional: Patient denies any unintentional weight loss or change in strength lntegumentary: Patient denies any rashes or pruritus Cardiovascular: Patient denies chest pain or syncope Respiratory: Patient denies shortness of breath Gastrointestinal: Patient denies nausea, vomiting, constipation, or diarrhea Musculoskeletal: Patient denies muscle cramps or weakness Neurologic: Patient denies convulsions or seizures Allergic/Immunologic: Patient denies recent allergic reaction(s) Hematologic/Lymphatic: Patient denies bleeding tendencies Endocrine: Patient denies heat/cold intolerance  GU: As per HPI.  OBJECTIVE There were  no vitals filed for this visit. There is no height or weight on file to calculate BMI.  Physical Examination Constitutional: No obvious distress; patient is non-toxic appearing  Cardiovascular: No visible lower extremity edema.  Respiratory: The patient does not have audible wheezing/stridor; respirations do not appear labored  Gastrointestinal: Abdomen non-distended Musculoskeletal: Normal ROM of UEs  Skin: No obvious rashes/open sores  Neurologic: CN 2-12 grossly intact Psychiatric: Answered questions appropriately with normal affect  Hematologic/Lymphatic/Immunologic: No obvious bruises or sites of spontaneous bleeding  UA: negative  PVR: 261 ml  ASSESSMENT Recurrent UTI - Plan: Urinalysis, Routine w reflex microscopic, BLADDER SCAN AMB NON-IMAGING, Urine Culture  At risk for infection due to immunosuppression - Plan: Urinalysis, Routine w reflex microscopic, BLADDER SCAN AMB NON-IMAGING, Urine Culture  Relapsing UTl: Urine culture sent to confirm resolution of UTI so she can restart Leflunomide Ranae Plumber) for her rheumatoid arthritis.  Handout provided about at-home options for UTI prevention. If relapsing / recurrent UTls persist, may consider UTI prophylaxis with a daily low dose antibiotic and/or starting topical vaginal estrogen cream in the future.  We agreed to plan for follow up in 3 months or sooner if needed. Patient verbalized understanding of and agreement with current plan. All questions were answered.  PLAN Advised the following: 1. Urine culture. 2. Return in about 3 months (around 02/04/2024) for UA, PVR, & f/u with Evette Georges NP.  Orders Placed This Encounter  Procedures   Urine Culture   Urinalysis, Routine w reflex microscopic   BLADDER SCAN AMB NON-IMAGING    It has been explained that the patient is to follow regularly with their PCP in addition to all other providers involved in their care and to follow instructions provided by these respective offices.  Patient advised to contact urology clinic if any urologic-pertaining questions, concerns, new symptoms or problems arise in the interim period.  Patient Instructions  UTI prevention / management:  Difference between Urinalysis (urine dipstick test) and Urine culture:  Urinalysis (urine dipstick test): A quick office test used as an indicator to determine whether or not further testing is necessary (such as a urine culture, urine microscopy, etc.) The urinalysis cannot differentiate a true bacterial UTI or give a definitive diagnosis for the findings.  Urine culture: May be performed based on the findings of a urinalysis to evaluate for UTI. Grows out on a petri dish for 48-72 hours. Provides important information about: whether or not  bacterial growth is present and if so: what the predominant bacteria is which antibiotics will work best against that bacteria That information is important so that we can diagnose and treat patients appropriately as there are other conditions which may mimic UTls which must not be missed (such as cancer, interstitial cystitis, stones, etc.). Assists [place] with antibiotic stewardship to minimize patient's risk for developing antibiotic resistance (getting to a point where no antibiotics work anymore).  Options when UTI symptoms occur: 1. Call [facility] [place] and request to speak with triage nurse (phone # [phone], select option 3). In accordance with clinic guidelines the nurse will determine next steps based on patient-reported symptoms, which may include: same-day lab visit to provide urine specimen, recommendation to schedule Urology office visit appointment for further evaluation, recommendation to proceed to ER, etc. 2. Call your Primary Care Provider (PCP) office to request urgent / same-day visit. Be sure to request for urine culture to be ordered and have results faxed to Urology (fax # [phone]).  3. Go to urgent care. Be sure to  request for urine culture to be ordered and have results faxed to Urology (fax # [phone]).   For bladder pain/ burning with urination: - Can take over-the-counter Pyridium (phenazopyridine; commonly known under the "AZO" brand) for a few days as needed. Limit use to no more than 3 days consecutively due to risk for methemoglobinemia, liver function issues, and bone health damage with long term use of Pyridium. - Alternative: Prescription urinary analgesics (such as Uribel, Urogesic blue, Urelle, Uro-MP). Often expensive / poorly covered by insurance unfortunately.  Options / recommendations for UTI prevention: - Adequate daily fluid intake to flush out the urinary tract. - Go to the bathroom to urinate every 4-6 hours while awake to minimize urinary stasis / bacterial overgrowth in the bladder. - Proanthocyanidin (PAC) supplement 36 mg daily; must be soluble (insoluble form of PAC will be ineffective). Recommended brand: Ellura. This is an over-the-counter supplement (often must be found/ purchased online) supplement derived from cranberries with concentrated active component: Proanthocyanidin (PAC) 36 mg daily. Decreases bacterial adherence to bladder lining.  - D-mannose powder (2 grams daily). This is an over-the-counter supplement which decreases bacterial adherence to bladder lining (it is a sugar that inhibits bacterial adherence to urothelial cells by binding to the pili of enteric bacteria). Take as per manufacturer recommendation. Can be used as an alternative or in addition to the concentrated cranberry supplement.  - Vitamin C supplement to acidify urine to minimize bacterial growth.  - Probiotic to maintain healthy vaginal microbiome to suppress bacteria at urethral opening. Brand recommendations: Darrold Junker (includes probiotic & D-mannose ), Feminine Balance (highest concentration of lactobacillus) or Hyperbiotic Pro 15.   Electronically signed by:  Donnita Falls, MSN, FNP-C,  [facility]UNP 11/04/2023 10:39 AM

## 2023-10-29 NOTE — Telephone Encounter (Signed)
 Patient contacted the office stating she has completed the prednisone taper. Patient states her appointment with the urologist was pushed back due to the snow. Patient is requesting another prednisone taper until she has seen the urologist and cleared to restart her medication. Please advise.

## 2023-10-29 NOTE — Telephone Encounter (Signed)
 Has she had a recurrence of swelling? We do not recommend frequent prednisone tapers if she is not having active inflammation. Has she resumed prednisone 5 mg daily?  She should call to see if she can be on a cancellation list in order to receive clearance ASAP.

## 2023-10-29 NOTE — Telephone Encounter (Signed)
 Reached out to patient and she states she is having swelling her bilateral hands. Patient states she has resumed Prednisone 5 mg daily. Patient states she has an appointment with the Urologist on Monday 11/04/2023.

## 2023-10-29 NOTE — Telephone Encounter (Signed)
 Ok to provide taper for 20 mg tapering by 5 mg every 2 days.

## 2023-10-29 NOTE — Addendum Note (Signed)
 Addended by: Henriette Combs on: 10/29/2023 04:20 PM   Modules accepted: Orders

## 2023-10-30 ENCOUNTER — Inpatient Hospital Stay: Payer: Medicare Other | Attending: Hematology

## 2023-10-30 ENCOUNTER — Ambulatory Visit (HOSPITAL_COMMUNITY)
Admission: RE | Admit: 2023-10-30 | Discharge: 2023-10-30 | Disposition: A | Discharge status: Home / Self Care | Payer: Medicare Other | Source: Ambulatory Visit | Attending: Hematology | Admitting: Hematology

## 2023-10-30 DIAGNOSIS — C3412 Malignant neoplasm of upper lobe, left bronchus or lung: Secondary | ICD-10-CM | POA: Insufficient documentation

## 2023-10-30 DIAGNOSIS — J439 Emphysema, unspecified: Secondary | ICD-10-CM | POA: Diagnosis not present

## 2023-10-30 DIAGNOSIS — I2510 Atherosclerotic heart disease of native coronary artery without angina pectoris: Secondary | ICD-10-CM | POA: Diagnosis not present

## 2023-10-30 DIAGNOSIS — I700 Atherosclerosis of aorta: Secondary | ICD-10-CM | POA: Insufficient documentation

## 2023-10-30 DIAGNOSIS — J432 Centrilobular emphysema: Secondary | ICD-10-CM | POA: Diagnosis not present

## 2023-10-30 LAB — COMPREHENSIVE METABOLIC PANEL
ALT: 19 U/L (ref 0–44)
AST: 18 U/L (ref 15–41)
Albumin: 3.9 g/dL (ref 3.5–5.0)
Alkaline Phosphatase: 67 U/L (ref 38–126)
Anion gap: 15 (ref 5–15)
BUN: 13 mg/dL (ref 8–23)
CO2: 23 mmol/L (ref 22–32)
Calcium: 9.6 mg/dL (ref 8.9–10.3)
Chloride: 102 mmol/L (ref 98–111)
Creatinine, Ser: 1.02 mg/dL — ABNORMAL HIGH (ref 0.44–1.00)
GFR, Estimated: >60 mL/min (ref >60)
Glucose, Bld: 174 mg/dL — ABNORMAL HIGH (ref 70–99)
Potassium: 3.7 mmol/L (ref 3.5–5.1)
Sodium: 140 mmol/L (ref 135–145)
Total Bilirubin: 0.5 mg/dL (ref 0.0–1.2)
Total Protein: 7.1 g/dL (ref 6.5–8.1)

## 2023-10-30 LAB — CBC WITH DIFFERENTIAL/PLATELET
Abs Immature Granulocytes: 0.05 10*3/uL (ref 0.00–0.07)
Basophils Absolute: 0 10*3/uL (ref 0.0–0.1)
Basophils Relative: 0 %
Eosinophils Absolute: 0 10*3/uL (ref 0.0–0.5)
Eosinophils Relative: 0 %
HCT: 38.3 % (ref 36.0–46.0)
Hemoglobin: 12.5 g/dL (ref 12.0–15.0)
Immature Granulocytes: 0 %
Lymphocytes Relative: 6 %
Lymphs Abs: 0.7 10*3/uL (ref 0.7–4.0)
MCH: 30.4 pg (ref 26.0–34.0)
MCHC: 32.6 g/dL (ref 30.0–36.0)
MCV: 93.2 fL (ref 80.0–100.0)
Monocytes Absolute: 0.1 10*3/uL (ref 0.1–1.0)
Monocytes Relative: 1 %
Neutro Abs: 10.3 10*3/uL — ABNORMAL HIGH (ref 1.7–7.7)
Neutrophils Relative %: 93 %
Platelets: 249 10*3/uL (ref 150–400)
RBC: 4.11 MIL/uL (ref 3.87–5.11)
RDW: 12.7 % (ref 11.5–15.5)
WBC: 11.2 10*3/uL — ABNORMAL HIGH (ref 4.0–10.5)
nRBC: 0 % (ref 0.0–0.2)

## 2023-10-30 MED ORDER — IOHEXOL 300 MG/ML  SOLN
80.0000 mL | Freq: Once | INTRAMUSCULAR | Status: AC | PRN
  Administered 2023-10-30: 75 mL via INTRAVENOUS

## 2023-11-04 ENCOUNTER — Ambulatory Visit: Payer: Medicare Other | Admitting: Urology

## 2023-11-04 ENCOUNTER — Encounter: Payer: Self-pay | Admitting: Urology

## 2023-11-04 DIAGNOSIS — Z9189 Other specified personal risk factors, not elsewhere classified: Secondary | ICD-10-CM | POA: Diagnosis not present

## 2023-11-04 DIAGNOSIS — Z09 Encounter for follow-up examination after completed treatment for conditions other than malignant neoplasm: Secondary | ICD-10-CM | POA: Diagnosis not present

## 2023-11-04 DIAGNOSIS — Z87440 Personal history of urinary (tract) infections: Secondary | ICD-10-CM | POA: Diagnosis not present

## 2023-11-04 DIAGNOSIS — N390 Urinary tract infection, site not specified: Secondary | ICD-10-CM

## 2023-11-04 LAB — URINALYSIS, ROUTINE W REFLEX MICROSCOPIC
Bilirubin, UA: NEGATIVE
Glucose, UA: NEGATIVE
Ketones, UA: NEGATIVE
Leukocytes,UA: NEGATIVE
Nitrite, UA: NEGATIVE
Protein,UA: NEGATIVE
RBC, UA: NEGATIVE
Specific Gravity, UA: <1.005 — ABNORMAL LOW (ref 1.005–1.030)
Urobilinogen, Ur: 0.2 mg/dL (ref 0.2–1.0)
pH, UA: 5.5 (ref 5.0–7.5)

## 2023-11-04 LAB — BLADDER SCAN AMB NON-IMAGING: Scan Result: 261

## 2023-11-04 NOTE — Progress Notes (Signed)
 Bladder Scan completed today.  Patient can void prior to the bladder scan. Bladder scan result: 261  Performed By: Kennyth Lose, CMA  Additional notes-

## 2023-11-04 NOTE — Patient Instructions (Addendum)
 UTI prevention / management:  Difference between Urinalysis (urine dipstick test) and Urine culture:  Urinalysis (urine dipstick test): A quick office test used as an indicator to determine whether or not further testing is necessary (such as a urine culture, urine microscopy, etc.) The urinalysis cannot differentiate a true bacterial UTI or give a definitive diagnosis for the findings.  Urine culture: May be performed based on the findings of a urinalysis to evaluate for UTI. Grows out on a petri dish for 48-72 hours. Provides important information about: whether or not bacterial growth is present and if so: what the predominant bacteria is which antibiotics will work best against that bacteria That information is important so that we can diagnose and treat patients appropriately as there are other conditions which may mimic UTls which must not be missed (such as cancer, interstitial cystitis, stones, etc.). Assists [place] with antibiotic stewardship to minimize patient's risk for developing antibiotic resistance (getting to a point where no antibiotics work anymore).  Options when UTI symptoms occur: 1. Call [facility] [place] and request to speak with triage nurse (phone # [phone], select option 3). In accordance with clinic guidelines the nurse will determine next steps based on patient-reported symptoms, which may include: same-day lab visit to provide urine specimen, recommendation to schedule Urology office visit appointment for further evaluation, recommendation to proceed to ER, etc. 2. Call your Primary Care Provider (PCP) office to request urgent / same-day visit. Be sure to request for urine culture to be ordered and have results faxed to Urology (fax # [phone]).  3. Go to urgent care. Be sure to request for urine culture to be ordered and have results faxed to Urology (fax # [phone]).   For bladder pain/ burning with urination: - Can take over-the-counter  Pyridium (phenazopyridine; commonly known under the "AZO" brand) for a few days as needed. Limit use to no more than 3 days consecutively due to risk for methemoglobinemia, liver function issues, and bone health damage with long term use of Pyridium. - Alternative: Prescription urinary analgesics (such as Uribel, Urogesic blue, Urelle, Uro-MP). Often expensive / poorly covered by insurance unfortunately.  Options / recommendations for UTI prevention: - Adequate daily fluid intake to flush out the urinary tract. - Go to the bathroom to urinate every 4-6 hours while awake to minimize urinary stasis / bacterial overgrowth in the bladder. - Proanthocyanidin (PAC) supplement 36 mg daily; must be soluble (insoluble form of PAC will be ineffective). Recommended brand: Ellura. This is an over-the-counter supplement (often must be found/ purchased online) supplement derived from cranberries with concentrated active component: Proanthocyanidin (PAC) 36 mg daily. Decreases bacterial adherence to bladder lining.  - D-mannose powder (2 grams daily). This is an over-the-counter supplement which decreases bacterial adherence to bladder lining (it is a sugar that inhibits bacterial adherence to urothelial cells by binding to the pili of enteric bacteria). Take as per manufacturer recommendation. Can be used as an alternative or in addition to the concentrated cranberry supplement.  - Vitamin C supplement to acidify urine to minimize bacterial growth.  - Probiotic to maintain healthy vaginal microbiome to suppress bacteria at urethral opening. Brand recommendations: Darrold Junker (includes probiotic & D-mannose ), Feminine Balance (highest concentration of lactobacillus) or Hyperbiotic Pro 15.

## 2023-11-05 ENCOUNTER — Telehealth: Payer: Self-pay

## 2023-11-05 ENCOUNTER — Ambulatory Visit: Payer: Medicare Other | Admitting: Thoracic Surgery (Cardiothoracic Vascular Surgery)

## 2023-11-05 DIAGNOSIS — E119 Type 2 diabetes mellitus without complications: Secondary | ICD-10-CM | POA: Diagnosis not present

## 2023-11-05 DIAGNOSIS — E785 Hyperlipidemia, unspecified: Secondary | ICD-10-CM | POA: Diagnosis not present

## 2023-11-05 DIAGNOSIS — I10 Essential (primary) hypertension: Secondary | ICD-10-CM | POA: Diagnosis not present

## 2023-11-05 NOTE — Telephone Encounter (Signed)
 Patient contacted the office requesting a refill of her Oxycodone. She is scheduled to see Dr. Dorris Fetch in the office next Tuesday. Advised per Dr. Dorris Fetch, she will need to be seen in the office before another refill is given. She acknowledged receipt. She did get her chest CT done for her appt.

## 2023-11-07 ENCOUNTER — Inpatient Hospital Stay: Payer: Medicare Other | Attending: Hematology | Admitting: Hematology

## 2023-11-07 VITALS — BP 118/54 | HR 100 | Temp 99.3°F | Resp 19 | Ht 66.0 in | Wt 173.8 lb

## 2023-11-07 DIAGNOSIS — Z7969 Long term (current) use of other immunomodulators and immunosuppressants: Secondary | ICD-10-CM | POA: Insufficient documentation

## 2023-11-07 DIAGNOSIS — Z79899 Other long term (current) drug therapy: Secondary | ICD-10-CM | POA: Insufficient documentation

## 2023-11-07 DIAGNOSIS — Z87891 Personal history of nicotine dependence: Secondary | ICD-10-CM | POA: Diagnosis not present

## 2023-11-07 DIAGNOSIS — Z7984 Long term (current) use of oral hypoglycemic drugs: Secondary | ICD-10-CM | POA: Diagnosis not present

## 2023-11-07 DIAGNOSIS — Z7952 Long term (current) use of systemic steroids: Secondary | ICD-10-CM | POA: Insufficient documentation

## 2023-11-07 DIAGNOSIS — M069 Rheumatoid arthritis, unspecified: Secondary | ICD-10-CM | POA: Diagnosis not present

## 2023-11-07 DIAGNOSIS — C3412 Malignant neoplasm of upper lobe, left bronchus or lung: Secondary | ICD-10-CM | POA: Diagnosis not present

## 2023-11-07 NOTE — Progress Notes (Signed)
 [facility] [address] [place] [address]   Clinic Day:  11/07/2023  Referring physician: Encarnacion Slates, PA-C  Patient Care Team: Joeseph Amor as PCP - General (General Practice) Jonelle Sidle, MD as PCP - Cardiology (Cardiology) Case, Helen Hashimoto, MD as Attending Physician (Orthopedic Surgery) Pollyann Savoy, MD as Consulting Physician (Rheumatology)   ASSESSMENT & PLAN:   Assessment:  1.  Stage I (T1b N0 M0) adenocarcinoma of the left upper lobe of the lung: - Abnormal low-dose lung cancer screening scan at [facility]. - PET scan (04/18/2023): 18 mm left upper lobe nodule, SUV 2.1.  No evidence of metastatic disease. - Robotic left VATS, lingular sparing left upper lobectomy, lymph node dissection on 05/03/2023 by Dr. Dorris Fetch. - Pathology: 1.2 cm invasive adenocarcinoma.  No lymphovascular/perineural invasion.  0/13 lymph nodes involved.  pT1b pN0.  2.  Social/family history: - Lives at home with her husband.  On disability since 2016.  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  Quit smoking cigarettes in August 2024.  Smoked 2 packs/day for 40 years. - Maternal grandmother had ovarian cancer.  Mother had pancreatic cancer.  Plan:  1.  Stage I (T1b N0 M0) adenocarcinoma of the left upper lobe of the lung: - She denies any chest pain or hemoptysis. - Reviewed CT chest from 10/30/2023: No evidence of recurrence.  Other benign findings discussed with the patient. - Labs from 10/30/2023: Normal LFTs.  Mildly elevated white count likely from prednisone 5 mg daily she takes for rheumatoid arthritis. - Recommend follow-up in 6 months with CT chest and labs.   Orders Placed This Encounter  Procedures   CT CHEST W CONTRAST    Standing Status:   Future    Expected Date:   05/09/2024    Expiration Date:   11/06/2024    If indicated for the ordered procedure, I authorize the administration of contrast media per Radiology protocol:   Yes     Does the patient have a contrast media/X-ray dye allergy?:   No    Preferred imaging location?:   [facility]   CBC with Differential    Standing Status:   Future    Expected Date:   05/04/2024    Expiration Date:   11/06/2024   Comprehensive metabolic panel    Standing Status:   Future    Expected Date:   05/04/2024    Expiration Date:   11/06/2024      Mikeal Hawthorne R Teague,acting as a scribe for Doreatha Massed, MD.,have documented all relevant documentation on the behalf of Doreatha Massed, MD,as directed by  Doreatha Massed, MD while in the presence of Doreatha Massed, MD.  I, Doreatha Massed MD, have reviewed the above documentation for accuracy and completeness, and I agree with the above.     Doreatha Massed, MD   3/6/20252:47 PM  CHIEF COMPLAINT/PURPOSE OF CONSULT:   Diagnosis: LUL lung adenocarcinoma   Cancer Staging  Primary adenocarcinoma of upper lobe of left lung (HCC) Staging form: Lung, AJCC 8th Edition - Clinical stage from 06/02/2023: Stage IA2 (cT1b, cN0, cM0) - Unsigned    Prior Therapy: left upper lobectomy, 05/03/23  Current Therapy: Surveillance   HISTORY OF PRESENT ILLNESS:   Oncology History   No history exists.      Christina Jenkins is a 63 y.o. female presenting to clinic today for evaluation of LUL lung adenocarcinoma at the request of Dr. Dorris Fetch.  She is  a current smoker with a 40-pack-year history of smoking. She was started on lung cancer screening CT in 07/2020. Her most recent CT from 02/11/23 at [facility] showed: slow-growing mixed solid and sub-solid lesion in left upper lobe, now categorized as Lung-RADS 4BS. She was referred to Dr. Sherene Sires in pulmonology for further evaluation. She underwent super D chest CT on 03/22/23 followed by bronchoscopy with biopsies on 04/02/23 under Dr. Tonia Brooms. Cytology from the LUL lesion confirmed non-small cell carcinoma, morphologically favoring adenocarcinoma. Additional LUL brushing also showed  atypical cells suspicious for malignancy.  She underwent staging PET scan on 04/18/23 showing: 18 mm LUL nodule, corresponding to patient's known primary bronchogenic carcinoma; no evidence of metastatic disease.  She was referred to Dr. Dorris Fetch and proceeded to left upper lobectomy on 05/03/23. Pathology from the procedure showed: invasive adenocarcinoma, 1.2 cm; all margins negative; all 13 lymph nodes negative (0/13).  Today, she states that she is doing well overall. Her appetite level is at 100%. Her energy level is at 75%.  INTERVAL HISTORY:   Christina Jenkins is a 63 y.o. female presenting to the clinic today for follow-up of LUL lung adenocarcinoma. She was last seen by me on 06/03/23 in consultation.  Since her last visit, she underwent CT chest on 10/30/23 that found: Interval left upper lobe wedge resection with collapse/consolidation in the left upper lobe. No evidence of metastatic disease. Age advanced left anterior descending coronary artery calcification. Aortic atherosclerosis. Emphysema.  Today, she states that she is doing well overall. Her appetite level is at 100%. Her energy level is at 75%. She is accompanied by a family member. Christina Jenkins notes mild SOB on exertion, which she has noticed when walking around grocery stores. She has quit smoking. She denies any cough or hemoptysis. Christina Jenkins is taking Prednisone 5 mg OD for rheumatoid arthritis, mostly affecting the hands.   PAST MEDICAL HISTORY:   Past Medical History: Past Medical History:  Diagnosis Date   Adenocarcinoma of upper lobe of left lung (HCC)    Carpal tunnel syndrome    COPD (chronic obstructive pulmonary disease) (HCC)    Coronary artery calcification seen on CT scan    Normal coronaries at cardiac catheterization 2020   Depression    Essential hypertension    Hepatitis C    Hypokalemia    Mixed hyperlipidemia    Rheumatoid arthritis (HCC)    Rheumatoid arthritis (HCC)    Seasonal allergies    Type 2  diabetes mellitus (HCC)    Varicose veins of legs     Surgical History: Past Surgical History:  Procedure Laterality Date   BACK SURGERY     BRONCHIAL BIOPSY  04/02/2023   Procedure: BRONCHIAL BIOPSIES;  Surgeon: Josephine Igo, DO;  Location: [facility];  Service: Pulmonary;;   BRONCHIAL BRUSHINGS  04/02/2023   Procedure: BRONCHIAL BRUSHINGS;  Surgeon: Josephine Igo, DO;  Location: [facility];  Service: Pulmonary;;   BRONCHIAL NEEDLE ASPIRATION BIOPSY  04/02/2023   Procedure: BRONCHIAL NEEDLE ASPIRATION BIOPSIES;  Surgeon: Josephine Igo, DO;  Location: [facility];  Service: Pulmonary;;   FIDUCIAL MARKER PLACEMENT  04/02/2023   Procedure: FIDUCIAL MARKER PLACEMENT;  Surgeon: Josephine Igo, DO;  Location: [facility];  Service: Pulmonary;;   INTERCOSTAL NERVE BLOCK Left 05/03/2023   Procedure: INTERCOSTAL NERVE BLOCK;  Surgeon: [name], MD;  Location: [facility] OR;  Service: Thoracic;  Laterality: Left;   LEFT HEART CATH AND CORONARY ANGIOGRAPHY N/A 04/30/2019   Procedure: LEFT HEART CATH  AND CORONARY ANGIOGRAPHY;  Surgeon: Runell Gess, MD;  Location: [facility]ASIVE CV LAB;  Service: Cardiovascular;  Laterality: N/A;   NODE DISSECTION Left 05/03/2023   Procedure: NODE DISSECTION;  Surgeon: [name], MD;  Location: [facility] OR;  Service: Thoracic;  Laterality: Left;   TONSILLECTOMY     TUBAL LIGATION      Social History: Social History   Socioeconomic History   Marital status: Married    Spouse name: Not on file   Number of children: 1   Years of education: Not on file   Highest education level: Not on file  Occupational History   Not on file  Tobacco Use   Smoking status: Former    Current packs/day: 1.00    Average packs/day: 2.0 packs/day for 43.1 years (85.5 ttl pk-yrs)    Types: Cigarettes    Start date: 10/18/1980    Passive exposure: Past   Smokeless tobacco: Never  Vaping Use   Vaping status: Never Used  Substance and Sexual Activity    Alcohol use: No   Drug use: No   Sexual activity: Not on file  Other Topics Concern   Not on file  Social History Narrative   1 biological child, 1 stepchild   Social Drivers of Corporate investment banker Strain: Not on file  Food Insecurity: No Food Insecurity (05/04/2023)   Hunger Vital Sign    Worried About Running Out of Food in the Last Year: Never true    Ran Out of Food in the Last Year: Never true  Transportation Needs: No Transportation Needs (05/04/2023)   PRAPARE - Administrator, Civil Service (Medical): No    Lack of Transportation (Non-Medical): No  Physical Activity: Not on file  Stress: Not on file  Social Connections: Not on file  Intimate Partner Violence: Not At Risk (05/04/2023)   Humiliation, Afraid, Rape, and Kick questionnaire    Fear of Current or Ex-Partner: No    Emotionally Abused: No    Physically Abused: No    Sexually Abused: No    Family History: Family History  Problem Relation Age of Onset   Diabetes Mother    Pancreatic cancer Mother    Diabetes Father    COPD Father    Dementia Father    Diabetes Brother     Current Medications:  Current Outpatient Medications:    cetirizine (ZYRTEC) 10 MG tablet, Take 10 mg by mouth at bedtime. (Patient not taking: Reported on 11/04/2023), Disp: , Rfl:    DULoxetine (CYMBALTA) 60 MG capsule, Take 120 mg by mouth at bedtime., Disp: , Rfl:    furosemide (LASIX) 20 MG tablet, Take 40 mg by mouth in the morning., Disp: , Rfl:    guaiFENesin (MUCINEX) 600 MG 12 hr tablet, Take 2 tablets (1,200 mg total) by mouth 2 (two) times daily., Disp: 20 tablet, Rfl: 0   ibuprofen (ADVIL) 200 MG tablet, Take 400 mg by mouth every 8 (eight) hours as needed for moderate pain., Disp: , Rfl:    leflunomide (ARAVA) 20 MG tablet, TAKE 1 TABLET(20 MG) BY MOUTH DAILY, Disp: 90 tablet, Rfl: 0   levETIRAcetam (KEPPRA XR) 500 MG 24 hr tablet, Take 1 tablet (500 mg total) by mouth daily. (Patient taking differently:  Take 500 mg by mouth at bedtime.), Disp: 30 tablet, Rfl: 0   lisinopril (ZESTRIL) 10 MG tablet, Take 10 mg by mouth at bedtime., Disp: , Rfl:    metFORMIN (GLUCOPHAGE-XR) 500 MG  24 hr tablet, Take 1,000 mg by mouth daily after supper., Disp: , Rfl:    Multiple Vitamin (MULTIVITAMIN) tablet, Take 1 tablet by mouth at bedtime., Disp: , Rfl:    Potassium Chloride ER 20 MEQ TBCR, Take 20 mEq by mouth at bedtime., Disp: , Rfl:    predniSONE (DELTASONE) 5 MG tablet, Take 1 tablet (5 mg total) by mouth daily with breakfast., Disp: 90 tablet, Rfl: 0   rosuvastatin (CRESTOR) 20 MG tablet, Take 20 mg by mouth at bedtime., Disp: , Rfl:    Allergies: No Known Allergies  REVIEW OF SYSTEMS:   Review of Systems  Constitutional:  Negative for chills, fatigue and fever.  HENT:   Negative for lump/mass, mouth sores, nosebleeds, sore throat and trouble swallowing.   Eyes:  Negative for eye problems.  Respiratory:  Positive for shortness of breath (with exertion). Negative for cough.   Cardiovascular:  Negative for chest pain, leg swelling and palpitations.  Gastrointestinal:  Negative for abdominal pain, constipation, diarrhea, nausea and vomiting.  Genitourinary:  Negative for bladder incontinence, difficulty urinating, dysuria, frequency, hematuria and nocturia.   Musculoskeletal:  Negative for arthralgias, back pain, flank pain, myalgias and neck pain.  Skin:  Negative for itching and rash.  Neurological:  Negative for dizziness, headaches and numbness.  Hematological:  Does not bruise/bleed easily.  Psychiatric/Behavioral:  Negative for depression, sleep disturbance and suicidal ideas. The patient is not nervous/anxious.   All other systems reviewed and are negative.    VITALS:   Blood pressure (!) 118/54, pulse 100, temperature 99.3 F (37.4 C), temperature source Tympanic, resp. rate 19, height 5\' 6"  (1.676 m), weight 173 lb 12.8 oz (78.8 kg), SpO2 100%.  Wt Readings from Last 3 Encounters:   11/07/23 173 lb 12.8 oz (78.8 kg)  09/18/23 172 lb (78 kg)  07/02/23 176 lb (79.8 kg)    Body mass index is 28.05 kg/m.  Performance status (ECOG): 1 - Symptomatic but completely ambulatory  PHYSICAL EXAM:   Physical Exam Vitals and nursing note reviewed. Exam conducted with a chaperone present.  Constitutional:      Appearance: Normal appearance.  Cardiovascular:     Rate and Rhythm: Normal rate and regular rhythm.     Pulses: Normal pulses.     Heart sounds: Normal heart sounds.  Pulmonary:     Effort: Pulmonary effort is normal.     Breath sounds: Normal breath sounds.  Abdominal:     Palpations: Abdomen is soft. There is no hepatomegaly, splenomegaly or mass.     Tenderness: There is no abdominal tenderness.  Musculoskeletal:     Right lower leg: No edema.     Left lower leg: No edema.  Lymphadenopathy:     Cervical: No cervical adenopathy.     Right cervical: No superficial, deep or posterior cervical adenopathy.    Left cervical: No superficial, deep or posterior cervical adenopathy.     Upper Body:     Right upper body: No supraclavicular or axillary adenopathy.     Left upper body: No supraclavicular or axillary adenopathy.  Neurological:     General: No focal deficit present.     Mental Status: She is alert and oriented to person, place, and time.  Psychiatric:        Mood and Affect: Mood normal.        Behavior: Behavior normal.     LABS:      Latest Ref Rng & Units 10/30/2023    1:08 PM  05/06/2023    1:59 AM 05/05/2023    7:11 AM  CBC  WBC 4.0 - 10.5 K/uL 11.2  12.2  15.2   Hemoglobin 12.0 - 15.0 g/dL 66.0  63.0  16.0   Hematocrit 36.0 - 46.0 % 38.3  35.8  35.1   Platelets 150 - 400 K/uL 249  146  156       Latest Ref Rng & Units 10/30/2023    1:08 PM 05/06/2023    1:59 AM 05/05/2023    7:11 AM  CMP  Glucose 70 - 99 mg/dL 109  323  557   BUN 8 - 23 mg/dL 13  10  9    Creatinine 0.44 - 1.00 mg/dL 3.22  0.25  4.27   Sodium 135 - 145 mmol/L 140  136   134   Potassium 3.5 - 5.1 mmol/L 3.7  3.5  3.4   Chloride 98 - 111 mmol/L 102  103  102   CO2 22 - 32 mmol/L 23  26  25    Calcium 8.9 - 10.3 mg/dL 9.6  8.2  8.3   Total Protein 6.5 - 8.1 g/dL 7.1   5.5   Total Bilirubin 0.0 - 1.2 mg/dL 0.5   0.6   Alkaline Phos 38 - 126 U/L 67   67   AST 15 - 41 U/L 18   19   ALT 0 - 44 U/L 19   14      No results found for: "CEA1", "CEA" / No results found for: "CEA1", "CEA" No results found for: "PSA1" No results found for: "CWC376" No results found for: "CAN125"  No results found for: "TOTALPROTELP", "ALBUMINELP", "A1GS", "A2GS", "BETS", "BETA2SER", "GAMS", "MSPIKE", "SPEI" No results found for: "TIBC", "FERRITIN", "IRONPCTSAT" No results found for: "LDH"   STUDIES:   CT CHEST W CONTRAST Result Date: 11/06/2023 CLINICAL DATA:  Non-small cell lung cancer.  * Tracking Code: BO * EXAM: CT CHEST WITH CONTRAST TECHNIQUE: Multidetector CT imaging of the chest was performed during intravenous contrast administration. RADIATION DOSE REDUCTION: This exam was performed according to the departmental dose-optimization program which includes automated exposure control, adjustment of the mA and/or kV according to patient size and/or use of iterative reconstruction technique. CONTRAST:  75mL OMNIPAQUE IOHEXOL 300 MG/ML  SOLN COMPARISON:  03/22/2023. FINDINGS: Cardiovascular: Atherosclerotic calcification of the aorta with likely age advanced involvement of the left anterior descending coronary artery. Heart is at the upper limits of normal in size. No pericardial effusion. Mediastinum/Nodes: 9 mm low right paratracheal lymph node, unchanged. No pathologically enlarged mediastinal, hilar or axillary lymph nodes. Esophagus is grossly unremarkable. Lungs/Pleura: Centrilobular and paraseptal emphysema. Multiple calcified granulomas. Interval left upper lobe wedge resection with partial collapse/consolidation in the left upper lobe. Lungs are otherwise clear. No pleural  fluid. Airway is unremarkable. Upper Abdomen: Elevated left hemidiaphragm. Visualized portions of the liver, adrenal glands, kidneys, spleen, pancreas, stomach and bowel are grossly unremarkable. No upper abdominal adenopathy. Musculoskeletal: Degenerative changes in the spine. No worrisome lytic or sclerotic lesions. IMPRESSION: 1. Interval left upper lobe wedge resection with collapse/consolidation in the left upper lobe. No evidence of metastatic disease. 2. Age advanced left anterior descending coronary artery calcification. 3.  Aortic atherosclerosis (ICD10-I70.0). 4.  Emphysema (ICD10-J43.9). Electronically Signed   By: Leanna Battles M.D.   On: 11/06/2023 13:41

## 2023-11-07 NOTE — Patient Instructions (Signed)
 [facility] at [facility] Discharge Instructions   You were seen and examined today by Dr. Ellin Saba.  He reviewed the results of your lab work which are normal/stable.   He reviewed the results of your CT scan which did not show any evidence of cancer.   We will see you back in .   Return as scheduled.    Thank you for choosing [facility] at [facility] to provide your oncology and hematology care.  To afford each patient quality time with our provider, please arrive at least 15 minutes before your scheduled appointment time.   If you have a lab appointment with the Cancer Center please come in thru the Main Entrance and check in at the main information desk.  You need to re-schedule your appointment should you arrive 10 or more minutes late.  We strive to give you quality time with our providers, and arriving late affects you and other patients whose appointments are after yours.  Also, if you no show three or more times for appointments you may be dismissed from the clinic at the providers discretion.     Again, thank you for choosing [facility]  Our hope is that these requests will decrease the amount of time that you wait before being seen by our physicians.       _____________________________________________________________  Should you have questions after your visit to [facility], please contact our office at [phone] and follow the prompts.  Our office hours are 8:00 a.m. and 4:30 p.m. Monday - Friday.  Please note that voicemails left after 4:00 p.m. may not be returned until the following business day.  We are closed weekends and major holidays.  You do have access to a nurse 24-7, just call the main number to the clinic [phone] and do not press any options, hold on the line and a nurse will answer the phone.    For prescription refill requests, have your pharmacy contact our office and allow 72  hours.    Due to Covid, you will need to wear a mask upon entering the hospital. If you do not have a mask, a mask will be given to you at the Main Entrance upon arrival. For doctor visits, patients may have 1 support person age 62 or older with them. For treatment visits, patients can not have anyone with them due to social distancing guidelines and our immunocompromised population.

## 2023-11-12 ENCOUNTER — Telehealth: Payer: Self-pay | Admitting: *Deleted

## 2023-11-12 ENCOUNTER — Encounter: Payer: Self-pay | Admitting: Thoracic Surgery (Cardiothoracic Vascular Surgery)

## 2023-11-12 ENCOUNTER — Ambulatory Visit (INDEPENDENT_AMBULATORY_CARE_PROVIDER_SITE_OTHER): Payer: Medicare Other | Admitting: Thoracic Surgery (Cardiothoracic Vascular Surgery)

## 2023-11-12 ENCOUNTER — Other Ambulatory Visit: Payer: Self-pay | Admitting: Physician Assistant

## 2023-11-12 VITALS — BP 116/72 | HR 95 | Resp 20 | Wt 172.4 lb

## 2023-11-12 DIAGNOSIS — Z902 Acquired absence of lung [part of]: Secondary | ICD-10-CM

## 2023-11-12 MED ORDER — OXYCODONE HCL 5 MG PO TABS: 5.0000 mg | ORAL_TABLET | Freq: Two times a day (BID) | ORAL | 0 refills | Status: DC | PRN

## 2023-11-12 NOTE — Telephone Encounter (Signed)
 Patient contacted the office stating she has seen in the urologist. Patient states they did a culture on her urine and she no longer has a UTI. Patient states she is no longer on antibiotics. Patient states she would like to restart her [place]. Patient states she would also like another prednisone taper because she states "my hands are killing me". Please advise.

## 2023-11-12 NOTE — Telephone Encounter (Signed)
 Ok to restart arava.  Recommend continuing prednisone 5 mg daily.

## 2023-11-12 NOTE — Progress Notes (Signed)
      [address]       [name] [address]             [phone]     HPI: [name] returns for follow-up after a lingular sparing left upper lobectomy.  Khristian Seals is a 63 year old woman with a history of tobacco abuse, COPD, hepatitis C, hyperlipidemia, type 2 diabetes, rheumatoid arthritis, hypokalemia, depression, and stage Ia adenocarcinoma of the left upper lobe.  She smoked 2 packs a day for 40 years.  She quit prior to surgery and says she has not smoked since surgery.  She had a robotic assisted lingular sparing left upper lobectomy on 05/03/2023.  She went home on oxygen but weaned off that later on.  Recently saw Dr. Ellin Saba.  CT showed no evidence of recurrence.   Current Outpatient Medications  Medication Sig Dispense Refill   cetirizine (ZYRTEC) 10 MG tablet Take 10 mg by mouth at bedtime.     DULoxetine (CYMBALTA) 60 MG capsule Take 120 mg by mouth at bedtime.     furosemide (LASIX) 20 MG tablet Take 40 mg by mouth in the morning.     guaiFENesin (MUCINEX) 600 MG 12 hr tablet Take 2 tablets (1,200 mg total) by mouth 2 (two) times daily. 20 tablet 0   ibuprofen (ADVIL) 200 MG tablet Take 400 mg by mouth every 8 (eight) hours as needed for moderate pain.     leflunomide (ARAVA) 20 MG tablet TAKE 1 TABLET(20 MG) BY MOUTH DAILY 90 tablet 0   levETIRAcetam (KEPPRA XR) 500 MG 24 hr tablet Take 1 tablet (500 mg total) by mouth daily. (Patient taking differently: Take 500 mg by mouth at bedtime.) 30 tablet 0   lisinopril (ZESTRIL) 10 MG tablet Take 10 mg by mouth at bedtime.     metFORMIN (GLUCOPHAGE-XR) 500 MG 24 hr tablet Take 1,000 mg by mouth daily after supper.     Multiple Vitamin (MULTIVITAMIN) tablet Take 1 tablet by mouth at bedtime.     oxyCODONE (OXY IR/ROXICODONE) 5 MG immediate release tablet Take 1 tablet (5 mg total) by mouth 2 (two) times daily as needed for severe pain (pain score 7-10). 14 tablet 0   Potassium Chloride ER 20 MEQ TBCR  Take 20 mEq by mouth at bedtime.     predniSONE (DELTASONE) 5 MG tablet Take 1 tablet (5 mg total) by mouth daily with breakfast. 90 tablet 0   rosuvastatin (CRESTOR) 20 MG tablet Take 20 mg by mouth at bedtime.     No current facility-administered medications for this visit.    Physical Exam  Diagnostic Tests:   Impression:  Plan:   [name], MD [facility] [phone]

## 2023-11-12 NOTE — Telephone Encounter (Signed)
 Attempted to contact the patient and left message for patient to call the office.

## 2023-11-12 NOTE — Telephone Encounter (Signed)
 Patient advised ok to restart arava.  Recommend continuing prednisone 5 mg daily.

## 2023-11-13 ENCOUNTER — Other Ambulatory Visit: Payer: Self-pay | Admitting: *Deleted

## 2023-11-13 MED ORDER — PREDNISONE 5 MG PO TABS: 5.0000 mg | ORAL_TABLET | Freq: Every day | ORAL | 0 refills | Status: DC

## 2023-11-13 NOTE — Telephone Encounter (Signed)
 Patient contacted the office and requested a refill on her Prednisone 5 mg tablets.   Last Fill: 08/19/2023  Next Visit: 12/17/2023  Last Visit: 09/18/2023  Dx: Rheumatoid arthritis with rheumatoid factor of multiple sites without organ or systems involvement   Current Dose per office note on 09/18/2023: prednisone 5 mg daily   Okay to refill Prednisone?

## 2023-11-27 ENCOUNTER — Telehealth: Payer: Self-pay

## 2023-11-27 NOTE — Telephone Encounter (Signed)
 Contacted the patient, patient scheduled for tomorrow at 10:10 am.

## 2023-11-27 NOTE — Telephone Encounter (Signed)
 Please schedule sooner office visit to discuss treatment options

## 2023-11-27 NOTE — Progress Notes (Unsigned)
 Office Visit Note  Patient: Christina Jenkins             Date of Birth: April 03, 1961           MRN: [id number]             PCP: Encarnacion Slates, PA-C Referring: Encarnacion Slates, PA-C Visit Date: 11/28/2023 Occupation: @GUAROCC @  Subjective:  Pain in both hands    History of Present Illness: Christina Jenkins is a 63 y.o. female with history of rheumatoid arthritis.  Patient is currently taking arava 20 mg by mouth daily and prednisone 5 mg daily.  Patient states that she is currently awaiting a denial letter from [employer] office.  She states that she has not initiated [place] yet.  Her last dose of Humira was administered on 09/18/2023.  Patient presents today experiencing a flare involving multiple joints.  She has discomfort in the left shoulder but her pain is most severe in both hands and both wrist joints.  Her pain is currently an 8 out of 10 in both hands.  She has been taking ibuprofen on a daily basis along with prednisone 5 mg daily.  Her current flare has been severe for the past 3 days.     Activities of Daily Living:  Patient reports morning stiffness for all day. Patient Reports nocturnal pain.  Difficulty dressing/grooming: Reports Difficulty climbing stairs: Denies Difficulty getting out of chair: Denies Difficulty using hands for taps, buttons, cutlery, and/or writing: Reports  Review of Systems  Constitutional:  Negative for fatigue.  HENT:  Negative for mouth sores, mouth dryness and nose dryness.   Eyes:  Negative for pain and dryness.  Respiratory:  Negative for shortness of breath and difficulty breathing.   Cardiovascular:  Negative for chest pain and palpitations.  Gastrointestinal:  Negative for blood in stool, constipation and diarrhea.  Endocrine: Negative for increased urination.  Genitourinary:  Negative for involuntary urination.  Musculoskeletal:  Positive for joint pain, joint pain, joint swelling and morning stiffness. Negative for gait problem,  myalgias, muscle weakness, muscle tenderness and myalgias.  Skin:  Negative for color change, rash, hair loss and sensitivity to sunlight.  Allergic/Immunologic: Negative for susceptible to infections.  Neurological:  Positive for numbness. Negative for dizziness and headaches.  Hematological:  Negative for swollen glands.  Psychiatric/Behavioral:  Positive for sleep disturbance. Negative for depressed mood. The patient is not nervous/anxious.     PMFS History:  Patient Active Problem List   Diagnosis Date Noted   Recurrent UTI 10/29/2023   At risk for infection due to immunosuppression 10/29/2023   Primary adenocarcinoma of upper lobe of left lung (HCC) 06/02/2023   S/P partial lobectomy of lung 05/03/2023   Solitary pulmonary nodule on lung CT 03/02/2023   COPD GOLD ? / group A 03/02/2023   Lung nodule 03/02/2023   Chest pain of uncertain etiology 04/30/2019   Abnormal nuclear stress test    Circulatory system disorder 04/18/2019   Ischemia 04/18/2019   Hyperglycemia, unspecified 12/30/2018   Other abnormal glucose 12/30/2018   History of COPD 05/31/2017   Mixed hyperlipidemia 04/30/2017   Fatigue 12/04/2016   History of seizures 10/23/2016   History of hepatitis C 10/23/2016   ANA positive 10/23/2016   Sicca syndrome (HCC) 10/23/2016   Venous (peripheral) insufficiency 10/08/2016   Varicose veins of lower extremities with inflammation 10/08/2016   Seropositive rheumatoid arthritis (HCC) 08/01/2016   High risk medication use 08/01/2016   Hepatitis C 08/01/2016  Cigarette smoker 08/01/2016   Depression 08/01/2016   Complex partial seizure (HCC) 02/02/2016   MCI (mild cognitive impairment) 11/02/2015   Arthralgia 11/02/2015   Carpal tunnel syndrome 10/20/2015   Syncope 05/17/2015   Convulsions/seizures (HCC) 05/17/2015   Tobacco use disorder 02/23/2014   Edema 09/22/2013   Osteoarthrosis 06/11/2011   Hypertension 12/20/2010   Hypokalemia 12/20/2010    Past Medical  History:  Diagnosis Date   Adenocarcinoma of upper lobe of left lung (HCC)    Carpal tunnel syndrome    COPD (chronic obstructive pulmonary disease) (HCC)    Coronary artery calcification seen on CT scan    Normal coronaries at cardiac catheterization 2020   Depression    Essential hypertension    Hepatitis C    Hypokalemia    Mixed hyperlipidemia    Rheumatoid arthritis (HCC)    Rheumatoid arthritis (HCC)    Seasonal allergies    Type 2 diabetes mellitus (HCC)    Varicose veins of legs     Family History  Problem Relation Age of Onset   Diabetes Mother    Pancreatic cancer Mother    Diabetes Father    COPD Father    Dementia Father    Diabetes Brother    Past Surgical History:  Procedure Laterality Date   BACK SURGERY     BRONCHIAL BIOPSY  04/02/2023   Procedure: BRONCHIAL BIOPSIES;  Surgeon: Josephine Igo, DO;  Location: [facility];  Service: Pulmonary;;   BRONCHIAL BRUSHINGS  04/02/2023   Procedure: BRONCHIAL BRUSHINGS;  Surgeon: Josephine Igo, DO;  Location: [facility];  Service: Pulmonary;;   BRONCHIAL NEEDLE ASPIRATION BIOPSY  04/02/2023   Procedure: BRONCHIAL NEEDLE ASPIRATION BIOPSIES;  Surgeon: Josephine Igo, DO;  Location: [facility];  Service: Pulmonary;;   FIDUCIAL MARKER PLACEMENT  04/02/2023   Procedure: FIDUCIAL MARKER PLACEMENT;  Surgeon: Josephine Igo, DO;  Location: [facility];  Service: Pulmonary;;   INTERCOSTAL NERVE BLOCK Left 05/03/2023   Procedure: INTERCOSTAL NERVE BLOCK;  Surgeon: [name], MD;  Location: [facility] OR;  Service: Thoracic;  Laterality: Left;   LEFT HEART CATH AND CORONARY ANGIOGRAPHY N/A 04/30/2019   Procedure: LEFT HEART CATH AND CORONARY ANGIOGRAPHY;  Surgeon: Runell Gess, MD;  Location: [facility];  Service: Cardiovascular;  Laterality: N/A;   NODE DISSECTION Left 05/03/2023   Procedure: NODE DISSECTION;  Surgeon: [name], MD;  Location[facility];  Service: Thoracic;  Laterality: Left;    TONSILLECTOMY     TUBAL LIGATION     Social History   Social History Narrative   1 biological child, 1 stepchild   Immunization History  Administered Date(s) Administered   Influenza,inj,Quad PF,6+ Mos 05/16/2017, 05/15/2018, 05/09/2019   Influenza-Unspecified 05/30/2023   Moderna Sars-Covid-2 Vaccination 11/16/2019, 12/14/2019, 08/11/2020, 05/30/2023   Tdap 06/26/2018   Zoster Recombinant(Shingrix) 09/04/2017     Objective: Vital Signs: BP 121/79 (BP Location: Left Arm, Patient Position: Sitting, Cuff Size: Normal)   Pulse 74   Resp 15   Ht 5\' 6"  (1.676 m)   Wt 178 lb (80.7 kg)   BMI 28.73 kg/m    Physical Exam Vitals and nursing note reviewed.  Constitutional:      Appearance: She is well-developed.  HENT:     Head: Normocephalic and atraumatic.  Eyes:     Conjunctiva/sclera: Conjunctivae normal.  Cardiovascular:     Rate and Rhythm: Normal rate and regular rhythm.     Heart sounds: Normal heart sounds.  Pulmonary:  Effort: Pulmonary effort is normal.     Breath sounds: Normal breath sounds.  Abdominal:     General: Bowel sounds are normal.     Palpations: Abdomen is soft.  Musculoskeletal:     Cervical back: Normal range of motion.  Lymphadenopathy:     Cervical: No cervical adenopathy.  Skin:    General: Skin is warm and dry.     Capillary Refill: Capillary refill takes less than 2 seconds.  Neurological:     Mental Status: She is alert and oriented to person, place, and time.  Psychiatric:        Behavior: Behavior normal.      Musculoskeletal Exam: C-spine has good range of motion.  Discomfort with range of motion particularly in the left shoulder.  Elbow joints have good range of motion.  Extensor tenosynovitis of both wrist joints.  Tenderness and synovitis of several MCP joints as described below.  Hip joints have good range of motion with no groin pain.  Knee joints have good range of motion no warmth or effusion.  Ankle joints have good range  of motion with no tenderness or joint swelling.  CDAI Exam: CDAI Score: -- Patient Global: --; Provider Global: -- Swollen: 7 ; Tender: 8  Joint Exam 11/28/2023      Right  Left  Glenohumeral      Tender  MCP 1  Swollen Tender  Swollen Tender  MCP 2  Swollen Tender  Swollen Tender  MCP 3  Swollen Tender  Swollen Tender  MCP 5     Swollen Tender     Investigation: No additional findings.  Imaging: CT CHEST W CONTRAST Result Date: 11/06/2023 CLINICAL DATA:  Non-small cell lung cancer.  * Tracking Code: BO * EXAM: CT CHEST WITH CONTRAST TECHNIQUE: Multidetector CT imaging of the chest was performed during intravenous contrast administration. RADIATION DOSE REDUCTION: This exam was performed according to the departmental dose-optimization program which includes automated exposure control, adjustment of the mA and/or kV according to patient size and/or use of iterative reconstruction technique. CONTRAST:  75mL OMNIPAQUE IOHEXOL 300 MG/ML  SOLN COMPARISON:  03/22/2023. FINDINGS: Cardiovascular: Atherosclerotic calcification of the aorta with likely age advanced involvement of the left anterior descending coronary artery. Heart is at the upper limits of normal in size. No pericardial effusion. Mediastinum/Nodes: 9 mm low right paratracheal lymph node, unchanged. No pathologically enlarged mediastinal, hilar or axillary lymph nodes. Esophagus is grossly unremarkable. Lungs/Pleura: Centrilobular and paraseptal emphysema. Multiple calcified granulomas. Interval left upper lobe wedge resection with partial collapse/consolidation in the left upper lobe. Lungs are otherwise clear. No pleural fluid. Airway is unremarkable. Upper Abdomen: Elevated left hemidiaphragm. Visualized portions of the liver, adrenal glands, kidneys, spleen, pancreas, stomach and bowel are grossly unremarkable. No upper abdominal adenopathy. Musculoskeletal: Degenerative changes in the spine. No worrisome lytic or sclerotic lesions.  IMPRESSION: 1. Interval left upper lobe wedge resection with collapse/consolidation in the left upper lobe. No evidence of metastatic disease. 2. Age advanced left anterior descending coronary artery calcification. 3.  Aortic atherosclerosis (ICD10-I70.0). 4.  Emphysema (ICD10-J43.9). Electronically Signed   By: Leanna Battles M.D.   On: 11/06/2023 13:41    Recent Labs: Lab Results  Component Value Date   WBC 11.2 (H) 10/30/2023   HGB 12.5 10/30/2023   PLT 249 10/30/2023   NA 140 10/30/2023   K 3.7 10/30/2023   CL 102 10/30/2023   CO2 23 10/30/2023   GLUCOSE 174 (H) 10/30/2023   BUN 13 10/30/2023  CREATININE 1.02 (H) 10/30/2023   BILITOT 0.5 10/30/2023   ALKPHOS 67 10/30/2023   AST 18 10/30/2023   ALT 19 10/30/2023   PROT 7.1 10/30/2023   ALBUMIN 3.9 10/30/2023   CALCIUM 9.6 10/30/2023   GFRAA 91 12/09/2019   QFTBGOLDPLUS NEGATIVE 12/09/2019    Speciality Comments: Need Hep C quant every 6 months Prior therapy: methotrexate (oral ulcers) and Cimzia (inadequate response) , Enbrel stopped Jan 2023 Humira started 09/27/21  TB Gold: Negative 01/21/2023  Procedures:  No procedures performed Allergies: Patient has no known allergies.    Assessment / Plan:     Visit Diagnoses: Rheumatoid arthritis with rheumatoid factor of multiple sites without organ or systems involvement [facility]): Patient presents today experiencing a flare involving multiple joints.  For the past 3 days she has had severe pain and inflammation involving both hands and both wrist joints.  Extensor tenosynovitis of both wrist noted along with tenderness and inflammation involving several MCP joints as described above.  Patient is currently taking Arava 20 mg 1 tablet by mouth daily and prednisone 5 mg daily. The patient's last dose of Humira was administered on 09/18/2023-inadequate response to humira. She is awaiting the LIS denial letter to be approved for patient assistance for [facility].  Patient has been trying to  contact the Social Security office every other day but was advised to go to the Social Security office in person to try to expedite the process.   A prednisone taper starting at 20 mg tapering by 5 mg every 7 days was sent to the pharmacy today.  When she completes the taper she will remain on prednisone 5 mg daily as a maintenance dose.  She will remain on Arava as prescribed.  She will follow up in 3 months or sooner if needed.    High risk medication use - Arava 20 mg 1 tablet by mouth daily and Prednisone 5 mg daily.  Plan is for her to initiate [employer] pending patient assistance Humira was started on 09/27/2021--her last dose of Humira was administered on 09/18/2023. Previous tx: Enbrel, cimzia, Actemra, methotrexate. Last dose of humira was administered on 09/18/23.  CBC and CMP updated on 10/30/23.  Her next lab work be due in May and every 3 months to monitor for drug toxicity. CXR 07/02/23.   Long term (current) use of systemic steroids: Patient remains on prednisone 5 mg 1 tablet daily.  She is aware of the risks of long-term prednisone use.  A prednisone taper starting at 20 mg tapering by 5 mg every 7 days was sent to the pharmacy today.  Chronic pain of both shoulders: Patient has discomfort range of motion of both shoulders.  Patient has intermittent comfort in the left shoulder but overall her symptoms have been manageable compared to the pain in her hands.  Primary osteoarthritis of both hands: PIP and DIP thickening.  She is currently experiencing a rheumatoid arthritis flare.  She has extensor tenosynovitis involving both wrists as well as inflammation involving several MCP joints as described above.  A prednisone taper starting at 20 mg tapering by 5 mg every 7 days was sent to the pharmacy.  Primary osteoarthritis of both feet: She is not experiencing any discomfort in her feet at this time.  She has good range of motion of both ankle joints with no tenderness or joint swelling.   She is wearing proper fitting shoes.  Trochanteric bursitis of both hips: Not currently symptomatic.  ANA positive: No clinical features of systemic  lupus.  Other medical conditions are listed as follows:  History of hepatitis C - HCV quant nonreactive on 11/03/21. Hepatitis C antibody negative on 02/12/22. HCV antibody negative on 10/10/22.Hep C quant not detected on 09/16/2023.  History of COPD  History of seizures   Former smoker  Orders: No orders of the defined types were placed in this encounter.  Meds ordered this encounter  Medications   predniSONE (DELTASONE) 5 MG tablet    Sig: Take 4 tabs po x 7 days, 3  tabs po x 7 days, 2  tabs po x 7 days, 1  tab po x 7 days    Dispense:  70 tablet    Refill:  0    Follow-Up Instructions: Return in about 3 months (around 02/28/2024).   Gearldine Bienenstock, PA-C  Note - This record has been created using Dragon software.  Chart creation errors have been sought, but may not always  have been located. Such creation errors do not reflect on  the standard of medical care.

## 2023-11-27 NOTE — Telephone Encounter (Signed)
 Patient contacted the office and states she is having left wrist and left forearm swelling and pain. Patient states she is having right hand pain. Patient states the pain and swelling started yesterday morning and has not gone away. Patient states she is taking ibuprofen and her 5 mg prednisone and it has not helped. Patient states she believes she needs a prednisone taper. Please advise.

## 2023-11-28 ENCOUNTER — Encounter: Payer: Self-pay | Admitting: Physician Assistant

## 2023-11-28 ENCOUNTER — Ambulatory Visit: Attending: Physician Assistant | Admitting: Physician Assistant

## 2023-11-28 VITALS — BP 121/79 | HR 74 | Resp 15 | Ht 66.0 in | Wt 178.0 lb

## 2023-11-28 DIAGNOSIS — R768 Other specified abnormal immunological findings in serum: Secondary | ICD-10-CM

## 2023-11-28 DIAGNOSIS — Z8709 Personal history of other diseases of the respiratory system: Secondary | ICD-10-CM

## 2023-11-28 DIAGNOSIS — M19071 Primary osteoarthritis, right ankle and foot: Secondary | ICD-10-CM

## 2023-11-28 DIAGNOSIS — M19072 Primary osteoarthritis, left ankle and foot: Secondary | ICD-10-CM

## 2023-11-28 DIAGNOSIS — Z87898 Personal history of other specified conditions: Secondary | ICD-10-CM | POA: Diagnosis not present

## 2023-11-28 DIAGNOSIS — R7689 Other specified abnormal immunological findings in serum: Secondary | ICD-10-CM

## 2023-11-28 DIAGNOSIS — Z79899 Other long term (current) drug therapy: Secondary | ICD-10-CM

## 2023-11-28 DIAGNOSIS — Z7952 Long term (current) use of systemic steroids: Secondary | ICD-10-CM

## 2023-11-28 DIAGNOSIS — Z8619 Personal history of other infectious and parasitic diseases: Secondary | ICD-10-CM

## 2023-11-28 DIAGNOSIS — M25511 Pain in right shoulder: Secondary | ICD-10-CM | POA: Diagnosis not present

## 2023-11-28 DIAGNOSIS — M19041 Primary osteoarthritis, right hand: Secondary | ICD-10-CM | POA: Diagnosis not present

## 2023-11-28 DIAGNOSIS — G8929 Other chronic pain: Secondary | ICD-10-CM

## 2023-11-28 DIAGNOSIS — M7061 Trochanteric bursitis, right hip: Secondary | ICD-10-CM | POA: Diagnosis not present

## 2023-11-28 DIAGNOSIS — M7062 Trochanteric bursitis, left hip: Secondary | ICD-10-CM

## 2023-11-28 DIAGNOSIS — Z87891 Personal history of nicotine dependence: Secondary | ICD-10-CM

## 2023-11-28 DIAGNOSIS — M0579 Rheumatoid arthritis with rheumatoid factor of multiple sites without organ or systems involvement: Secondary | ICD-10-CM

## 2023-11-28 DIAGNOSIS — M19042 Primary osteoarthritis, left hand: Secondary | ICD-10-CM

## 2023-11-28 DIAGNOSIS — M25512 Pain in left shoulder: Secondary | ICD-10-CM

## 2023-11-28 MED ORDER — PREDNISONE 5 MG PO TABS: ORAL_TABLET | ORAL | 0 refills | Status: DC

## 2023-11-28 NOTE — Addendum Note (Signed)
 Addended by: Ellen Henri on: 11/28/2023 01:03 PM   Modules accepted: Orders

## 2023-12-12 ENCOUNTER — Telehealth: Payer: Self-pay | Admitting: *Deleted

## 2023-12-12 NOTE — Telephone Encounter (Signed)
 Patient contacted the office stating she received a letter from social security. Patient states the letter advised she was denied for the  [employer] supplemental benefits. Patient is going to fax a copy of the letter.

## 2023-12-12 NOTE — Telephone Encounter (Signed)
 Noted; will await fax

## 2023-12-13 ENCOUNTER — Other Ambulatory Visit: Payer: Self-pay | Admitting: *Deleted

## 2023-12-13 MED ORDER — PREDNISONE 5 MG PO TABS: ORAL_TABLET | ORAL | 0 refills | Status: DC

## 2023-12-13 NOTE — Addendum Note (Signed)
 Addended by: Henriette Combs on: 12/13/2023 01:47 PM   Modules accepted: Orders

## 2023-12-13 NOTE — Telephone Encounter (Signed)
 I returned patient's call.  Patient states she continues to have pain and swelling in her joints.  She ran out of prednisone.  She is awaiting the prescription for Kevzara.  Please send a prednisone taper starting at 20 mg and taper by 5 mg every 7 days.   Please discuss side effects of prednisone including the risk of hypertension, diabetes, weight gain, heart disease and cataracts.

## 2023-12-13 NOTE — Telephone Encounter (Signed)
 Patient contacted the office stating she is having a lot of increased pain in her hands and her shoulders. Patient states she is not have any swelling just pain. Patient states she is on Prednisone 5 mg daily. Patient states that she has been taking Ibuprofen and it is not helping either. Patient advised she should not be using NSAIDS with prednisone. Patient states she has not been able to start the [facility] yet. Patient would like to know what she can do for the pain in her hands. Please advise.

## 2023-12-13 NOTE — Telephone Encounter (Signed)
 Patient advised Dr. Corliss Skains is sending in a prednisone taper to the pharmacy. Patient advised side effects of prednisone including the risk of hypertension, diabetes, weight gain, heart disease and cataracts.

## 2023-12-13 NOTE — Telephone Encounter (Signed)
 LIS denial letter received. Faxed to [facility] Patient Assistance Program  Chesley Mires, PharmD, MPH, BCPS, CPP Clinical Pharmacist (Rheumatology and Pulmonology)

## 2023-12-16 ENCOUNTER — Other Ambulatory Visit: Payer: Self-pay | Admitting: Thoracic Surgery (Cardiothoracic Vascular Surgery)

## 2023-12-16 MED ORDER — OXYCODONE HCL 5 MG PO TABS: 5.0000 mg | ORAL_TABLET | Freq: Two times a day (BID) | ORAL | 0 refills | Status: DC | PRN

## 2023-12-16 NOTE — Progress Notes (Signed)
 Refilled oxycodone 5 mg PO BID PRN, #[id number], no refills  Landon Pinion C. Luna Salinas, MD [facility] [phone]

## 2023-12-17 ENCOUNTER — Ambulatory Visit: Payer: Medicare Other | Admitting: Physician Assistant

## 2023-12-26 ENCOUNTER — Telehealth: Payer: Self-pay | Admitting: *Deleted

## 2023-12-26 NOTE — Telephone Encounter (Signed)
 Patient contacted the office to inquire if we have heard from the [place]evz[place] patient assistance program. Patient states she has not had an injection of Humira  since September 18, 2023. Patient states she has not been able to start the Kevzara as she is awaiting approval from patient assistance program. Patient states she is having trouble with her hands swelling, redness, pain and going numb. Patient states the prednisone  is not helping. Patient states she is currently taking Prednisone  10 mg daily. Patient states she is taking her Arava  as prescribed. Please advise.

## 2023-12-26 NOTE — Telephone Encounter (Signed)
 Please schedule visit to discuss adding on an additional DMARD-plaquenil since it remains unclear when she will be able to reinitiate a biologic. Please schedule while Devki is in the office.

## 2023-12-27 ENCOUNTER — Other Ambulatory Visit: Payer: Self-pay | Admitting: Thoracic Surgery (Cardiothoracic Vascular Surgery)

## 2023-12-27 ENCOUNTER — Telehealth: Payer: Self-pay

## 2023-12-27 MED ORDER — OXYCODONE HCL 5 MG PO TABS: 5.0000 mg | ORAL_TABLET | Freq: Two times a day (BID) | ORAL | 0 refills | Status: DC | PRN

## 2023-12-27 NOTE — Telephone Encounter (Signed)
 Patient advised we will need to schedule a visit to discuss adding on an additional DMARD-plaquenil since it remains unclear when she will be able to reinitiate a biologic. Patient scheduled for 01/06/2024.

## 2023-12-27 NOTE — Progress Notes (Signed)
 Refilled oxycodone  5 mg PO BID PRN # [id number], no refills  Tonie Vizcarrondo C. Luna Salinas, MD [facility] [phone]

## 2023-12-27 NOTE — Telephone Encounter (Signed)
 Patient contacted the office requesting refill of pain medication. She has a appointment with the pain clinic in May. She is wanting another refill until the appt. Dr. Luna Salinas aware and sent prescription into patient's preferred pharmacy. Patient aware and acknowledged receipt.

## 2023-12-27 NOTE — Telephone Encounter (Signed)
-----   Message from Zelphia Higashi sent at 12/27/2023  2:43 PM EDT ----- Regarding: RE: Pain medication refill Done ----- Message ----- From: Barbra Ley, RN Sent: 12/27/2023   2:13 PM EDT To: Zelphia Higashi, MD Subject: Pain medication refill                         Hey,  She is requesting another refill of pain medication. She states she has pain clinic appt in May. Please advise.  Preferred pharmacy:  [facility] [phone] - [place],  - [address] AT [facility] [address]  Phone: [phone] Fax: [phone]     Thanks, Odilia Bennett

## 2023-12-30 NOTE — Progress Notes (Signed)
 Office Visit Note  Patient: Christina Jenkins             Date of Birth: February 21, 1961           MRN: [id number]             PCP: Anthonette Bastos, PA-C Referring: Anthonette Bastos, PA-C Visit Date: 01/03/2024 Occupation: @GUAROCC @  Subjective:  Pain in hands and shoulders  History of Present Illness: Christina Jenkins is a 62 y.o. female with seropositive rheumatoid arthritis returns for a follow-up visit after her last visit in March 2025.  She states she has been taking leflunomide  20 mg daily with prednisone  5 mg a day.  Best despite of taking these medicines she has not had any relief.  She continues to have pain and discomfort in her shoulders and her hands and her wrists.  She has been off Humira  since September 18, 2023 due to insurance coverage.  She could not get Kevzara started yet.    Activities of Daily Living:  Patient reports morning stiffness for 24 hours.   Patient Reports nocturnal pain.  Difficulty dressing/grooming: Reports Difficulty climbing stairs: Denies Difficulty getting out of chair: Reports Difficulty using hands for taps, buttons, cutlery, and/or writing: Reports  Review of Systems  Constitutional:  Negative for fatigue.  HENT:  Negative for mouth sores and mouth dryness.   Eyes:  Negative for dryness.  Respiratory:  Negative for shortness of breath.   Cardiovascular:  Negative for chest pain and palpitations.  Gastrointestinal:  Negative for blood in stool, constipation and diarrhea.  Endocrine: Negative for increased urination.  Genitourinary:  Negative for involuntary urination.  Musculoskeletal:  Positive for joint pain, joint pain, joint swelling and morning stiffness. Negative for gait problem, myalgias, muscle weakness, muscle tenderness and myalgias.  Skin:  Negative for color change, rash, hair loss and sensitivity to sunlight.  Allergic/Immunologic: Negative for susceptible to infections.  Neurological:  Negative for dizziness and headaches.  Hematological:   Negative for swollen glands.  Psychiatric/Behavioral:  Negative for depressed mood and sleep disturbance. The patient is not nervous/anxious.     PMFS History:  Patient Active Problem List   Diagnosis Date Noted   Recurrent UTI 10/29/2023   At risk for infection due to immunosuppression 10/29/2023   Primary adenocarcinoma of upper lobe of left lung (HCC) 06/02/2023   S/P partial lobectomy of lung 05/03/2023   Solitary pulmonary nodule on lung CT 03/02/2023   COPD GOLD ? / group A 03/02/2023   Lung nodule 03/02/2023   Chest pain of uncertain etiology 04/30/2019   Abnormal nuclear stress test    Circulatory system disorder 04/18/2019   Ischemia 04/18/2019   Hyperglycemia, unspecified 12/30/2018   Other abnormal glucose 12/30/2018   History of COPD 05/31/2017   Mixed hyperlipidemia 04/30/2017   Fatigue 12/04/2016   History of seizures 10/23/2016   History of hepatitis C 10/23/2016   ANA positive 10/23/2016   Sicca syndrome (HCC) 10/23/2016   Venous (peripheral) insufficiency 10/08/2016   Varicose veins of lower extremities with inflammation 10/08/2016   Seropositive rheumatoid arthritis (HCC) 08/01/2016   High risk medication use 08/01/2016   Hepatitis C 08/01/2016   Cigarette smoker 08/01/2016   Depression 08/01/2016   Complex partial seizure (HCC) 02/02/2016   MCI (mild cognitive impairment) 11/02/2015   Arthralgia 11/02/2015   Carpal tunnel syndrome 10/20/2015   Syncope 05/17/2015   Convulsions/seizures (HCC) 05/17/2015   Tobacco use disorder 02/23/2014   Edema 09/22/2013   Osteoarthrosis  06/11/2011   Hypertension 12/20/2010   Hypokalemia 12/20/2010    Past Medical History:  Diagnosis Date   Adenocarcinoma of upper lobe of left lung (HCC)    Carpal tunnel syndrome    COPD (chronic obstructive pulmonary disease) (HCC)    Coronary artery calcification seen on CT scan    Normal coronaries at cardiac catheterization 2020   Depression    Essential hypertension     Hepatitis C    Hypokalemia    Mixed hyperlipidemia    Rheumatoid arthritis (HCC)    Rheumatoid arthritis (HCC)    Seasonal allergies    Type 2 diabetes mellitus (HCC)    Varicose veins of legs     Family History  Problem Relation Age of Onset   Diabetes Mother    Pancreatic cancer Mother    Diabetes Father    COPD Father    Dementia Father    Diabetes Brother    Past Surgical History:  Procedure Laterality Date   BACK SURGERY     BRONCHIAL BIOPSY  04/02/2023   Procedure: BRONCHIAL BIOPSIES;  Surgeon: Prudy Brownie, DO;  Location: [facility];  Service: Pulmonary;;   BRONCHIAL BRUSHINGS  04/02/2023   Procedure: BRONCHIAL BRUSHINGS;  Surgeon: Prudy Brownie, DO;  Location: [facility];  Service: Pulmonary;;   BRONCHIAL NEEDLE ASPIRATION BIOPSY  04/02/2023   Procedure: BRONCHIAL NEEDLE ASPIRATION BIOPSIES;  Surgeon: Prudy Brownie, DO;  Location: [facility];  Service: Pulmonary;;   FIDUCIAL MARKER PLACEMENT  04/02/2023   Procedure: FIDUCIAL MARKER PLACEMENT;  Surgeon: Prudy Brownie, DO;  Location: [facility];  Service: Pulmonary;;   INTERCOSTAL NERVE BLOCK Left 05/03/2023   Procedure: INTERCOSTAL NERVE BLOCK;  Surgeon: Zelphia Higashi, MD;  Location: [facility];  Service: Thoracic;  Laterality: Left;   LEFT HEART CATH AND CORONARY ANGIOGRAPHY N/A 04/30/2019   Procedure: LEFT HEART CATH AND CORONARY ANGIOGRAPHY;  Surgeon: Avanell Leigh, MD;  Location: [facility];  Service: Cardiovascular;  Laterality: N/A;   NODE DISSECTION Left 05/03/2023   Procedure: NODE DISSECTION;  Surgeon: Zelphia Higashi, MD;  Location: [facility] OR;  Service: Thoracic;  Laterality: Left;   TONSILLECTOMY     TUBAL LIGATION     Social History   Social History Narrative   1 biological child, 1 stepchild   Immunization History  Administered Date(s) Administered   Influenza,inj,Quad PF,6+ Mos 05/16/2017, 05/15/2018, 05/09/2019   Influenza-Unspecified 05/30/2023   Moderna  Sars-Covid-2 Vaccination 11/16/2019, 12/14/2019, 08/11/2020, 05/30/2023   Tdap 06/26/2018   Zoster Recombinant(Shingrix) 09/04/2017     Objective: Vital Signs: BP (!) 105/58 (BP Location: Right Arm, Patient Position: Sitting, Cuff Size: Normal)   Pulse 80   Resp 16   Ht 5\' 6"  (1.676 m)   Wt 182 lb (82.6 kg)   BMI 29.38 kg/m    Physical Exam Vitals and nursing note reviewed.  Constitutional:      Appearance: She is well-developed.  HENT:     Head: Normocephalic and atraumatic.  Eyes:     Conjunctiva/sclera: Conjunctivae normal.  Cardiovascular:     Rate and Rhythm: Normal rate and regular rhythm.     Heart sounds: Normal heart sounds.  Pulmonary:     Effort: Pulmonary effort is normal.     Breath sounds: Normal breath sounds.  Abdominal:     General: Bowel sounds are normal.     Palpations: Abdomen is soft.  Musculoskeletal:     Cervical back: Normal range of motion.  Lymphadenopathy:  Cervical: No cervical adenopathy.  Skin:    General: Skin is warm and dry.     Capillary Refill: Capillary refill takes less than 2 seconds.  Neurological:     Mental Status: She is alert and oriented to person, place, and time.  Psychiatric:        Behavior: Behavior normal.      Musculoskeletal Exam: She had discomfort with range of motion of the cervical spine.  She had painful range of motion bilateral shoulders.  Elbow joints and wrist joints were in good range of motion.  She has synovitis of her MCP joints as described below.  She had painful range of motion of bilateral knee joints.  There was no tenderness over ankles or MTPs.  She had a rheumatoid nodule over the lateral aspect of her left fifth MTP joint.  CDAI Exam: CDAI Score: 26  Patient Global: 70 / 100; Provider Global: 50 / 100 Swollen: 4 ; Tender: 10  Joint Exam 01/03/2024      Right  Left  Glenohumeral   Tender   Tender  Wrist   Tender   Tender  MCP 1  Swollen Tender  Swollen Tender  MCP 2  Swollen Tender   Swollen Tender  Knee   Tender   Tender     Investigation: No additional findings.  Imaging: No results found.  Recent Labs: Lab Results  Component Value Date   WBC 11.2 (H) 10/30/2023   HGB 12.5 10/30/2023   PLT 249 10/30/2023   NA 140 10/30/2023   K 3.7 10/30/2023   CL 102 10/30/2023   CO2 23 10/30/2023   GLUCOSE 174 (H) 10/30/2023   BUN 13 10/30/2023   CREATININE 1.02 (H) 10/30/2023   BILITOT 0.5 10/30/2023   ALKPHOS 67 10/30/2023   AST 18 10/30/2023   ALT 19 10/30/2023   PROT 7.1 10/30/2023   ALBUMIN 3.9 10/30/2023   CALCIUM  9.6 10/30/2023   GFRAA 91 12/09/2019   QFTBGOLDPLUS NEGATIVE 12/09/2019    Speciality Comments: Need Hep C quant every 6 months Prior therapy: methotrexate (oral ulcers) and Cimzia  (inadequate response) , Enbrel  stopped Jan 2023 Humira  started 09/27/21  TB Gold: Negative 01/21/2023  Procedures:  No procedures performed Allergies: Patient has no known allergies.   Assessment / Plan:     Visit Diagnoses: Rheumatoid arthritis with rheumatoid factor of multiple sites without organ or systems involvement (HCC)-patient is experiencing a flare of rheumatoid arthritis with increased pain and swelling in multiple joints as described above.  She had been off Humira  since mid January.  Kevzara could not be approved through the patient assistance so far.  She is on Areva 20 mg p.o. daily which is not controlling her symptoms.  She has been also taking prednisone  5 mg p.o. daily.  I discussed use of hydroxychloroquine.  Indications side effects were discussed.  A handout on hydroxychloroquine was given and consent was taken.  Will start her on hydroxychloroquine 200 mg p.o. twice daily.  She was advised to get a baseline eye examination and then annual eye examination.  Prescription refill for Plaquenil will also be sent.  She was advised to get labs today, in 1 month and then every 3 months.  Patient was counseled on the purpose, proper use, and adverse  effects of hydroxychloroquine including nausea/diarrhea, skin rash, headaches, and sun sensitivity.  Advised patient to wear sunscreen once starting hydroxychloroquine to reduce risk of rash associated with sun sensitivity.  Discussed importance of annual eye exams while  on hydroxychloroquine to monitor to ocular toxicity and discussed importance of frequent laboratory monitoring.  Provided patient with eye exam form for baseline ophthalmologic exam.  Provided patient with educational materials on hydroxychloroquine and answered all questions.  Patient consented to hydroxychloroquine. Will upload consent in the media tab.    Reviewed risk for QTC prolongation when used in combination with other QTc prolonging agents (including but not limited to antiarrhythmics, macrolide antibiotics, flouroquinolone antibiotics, haloperidol, quetiapine, olanzapine, risperidone, droperidol, ziprasidone, amitriptyline, citalopram, ondansetron , migraine triptans, and methadone).   High risk medication use - Arava  20 mg 1 tablet by mouth daily and Prednisone  5 mg daily. - Plan: CBC with Differential/Platelet, Comprehensive metabolic panel with GFR today, 1 month and then every 3 months.  Information for immunization was placed in the AVS.  She was advised to hold Areva if she develops an infection resume after the infection resolves.  Rheumatoid nodule (HCC)-rheumatoid nodules are noted over the lateral aspect of her left foot.  Patient wants referral to podiatrist.  Long term (current) use of systemic steroids - prednisone  5 mg 1 tablet daily.  She is unable to taper prednisone  due to ongoing inflammation.  Will refill prednisone  today.  Chronic pain of both shoulders-she has been having pain and discomfort in the bilateral shoulders.  She good range of motion with discomfort.  Primary osteoarthritis of both hands-she has rheumatoid arthritis and osteoarthritis overlap with synovitis in some of the joints as described  above.  Primary osteoarthritis of both feet-she has intermittent discomfort.  She is having discomfort in her left foot due to rheumatoid nodule.  Will refer her to podiatrist.  Trochanteric bursitis of both hips-she has not done discomfort.  ANA positive  History of hepatitis C - HCV quant nonreactive on 11/03/21. Hepatitis C antibody negative on 02/12/22. HCV antibody negative on 10/10/22.Hep C quant not detected on 09/16/2023.  History of COPD-patient states that she is not a smoking and her husband still smokes.  Risk of secondary smoking and worsening of rheumatoid arthritis was discussed.  History of seizures  Former smoker-patient states that she is not smoking getting secondary smoking from her husband.  Orders: Orders Placed This Encounter  Procedures   CBC with Differential/Platelet   Comprehensive metabolic panel with GFR   Ambulatory referral to Podiatry   Meds ordered this encounter  Medications   hydroxychloroquine (PLAQUENIL) 200 MG tablet    Sig: Take 1 tablet (200 mg total) by mouth 2 (two) times daily.    Dispense:  60 tablet    Refill:  2   predniSONE  (DELTASONE ) 5 MG tablet    Sig: Take 1 tablet (5 mg total) by mouth daily with breakfast.    Dispense:  30 tablet    Refill:  2     Follow-Up Instructions: Return in about 3 months (around 04/04/2024) for Rheumatoid arthritis.   Nicholas Bari, MD  Note - This record has been created using Animal nutritionist.  Chart creation errors have been sought, but may not always  have been located. Such creation errors do not reflect on  the standard of medical care.

## 2024-01-03 ENCOUNTER — Telehealth: Payer: Self-pay | Admitting: *Deleted

## 2024-01-03 ENCOUNTER — Ambulatory Visit: Attending: Rheumatology | Admitting: Rheumatology

## 2024-01-03 ENCOUNTER — Encounter: Payer: Self-pay | Admitting: Rheumatology

## 2024-01-03 ENCOUNTER — Other Ambulatory Visit: Payer: Self-pay | Admitting: Thoracic Surgery (Cardiothoracic Vascular Surgery)

## 2024-01-03 VITALS — BP 105/58 | HR 80 | Resp 16 | Ht 66.0 in | Wt 182.0 lb

## 2024-01-03 DIAGNOSIS — Z79899 Other long term (current) drug therapy: Secondary | ICD-10-CM | POA: Diagnosis not present

## 2024-01-03 DIAGNOSIS — G8929 Other chronic pain: Secondary | ICD-10-CM

## 2024-01-03 DIAGNOSIS — Z8619 Personal history of other infectious and parasitic diseases: Secondary | ICD-10-CM | POA: Diagnosis not present

## 2024-01-03 DIAGNOSIS — M0630 Rheumatoid nodule, unspecified site: Secondary | ICD-10-CM

## 2024-01-03 DIAGNOSIS — M7061 Trochanteric bursitis, right hip: Secondary | ICD-10-CM

## 2024-01-03 DIAGNOSIS — Z87891 Personal history of nicotine dependence: Secondary | ICD-10-CM

## 2024-01-03 DIAGNOSIS — M0579 Rheumatoid arthritis with rheumatoid factor of multiple sites without organ or systems involvement: Secondary | ICD-10-CM | POA: Diagnosis not present

## 2024-01-03 DIAGNOSIS — M25511 Pain in right shoulder: Secondary | ICD-10-CM

## 2024-01-03 DIAGNOSIS — M19042 Primary osteoarthritis, left hand: Secondary | ICD-10-CM

## 2024-01-03 DIAGNOSIS — M25512 Pain in left shoulder: Secondary | ICD-10-CM

## 2024-01-03 DIAGNOSIS — R768 Other specified abnormal immunological findings in serum: Secondary | ICD-10-CM

## 2024-01-03 DIAGNOSIS — M19041 Primary osteoarthritis, right hand: Secondary | ICD-10-CM | POA: Diagnosis not present

## 2024-01-03 DIAGNOSIS — M19072 Primary osteoarthritis, left ankle and foot: Secondary | ICD-10-CM

## 2024-01-03 DIAGNOSIS — Z87898 Personal history of other specified conditions: Secondary | ICD-10-CM | POA: Diagnosis not present

## 2024-01-03 DIAGNOSIS — Z7952 Long term (current) use of systemic steroids: Secondary | ICD-10-CM

## 2024-01-03 DIAGNOSIS — M19071 Primary osteoarthritis, right ankle and foot: Secondary | ICD-10-CM | POA: Diagnosis not present

## 2024-01-03 DIAGNOSIS — R7689 Other specified abnormal immunological findings in serum: Secondary | ICD-10-CM

## 2024-01-03 DIAGNOSIS — M7062 Trochanteric bursitis, left hip: Secondary | ICD-10-CM

## 2024-01-03 DIAGNOSIS — Z8709 Personal history of other diseases of the respiratory system: Secondary | ICD-10-CM | POA: Diagnosis not present

## 2024-01-03 MED ORDER — PREDNISONE 5 MG PO TABS
5.0000 mg | ORAL_TABLET | Freq: Every day | ORAL | 2 refills | Status: DC
Start: 2024-01-03 — End: 2024-03-04

## 2024-01-03 MED ORDER — OXYCODONE HCL 5 MG PO TABS: 5.0000 mg | ORAL_TABLET | Freq: Three times a day (TID) | ORAL | 0 refills | Status: DC | PRN

## 2024-01-03 MED ORDER — HYDROXYCHLOROQUINE SULFATE 200 MG PO TABS: 200.0000 mg | ORAL_TABLET | Freq: Two times a day (BID) | ORAL | 2 refills | Status: AC

## 2024-01-03 NOTE — Patient Instructions (Addendum)
 Hydroxychloroquine Tablets What is this medication? HYDROXYCHLOROQUINE (hye drox ee KLOR oh kwin) treats autoimmune conditions, such as rheumatoid arthritis and lupus. It works by slowing down an overactive immune system. It may also be used to prevent and treat malaria. It works by killing the parasite that causes malaria. It belongs to a group of medications called DMARDs. This medicine may be used for other purposes; ask your health care provider or pharmacist if you have questions. COMMON BRAND NAME(S): Plaquenil, Quineprox, SOVUNA What should I tell my care team before I take this medication? They need to know if you have any of these conditions: Diabetes Eye disease, vision problems Frequently drink alcohol G6PD deficiency Heart disease Irregular heartbeat or rhythm Kidney disease Liver disease Porphyria Psoriasis An unusual or allergic reaction to hydroxychloroquine, other medications, foods, dyes, or preservatives Pregnant or trying to get pregnant Breastfeeding How should I use this medication? Take this medication by mouth with water. Take it as directed on the prescription label. Do not cut, crush, or chew this medication. Swallow the tablets whole. Take it with food. Do not take it more than directed. Take all of this medication unless your care team tells you to stop it early. Keep taking it even if you think you are better. Take products with antacids in them at a different time of day than this medication. Take this medication 4 hours before or 4 hours after antacids. Talk to your care team if you have questions. Talk to your care team about the use of this medication in children. While this medication may be prescribed for selected conditions, precautions do apply. Overdosage: If you think you have taken too much of this medicine contact a poison control center or emergency room at once. NOTE: This medicine is only for you. Do not share this medicine with others. What if I  miss a dose? If you miss a dose, take it as soon as you can. If it is almost time for your next dose, take only that dose. Do not take double or extra doses. What may interact with this medication? Do not take this medication with any of the following: Cisapride Dronedarone Pimozide Thioridazine This medication may also interact with the following: Ampicillin Antacids Cimetidine Cyclosporine Digoxin Kaolin Medications for diabetes, such as insulin , glipizide, glyburide Medications for seizures, such as carbamazepine, phenobarbital, phenytoin Mefloquine Methotrexate Other medications that cause heart rhythm changes Praziquantel This list may not describe all possible interactions. Give your health care provider a list of all the medicines, herbs, non-prescription drugs, or dietary supplements you use. Also tell them if you smoke, drink alcohol, or use illegal drugs. Some items may interact with your medicine. What should I watch for while using this medication? Visit your care team for regular checks on your progress. Tell your care team if your symptoms do not start to get better or if they get worse. You may need blood work done while you are taking this medication. If you take other medications that can affect heart rhythm, you may need more testing. Talk to your care team if you have questions. Your vision may be tested before and during use of this medication. Tell your care team right away if you have any change in your eyesight. This medication may cause serious skin reactions. They can happen weeks to months after starting the medication. Contact your care team right away if you notice fevers or flu-like symptoms with a rash. The rash may be red or purple and then  turn into blisters or peeling of the skin. Or, you might notice a red rash with swelling of the face, lips or lymph nodes in your neck or under your arms. If you or your family notice any changes in your behavior, such as  new or worsening depression, thoughts of harming yourself, anxiety, or other unusual or disturbing thoughts, or memory loss, call your care team right away. What side effects may I notice from receiving this medication? Side effects that you should report to your care team as soon as possible: Allergic reactions--skin rash, itching, hives, swelling of the face, lips, tongue, or throat Aplastic anemia--unusual weakness or fatigue, dizziness, headache, trouble breathing, increased bleeding or bruising Change in vision Heart rhythm changes--fast or irregular heartbeat, dizziness, feeling faint or lightheaded, chest pain, trouble breathing Infection--fever, chills, cough, or sore throat Low blood sugar (hypoglycemia)--tremors or shaking, anxiety, sweating, cold or clammy skin, confusion, dizziness, rapid heartbeat Muscle injury--unusual weakness or fatigue, muscle pain, dark yellow or brown urine, decrease in amount of urine Pain, tingling, or numbness in the hands or feet Rash, fever, and swollen lymph nodes Redness, blistering, peeling, or loosening of the skin, including inside the mouth Thoughts of suicide or self-harm, worsening mood, or feelings of depression Unusual bruising or bleeding Side effects that usually do not require medical attention (report to your care team if they continue or are bothersome): Diarrhea Headache Nausea Stomach pain Vomiting This list may not describe all possible side effects. Call your doctor for medical advice about side effects. You may report side effects to FDA at 1-800-FDA-1088. Where should I keep my medication? Keep out of the reach of children and pets. Store at room temperature up to 30 degrees C (86 degrees F). Protect from light. Get rid of any unused medication after the expiration date. To get rid of medications that are no longer needed or have expired: Take the medication to a medication take-back program. Check with your pharmacy or law  enforcement to find a location. If you cannot return the medication, check the label or package insert to see if the medication should be thrown out in the garbage or flushed down the toilet. If you are not sure, ask your care team. If it is safe to put it in the trash, empty the medication out of the container. Mix the medication with cat litter, dirt, coffee grounds, or other unwanted substance. Seal the mixture in a bag or container. Put it in the trash. NOTE: This sheet is a summary. It may not cover all possible information. If you have questions about this medicine, talk to your doctor, pharmacist, or health care provider.  2024 Elsevier/Gold Standard (2022-02-26 00:00:00)  Please get an annual eye examination to screen for eye toxicity while you are on hydroxychloroquine.  Standing Labs We placed an order today for your standing lab work.   Please have your standing labs drawn in 1 month after starting Plaquenil and then every 3 months  Please have your labs drawn 2 weeks prior to your appointment so that the provider can discuss your lab results at your appointment, if possible.  Please note that you may see your imaging and lab results in MyChart before we have reviewed them. We will contact you once all results are reviewed. Please allow our office up to 72 hours to thoroughly review all of the results before contacting the office for clarification of your results.  WALK-IN LAB HOURS  Monday through Thursday from 8:00 am -12:30 pm  and 1:00 pm-5:00 pm and Friday from 8:00 am-12:00 pm.  Patients with office visits requiring labs will be seen before walk-in labs.  You may encounter longer than normal wait times. Please allow additional time. Wait times may be shorter on  Monday and Thursday afternoons.  We do not book appointments for walk-in labs. We appreciate your patience and understanding with our staff.   Labs are drawn by [facility]. Please bring your co-pay at the time of your lab  draw.  You may receive a bill from [facility] for your lab work.  Please note if you are on Hydroxychloroquine and and an order has been placed for a Hydroxychloroquine level,  you will need to have it drawn 4 hours or more after your last dose.  If you wish to have your labs drawn at another location, please call the office 24 hours in advance so we can fax the orders.  The office is located at [address], [place], [place] [address]   If you have any questions regarding directions or hours of operation,  please call [phone].   As a reminder, please drink plenty of water prior to coming for your lab work. Thanks!   Vaccines You are taking a medication(s) that can suppress your immune system.  The following immunizations are recommended: Flu annually Covid-19  Td/Tdap (tetanus, diphtheria, pertussis) every 10 years Pneumonia (Prevnar 15 then Pneumovax 23 at least 1 year apart.  Alternatively, can take Prevnar 20 without needing additional dose) Shingrix: 2 doses from 4 weeks to 6 months apart  Please check with your PCP to make sure you are up to date.  If you have signs or symptoms of an infection or start antibiotics: First, call your PCP for workup of your infection. Hold your medication through the infection, until you complete your antibiotics, and until symptoms resolve if you take the following: Injectable medication (Actemra , Benlysta, Cimzia , Cosentyx, Enbrel , Humira , Kevzara, Orencia, Remicade, Simponi, Stelara, Taltz, Tremfya) Methotrexate Leflunomide  (Arava ) Mycophenolate (Cellcept) Cloria Danger, Olumiant, or Rinvoq

## 2024-01-03 NOTE — Telephone Encounter (Signed)
 Patient called requesting a refill of oxycodone  stating she is unable to see pain clinic MD until 5/15. Request sent to Dr. Luna Salinas. Per MD, refill sent to pharmacy. Patient notified.

## 2024-01-03 NOTE — Progress Notes (Signed)
 Oxycodone  refilled 5 mg PO TID PRN, # 21, no refills  Judith Campillo C. Luna Salinas, MD [facility] [phone]

## 2024-01-06 ENCOUNTER — Ambulatory Visit: Admitting: Rheumatology

## 2024-01-08 DIAGNOSIS — Z79899 Other long term (current) drug therapy: Secondary | ICD-10-CM | POA: Diagnosis not present

## 2024-01-09 ENCOUNTER — Ambulatory Visit: Admitting: Podiatry

## 2024-01-09 NOTE — Telephone Encounter (Signed)
 Spoke with [employer] representative to check status of program assistance application. Per representative, patient had submitted a social security denial letter, not a denial of the low income subsidy program that they were looking for.   Spoke with patient and explained this situation. She was given the correct phone number to apply for LIS [phone]).   [employer] Phone: [phone] Fax: [phone]

## 2024-01-10 MED ORDER — KEVZARA 200 MG/1.14ML ~~LOC~~ SOAJ
200.0000 mg | SUBCUTANEOUS | 0 refills | Status: DC
  Filled 2024-01-13 (×2): qty 2.28, 28d supply, fill #0

## 2024-01-10 NOTE — Addendum Note (Signed)
 Addended by: Thais Fill on: 01/10/2024 02:20 PM   Modules accepted: Orders

## 2024-01-10 NOTE — Telephone Encounter (Signed)
 Patient scheduled for Kevzara new start on 01/21/2024  Rx sent to [facility] to be couriered to clinic

## 2024-01-10 NOTE — Telephone Encounter (Signed)
 Patient enrolled into RA grant through [employer] fundation: ID: [id number] Eligibility start date: 10/11/2023 Group ID: [id number] Eligibility end date: 01/07/2025 RxBin ID: [id number] Assistance amount: $2500 PCN: [id number]  Patient can be scheduled for Kevzara new start

## 2024-01-13 ENCOUNTER — Other Ambulatory Visit (HOSPITAL_COMMUNITY): Payer: Self-pay

## 2024-01-13 ENCOUNTER — Other Ambulatory Visit: Payer: Self-pay

## 2024-01-13 NOTE — Progress Notes (Signed)
 Specialty Pharmacy Initial Fill Coordination Note  Christina Jenkins is a 63 y.o. female contacted today regarding initial fill of specialty medication(s) Sarilumab Dani Dupont)   Patient requested Courier to Provider Office   Delivery date: 01/15/24   Verified address: [address] [place], [place] [address]   Medication will be filled on 01/14/24.   Patient has grant on file and is aware of $0 copayment.

## 2024-01-14 ENCOUNTER — Other Ambulatory Visit: Payer: Self-pay

## 2024-01-21 ENCOUNTER — Ambulatory Visit: Attending: Rheumatology | Admitting: Pharmacist

## 2024-01-21 ENCOUNTER — Other Ambulatory Visit: Payer: Self-pay

## 2024-01-21 DIAGNOSIS — M0579 Rheumatoid arthritis with rheumatoid factor of multiple sites without organ or systems involvement: Secondary | ICD-10-CM

## 2024-01-21 DIAGNOSIS — Z79899 Other long term (current) drug therapy: Secondary | ICD-10-CM | POA: Diagnosis not present

## 2024-01-21 MED ORDER — KEVZARA 200 MG/1.14ML ~~LOC~~ SOAJ
200.0000 mg | SUBCUTANEOUS | 1 refills | Status: DC
  Filled 2024-01-21 – 2024-02-03 (×3): qty 2.28, 28d supply, fill #0
  Filled 2024-03-05 (×2): qty 2.28, 28d supply, fill #1

## 2024-01-21 MED ORDER — PREDNISONE 5 MG PO TABS: ORAL_TABLET | ORAL | 0 refills | Status: AC

## 2024-01-21 MED ORDER — KEVZARA 200 MG/1.14ML ~~LOC~~ SOAJ: 200.0000 mg | SUBCUTANEOUS | 3 refills | Status: DC

## 2024-01-21 NOTE — Addendum Note (Signed)
 Addended by: Thais Fill on: 01/21/2024 11:45 AM   Modules accepted: Orders

## 2024-01-21 NOTE — Progress Notes (Signed)
 Patient started Kevzara  in office today. Tolerated well but overall having pain and discomfort  On leflunomide  20mg  daily and prednisone  5mg  daily Continue Kevzara  200mg  subcut every 14 days  Currently with poor symptom control due to being off of biologic for some time now  Geraldene Kleine, PharmD, MPH, BCPS, CPP Clinical Pharmacist (Rheumatology and Pulmonology)

## 2024-01-21 NOTE — Progress Notes (Signed)
 Pharmacy Note  Subjective:   Patient presents to clinic today to receive first dose of Kevzara  for rheumatoid arthritis. Patient currently takes Leflunomide  and prednisone  5 mg.   Today patient states she has been off Humira  since January. Since then her RA symptoms have remained poorly controlled. She feels the symptoms the most in her hands and shoulders. She believes she may need prednisone  taper before the Kevzara  is able to take effect.   Previously tried: -Hydroxychlorquine (headache) -Humira   Patient running a fever or have signs/symptoms of infection? No  Patient currently on antibiotics for the treatment of infection? No  Patient have any upcoming invasive procedures/surgeries? No  Objective: CMP     Component Value Date/Time   NA 140 10/30/2023 1308   K 3.7 10/30/2023 1308   CL 102 10/30/2023 1308   CO2 23 10/30/2023 1308   GLUCOSE 174 (H) 10/30/2023 1308   BUN 13 10/30/2023 1308   CREATININE 1.02 (H) 10/30/2023 1308   CREATININE 0.82 12/09/2019 1323   CALCIUM  9.6 10/30/2023 1308   PROT 7.1 10/30/2023 1308   ALBUMIN 3.9 10/30/2023 1308   AST 18 10/30/2023 1308   ALT 19 10/30/2023 1308   ALKPHOS 67 10/30/2023 1308   BILITOT 0.5 10/30/2023 1308   GFRNONAA >60 10/30/2023 1308   GFRNONAA 78 12/09/2019 1323   GFRAA 91 12/09/2019 1323    CBC    Component Value Date/Time   WBC 11.2 (H) 10/30/2023 1308   RBC 4.11 10/30/2023 1308   HGB 12.5 10/30/2023 1308   HCT 38.3 10/30/2023 1308   PLT 249 10/30/2023 1308   MCV 93.2 10/30/2023 1308   MCH 30.4 10/30/2023 1308   MCHC 32.6 10/30/2023 1308   RDW 12.7 10/30/2023 1308   LYMPHSABS 0.7 10/30/2023 1308   MONOABS 0.1 10/30/2023 1308   EOSABS 0.0 10/30/2023 1308   BASOSABS 0.0 10/30/2023 1308    Baseline Immunosuppressant Therapy Labs TB GOLD    Latest Ref Rng & Units 12/09/2019    1:23 PM  Quantiferon TB Gold  Quantiferon TB Gold Plus NEGATIVE NEGATIVE    Hepatitis Panel   HIV No results found for:  "HIV" Immunoglobulins   SPEP    Latest Ref Rng & Units 10/30/2023    1:08 PM  Serum Protein Electrophoresis  Total Protein 6.5 - 8.1 g/dL 7.1    Z6XW No results found for: "G6PDH" TPMT No results found for: "TPMT"   Chest x-ray (07/02/2023):  Post-surgical changes to the left lung from prior partial pneumonectomy with left-sided volume loss and leftward mediastinal shift. A previously demonstrated left pleural effusion is no longer appreciated.   Assessment/Plan:  Reviewed importance of holding Kevzara  with signs/symptoms of an infections, if antibiotics are prescribed to treat an active infection, and with invasive procedures  Demonstrated proper injection technique with Kevzara  demo device  Patient able to demonstrate proper injection technique using the teach back method.  Patient self injected in the right upper thigh with:  Prescription Medication: Kevzara  200 mg/1.14 mL NDC: 9604-5409-81 Lot: [id number] Expiration: 12/01/2025  Patient tolerated well.  Observed for 30 mins in office for adverse reaction. Patient denies itchiness and irritation at injection., No swelling or redness noted., and Reviewed injection site reaction management with patient verbally and printed information for review in AVS  Patient is to return in 1 month for labs and 6-8 weeks for follow-up appointment.  Standing orders for CBC/CMP and lipid panels. TB gold will be monitored yearly.   Kevzara  approved through insurance and grant .  Rx sent to: [facility]: [phone] .  Patient provided with pharmacy phone number and advised to call later this week to schedule shipment to home.  Patient will continue Kevzara  200 mgsubcut every 14 days in combination with Leflunomide . She was given a prednisone  taper of 20 mg x 4 days, 15 mg x 4 days, 10 mg x 4 days, and 5 mg x 4 days. She will then resume 5 mg daily dosing  All questions encouraged and answered. Instructed patient to call with any  further questions or concerns.  Tolu Adrean Heitz, PharmD [facility] PGY-1

## 2024-01-21 NOTE — Patient Instructions (Addendum)
 Your next KEVZARA  dose is due on 02/04/2024, 02/18/2024, and every 14 days thereafter  CONTINUE Hydroxychloroquine  and INCREASE prednisone  for 20 mg for 4 days, then 15 mg for 4 days, 10 mg for 4 days, and 5 mg for days then resume 5 mg dosing. Prescription sent to pharmacy.   HOLD KEVZARA  if you have signs or symptoms of an infection. You can resume once you feel better or back to your baseline. HOLD KEVZARA  if you start antibiotics to treat an infection. HOLD KEVZARA  around the time of surgery/procedures. Your surgeon will be able to provide recommendations on when to hold BEFORE and when you are cleared to RESUME.  Pharmacy information: Your prescription will be shipped from [facility]. Their phone number is [phone] Please call to schedule shipment and confirm address. They will mail your medication to your home.  Cost information: Your copay should be affordable. If you call the pharmacy and it is not affordable, please double-check that they are billing through your grant as secondary coverage. That grant information is: ID: [id number] Group ID: [id number] RxBin ID: [id number] PCN: [id number]  Labs are due in 1 month then every 3 months. Lab hours are from Monday to Thursday 8am-12:30pm and 1pm-4pm and Friday 8am-12pm. You do not need an appointment if you come for labs during these times. If you'd like to go to a [facility] or [facility] closer to home, please call our clinic 48 hours prior to lab date so we can release orders in a timely manner.  Stay up to date on all routine vaccines: influenza, pneumonia, COVID19, Shingles  How to manage an injection site reaction: Remember the 5 C's: COUNTER - leave on the counter at least 30 minutes but up to overnight to bring medication to room temperature. This may help prevent stinging COLD - place something cold (like an ice gel pack or cold water bottle) on the injection site just before cleansing with alcohol. This may help reduce  pain CLARITIN - use Claritin (generic name is loratadine) for the first two weeks of treatment or the day of, the day before, and the day after injecting. This will help to minimize injection site reactions CORTISONE CREAM - apply if injection site is irritated and itching CALL ME - if injection site reaction is bigger than the size of your fist, looks infected, blisters, or if you develop hives

## 2024-01-28 ENCOUNTER — Ambulatory Visit (INDEPENDENT_AMBULATORY_CARE_PROVIDER_SITE_OTHER): Admitting: Podiatry

## 2024-01-28 ENCOUNTER — Encounter: Payer: Self-pay | Admitting: Podiatry

## 2024-01-28 ENCOUNTER — Ambulatory Visit (INDEPENDENT_AMBULATORY_CARE_PROVIDER_SITE_OTHER)

## 2024-01-28 DIAGNOSIS — M21611 Bunion of right foot: Secondary | ICD-10-CM

## 2024-01-28 DIAGNOSIS — M2012 Hallux valgus (acquired), left foot: Secondary | ICD-10-CM | POA: Diagnosis not present

## 2024-01-28 DIAGNOSIS — M2011 Hallux valgus (acquired), right foot: Secondary | ICD-10-CM | POA: Diagnosis not present

## 2024-01-28 DIAGNOSIS — M21621 Bunionette of right foot: Secondary | ICD-10-CM | POA: Diagnosis not present

## 2024-01-28 DIAGNOSIS — M06272 Rheumatoid bursitis, left ankle and foot: Secondary | ICD-10-CM

## 2024-01-28 DIAGNOSIS — M21622 Bunionette of left foot: Secondary | ICD-10-CM | POA: Diagnosis not present

## 2024-01-28 DIAGNOSIS — M21612 Bunion of left foot: Secondary | ICD-10-CM

## 2024-01-28 NOTE — Patient Instructions (Signed)
  VISIT SUMMARY: Today, we discussed your worsening foot pain and nodules related to your rheumatoid arthritis. We also reviewed your bunions and smoking habits. We developed a plan to address these issues and improve your overall health.  YOUR PLAN: -RHEUMATOID ARTHRITIS WITH NODULES: Rheumatoid arthritis is an autoimmune disease that causes joint inflammation and can lead to nodules. You have painful nodules on both feet, especially the left one. We will perform an aspiration and steroid injection on the left foot to reduce pain and inflammation. This procedure involves using a needle to remove fluid from the nodule and then injecting steroids to reduce inflammation. If this does not help, we may consider surgical removal in the future.  -BILATERAL HALLUX VALGUS (BUNION): Bunions are bony bumps that form on the joint at the base of your big toes, often causing pain and limited movement. They can be worsened by rheumatoid arthritis. We recommend wearing wide shoes with soft material to reduce discomfort. If the pain becomes too severe, we can discuss surgical options.  -TOBACCO USE: Smoking can increase the risk of complications from surgery and affect your overall health. We strongly encourage you to quit smoking to reduce these risks and improve your health.  INSTRUCTIONS: We will perform the aspiration and steroid injection on your left foot today. Please monitor the site for any signs of infection, such as increased redness, swelling, or pain, and contact us  if you notice any of these symptoms. Continue wearing wide, soft shoes to accommodate your bunions. If your bunion pain becomes intolerable, we can discuss surgical options. We also recommend that you make a plan to quit smoking to improve your overall health and reduce surgical risks. Follow up with us  if you have any concerns or if your symptoms do not improve.                      Contains text generated by [employer]bridge.                                  Contains text generated by Abridge.

## 2024-01-29 ENCOUNTER — Other Ambulatory Visit: Payer: Self-pay

## 2024-01-31 ENCOUNTER — Other Ambulatory Visit: Payer: Self-pay

## 2024-02-01 ENCOUNTER — Encounter: Payer: Self-pay | Admitting: Podiatry

## 2024-02-01 NOTE — Progress Notes (Signed)
 Subjective:  Patient ID: Christina Jenkins, female    DOB: 1960-12-02,  MRN: [id number]  Chief Complaint  Patient presents with   Foot Pain    "I have Rheumatoid Arthritis.  I may have nodules on both sides." (5th met bilateral)    Discussed the use of AI scribe software for clinical note transcription with the patient, who gave verbal consent to proceed.  History of Present Illness Christina HELBLING "Novie" is a 63 year old female with rheumatoid arthritis who presents with worsening foot pain and nodules. She was referred by her neighbor, Concetta Dee.  She experiences worsening pain and nodules on both feet, attributed to her rheumatoid arthritis. The nodules have been present for some time but have recently worsened, especially on the left foot, where the pain is more pronounced than on the right.  She is currently taking Arava  for her rheumatoid arthritis and recently switched from Humira , having only received one dose of the new medication, which she refers to as 'T E M something'.  In August, she underwent a lung biopsy due to a suspected nodule, which required her to temporarily discontinue her rheumatoid arthritis medication. She describes difficulty managing her condition when off medication, noting it takes about one and a half to two months to regain control after resuming treatment.  She smokes about half a pack of cigarettes a day and has attempted to quit in the past.      Objective:    Physical Exam VASCULAR: DP and PT pulse palpable. Foot is warm and well-perfused. Capillary fill time is brisk. Varicosities present. DERMATOLOGIC: Normal skin turgor, texture, and temperature. No open lesions, rashes, or ulcerations. NEUROLOGIC: Normal sensation to light touch and pressure. No paresthesias. ORTHOPEDIC: Large bilateral hallux valgus deformity with pain on the medial eminence and limited range of motion. Tailor's bunion deformity. Small rheumatoid nodule on the right  lateral fifth metatarsal head and a large fluctuant inflamed nodule on the left. No ecchymosis or bruising. No gross deformity. No pain to palpation.   No images are attached to the encounter.    Results Procedure: Aspiration and Steroid Injection of Rheumatoid Nodule Description: Local field block performed with 1.5 cc of 2% lidocaine  and 0.5% lidocaine  plain. Left foot rheumatoid nodule aspirated with minimal fluid. 20 mg of Kenalog  and 4 mg of dexamethasone  injected into the area. Informed Consent: Discussed risks, benefits, and alternatives including surgical excision, reconstructive surgery, and aspiration with steroid injection. Risks include infection and healing issues, especially due to rheumatoid arthritis medications and smoking. Patient consented to aspiration and steroid injection.  RADIOLOGY Foot radiographs: Significant hallux valgus deformity. Degenerative changes of the bilateral first metatarsal phalangeal joint. No calcification of the nodules noted on plain film x-ray. (01/28/2024)  PATHOLOGY Lung biopsy: Nodule in lung, not specified as malignant or benign. (04/2023)   Assessment:   1. Rheumatoid bursitis, left ankle and foot (HCC)   2. Tailor's bunion of both feet   3. Hallux valgus with bunions, right   4. Hallux valgus with bunions, left      Plan:  Patient was evaluated and treated and all questions answered.  Assessment and Plan Assessment & Plan Rheumatoid arthritis with nodules Rheumatoid nodules present on both feet, with a large, fluctuant, and inflamed nodule on the left foot causing pain, inflammation, and difficulty with footwear. Discussed treatment options including aspiration and steroid injection, a simple office procedure with minimal risk, and surgical excision, which carries higher risks of infection and healing issues,  especially given current medications and smoking status. She prefers aspiration and steroid injection to alleviate symptoms, as  it does not require stopping current rheumatoid arthritis medications. - Perform aspiration and steroid injection of the left foot rheumatoid nodule with 20 mg of Kenalog  and 4 mg of dexamethasone . Minimal fluid was able to be removed - Educate on potential risks and benefits of surgical excision if injection is not successful.  Bilateral hallux valgus (bunion) Bilateral hallux valgus deformity with pain on the medial eminence and limited range of motion. Bunions are exacerbated by rheumatoid arthritis and can be hereditary. Surgical correction is the definitive treatment but involves significant recovery and potential complications. She is advised to tolerate bunions with appropriate footwear unless symptoms worsen. - Advise wearing wide shoes with soft material to accommodate bunions. - Discuss surgical options if bunion symptoms become intolerable.  Tobacco use Smokes about half a pack per day. Smoking increases risks of surgical complications, including infection and healing issues, especially in the context of rheumatoid arthritis treatment. - Encourage smoking cessation to reduce surgical risks and improve overall health.      No follow-ups on file.

## 2024-02-03 ENCOUNTER — Other Ambulatory Visit: Payer: Self-pay

## 2024-02-03 NOTE — Progress Notes (Signed)
 Specialty Pharmacy Refill Coordination Note  Christina Jenkins is a 63 y.o. female contacted today regarding refills of specialty medication(s) Sarilumab  (Kevzara )   Patient requested Delivery   Delivery date: 02/13/24   Verified address: [address] [place], [place] [address]   Medication will be filled on 06.11.25.

## 2024-02-04 ENCOUNTER — Ambulatory Visit: Admitting: Urology

## 2024-02-04 ENCOUNTER — Telehealth: Payer: Self-pay | Admitting: Podiatry

## 2024-02-04 MED ORDER — DICLOFENAC SODIUM 75 MG PO TBEC: 75.0000 mg | DELAYED_RELEASE_TABLET | Freq: Two times a day (BID) | ORAL | 0 refills | Status: AC

## 2024-02-04 NOTE — Telephone Encounter (Signed)
 Patient wanting to know if something could be prescribed for her pain. States that Rheumatoid nodule L is causing a great deal of pain and tylenol  is not doing much to alleviate it. If so send to pharmacy on file.

## 2024-02-06 ENCOUNTER — Other Ambulatory Visit: Payer: Self-pay

## 2024-02-07 NOTE — Telephone Encounter (Signed)
 Patient called stating that the medicine that was prescribed has not been helping alleviate the pain.

## 2024-02-11 ENCOUNTER — Other Ambulatory Visit: Payer: Self-pay | Admitting: *Deleted

## 2024-02-11 MED ORDER — PREDNISONE 5 MG PO TABS: ORAL_TABLET | ORAL | 0 refills | Status: AC

## 2024-02-11 NOTE — Telephone Encounter (Signed)
 Patient called the office again about her prednisone , advised we would give her a call once we get a response.

## 2024-02-11 NOTE — Telephone Encounter (Signed)
 Patient contacted the office stating she is having a flare in her hands and her shoulders. Patient states she has been flaring for 3 days. Patient states she is having swelling in her hands. Patient states she is due for her next Kevzara  next Tuesday. Patient states she is taking her Arava  and Prednisone  5 mg daily. Patient states she had to discontinue the PLQ due to headaches. Patient is requesting a prednisone  taper. Please advise.

## 2024-02-11 NOTE — Addendum Note (Signed)
 Addended by: Adrianne Horn on: 02/11/2024 04:58 PM   Modules accepted: Orders

## 2024-02-11 NOTE — Telephone Encounter (Signed)
 This send a prescription for prednisone  taper starting at 20 mg and taper by 5 mg every 4 days.  Please notify patient that prednisone  can cause weight gain, hypertension, diabetes, osteoporosis, cataract and heart disease.

## 2024-02-12 ENCOUNTER — Other Ambulatory Visit (HOSPITAL_COMMUNITY): Payer: Self-pay

## 2024-02-14 ENCOUNTER — Telehealth: Payer: Self-pay

## 2024-02-14 NOTE — Telephone Encounter (Signed)
 Patient contacted the office requesting a refill of Oxycodone . She states that she did not see the pain clinic doctor in May as her appointment was rescheduled to the end of this month. Advised that patient has been cleared from a surgical standpoint and she would need to get pain medication from the pain clinic.

## 2024-02-18 ENCOUNTER — Telehealth: Payer: Self-pay | Admitting: *Deleted

## 2024-02-18 ENCOUNTER — Telehealth: Payer: Self-pay

## 2024-02-18 NOTE — Telephone Encounter (Signed)
 Patient contacted the office to advised she contacted the Kevzara  manufacturer and they will be sending out a replacement pen. Patient wants to know if she can use her pen she has left. Patient states she received very little of the medication. Consulted with Dr. Alvira Josephs and patient advised she could wait and take her injection next week. Patient expressed understanding.

## 2024-02-18 NOTE — Telephone Encounter (Signed)
 Patient didn't get any of the medication, provided her with the number to Kevzara  manufacture to call and get a replacement pen. Patient said she would call an get the replacement pen.   Phone number given was [phone]

## 2024-02-18 NOTE — Telephone Encounter (Signed)
 Patient called the office stating that she was trying to do her injection of Kevzara  and her hand slipped so she did not get the injection and now she can't reuse the pen to get the medication from the pen. Patient wants to know what she should do because if she uses another pen she will end up being short a dose. Please advise

## 2024-02-18 NOTE — Telephone Encounter (Signed)
 Please clarify if any of the medication was administered?  She can contact the company for a replacement if there was a mishap with the device.

## 2024-02-19 NOTE — Progress Notes (Unsigned)
 Office Visit Note  Patient: Christina Jenkins             Date of Birth: 01/06/1961           MRN: [id number]             PCP: Jolee Greig DEL, PA-C Referring: Jolee Greig DEL, PA-C Visit Date: 03/04/2024 Occupation: @GUAROCC @  Subjective:  Medication monitoring  History of Present Illness: Christina Jenkins is a 63 y.o. female with history of seropositive rheumatoid arthritis and osteoarthritis.  Patient is currently on kevzara  and arava  as combination therapy.  She remains on prednisone  5 mg daily.  Patient has had a total of 2 doses of Kevzara  and states that with her third injection she had a mishap with the device and did not receive any of the dose.  Patient states that she has contacted the manufacturer but has not received a new pen to administer.  She is due for her next dose of kevzara  on Tuesday.  Patient states that she tried taking Plaquenil  after her last office visit but had to discontinue due to daily persistent headaches. Patient continues to have intermittent pain and stiffness involving both shoulders and both hands.  She notices intermittent swelling in her wrist joints.     Activities of Daily Living:  Patient reports morning stiffness for 1-2 hours.   Patient Reports nocturnal pain.  Difficulty dressing/grooming: Denies Difficulty climbing stairs: Denies Difficulty getting out of chair: Denies Difficulty using hands for taps, buttons, cutlery, and/or writing: Reports  Review of Systems  Constitutional:  Negative for fatigue.  HENT:  Positive for mouth dryness. Negative for mouth sores.   Eyes:  Negative for dryness.  Respiratory:  Negative for shortness of breath.   Cardiovascular:  Negative for chest pain and palpitations.  Gastrointestinal:  Negative for blood in stool, constipation and diarrhea.  Endocrine: Negative for increased urination.  Genitourinary:  Negative for involuntary urination.  Musculoskeletal:  Positive for joint pain, joint pain, joint swelling and  morning stiffness. Negative for gait problem, myalgias, muscle weakness, muscle tenderness and myalgias.  Skin:  Negative for color change, rash, hair loss and sensitivity to sunlight.  Allergic/Immunologic: Negative for susceptible to infections.  Neurological:  Negative for dizziness and headaches.  Hematological:  Negative for swollen glands.  Psychiatric/Behavioral:  Negative for depressed mood and sleep disturbance. The patient is not nervous/anxious.     PMFS History:  Patient Active Problem List   Diagnosis Date Noted   Recurrent UTI 10/29/2023   At risk for infection due to immunosuppression 10/29/2023   Primary adenocarcinoma of upper lobe of left lung (HCC) 06/02/2023   S/P partial lobectomy of lung 05/03/2023   Solitary pulmonary nodule on lung CT 03/02/2023   COPD GOLD ? / group A 03/02/2023   Lung nodule 03/02/2023   Chest pain of uncertain etiology 04/30/2019   Abnormal nuclear stress test    Circulatory system disorder 04/18/2019   Ischemia 04/18/2019   Hyperglycemia, unspecified 12/30/2018   Other abnormal glucose 12/30/2018   History of COPD 05/31/2017   Mixed hyperlipidemia 04/30/2017   Fatigue 12/04/2016   History of seizures 10/23/2016   History of hepatitis C 10/23/2016   ANA positive 10/23/2016   Sicca syndrome (HCC) 10/23/2016   Venous (peripheral) insufficiency 10/08/2016   Varicose veins of lower extremities with inflammation 10/08/2016   Seropositive rheumatoid arthritis (HCC) 08/01/2016   High risk medication use 08/01/2016   Hepatitis C 08/01/2016   Cigarette smoker 08/01/2016  Depression 08/01/2016   Complex partial seizure (HCC) 02/02/2016   MCI (mild cognitive impairment) 11/02/2015   Arthralgia 11/02/2015   Carpal tunnel syndrome 10/20/2015   Syncope 05/17/2015   Convulsions/seizures (HCC) 05/17/2015   Tobacco use disorder 02/23/2014   Edema 09/22/2013   Osteoarthrosis 06/11/2011   Hypertension 12/20/2010   Hypokalemia 12/20/2010     Past Medical History:  Diagnosis Date   Adenocarcinoma of upper lobe of left lung (HCC)    Carpal tunnel syndrome    COPD (chronic obstructive pulmonary disease) (HCC)    Coronary artery calcification seen on CT scan    Normal coronaries at cardiac catheterization 2020   Depression    Essential hypertension    Hepatitis C    Hypokalemia    Mixed hyperlipidemia    Rheumatoid arthritis (HCC)    Rheumatoid arthritis (HCC)    Seasonal allergies    Type 2 diabetes mellitus (HCC)    Varicose veins of legs     Family History  Problem Relation Age of Onset   Diabetes Mother    Pancreatic cancer Mother    Diabetes Father    COPD Father    Dementia Father    Diabetes Brother    Past Surgical History:  Procedure Laterality Date   BACK SURGERY     BRONCHIAL BIOPSY  04/02/2023   Procedure: BRONCHIAL BIOPSIES;  Surgeon: Brenna Adine CROME, DO;  Location: [facility];  Service: Pulmonary;;   BRONCHIAL BRUSHINGS  04/02/2023   Procedure: BRONCHIAL BRUSHINGS;  Surgeon: Brenna Adine CROME, DO;  Location: [facility];  Service: Pulmonary;;   BRONCHIAL NEEDLE ASPIRATION BIOPSY  04/02/2023   Procedure: BRONCHIAL NEEDLE ASPIRATION BIOPSIES;  Surgeon: Brenna Adine CROME, DO;  Location: [facility];  Service: Pulmonary;;   FIDUCIAL MARKER PLACEMENT  04/02/2023   Procedure: FIDUCIAL MARKER PLACEMENT;  Surgeon: Brenna Adine CROME, DO;  Location: [facility];  Service: Pulmonary;;   INTERCOSTAL NERVE BLOCK Left 05/03/2023   Procedure: INTERCOSTAL NERVE BLOCK;  Surgeon: Kerrin Elspeth BROCKS, MD;  Location: [facility];  Service: Thoracic;  Laterality: Left;   LEFT HEART CATH AND CORONARY ANGIOGRAPHY N/A 04/30/2019   Procedure: LEFT HEART CATH AND CORONARY ANGIOGRAPHY;  Surgeon: Court Dorn PARAS, MD;  Location: [facility];  Service: Cardiovascular;  Laterality: N/A;   NODE DISSECTION Left 05/03/2023   Procedure: NODE DISSECTION;  Surgeon: Kerrin Elspeth BROCKS, MD;  Location: [facility];  Service: Thoracic;   Laterality: Left;   TONSILLECTOMY     TUBAL LIGATION     Social History   Social History Narrative   1 biological child, 1 stepchild   Immunization History  Administered Date(s) Administered   Influenza,inj,Quad PF,6+ Mos 05/16/2017, 05/15/2018, 05/09/2019   Influenza-Unspecified 05/30/2023   Moderna Sars-Covid-2 Vaccination 11/16/2019, 12/14/2019, 08/11/2020, 05/30/2023   Tdap 06/26/2018   Zoster Recombinant(Shingrix) 09/04/2017     Objective: Vital Signs: BP 96/63 (BP Location: Left Arm, Patient Position: Sitting, Cuff Size: Normal)   Pulse 98   Resp 16   Ht 5' 6 (1.676 m)   Wt 185 lb 9.6 oz (84.2 kg)   BMI 29.96 kg/m    Physical Exam Vitals and nursing note reviewed.  Constitutional:      Appearance: She is well-developed.  HENT:     Head: Normocephalic and atraumatic.  Eyes:     Conjunctiva/sclera: Conjunctivae normal.  Cardiovascular:     Rate and Rhythm: Normal rate and regular rhythm.     Heart sounds: Normal heart sounds.  Pulmonary:  Effort: Pulmonary effort is normal.     Breath sounds: Normal breath sounds.  Abdominal:     General: Bowel sounds are normal.     Palpations: Abdomen is soft.  Musculoskeletal:     Cervical back: Normal range of motion.  Lymphadenopathy:     Cervical: No cervical adenopathy.  Skin:    General: Skin is warm and dry.     Capillary Refill: Capillary refill takes less than 2 seconds.  Neurological:     Mental Status: She is alert and oriented to person, place, and time.  Psychiatric:        Behavior: Behavior normal.      Musculoskeletal Exam: Painful range of motion of the cervical spine especially with lateral rotation.  Painful range of motion of both shoulder joints.  Elbow joints have good range of motion with no tenderness along the joint line.  Wrist joints have good range of motion with some tenderness on the ulnar aspect of the right wrist.  Mild inflammation was noted in the right 1st and 2nd MCP joints.  Hip  joints have good range of motion with no groin pain.  Knee joints have good range of motion no warmth or effusion.  Ankle joints have good range of motion with no tenderness or joint swelling.  CDAI Exam: CDAI Score: -- Patient Global: --; Provider Global: -- Swollen: --; Tender: -- Joint Exam 03/04/2024   No joint exam has been documented for this visit   There is currently no information documented on the homunculus. Go to the Rheumatology activity and complete the homunculus joint exam.  Investigation: No additional findings.  Imaging: No results found.   Recent Labs: Lab Results  Component Value Date   WBC 11.2 (H) 10/30/2023   HGB 12.5 10/30/2023   PLT 249 10/30/2023   NA 140 10/30/2023   K 3.7 10/30/2023   CL 102 10/30/2023   CO2 23 10/30/2023   GLUCOSE 174 (H) 10/30/2023   BUN 13 10/30/2023   CREATININE 1.02 (H) 10/30/2023   BILITOT 0.5 10/30/2023   ALKPHOS 67 10/30/2023   AST 18 10/30/2023   ALT 19 10/30/2023   PROT 7.1 10/30/2023   ALBUMIN 3.9 10/30/2023   CALCIUM  9.6 10/30/2023   GFRAA 91 12/09/2019   QFTBGOLDPLUS NEGATIVE 12/09/2019    Speciality Comments: Need Hep C quant every 6 months Prior therapy: methotrexate (oral ulcers) and Cimzia  (inadequate response) , Enbrel  stopped Jan 2023 Humira  started 09/27/21  TB Gold: Negative 01/21/2023  Procedures:  No procedures performed Allergies: Patient has no known allergies.    Assessment / Plan:     Visit Diagnoses: Rheumatoid arthritis with rheumatoid factor of multiple sites without organ or systems involvement (HCC) - She continues to have ongoing pain and stiffness involving the shoulders, both elbows, and both wrist joints.  She is taking prednisone  5 mg 1 tablet by mouth daily currently.  She remains on Areva 20 mg daily.  After her last office visit she initiated Plaquenil  but had to discontinue due to daily persistent headaches.  She has had a total of 2 doses of Kevzara  so far.  With her third dose  of Kevzara  she had a mishap with the device and did not receive any of the dose.  A sample of Kevzara  was provided to the patient today--due Tuesday for her next dose.   Patient requested a referral to pain management which was placed today.   Patient is willing to give Kevzara  and Arava  more time and  we will reassess for the full efficacy in 2 to 3 months.  Plan: Lipid panel, predniSONE  (DELTASONE ) 5 MG tablet, Ambulatory referral to Pain Clinic  High risk medication use - Kevzara  200 mg sq injections every 14 days, Arava  20 mg 1 tablet by mouth daily and Prednisone  5 mg daily. CBC and CMET updated on 02/26/2024--results will be scanned in to epic. Plaquenil  discontinued-daily headaches CXR 07/02/23. Lipid panel and Hgb A1c updated--plan to call PCP for records  No recent infections.  Discussed the importance of holding kevzara  and arava  if she develops signs or symptoms of an infection and to resume once the infection has completely cleared.  - Plan: Lipid panel  Lipid screening -Future order for lipid panel placed today.  Plan to call PCP to obtain most recent cholesterol panel.  Plan: Lipid panel  Rheumatoid nodule [facility]): Lateral aspect of the left foot.  Long term (current) use of systemic steroids - She remains on prednisone  5 mg daily.  Frequent prednisone  tapers.  She is aware of the risks of long-term and frequent prednisone  use. Discussed the importance of having an updated hemoglobin A1c--patient reports that her PCP recently obtained a hemoglobin A1c so we will call to obtain these records. Discussed the importance of having an updated bone density.  She plans on reaching out to her PCP to have a new order placed for a DEXA.  Discussed the increased risk for developing osteoporosis in patients on long-term systemic steroids.- Plan: HgB A1c, Ambulatory referral to Pain Clinic  Chronic pain of both shoulders -Patient continues to have persistent pain and stiffness involving both  shoulders.  On examination today she has discomfort with range of motion but overall her range of motion was well-preserved.  Plan to refer the patient to pain management as discussed.  Plan: Ambulatory referral to Pain Clinic  Primary osteoarthritis of both hands - She has ongoing pain and stiffness involving both hands and both wrist joints.  She has noticed intermittent swelling in both wrist especially the right wrist.  She has some tenderness along the ulnar aspect of the right wrist today.  Mild inflammation was noted in the right 1st and 2nd MCP joints.  She remains on prednisone  5 mg daily.  Patient requested a referral to pain management in [place] to discuss pain management options.  Plan: Ambulatory referral to Pain Clinic  Primary osteoarthritis of both feet - She has good range of motion of both ankle joints with no tenderness or joint swelling.  Plan: Ambulatory referral to Pain Clinic  Trochanteric bursitis of both hips: Intermittent discomfort.  Other medical conditions are listed as follows:  ANA positive: No clinical features of systemic lupus.  History of hepatitis C - HCV quant nonreactive on 11/03/21. Hepatitis C antibody negative on 02/12/22. HCV antibody negative on 10/10/22.Hep C quant not detected on 09/16/2023.  History of COPD  History of seizures  Former smoker   Orders: Orders Placed This Encounter  Procedures   HgB A1c   Lipid panel   Ambulatory referral to Pain Clinic   Meds ordered this encounter  Medications   predniSONE  (DELTASONE ) 5 MG tablet    Sig: Take 1 tablet (5 mg total) by mouth daily with breakfast.    Dispense:  30 tablet    Refill:  2    Follow-Up Instructions: Return in about 3 months (around 06/04/2024) for Rheumatoid arthritis.   Waddell CHRISTELLA Craze, PA-C  Note - This record has been created using Dragon software.  Chart  creation errors have been sought, but may not always  have been located. Such creation errors do not reflect on  the  standard of medical care.

## 2024-02-21 ENCOUNTER — Other Ambulatory Visit: Payer: Self-pay | Admitting: Rheumatology

## 2024-02-21 DIAGNOSIS — Z79899 Other long term (current) drug therapy: Secondary | ICD-10-CM

## 2024-02-21 NOTE — Telephone Encounter (Signed)
 Last Fill: 10/25/2023  Labs: 10/30/2023 WBC 11.2, Neutro Abs 10.3, Glucose 174, Creat. 1.02  Next Visit: 03/04/2024  Last Visit: 01/03/2024  DX: Rheumatoid arthritis with rheumatoid factor of multiple sites without organ or systems involvement   Current Dose per office note 01/03/2024: Arava  20 mg 1 tablet by mouth daily   Patient advised she is due to update labs. Patient will have updated at [facility]. Lab orders faxed.   Okay to refill Arava  ?

## 2024-02-26 DIAGNOSIS — Z79899 Other long term (current) drug therapy: Secondary | ICD-10-CM | POA: Diagnosis not present

## 2024-02-26 LAB — LAB REPORT - SCANNED: EGFR: 74.6

## 2024-03-03 DIAGNOSIS — E7849 Other hyperlipidemia: Secondary | ICD-10-CM | POA: Diagnosis not present

## 2024-03-03 DIAGNOSIS — R053 Chronic cough: Secondary | ICD-10-CM | POA: Diagnosis not present

## 2024-03-03 DIAGNOSIS — E1122 Type 2 diabetes mellitus with diabetic chronic kidney disease: Secondary | ICD-10-CM | POA: Diagnosis not present

## 2024-03-03 DIAGNOSIS — M069 Rheumatoid arthritis, unspecified: Secondary | ICD-10-CM | POA: Diagnosis not present

## 2024-03-03 DIAGNOSIS — E119 Type 2 diabetes mellitus without complications: Secondary | ICD-10-CM | POA: Diagnosis not present

## 2024-03-03 LAB — LAB REPORT - SCANNED: A1c: 5.8

## 2024-03-04 ENCOUNTER — Encounter: Payer: Self-pay | Admitting: Physician Assistant

## 2024-03-04 ENCOUNTER — Other Ambulatory Visit: Payer: Self-pay

## 2024-03-04 ENCOUNTER — Encounter (INDEPENDENT_AMBULATORY_CARE_PROVIDER_SITE_OTHER): Payer: Self-pay

## 2024-03-04 ENCOUNTER — Ambulatory Visit: Attending: Physician Assistant | Admitting: Physician Assistant

## 2024-03-04 VITALS — BP 96/63 | HR 98 | Resp 16 | Ht 66.0 in | Wt 185.6 lb

## 2024-03-04 DIAGNOSIS — M25512 Pain in left shoulder: Secondary | ICD-10-CM

## 2024-03-04 DIAGNOSIS — M19072 Primary osteoarthritis, left ankle and foot: Secondary | ICD-10-CM

## 2024-03-04 DIAGNOSIS — R768 Other specified abnormal immunological findings in serum: Secondary | ICD-10-CM | POA: Diagnosis not present

## 2024-03-04 DIAGNOSIS — Z8619 Personal history of other infectious and parasitic diseases: Secondary | ICD-10-CM | POA: Diagnosis not present

## 2024-03-04 DIAGNOSIS — M7061 Trochanteric bursitis, right hip: Secondary | ICD-10-CM

## 2024-03-04 DIAGNOSIS — M7062 Trochanteric bursitis, left hip: Secondary | ICD-10-CM

## 2024-03-04 DIAGNOSIS — M0630 Rheumatoid nodule, unspecified site: Secondary | ICD-10-CM | POA: Diagnosis not present

## 2024-03-04 DIAGNOSIS — Z8709 Personal history of other diseases of the respiratory system: Secondary | ICD-10-CM

## 2024-03-04 DIAGNOSIS — M0579 Rheumatoid arthritis with rheumatoid factor of multiple sites without organ or systems involvement: Secondary | ICD-10-CM | POA: Diagnosis not present

## 2024-03-04 DIAGNOSIS — G8929 Other chronic pain: Secondary | ICD-10-CM

## 2024-03-04 DIAGNOSIS — Z7952 Long term (current) use of systemic steroids: Secondary | ICD-10-CM

## 2024-03-04 DIAGNOSIS — M19041 Primary osteoarthritis, right hand: Secondary | ICD-10-CM | POA: Diagnosis not present

## 2024-03-04 DIAGNOSIS — Z87898 Personal history of other specified conditions: Secondary | ICD-10-CM

## 2024-03-04 DIAGNOSIS — Z13220 Encounter for screening for lipoid disorders: Secondary | ICD-10-CM

## 2024-03-04 DIAGNOSIS — Z87891 Personal history of nicotine dependence: Secondary | ICD-10-CM

## 2024-03-04 DIAGNOSIS — M25511 Pain in right shoulder: Secondary | ICD-10-CM

## 2024-03-04 DIAGNOSIS — M19071 Primary osteoarthritis, right ankle and foot: Secondary | ICD-10-CM

## 2024-03-04 DIAGNOSIS — Z79899 Other long term (current) drug therapy: Secondary | ICD-10-CM | POA: Diagnosis not present

## 2024-03-04 DIAGNOSIS — M19042 Primary osteoarthritis, left hand: Secondary | ICD-10-CM

## 2024-03-04 MED ORDER — PREDNISONE 5 MG PO TABS: 5.0000 mg | ORAL_TABLET | Freq: Every day | ORAL | 2 refills | Status: DC

## 2024-03-04 NOTE — Progress Notes (Signed)
 Medication Samples have been provided to the patient.  Drug name: Kevzara        Strength: 200 mg        Qty: 1  LOT: [id number]  Exp.Date: 12/01/2024  Dosing instructions: Inject 200 mg into skin every 14 days

## 2024-03-05 ENCOUNTER — Other Ambulatory Visit: Payer: Self-pay

## 2024-03-05 DIAGNOSIS — Z1231 Encounter for screening mammogram for malignant neoplasm of breast: Secondary | ICD-10-CM | POA: Diagnosis not present

## 2024-03-05 NOTE — Progress Notes (Signed)
 Specialty Pharmacy Refill Coordination Note  Christina Jenkins is a 63 y.o. female contacted today regarding refills of specialty medication(s) Sarilumab  (Kevzara )   Patient requested (Patient-Rptd) Delivery   Delivery date: 03/10/24   Verified address: (Patient-Rptd) [address] [place], [place]  [address]   Medication will be filled on 03/09/24.

## 2024-03-25 NOTE — Addendum Note (Signed)
 Addended by: DAYNE SHERRY RAMAN on: 03/25/2024 10:02 AM   Modules accepted: Level of Service

## 2024-03-26 ENCOUNTER — Other Ambulatory Visit: Payer: Self-pay

## 2024-03-26 ENCOUNTER — Other Ambulatory Visit: Payer: Self-pay | Admitting: Physician Assistant

## 2024-03-26 DIAGNOSIS — M0579 Rheumatoid arthritis with rheumatoid factor of multiple sites without organ or systems involvement: Secondary | ICD-10-CM

## 2024-03-26 MED ORDER — KEVZARA 200 MG/1.14ML ~~LOC~~ SOAJ
200.0000 mg | SUBCUTANEOUS | 2 refills | Status: DC
  Filled 2024-03-26 – 2024-04-06 (×3): qty 2.28, 28d supply, fill #0
  Filled 2024-05-05: qty 2.28, 28d supply, fill #1
  Filled 2024-06-05 – 2024-06-17 (×2): qty 2.28, 28d supply, fill #2

## 2024-03-26 NOTE — Telephone Encounter (Signed)
Due to update TB gold

## 2024-03-26 NOTE — Telephone Encounter (Signed)
 Last Fill: 01/21/2024  Labs: 02/26/2024 Glucose 113, Total protein 6.0, ALT 36.0, A/G Ratio 2.3  TB Gold: Negative 01/21/2023    Next Visit: 06/04/2024  Last Visit: 03/04/2024  IK:Myzlfjunpi arthritis with rheumatoid factor of multiple sites without organ or systems involvement   Current Dose per office note 03/04/2024: Kevzara  200 mg sq injections every 14 days   Okay to refill Kevzara ?

## 2024-03-30 ENCOUNTER — Other Ambulatory Visit: Payer: Self-pay

## 2024-04-06 ENCOUNTER — Other Ambulatory Visit: Payer: Self-pay

## 2024-04-06 NOTE — Progress Notes (Signed)
 Specialty Pharmacy Refill Coordination Note  Christina Jenkins is a 63 y.o. female contacted today regarding refills of specialty medication(s) Sarilumab  (Kevzara )   Patient requested Delivery   Delivery date: 04/14/24   Verified address: [address]  [place] [address]   Medication will be filled on 04/13/24.

## 2024-04-13 ENCOUNTER — Other Ambulatory Visit: Payer: Self-pay

## 2024-04-17 ENCOUNTER — Other Ambulatory Visit: Payer: Self-pay | Admitting: *Deleted

## 2024-04-17 MED ORDER — PREDNISONE 5 MG PO TABS: ORAL_TABLET | ORAL | 0 refills | Status: AC

## 2024-04-17 NOTE — Addendum Note (Signed)
 Addended by: CENA ALFONSO CROME on: 04/17/2024 12:28 PM   Modules accepted: Orders

## 2024-04-17 NOTE — Telephone Encounter (Signed)
 Patient advised we will be sending a prescription for a prednisone  taper to the pharmacy.

## 2024-04-17 NOTE — Telephone Encounter (Signed)
Ok to send in prednisone taper starting at 20 mg tapering by 5 mg every 4 days.

## 2024-04-17 NOTE — Telephone Encounter (Signed)
 Patient contacted the office stating she is having trouble with her Left shoulder. Patient states it started on Tuesday. Patient denies any injury or falls. Nothing helps., Patient states she has tried ice which does not help. Patient states her last Kevzara  was 04/07/2024. Patient states she is not due to see pain management until 05/14/2024. Patient is requesting a prednisone  taper. Please advise.

## 2024-05-01 ENCOUNTER — Other Ambulatory Visit: Payer: Self-pay

## 2024-05-05 ENCOUNTER — Other Ambulatory Visit: Payer: Self-pay

## 2024-05-07 ENCOUNTER — Other Ambulatory Visit: Payer: Self-pay

## 2024-05-07 NOTE — Progress Notes (Signed)
 Specialty Pharmacy Refill Coordination Note  Christina Jenkins is a 63 y.o. female contacted today regarding refills of specialty medication(s) Sarilumab  (Kevzara )   Patient requested Delivery   Delivery date: 05/14/24   Verified address: [address]  [place] [address]   Medication will be filled on 05/13/24.

## 2024-05-08 ENCOUNTER — Ambulatory Visit (HOSPITAL_COMMUNITY)
Admission: RE | Admit: 2024-05-08 | Discharge: 2024-05-08 | Disposition: A | Discharge status: Home / Self Care | Source: Ambulatory Visit | Attending: Hematology | Admitting: Hematology

## 2024-05-08 ENCOUNTER — Inpatient Hospital Stay: Attending: Oncology

## 2024-05-08 DIAGNOSIS — C3412 Malignant neoplasm of upper lobe, left bronchus or lung: Secondary | ICD-10-CM | POA: Insufficient documentation

## 2024-05-08 DIAGNOSIS — I700 Atherosclerosis of aorta: Secondary | ICD-10-CM | POA: Diagnosis not present

## 2024-05-08 DIAGNOSIS — Z7984 Long term (current) use of oral hypoglycemic drugs: Secondary | ICD-10-CM | POA: Insufficient documentation

## 2024-05-08 DIAGNOSIS — Z79899 Other long term (current) drug therapy: Secondary | ICD-10-CM | POA: Diagnosis not present

## 2024-05-08 DIAGNOSIS — Z7969 Long term (current) use of other immunomodulators and immunosuppressants: Secondary | ICD-10-CM | POA: Diagnosis not present

## 2024-05-08 DIAGNOSIS — Z87891 Personal history of nicotine dependence: Secondary | ICD-10-CM | POA: Insufficient documentation

## 2024-05-08 DIAGNOSIS — J432 Centrilobular emphysema: Secondary | ICD-10-CM | POA: Diagnosis not present

## 2024-05-08 LAB — CBC WITH DIFFERENTIAL/PLATELET
Abs Immature Granulocytes: 0.01 K/uL (ref 0.00–0.07)
Basophils Absolute: 0.1 K/uL (ref 0.0–0.1)
Basophils Relative: 1 %
Eosinophils Absolute: 0.1 K/uL (ref 0.0–0.5)
Eosinophils Relative: 2 %
HCT: 44.9 % (ref 36.0–46.0)
Hemoglobin: 15 g/dL (ref 12.0–15.0)
Immature Granulocytes: 0 %
Lymphocytes Relative: 20 %
Lymphs Abs: 1.5 K/uL (ref 0.7–4.0)
MCH: 31.1 pg (ref 26.0–34.0)
MCHC: 33.4 g/dL (ref 30.0–36.0)
MCV: 93 fL (ref 80.0–100.0)
Monocytes Absolute: 0.7 K/uL (ref 0.1–1.0)
Monocytes Relative: 9 %
Neutro Abs: 5.2 K/uL (ref 1.7–7.7)
Neutrophils Relative %: 68 %
Platelets: 252 K/uL (ref 150–400)
RBC: 4.83 MIL/uL (ref 3.87–5.11)
RDW: 11.6 % (ref 11.5–15.5)
WBC: 7.7 K/uL (ref 4.0–10.5)
nRBC: 0 % (ref 0.0–0.2)

## 2024-05-08 LAB — COMPREHENSIVE METABOLIC PANEL WITH GFR
ALT: 27 U/L (ref 0–44)
AST: 25 U/L (ref 15–41)
Albumin: 4 g/dL (ref 3.5–5.0)
Alkaline Phosphatase: 69 U/L (ref 38–126)
Anion gap: 15 (ref 5–15)
BUN: 13 mg/dL (ref 8–23)
CO2: 25 mmol/L (ref 22–32)
Calcium: 9 mg/dL (ref 8.9–10.3)
Chloride: 101 mmol/L (ref 98–111)
Creatinine, Ser: 0.88 mg/dL (ref 0.44–1.00)
GFR, Estimated: >60 mL/min (ref >60)
Glucose, Bld: 111 mg/dL — ABNORMAL HIGH (ref 70–99)
Potassium: 3.5 mmol/L (ref 3.5–5.1)
Sodium: 141 mmol/L (ref 135–145)
Total Bilirubin: 0.8 mg/dL (ref 0.0–1.2)
Total Protein: 6.8 g/dL (ref 6.5–8.1)

## 2024-05-08 MED ORDER — IOHEXOL 300 MG/ML  SOLN
75.0000 mL | Freq: Once | INTRAMUSCULAR | Status: AC | PRN
  Administered 2024-05-08: 75 mL via INTRAVENOUS

## 2024-05-12 ENCOUNTER — Other Ambulatory Visit: Payer: Self-pay | Admitting: Physician Assistant

## 2024-05-12 DIAGNOSIS — M0579 Rheumatoid arthritis with rheumatoid factor of multiple sites without organ or systems involvement: Secondary | ICD-10-CM

## 2024-05-13 ENCOUNTER — Other Ambulatory Visit: Payer: Self-pay

## 2024-05-14 ENCOUNTER — Other Ambulatory Visit: Payer: Self-pay

## 2024-05-14 ENCOUNTER — Encounter: Attending: Physical Medicine & Rehabilitation | Admitting: Physical Medicine & Rehabilitation

## 2024-05-14 ENCOUNTER — Encounter: Payer: Self-pay | Admitting: Physical Medicine & Rehabilitation

## 2024-05-14 VITALS — BP 114/75 | HR 108 | Ht 66.0 in | Wt 185.0 lb

## 2024-05-14 DIAGNOSIS — G894 Chronic pain syndrome: Secondary | ICD-10-CM | POA: Diagnosis not present

## 2024-05-14 DIAGNOSIS — Z79891 Long term (current) use of opiate analgesic: Secondary | ICD-10-CM | POA: Insufficient documentation

## 2024-05-14 DIAGNOSIS — M0579 Rheumatoid arthritis with rheumatoid factor of multiple sites without organ or systems involvement: Secondary | ICD-10-CM

## 2024-05-14 DIAGNOSIS — Z5181 Encounter for therapeutic drug level monitoring: Secondary | ICD-10-CM | POA: Diagnosis not present

## 2024-05-14 DIAGNOSIS — M059 Rheumatoid arthritis with rheumatoid factor, unspecified: Secondary | ICD-10-CM | POA: Insufficient documentation

## 2024-05-14 MED ORDER — PREDNISONE 5 MG PO TABS: 5.0000 mg | ORAL_TABLET | Freq: Every day | ORAL | 2 refills | Status: DC

## 2024-05-14 NOTE — Progress Notes (Addendum)
 Subjective:    Patient ID: Christina Jenkins, female    DOB: 12-21-1960, 63 y.o.   MRN: [id number]  HPI  CC: RA pain  History of Present Illness: Christina Jenkins is a 63 y.o. year old female  who  has a past medical history of Adenocarcinoma of upper lobe of left lung (HCC), Carpal tunnel syndrome, COPD (chronic obstructive pulmonary disease) (HCC), Coronary artery calcification seen on CT scan, Depression, Essential hypertension, Hepatitis C, Hypokalemia, Mixed hyperlipidemia, Rheumatoid arthritis (HCC), Rheumatoid arthritis (HCC), Seasonal allergies, Type 2 diabetes mellitus (HCC), and Varicose veins of legs.   They are presenting to PM&R clinic as a new patient for pain management evaluation. They were referred by Cheryl Birmingham PA-C for treatment of RA pain.    Rheumatoid arthritis diagnosed circa 2018. Initial presentation included hand pain and morning stiffness, initially investigated for carpal tunnel syndrome which was found to be minimal. Current pain is located in the hands (MCP joints), feet, shoulders, and occasionally hips, affecting both sides symmetrically. Describes a constant, dull pain with intermittent stabbing episodes. Pain is rated 7/10 at its worst. Reports associated numbness and tingling in the hands. Aggravating factors are activity-dependent. Ice provides more relief than heat.  Reports being careful due to age and prednisone  use increasing fracture risk. No falls. No pain radiating into arms or legs. Gait appears stable.   Past Medical History - Rheumatoid arthritis (diagnosed 2018) - Lung cancer: Diagnosed and surgically removed in 2024. No chemotherapy or radiation was required. Follows up with oncology. - Seizure disorder - Cataracts: Scheduled for surgery next month. - Bunions  Medications: - Kevzara  every 2 weeks - Arava  - Prednisone  - Keppra  - Lasix  - Cymbalta   Medication History - Topical creams (e.g., Voltaren , Ben-Gay): Ineffective. - NSAIDs:  Avoids due to prednisone  use; also reports no benefit. - Acetaminophen : Ineffective. - Gabapentin Reyna: Not trialed. - Tramadol : Ineffective. - Oxycodone : Used post-operatively for lung surgery with good effect and no side effects. - Amitriptyline/Nortriptyline: No benefit.  Social History: Retired Surveyor, mining. Denies history of illicit drug use or alcohol abuse. No personal or family history of bipolar disorder or ADHD.  Lives with husband who assists with household chores.    Red flag symptoms: No red flags for back pain endorsed in Hx or ROS  Medications tried: Topical medications Didn't help, tried several  Nsaids Do not help much, also limited by prednisone   Tylenol   Didn't help  Opiates  Oxycodone - helped after surgery  Tramadol - didn't help much  Gabapentin  / Lyrica  Denies  TCAs - Denies  SNRIs  - On Cymbalta  for mood  ICE helps the pain   Other treatments: PT- Denies  TENs unit- Denies  Injections - Shoulder injections- helps, hip injection. These help for a month or so Surgery- Lung surgery for cancer   Goals for pain control: something of occasional use when pain is very severe   Prior UDS results: No results found for: LABOPIA, COCAINSCRNUR, LABBENZ, AMPHETMU, THCU, LABBARB    Pain Inventory Average Pain 6 Pain Right Now 6 My pain is aching  In the last 24 hours, has pain interfered with the following? General activity 6 Relation with others 0 Enjoyment of life 0 What TIME of day is your pain at its worst? morning  and night Sleep (in general) Fair  Pain is worse with: bending and inactivity Pain improves with: heat/ice Relief from Meds: 2  how many minutes can you walk? 10 ability to climb steps?  no do you drive?  yes  disabled: date disabled 06/2017 I need assistance with the following:  household duties  numbness tingling anxiety  New pt  New pt    Family History  Problem Relation Age of Onset   Diabetes Mother     Pancreatic cancer Mother    Diabetes Father    COPD Father    Dementia Father    Diabetes Brother    Social History   Socioeconomic History   Marital status: Married    Spouse name: Not on file   Number of children: 1   Years of education: Not on file   Highest education level: Not on file  Occupational History   Not on file  Tobacco Use   Smoking status: Some Days    Current packs/day: 0.50    Average packs/day: 2.0 packs/day for 43.6 years (85.7 ttl pk-yrs)    Types: Cigarettes    Start date: 10/18/1980    Passive exposure: Current   Smokeless tobacco: Never  Vaping Use   Vaping status: Never Used  Substance and Sexual Activity   Alcohol use: No   Drug use: No   Sexual activity: Not on file  Other Topics Concern   Not on file  Social History Narrative   1 biological child, 1 stepchild   Social Drivers of Corporate investment banker Strain: Not on file  Food Insecurity: No Food Insecurity (05/04/2023)   Hunger Vital Sign    Worried About Running Out of Food in the Last Year: Never true    Ran Out of Food in the Last Year: Never true  Transportation Needs: No Transportation Needs (05/04/2023)   PRAPARE - Administrator, Civil Service (Medical): No    Lack of Transportation (Non-Medical): No  Physical Activity: Not on file  Stress: Not on file  Social Connections: Not on file   Past Surgical History:  Procedure Laterality Date   BACK SURGERY     BRONCHIAL BIOPSY  04/02/2023   Procedure: BRONCHIAL BIOPSIES;  Surgeon: Brenna Adine CROME, DO;  Location: [facility];  Service: Pulmonary;;   BRONCHIAL BRUSHINGS  04/02/2023   Procedure: BRONCHIAL BRUSHINGS;  Surgeon: Brenna Adine CROME, DO;  Location: [facility];  Service: Pulmonary;;   BRONCHIAL NEEDLE ASPIRATION BIOPSY  04/02/2023   Procedure: BRONCHIAL NEEDLE ASPIRATION BIOPSIES;  Surgeon: Brenna Adine CROME, DO;  Location: [facility];  Service: Pulmonary;;   FIDUCIAL MARKER PLACEMENT  04/02/2023    Procedure: FIDUCIAL MARKER PLACEMENT;  Surgeon: Brenna Adine CROME, DO;  Location: [facility];  Service: Pulmonary;;   INTERCOSTAL NERVE BLOCK Left 05/03/2023   Procedure: INTERCOSTAL NERVE BLOCK;  Surgeon: Kerrin Elspeth BROCKS, MD;  Location: [facility] OR;  Service: Thoracic;  Laterality: Left;   LEFT HEART CATH AND CORONARY ANGIOGRAPHY N/A 04/30/2019   Procedure: LEFT HEART CATH AND CORONARY ANGIOGRAPHY;  Surgeon: Court Dorn PARAS, MD;  Location: [facility];  Service: Cardiovascular;  Laterality: N/A;   NODE DISSECTION Left 05/03/2023   Procedure: NODE DISSECTION;  Surgeon: Kerrin Elspeth BROCKS, MD;  Location: [facility] OR;  Service: Thoracic;  Laterality: Left;   TONSILLECTOMY     TUBAL LIGATION     Past Medical History:  Diagnosis Date   Adenocarcinoma of upper lobe of left lung (HCC)    Carpal tunnel syndrome    COPD (chronic obstructive pulmonary disease) (HCC)    Coronary artery calcification seen on CT scan    Normal coronaries at cardiac catheterization 2020  Depression    Essential hypertension    Hepatitis C    Hypokalemia    Mixed hyperlipidemia    Rheumatoid arthritis (HCC)    Rheumatoid arthritis (HCC)    Seasonal allergies    Type 2 diabetes mellitus (HCC)    Varicose veins of legs    BP 114/75   Pulse (!) 108   Ht 5' 6 (1.676 m)   Wt 185 lb (83.9 kg)   SpO2 94%   BMI 29.86 kg/m   Opioid Risk Score:   Fall Risk Score:  `1  Depression screen [facility] 2/9     05/14/2024   11:01 AM 06/03/2023    8:36 AM  Depression screen PHQ 2/9  Decreased Interest 1 0  Down, Depressed, Hopeless 0 0  PHQ - 2 Score 1 0  Altered sleeping 0   Tired, decreased energy 1   Change in appetite 0   Feeling bad or failure about yourself  0   Trouble concentrating 0   Moving slowly or fidgety/restless 0   Suicidal thoughts 0   PHQ-9 Score 2   Difficult doing work/chores Not difficult at all      Review of Systems  Constitutional:  Positive for diaphoresis.  Musculoskeletal:         B/L shoulder, hand, hip and foot pain  Neurological:  Positive for numbness.  Psychiatric/Behavioral:  The patient is nervous/anxious.   All other systems reviewed and are negative.      Objective:   Physical Exam  Gen: no distress, normal appearing HEENT: oral mucosa pink and moist, NCAT Chest: normal effort, normal rate of breathing Abd: soft, non-distended Ext: no edema Psych: pleasant, normal affect Skin: intact Neuro: Alert and awake, follows commands, cranial nerves II through XII grossly intact, normal speech and language RUE: 5/5 Deltoid, 5/5 Biceps, 5/5 Triceps, 5/5 Wrist Ext, 4/5 Grip LUE: 5/5 Deltoid, 5/5 Biceps, 5/5 Triceps, 5/5 Wrist Ext, 4/5 Grip RLE: HF 5/5, KE 5/5, ADF 5/5, APF 5/5 LLE: HF 5/5, KE 5/5, ADF 5/5, APF 5/5 Sensory exam normal for light touch and pain in all 4 limbs. No limb ataxia or cerebellar signs. No abnormal tone appreciated.   Musculoskeletal:  Fair range of motion lumbar spine in flexion extension without significant pain reported No significant lumbar spine TTP Nodules noted on bilateral lateral feet, bilateral hallux valgus TTP left greater than right shoulder, pain with passive range of motion Left greater than right shoulder tenderness to palpation, pain with ROM Mild redness and tenderness noted over bilateral MCP joints TTP in bilateral hands and feet Gait: Stable, no assistive device used. No significant greater trochanter tenderness today Knees nontender with full ROM No significant hip pain today with hip ROM        Assessment & Plan:   Chronic pain secondary to rheumatoid arthritis, inadequately controlled. Pain is multifocal, affecting hands, feet, hips and shoulders. Previous trials of non-opioid analgesics, topical agents, and adjunctive medications have been unsuccessful. Opiate risk tool low  1) Rheumatoid arthritis with associated polyarthralgia 2) History of lung cancer  - Will hold off on TENS unit for this  reason 3) Chronic pain syndrome  - Opiate risk tool low 4) seizure disorder history  Plan 1.  Pain Management: Due to the inefficacy of other medications and contraindication to tramadol  (seizure history)     -   Plan to start hydrocodone  5 mg twice daily as needed      -   Patient counseled that per practice  policy, initiation requires a baseline urine/oral fluid drug screen and a signed pain management agreement.     -   Urine/oral drug screen to be collected today.     -   Patient advised to call the office in one week to review results, at which time the prescription can be sent if results are appropriate. 2.  Non-pharmacologic Management:     -   Continue use of ice for symptomatic relief.     -   Continue general stretching exercises to maintain joint mobility.     -   Provided with a handout on an anti-inflammatory diet. Discussed potential benefits of turmeric and tart cherry juice.     -   Advised to wear comfortable, appropriately  fitting shoes. 3.  Injections: Discussed that corticosteroid injections to shoulder/hip are an option for severe focal flares in the future.    Follow-up: Scheduled to return to clinic in one month to assess medication efficacy and tolerability.  05/20/24 Will order medication as above, tramadol  noted on UDS, previously prescribed which she tried again to see if it would work

## 2024-05-14 NOTE — Telephone Encounter (Signed)
 Patient contacted the office and states she needs a refill of her Prednisone  5 mg daily. Advised the patient the refill request is early. Patient states she may have taken extra tablets when she was having a flare and she only has five tablets left now. Advised the patient I would send a message to the provider.   Last Fill: 03/04/2024  Next Visit: 06/04/2024  Last Visit: 03/04/2024  Dx: Rheumatoid arthritis with rheumatoid factor of multiple sites without organ or systems involvement (HCC)   Current Dose per office note on 03/04/2024: Prednisone  5 mg daily.   Okay to refill Prednisone ?

## 2024-05-18 LAB — DRUG TOX MONITOR 1 W/CONF, ORAL FLD
Amphetamines: NEGATIVE ng/mL (ref <10)
Barbiturates: NEGATIVE ng/mL (ref <10)
Benzodiazepines: NEGATIVE ng/mL (ref <0.50)
Buprenorphine: NEGATIVE ng/mL (ref <0.10)
Cocaine: NEGATIVE ng/mL (ref <5.0)
Cotinine: 63.6 ng/mL — ABNORMAL HIGH (ref <5.0)
Fentanyl: NEGATIVE ng/mL (ref <0.10)
Heroin Metabolite: NEGATIVE ng/mL (ref <1.0)
MARIJUANA: NEGATIVE ng/mL (ref <2.5)
MDMA: NEGATIVE ng/mL (ref <10)
Meprobamate: NEGATIVE ng/mL (ref <2.5)
Methadone: NEGATIVE ng/mL (ref <5.0)
Nicotine Metabolite: POSITIVE ng/mL — AB (ref <5.0)
Opiates: NEGATIVE ng/mL (ref <2.5)
Phencyclidine: NEGATIVE ng/mL (ref <10)
Tapentadol: NEGATIVE ng/mL (ref <5.0)
Tramadol: 7 ng/mL — ABNORMAL HIGH (ref <5.0)
Tramadol: POSITIVE ng/mL — AB (ref <5.0)
Zolpidem: NEGATIVE ng/mL (ref <5.0)

## 2024-05-18 LAB — DRUG TOX ALC METAB W/CON, ORAL FLD: Alcohol Metabolite: NEGATIVE ng/mL (ref <25)

## 2024-05-19 ENCOUNTER — Telehealth: Payer: Self-pay | Admitting: *Deleted

## 2024-05-19 NOTE — Telephone Encounter (Signed)
 Christina Jenkins called because she received test results in her mychart from her oral swab. It shows Tramadol  which she had a previous rx and had tried it again to see if it would work. She made him aware of tramadol  at the time of her visit. She is waiting on a prescription from Dr Ulysees.

## 2024-05-20 MED ORDER — HYDROCODONE-ACETAMINOPHEN 5-325 MG PO TABS: 1.0000 | ORAL_TABLET | Freq: Two times a day (BID) | ORAL | 0 refills | Status: DC | PRN

## 2024-05-20 NOTE — Addendum Note (Signed)
 Addended by: URBANO ALBRIGHT on: 05/20/2024 05:26 PM   Modules accepted: Orders

## 2024-05-20 NOTE — Telephone Encounter (Signed)
 Patient stated she out of pain medication. She wanted to know if you will be prescribing pain meds for her?   Call back phone [phone]. Thank you

## 2024-05-21 ENCOUNTER — Inpatient Hospital Stay (HOSPITAL_BASED_OUTPATIENT_CLINIC_OR_DEPARTMENT_OTHER): Admitting: Oncology

## 2024-05-21 VITALS — BP 101/65 | HR 109 | Temp 98.2°F | Resp 20 | Wt 187.6 lb

## 2024-05-21 DIAGNOSIS — Z79899 Other long term (current) drug therapy: Secondary | ICD-10-CM | POA: Diagnosis not present

## 2024-05-21 DIAGNOSIS — Z7969 Long term (current) use of other immunomodulators and immunosuppressants: Secondary | ICD-10-CM | POA: Diagnosis not present

## 2024-05-21 DIAGNOSIS — F17200 Nicotine dependence, unspecified, uncomplicated: Secondary | ICD-10-CM

## 2024-05-21 DIAGNOSIS — C3412 Malignant neoplasm of upper lobe, left bronchus or lung: Secondary | ICD-10-CM

## 2024-05-21 DIAGNOSIS — Z7984 Long term (current) use of oral hypoglycemic drugs: Secondary | ICD-10-CM | POA: Diagnosis not present

## 2024-05-21 DIAGNOSIS — Z87891 Personal history of nicotine dependence: Secondary | ICD-10-CM | POA: Diagnosis not present

## 2024-05-21 NOTE — Patient Instructions (Addendum)
 [facility] at [facility] Discharge Instructions   You were seen and examined today by Dr. Davonna.  She reviewed the results of your lab work which are normal/stable.   She reviewed the results of your CT scan which did not show any evidence of cancer.   We will see you back in 6 months. We will repeat a CT scan prior to this visit.    Return as scheduled.    Thank you for choosing [facility] at [facility] to provide your oncology and hematology care.  To afford each patient quality time with our provider, please arrive at least 15 minutes before your scheduled appointment time.   If you have a lab appointment with the Cancer Center please come in thru the Main Entrance and check in at the main information desk.  You need to re-schedule your appointment should you arrive 10 or more minutes late.  We strive to give you quality time with our providers, and arriving late affects you and other patients whose appointments are after yours.  Also, if you no show three or more times for appointments you may be dismissed from the clinic at the providers discretion.     Again, thank you for choosing [facility].  Our hope is that these requests will decrease the amount of time that you wait before being seen by our physicians.       _____________________________________________________________  Should you have questions after your visit to [facility], please contact our office at [phone] and follow the prompts.  Our office hours are 8:00 a.m. and 4:30 p.m. Monday - Friday.  Please note that voicemails left after 4:00 p.m. may not be returned until the following business day.  We are closed weekends and major holidays.  You do have access to a nurse 24-7, just call the main number to the clinic [phone] and do not press any options, hold on the line and a nurse will answer the phone.    For prescription refill  requests, have your pharmacy contact our office and allow 72 hours.    Due to Covid, you will need to wear a mask upon entering the hospital. If you do not have a mask, a mask will be given to you at the Main Entrance upon arrival. For doctor visits, patients may have 1 support person age 14 or older with them. For treatment visits, patients can not have anyone with them due to social distancing guidelines and our immunocompromised population.

## 2024-05-21 NOTE — Assessment & Plan Note (Addendum)
 Patient used to be avid smoker Quit after cancer diagnosis in 2024.

## 2024-05-21 NOTE — Progress Notes (Signed)
 Patient Care Team: Jolee Greig VEAR DEVONNA as PCP - General (General Practice) Debera Jayson MATSU, MD as PCP - Cardiology (Cardiology) Case, Elspeth CROME, MD as Attending Physician (Orthopedic Surgery) Dolphus Reiter, MD as Consulting Physician (Rheumatology)  Clinic Day:  05/21/2024  Referring physician: Jolee Greig VEAR, PA-C   CHIEF COMPLAINT:  CC: Stage I (T1b N0 M0) adenocarcinoma of the left upper lobe of the lung   Christina Jenkins 63 y.o. female was transferred to my care after her prior physician has left.   ASSESSMENT & PLAN:   Assessment & Plan: Christina Jenkins  is a 63 y.o. female with Stage I adenocarcinoma of left lung  Assessment & Plan Primary adenocarcinoma of upper lobe of left lung (HCC) Initially diagnosed of adenocarcinoma of the left upper lobe in July of 2024 S/p lobectomy with lymph node dissection.  Extensive oncology history below pT1b pN0 Currently on surveillance  - We reviewed the CT scan results together in detail.  There is no evidence of disease , does have some scar tissue from the surgery in the left lung. - Will continue surveillance per NCCN guidelines:  - H&P and CT chest with or without contrast every 6 months for 2 to 3 years, then H&P and a low-dose noncontrast enhanced CT chest annually -Patient has no complaints today  Return to clinic in 6 months with a CT scan Tobacco use disorder Patient used to be avid smoker Quit after cancer diagnosis in 2024.    The patient understands the plans discussed today and is in agreement with them.  She knows to contact our office if she develops concerns prior to her next appointment.  30 minutes of total time was spent for this patient encounter, including preparation,review of records,  face-to-face counseling with the patient and coordination of care, physical exam, and documentation of the encounter.    LILLETTE Verneta SAUNDERS Teague,acting as a Neurosurgeon for Mickiel Dry, MD.,have documented all relevant  documentation on the behalf of Mickiel Dry, MD,as directed by  Mickiel Dry, MD while in the presence of Mickiel Dry, MD.  I, Mickiel Dry MD, have reviewed the above documentation for accuracy and completeness, and I agree with the above.    Mickiel Dry, MD  [facility] - A DEPT OF [facility] [address] [place] [address] Dept: [phone] Dept Fax: [phone]   Orders Placed This Encounter  Procedures   CT CHEST W CONTRAST    Standing Status:   Future    Expected Date:   11/18/2024    Expiration Date:   05/21/2025    If indicated for the ordered procedure, I authorize the administration of contrast media per Radiology protocol:   Yes    Does the patient have a contrast media/X-ray dye allergy?:   No    Preferred imaging location?:   [facility]     ONCOLOGY HISTORY:   I have reviewed her chart and materials related to her cancer extensively and collaborated history with the patient. Summary of oncologic history is as follows:   Diagnosis: Stage I (T1b N0 M0) adenocarcinoma of the left upper lobe of the lung   -07/24/2021: CT lung cancer screening:A left upper lobe non solid pulmonary nodule is similar at volume derived equivalent  diameter 12.3 mm. Lung-RADS 2  -02/11/2023: CT lung cancer screening: Low growing mixed solid and sub solid lesion in the left upper lobe now categorized as Lung-RADS  4BS, suspicious for primary bronchogenic adenocarcinoma.  -03/22/2023: super D chest CT: Part solid nodule of the left upper lobe is unchanged in size when compared with the prior exam and concerning for primary lung adenocarcinoma. -04/02/2023: Bronchoscopy with LUL FNA. Cytology: Non-small cell carcinoma, morphologically favor adenocarcinoma.  -04/18/2023: Initial PET: 18 mm left upper lobe nodule, corresponding to the patient's known primary bronchogenic carcinoma. No evidence of  metastatic disease. -05/03/2023: Robotic left VATS, lingular sparing left upper lobectomy, lymph node dissection. Pathology: Invasive adenocarcinoma, 1.2 cm, pT1b pN0. All margins negative for carcinoma. No lymphovascular or pleural invasion identified. All thirteen lymph nodes negative for metastatic carcinoma (0/13).  -10/30/2023: CT Chest:  Interval left upper lobe wedge resection with collapse/consolidation in the left upper lobe. No evidence of metastatic disease.   -05/08/2024: CT Chest: NED  Current Treatment: Surveillance  INTERVAL HISTORY:   Christina Jenkins is here today for follow up and to establish care with me for a stage I adenocarcinoma of lung s/p lobectomy.  She has a persistent cough, which she attributes to the prednisone  she takes for rheumatoid arthritis. The cough began after her lung surgery last year. No difficulty breathing or shortness of breath.  She has a history of smoking but has quit for a year. She lives in [place] with her husband.   I have reviewed the past medical history, past surgical history, social history and family history with the patient and they are unchanged from previous note.  ALLERGIES:  has no known allergies.  MEDICATIONS:  Current Outpatient Medications  Medication Sig Dispense Refill   cetirizine (ZYRTEC) 10 MG tablet Take 10 mg by mouth at bedtime.     DULoxetine  (CYMBALTA ) 60 MG capsule Take 120 mg by mouth at bedtime.     furosemide  (LASIX ) 20 MG tablet Take 40 mg by mouth in the morning.     HYDROcodone -acetaminophen  (NORCO/VICODIN) 5-325 MG tablet Take 1 tablet by mouth every 12 (twelve) hours as needed for moderate pain (pain score 4-6). 60 tablet 0   ibuprofen (ADVIL) 200 MG tablet Take 400 mg by mouth every 8 (eight) hours as needed for moderate pain.     leflunomide  (ARAVA ) 20 MG tablet TAKE 1 TABLET(20 MG) BY MOUTH DAILY 90 tablet 0   levETIRAcetam  (KEPPRA  XR) 500 MG 24 hr tablet Take 1 tablet (500 mg total) by mouth daily. 30  tablet 0   lisinopril  (ZESTRIL ) 10 MG tablet Take 10 mg by mouth at bedtime.     metFORMIN  (GLUCOPHAGE -XR) 500 MG 24 hr tablet Take 1,000 mg by mouth daily after supper.     Multiple Vitamin (MULTIVITAMIN) tablet Take 1 tablet by mouth at bedtime.     Potassium Chloride  ER 20 MEQ TBCR Take 20 mEq by mouth at bedtime.     predniSONE  (DELTASONE ) 5 MG tablet Take 4 tabs po x 4 days, 3  tabs po x 4 days, 2  tabs po x 4 days, 1  tab po x 4 days. Take in the morning with breakfast. Do not take any NSAIDS. 40 tablet 0   predniSONE  (DELTASONE ) 5 MG tablet Take 4 tabs po x 4 days, 3  tabs po x 4 days, 2  tabs po x 4 days, 1  tab po x 4 days. Take in the morning with breakfast. Do not take any NSAIDS. 40 tablet 0   predniSONE  (DELTASONE ) 5 MG tablet Take 1 tablet (5 mg total) by mouth daily with breakfast. 30 tablet 2   rosuvastatin  (CRESTOR )  20 MG tablet Take 20 mg by mouth at bedtime.     Sarilumab  (KEVZARA ) 200 MG/1. SOAJ Inject 200 mg into the skin every 14 (fourteen) days. 2.28 mL 2   guaiFENesin  (MUCINEX ) 600 MG 12 hr tablet Take 2 tablets (1,200 mg total) by mouth 2 (two) times daily. (Patient not taking: Reported on 05/21/2024) 20 tablet 0   hydroxychloroquine  (PLAQUENIL ) 200 MG tablet Take 1 tablet (200 mg total) by mouth 2 (two) times daily. (Patient not taking: Reported on 05/21/2024) 60 tablet 2   No current facility-administered medications for this visit.    REVIEW OF SYSTEMS:   Constitutional: Denies fevers, chills or abnormal weight loss Eyes: Denies blurriness of vision Ears, nose, mouth, throat, and face: Denies mucositis or sore throat Respiratory: Denies cough, dyspnea or wheezes Cardiovascular: Denies palpitation, chest discomfort or lower extremity swelling Gastrointestinal:  Denies nausea, heartburn or change in bowel habits Skin: Denies abnormal skin rashes Lymphatics: Denies new lymphadenopathy or easy bruising Neurological:Denies numbness, tingling or new  weaknesses Behavioral/Psych: Mood is stable, no new changes  All other systems were reviewed with the patient and are negative.   VITALS:  Blood pressure 101/65, pulse (!) 109, temperature 98.2 F (36.8 C), temperature source Oral, resp. rate 20, weight 187 lb 9.8 oz (85.1 kg), SpO2 97%.  Wt Readings from Last 3 Encounters:  05/21/24 187 lb 9.8 oz (85.1 kg)  05/14/24 185 lb (83.9 kg)  03/04/24 185 lb 9.6 oz (84.2 kg)    Body mass index is 30.28 kg/m.  Performance status (ECOG): 0 - Asymptomatic  PHYSICAL EXAM:   GENERAL:alert, no distress and comfortable SKIN: skin color, texture, turgor are normal, no rashes or significant lesions LUNGS: clear to auscultation and percussion with normal breathing effort HEART: regular rate & rhythm and no murmurs and no lower extremity edema ABDOMEN:abdomen soft, non-tender and normal bowel sounds Musculoskeletal:no cyanosis of digits and no clubbing  NEURO: alert & oriented x 3 with fluent speech, no focal motor/sensory deficits  LABORATORY DATA:  I have reviewed the data as listed  Lab Results  Component Value Date   WBC 7.7 05/08/2024   NEUTROABS 5.2 05/08/2024   HGB 15.0 05/08/2024   HCT 44.9 05/08/2024   MCV 93.0 05/08/2024   PLT 252 05/08/2024      Chemistry      Component Value Date/Time   NA 141 05/08/2024 1212   K 3.5 05/08/2024 1212   CL 101 05/08/2024 1212   CO2 25 05/08/2024 1212   BUN 13 05/08/2024 1212   CREATININE 0.88 05/08/2024 1212   CREATININE 0.82 12/09/2019 1323      Component Value Date/Time   CALCIUM  9.0 05/08/2024 1212   ALKPHOS 69 05/08/2024 1212   AST 25 05/08/2024 1212   ALT 27 05/08/2024 1212   BILITOT 0.8 05/08/2024 1212       RADIOGRAPHIC STUDIES: I have personally reviewed the radiological images as listed and agreed with the findings in the report.  CT CHEST W CONTRAST CLINICAL DATA:  Adenocarcinoma of the left upper lobe. Restaging. * Tracking Code: BO *  EXAM: CT CHEST WITH  CONTRAST  TECHNIQUE: Multidetector CT imaging of the chest was performed during intravenous contrast administration.  RADIATION DOSE REDUCTION: This exam was performed according to the departmental dose-optimization program which includes automated exposure control, adjustment of the mA and/or kV according to patient size and/or use of iterative reconstruction technique.  CONTRAST:  75mL OMNIPAQUE  IOHEXOL  300 MG/ML  SOLN  COMPARISON:  10/30/2023  FINDINGS: Cardiovascular: The heart size is normal. No substantial pericardial effusion. Coronary artery calcification is evident. Mild atherosclerotic calcification is noted in the wall of the thoracic aorta.  Mediastinum/Nodes: No mediastinal lymphadenopathy. There is no hilar lymphadenopathy. The esophagus has normal imaging features. There is no axillary lymphadenopathy.  Lungs/Pleura: Centrilobular emphysema. Stable surgical scarring left apex. No new or progressive findings to suggest recurrent or metastatic disease. Calcified granulomata noted in both lungs. No pleural effusion.  Upper Abdomen: Visualized portion of the upper abdomen shows no acute findings.  Musculoskeletal: No worrisome lytic or sclerotic osseous abnormality.  IMPRESSION: Stable exam. No new or progressive findings to suggest recurrent or metastatic disease.  Aortic Atherosclerosis (ICD10-I70.0) and Emphysema (ICD10-J43.9).  Electronically Signed   By: Camellia Candle M.D.   On: 05/11/2024 11:49

## 2024-05-21 NOTE — Assessment & Plan Note (Signed)
 Initially diagnosed of adenocarcinoma of the left upper lobe in July of 2024 S/p lobectomy with lymph node dissection.  Extensive oncology history below pT1b pN0 Currently on surveillance  - We reviewed the CT scan results together in detail.  There is no evidence of disease , does have some scar tissue from the surgery in the left lung. - Will continue surveillance per NCCN guidelines:  - H&P and CT chest with or without contrast every 6 months for 2 to 3 years, then H&P and a low-dose noncontrast enhanced CT chest annually -Patient has no complaints today  Return to clinic in 6 months with a CT scan

## 2024-05-21 NOTE — Progress Notes (Deleted)
 Office Visit Note  Patient: Christina Jenkins             Date of Birth: 1961/07/22           MRN: [id number]             PCP: Jolee Greig DEL, PA-C Referring: Jolee Greig DEL, PA-C Visit Date: 06/04/2024 Occupation: Data Unavailable  Subjective:  No chief complaint on file.   History of Present Illness: Christina Jenkins is a 63 y.o. female ***     Activities of Daily Living:  Patient reports morning stiffness for *** {minute/hour:19697}.   Patient {ACTIONS;DENIES/REPORTS:21021675::Denies} nocturnal pain.  Difficulty dressing/grooming: {ACTIONS;DENIES/REPORTS:21021675::Denies} Difficulty climbing stairs: {ACTIONS;DENIES/REPORTS:21021675::Denies} Difficulty getting out of chair: {ACTIONS;DENIES/REPORTS:21021675::Denies} Difficulty using hands for taps, buttons, cutlery, and/or writing: {ACTIONS;DENIES/REPORTS:21021675::Denies}  No Rheumatology ROS completed.   PMFS History:  Patient Active Problem List   Diagnosis Date Noted  . Recurrent UTI 10/29/2023  . At risk for infection due to immunosuppression 10/29/2023  . Primary adenocarcinoma of upper lobe of left lung (HCC) 06/02/2023  . S/P partial lobectomy of lung 05/03/2023  . Solitary pulmonary nodule on lung CT 03/02/2023  . COPD GOLD ? / group A 03/02/2023  . Lung nodule 03/02/2023  . Chest pain of uncertain etiology 04/30/2019  . Abnormal nuclear stress test   . Circulatory system disorder 04/18/2019  . Ischemia 04/18/2019  . Hyperglycemia, unspecified 12/30/2018  . Other abnormal glucose 12/30/2018  . History of COPD 05/31/2017  . Mixed hyperlipidemia 04/30/2017  . Fatigue 12/04/2016  . History of seizures 10/23/2016  . History of hepatitis C 10/23/2016  . ANA positive 10/23/2016  . Sicca syndrome (HCC) 10/23/2016  . Venous (peripheral) insufficiency 10/08/2016  . Varicose veins of lower extremities with inflammation 10/08/2016  . Seropositive rheumatoid arthritis (HCC) 08/01/2016  . High risk medication use  08/01/2016  . Hepatitis C 08/01/2016  . Cigarette smoker 08/01/2016  . Depression 08/01/2016  . Complex partial seizure (HCC) 02/02/2016  . MCI (mild cognitive impairment) 11/02/2015  . Arthralgia 11/02/2015  . Carpal tunnel syndrome 10/20/2015  . Syncope 05/17/2015  . Convulsions/seizures (HCC) 05/17/2015  . Tobacco use disorder 02/23/2014  . Edema 09/22/2013  . Osteoarthrosis 06/11/2011  . Hypertension 12/20/2010  . Hypokalemia 12/20/2010    Past Medical History:  Diagnosis Date  . Adenocarcinoma of upper lobe of left lung (HCC)   . Carpal tunnel syndrome   . COPD (chronic obstructive pulmonary disease) (HCC)   . Coronary artery calcification seen on CT scan    Normal coronaries at cardiac catheterization 2020  . Depression   . Essential hypertension   . Hepatitis C   . Hypokalemia   . Mixed hyperlipidemia   . Rheumatoid arthritis (HCC)   . Rheumatoid arthritis (HCC)   . Seasonal allergies   . Type 2 diabetes mellitus (HCC)   . Varicose veins of legs     Family History  Problem Relation Age of Onset  . Diabetes Mother   . Pancreatic cancer Mother   . Diabetes Father   . COPD Father   . Dementia Father   . Diabetes Brother    Past Surgical History:  Procedure Laterality Date  . BACK SURGERY    . BRONCHIAL BIOPSY  04/02/2023   Procedure: BRONCHIAL BIOPSIES;  Surgeon: Brenna Adine CROME, DO;  Location: [facility];  Service: Pulmonary;;  . BRONCHIAL BRUSHINGS  04/02/2023   Procedure: BRONCHIAL BRUSHINGS;  Surgeon: Brenna Adine CROME, DO;  Location: [facility];  Service: Pulmonary;;  .  BRONCHIAL NEEDLE ASPIRATION BIOPSY  04/02/2023   Procedure: BRONCHIAL NEEDLE ASPIRATION BIOPSIES;  Surgeon: Brenna Adine CROME, DO;  Location: [facility];  Service: Pulmonary;;  . FIDUCIAL MARKER PLACEMENT  04/02/2023   Procedure: FIDUCIAL MARKER PLACEMENT;  Surgeon: Brenna Adine CROME, DO;  Location: [facility];  Service: Pulmonary;;  . INTERCOSTAL NERVE BLOCK Left 05/03/2023    Procedure: INTERCOSTAL NERVE BLOCK;  Surgeon: Kerrin Elspeth BROCKS, MD;  Location: [facility] OR;  Service: Thoracic;  Laterality: Left;  . LEFT HEART CATH AND CORONARY ANGIOGRAPHY N/A 04/30/2019   Procedure: LEFT HEART CATH AND CORONARY ANGIOGRAPHY;  Surgeon: Court Dorn PARAS, MD;  Location: [facility];  Service: Cardiovascular;  Laterality: N/A;  . NODE DISSECTION Left 05/03/2023   Procedure: NODE DISSECTION;  Surgeon: Kerrin Elspeth BROCKS, MD;  Location: [facility] OR;  Service: Thoracic;  Laterality: Left;  . TONSILLECTOMY    . TUBAL LIGATION     Social History   Tobacco Use  . Smoking status: Some Days    Current packs/day: 0.50    Average packs/day: 2.0 packs/day for 43.6 years (85.7 ttl pk-yrs)    Types: Cigarettes    Start date: 10/18/1980    Passive exposure: Current  . Smokeless tobacco: Never  Vaping Use  . Vaping status: Never Used  Substance Use Topics  . Alcohol use: No  . Drug use: No   Social History   Social History Narrative   1 biological child, 1 stepchild     Immunization History  Administered Date(s) Administered  . Influenza,inj,Quad PF,6+ Mos 05/16/2017, 05/15/2018, 05/09/2019  . Influenza-Unspecified 05/30/2023  . Moderna Sars-Covid-2 Vaccination 11/16/2019, 12/14/2019, 08/11/2020, 05/30/2023  . Tdap 06/26/2018  . Zoster Recombinant(Shingrix) 09/04/2017     Objective: Vital Signs: There were no vitals taken for this visit.   Physical Exam   Musculoskeletal Exam: ***  CDAI Exam: CDAI Score: -- Patient Global: --; Provider Global: -- Swollen: --; Tender: -- Joint Exam 06/04/2024   No joint exam has been documented for this visit   There is currently no information documented on the homunculus. Go to the Rheumatology activity and complete the homunculus joint exam.  Investigation: No additional findings.  Imaging: CT CHEST W CONTRAST Result Date: 05/11/2024 CLINICAL DATA:  Adenocarcinoma of the left upper lobe. Restaging. * Tracking Code: BO  * EXAM: CT CHEST WITH CONTRAST TECHNIQUE: Multidetector CT imaging of the chest was performed during intravenous contrast administration. RADIATION DOSE REDUCTION: This exam was performed according to the departmental dose-optimization program which includes automated exposure control, adjustment of the mA and/or kV according to patient size and/or use of iterative reconstruction technique. CONTRAST:  75mL OMNIPAQUE  IOHEXOL  300 MG/ML  SOLN COMPARISON:  10/30/2023 FINDINGS: Cardiovascular: The heart size is normal. No substantial pericardial effusion. Coronary artery calcification is evident. Mild atherosclerotic calcification is noted in the wall of the thoracic aorta. Mediastinum/Nodes: No mediastinal lymphadenopathy. There is no hilar lymphadenopathy. The esophagus has normal imaging features. There is no axillary lymphadenopathy. Lungs/Pleura: Centrilobular emphysema. Stable surgical scarring left apex. No new or progressive findings to suggest recurrent or metastatic disease. Calcified granulomata noted in both lungs. No pleural effusion. Upper Abdomen: Visualized portion of the upper abdomen shows no acute findings. Musculoskeletal: No worrisome lytic or sclerotic osseous abnormality. IMPRESSION: Stable exam. No new or progressive findings to suggest recurrent or metastatic disease. Aortic Atherosclerosis (ICD10-I70.0) and Emphysema (ICD10-J43.9). Electronically Signed   By: Camellia Candle M.D.   On: 05/11/2024 11:49    Recent Labs: Lab Results  Component  Value Date   WBC 7.7 05/08/2024   HGB 15.0 05/08/2024   PLT 252 05/08/2024   NA 141 05/08/2024   K 3.5 05/08/2024   CL 101 05/08/2024   CO2 25 05/08/2024   GLUCOSE 111 (H) 05/08/2024   BUN 13 05/08/2024   CREATININE 0.88 05/08/2024   BILITOT 0.8 05/08/2024   ALKPHOS 69 05/08/2024   AST 25 05/08/2024   ALT 27 05/08/2024   PROT 6.8 05/08/2024   ALBUMIN 4.0 05/08/2024   CALCIUM  9.0 05/08/2024   GFRAA 91 12/09/2019   QFTBGOLDPLUS NEGATIVE  12/09/2019    Speciality Comments: Need Hep C quant every 6 months Prior therapy: methotrexate (oral ulcers) and Cimzia  (inadequate response) , Enbrel  stopped Jan 2023 Humira  started 09/27/21  TB Gold: Negative 01/21/2023  Procedures:  No procedures performed Allergies: Patient has no known allergies.   Assessment / Plan:     Visit Diagnoses: Rheumatoid arthritis with rheumatoid factor of multiple sites without organ or systems involvement (HCC)  High risk medication use  Rheumatoid nodule (HCC)  Long term (current) use of systemic steroids  Chronic pain of both shoulders  Primary osteoarthritis of both hands  Primary osteoarthritis of both feet  Trochanteric bursitis of both hips  ANA positive  History of hepatitis C  History of COPD  History of seizures  Former smoker  Orders: No orders of the defined types were placed in this encounter.  No orders of the defined types were placed in this encounter.   Face-to-face time spent with patient was *** minutes. Greater than 50% of time was spent in counseling and coordination of care.  Follow-Up Instructions: No follow-ups on file.   Waddell CHRISTELLA Craze, PA-C  Note - This record has been created using Dragon software.  Chart creation errors have been sought, but may not always  have been located. Such creation errors do not reflect on  the standard of medical care.

## 2024-05-22 ENCOUNTER — Other Ambulatory Visit: Payer: Self-pay | Admitting: Physician Assistant

## 2024-05-22 NOTE — Telephone Encounter (Signed)
 Last Fill: 02/21/2024  Labs: 05/08/2024 Glucose 111  Next Visit: 06/04/2024  Last Visit: 03/04/2024  DX: Rheumatoid arthritis with rheumatoid factor of multiple sites without organ or systems involvement   Current Dose per office note 03/04/2024: Arava  20 mg 1 tablet by mouth daily   Okay to refill Arava  ?

## 2024-06-01 DIAGNOSIS — E1122 Type 2 diabetes mellitus with diabetic chronic kidney disease: Secondary | ICD-10-CM | POA: Diagnosis not present

## 2024-06-01 DIAGNOSIS — R5383 Other fatigue: Secondary | ICD-10-CM | POA: Diagnosis not present

## 2024-06-01 DIAGNOSIS — E7849 Other hyperlipidemia: Secondary | ICD-10-CM | POA: Diagnosis not present

## 2024-06-03 ENCOUNTER — Other Ambulatory Visit: Payer: Self-pay

## 2024-06-04 ENCOUNTER — Ambulatory Visit: Admitting: Physician Assistant

## 2024-06-04 DIAGNOSIS — G8929 Other chronic pain: Secondary | ICD-10-CM

## 2024-06-04 DIAGNOSIS — R053 Chronic cough: Secondary | ICD-10-CM | POA: Diagnosis not present

## 2024-06-04 DIAGNOSIS — Z7952 Long term (current) use of systemic steroids: Secondary | ICD-10-CM

## 2024-06-04 DIAGNOSIS — M19071 Primary osteoarthritis, right ankle and foot: Secondary | ICD-10-CM

## 2024-06-04 DIAGNOSIS — M7061 Trochanteric bursitis, right hip: Secondary | ICD-10-CM

## 2024-06-04 DIAGNOSIS — Z79899 Other long term (current) drug therapy: Secondary | ICD-10-CM

## 2024-06-04 DIAGNOSIS — M0630 Rheumatoid nodule, unspecified site: Secondary | ICD-10-CM

## 2024-06-04 DIAGNOSIS — R768 Other specified abnormal immunological findings in serum: Secondary | ICD-10-CM

## 2024-06-04 DIAGNOSIS — M069 Rheumatoid arthritis, unspecified: Secondary | ICD-10-CM | POA: Diagnosis not present

## 2024-06-04 DIAGNOSIS — M19041 Primary osteoarthritis, right hand: Secondary | ICD-10-CM

## 2024-06-04 DIAGNOSIS — Z8619 Personal history of other infectious and parasitic diseases: Secondary | ICD-10-CM

## 2024-06-04 DIAGNOSIS — M0579 Rheumatoid arthritis with rheumatoid factor of multiple sites without organ or systems involvement: Secondary | ICD-10-CM

## 2024-06-04 DIAGNOSIS — Z87891 Personal history of nicotine dependence: Secondary | ICD-10-CM

## 2024-06-04 DIAGNOSIS — Z8709 Personal history of other diseases of the respiratory system: Secondary | ICD-10-CM

## 2024-06-04 DIAGNOSIS — Z87898 Personal history of other specified conditions: Secondary | ICD-10-CM

## 2024-06-05 ENCOUNTER — Other Ambulatory Visit: Payer: Self-pay

## 2024-06-09 ENCOUNTER — Other Ambulatory Visit: Payer: Self-pay

## 2024-06-11 ENCOUNTER — Encounter: Admitting: Physical Medicine & Rehabilitation

## 2024-06-15 NOTE — Progress Notes (Unsigned)
 Office Visit Note  Patient: Christina Jenkins             Date of Birth: [birth year]           MRN: [id number]             PCP: Jolee Greig DEL, PA-C Referring: Jolee Greig DEL, PA-C Visit Date: 06/29/2024 Occupation: Data Unavailable  Subjective:  Left shoulder pain   History of Present Illness: Christina Jenkins is a 63 y.o. female with history of seropositive rheumatoid arthritis.  Patient remains on Kevzara  200 mg sq injections every 14 days, Arava  20 mg 1 tablet by mouth daily and Prednisone  5 mg daily.  Patient reports that she ran out of her prescription for prednisone  4 to 5 days ago since she was having to increase the dose of prednisone  recently for a flare.  Patient states for the past 2-week she has been experiencing increased pain in the left shoulder.  She is been having to ice her shoulder on a daily basis as well as has had increased pain in the left shoulder at night.  Patient requested a left shoulder joint cortisone injection today.  She continues to experience intermittent pain and stiffness in both hands.  Patient states that she has established care with pain management and is now taking Norco twice daily which has been helpful in managing her pain levels.   Activities of Daily Living:  Patient reports morning stiffness for 1 hour.   Patient Reports nocturnal pain.  Difficulty dressing/grooming: Denies Difficulty climbing stairs: Denies Difficulty getting out of chair: Denies Difficulty using hands for taps, buttons, cutlery, and/or writing: Reports  Review of Systems  Constitutional:  Negative for fatigue.  HENT:  Positive for mouth dryness. Negative for mouth sores.   Eyes:  Negative for dryness.  Respiratory:  Negative for shortness of breath.   Cardiovascular:  Negative for chest pain and palpitations.  Gastrointestinal:  Negative for blood in stool, constipation and diarrhea.  Endocrine: Negative for increased urination.  Genitourinary:  Negative for involuntary  urination.  Musculoskeletal:  Positive for joint pain, joint pain, joint swelling and morning stiffness. Negative for gait problem, myalgias, muscle weakness, muscle tenderness and myalgias.  Skin:  Negative for color change, rash, hair loss and sensitivity to sunlight.  Allergic/Immunologic: Negative for susceptible to infections.  Neurological:  Negative for dizziness and headaches.  Hematological:  Negative for swollen glands.  Psychiatric/Behavioral:  Negative for depressed mood and sleep disturbance. The patient is not nervous/anxious.     PMFS History:  Patient Active Problem List   Diagnosis Date Noted   Recurrent UTI 10/29/2023   At risk for infection due to immunosuppression 10/29/2023   Primary adenocarcinoma of upper lobe of left lung (HCC) 06/02/2023   S/P partial lobectomy of lung 05/03/2023   Solitary pulmonary nodule on lung CT 03/02/2023   COPD GOLD ? / group A 03/02/2023   Lung nodule 03/02/2023   Chest pain of uncertain etiology 04/30/2019   Abnormal nuclear stress test    Circulatory system disorder 04/18/2019   Ischemia 04/18/2019   Hyperglycemia, unspecified 12/30/2018   Other abnormal glucose 12/30/2018   History of COPD 05/31/2017   Mixed hyperlipidemia 04/30/2017   Fatigue 12/04/2016   History of seizures 10/23/2016   History of hepatitis C 10/23/2016   ANA positive 10/23/2016   Sicca syndrome 10/23/2016   Venous (peripheral) insufficiency 10/08/2016   Varicose veins of lower extremities with inflammation 10/08/2016   Seropositive rheumatoid arthritis (HCC)  08/01/2016   High risk medication use 08/01/2016   Hepatitis C 08/01/2016   Cigarette smoker 08/01/2016   Depression 08/01/2016   Complex partial seizure (HCC) 02/02/2016   MCI (mild cognitive impairment) 11/02/2015   Arthralgia 11/02/2015   Carpal tunnel syndrome 10/20/2015   Syncope 05/17/2015   Convulsions/seizures (HCC) 05/17/2015   Tobacco use disorder 02/23/2014   Edema 09/22/2013    Osteoarthrosis 06/11/2011   Hypertension 12/20/2010   Hypokalemia 12/20/2010    Past Medical History:  Diagnosis Date   Adenocarcinoma of upper lobe of left lung (HCC)    Carpal tunnel syndrome    COPD (chronic obstructive pulmonary disease) (HCC)    Coronary artery calcification seen on CT scan    Normal coronaries at cardiac catheterization 2020   Depression    Essential hypertension    Hepatitis C    Hypokalemia    Mixed hyperlipidemia    Rheumatoid arthritis (HCC)    Rheumatoid arthritis (HCC)    Seasonal allergies    Type 2 diabetes mellitus (HCC)    Varicose veins of legs     Family History  Problem Relation Age of Onset   Diabetes Mother    Pancreatic cancer Mother    Diabetes Father    COPD Father    Dementia Father    Diabetes Brother    Past Surgical History:  Procedure Laterality Date   BACK SURGERY     BRONCHIAL BIOPSY  04/02/2023   Procedure: BRONCHIAL BIOPSIES;  Surgeon: Brenna Adine CROME, DO;  Location: [facility];  Service: Pulmonary;;   BRONCHIAL BRUSHINGS  04/02/2023   Procedure: BRONCHIAL BRUSHINGS;  Surgeon: Brenna Adine CROME, DO;  Location: [facility];  Service: Pulmonary;;   BRONCHIAL NEEDLE ASPIRATION BIOPSY  04/02/2023   Procedure: BRONCHIAL NEEDLE ASPIRATION BIOPSIES;  Surgeon: Brenna Adine CROME, DO;  Location: [facility];  Service: Pulmonary;;   FIDUCIAL MARKER PLACEMENT  04/02/2023   Procedure: FIDUCIAL MARKER PLACEMENT;  Surgeon: Brenna Adine CROME, DO;  Location: [facility];  Service: Pulmonary;;   INTERCOSTAL NERVE BLOCK Left 05/03/2023   Procedure: INTERCOSTAL NERVE BLOCK;  Surgeon: Kerrin Elspeth BROCKS, MD;  Location: [facility];  Service: Thoracic;  Laterality: Left;   LEFT HEART CATH AND CORONARY ANGIOGRAPHY N/A 04/30/2019   Procedure: LEFT HEART CATH AND CORONARY ANGIOGRAPHY;  Surgeon: Court Dorn PARAS, MD;  Location: [facility];  Service: Cardiovascular;  Laterality: N/A;   NODE DISSECTION Left 05/03/2023   Procedure: NODE  DISSECTION;  Surgeon: Kerrin Elspeth BROCKS, MD;  Location: [facility];  Service: Thoracic;  Laterality: Left;   TONSILLECTOMY     TUBAL LIGATION     Social History   Tobacco Use   Smoking status: Some Days    Current packs/day: 0.50    Average packs/day: 2.0 packs/day for 43.7 years (85.8 ttl pk-yrs)    Types: Cigarettes    Start date: 10/18/1980    Passive exposure: Current   Smokeless tobacco: Never  Vaping Use   Vaping status: Never Used  Substance Use Topics   Alcohol use: No   Drug use: No   Social History   Social History Narrative   1 biological child, 1 stepchild     Immunization History  Administered Date(s) Administered   Influenza,inj,Quad PF,6+ Mos 05/16/2017, 05/15/2018, 05/09/2019   Influenza-Unspecified 05/30/2023   Moderna Sars-Covid-2 Vaccination 11/16/2019, 12/14/2019, 08/11/2020, 05/30/2023   Tdap 06/26/2018   Zoster Recombinant(Shingrix) 09/04/2017     Objective: Vital Signs: BP 122/78   Pulse 85   Temp  98.1 F (36.7 C)   Resp 14   Ht 5' 6 (1.676 m)   Wt 191 lb 6.4 oz (86.8 kg)   BMI 30.89 kg/m    Physical Exam Vitals and nursing note reviewed.  Constitutional:      Appearance: She is well-developed.  HENT:     Head: Normocephalic and atraumatic.  Eyes:     Conjunctiva/sclera: Conjunctivae normal.  Cardiovascular:     Rate and Rhythm: Normal rate and regular rhythm.     Heart sounds: Normal heart sounds.  Pulmonary:     Effort: Pulmonary effort is normal.     Breath sounds: Normal breath sounds.  Abdominal:     General: Bowel sounds are normal.     Palpations: Abdomen is soft.  Musculoskeletal:     Cervical back: Normal range of motion.  Lymphadenopathy:     Cervical: No cervical adenopathy.  Skin:    General: Skin is warm and dry.     Capillary Refill: Capillary refill takes less than 2 seconds.  Neurological:     Mental Status: She is alert and oriented to person, place, and time.  Psychiatric:        Behavior: Behavior  normal.      Musculoskeletal Exam: C-spine has good ROM.  Postural thoracic kyphosis.  Painful ROM of the left shoulder. Right shoulder has full ROM.  Elbow joints, wrist joints, MCPs, PIPs, and DIPs good ROM with no synovitis.  Complete fist formation bilaterally.  Hip joints have good ROM with no groin pain.  Knee joints have good ROM with no warmth or effusion.  Ankle joints have good ROM with no tenderness or joint swelling.   CDAI Exam: CDAI Score: -- Patient Global: --; Provider Global: -- Swollen: --; Tender: -- Joint Exam 06/29/2024   No joint exam has been documented for this visit   There is currently no information documented on the homunculus. Go to the Rheumatology activity and complete the homunculus joint exam.  Investigation: No additional findings.  Imaging: No results found.   Recent Labs: Lab Results  Component Value Date   WBC 7.7 05/08/2024   HGB 15.0 05/08/2024   PLT 252 05/08/2024   NA 141 05/08/2024   K 3.5 05/08/2024   CL 101 05/08/2024   CO2 25 05/08/2024   GLUCOSE 111 (H) 05/08/2024   BUN 13 05/08/2024   CREATININE 0.88 05/08/2024   BILITOT 0.8 05/08/2024   ALKPHOS 69 05/08/2024   AST 25 05/08/2024   ALT 27 05/08/2024   PROT 6.8 05/08/2024   ALBUMIN 4.0 05/08/2024   CALCIUM  9.0 05/08/2024   GFRAA 91 12/09/2019   QFTBGOLDPLUS NEGATIVE 12/09/2019    Speciality Comments: Need Hep C quant every 6 months Prior therapy: methotrexate (oral ulcers) and Cimzia  (inadequate response) , Enbrel  stopped Jan 2023 Humira  started 09/27/21  TB Gold: Negative 01/21/2023  Procedures:  Large Joint Inj: L glenohumeral on 06/29/2024 8:47 AM Indications: pain Details: 27 G 1.5 in needle, posterior approach  Arthrogram: No  Medications: 1 mL lidocaine  1 %; 40 mg triamcinolone  acetonide 40 MG/ML Aspirate: 0 mL Outcome: tolerated well, no immediate complications Procedure, treatment alternatives, risks and benefits explained, specific risks discussed.  Consent was given by the patient. Immediately prior to procedure a time out was called to verify the correct patient, procedure, equipment, support staff and site/side marked as required. Patient was prepped and draped in the usual sterile fashion.     Allergies: Patient has no known allergies.   Assessment /  Plan:     Visit Diagnoses: Rheumatoid arthritis with rheumatoid factor of multiple sites without organ or systems involvement (HCC) - She presents today with a recurrence of pain in the left shoulder.  No recent injury or fall prior to the onset of symptoms.  Her left shoulder pain has progressively been worsening over the past 2 weeks.  She tried increasing the dose of daily prednisone  as well as icing her shoulder on a daily basis but her symptoms have persisted.  She ran out of her daily prescription of prednisone  5 mg daily about 4 to 5 days ago.  She has remained on Kevzara  200 mg sq injections once every 14 days and is taking Arava  20 mg 1 tablet by mouth daily.  She has not had any recent gaps in therapy.  No recent or recurrent infections.  Treatment options were discussed.  Patient requested a left glenohumeral joint cortisone injection today.  She tolerated procedure well.  Procedure note was completed above.  Aftercare was discussed.  She was advised to notify us  if her symptoms persist or worsen.  She is established care pain management and is now taking Norco twice daily for pain relief.  She will remain on Kevzara  and Arava  as prescribed.  A refill of prednisone  5 mg 1 tablet daily will also be sent to the pharmacy.  She will follow up in 5 months or sooner if needed.   Plan: Lipid panel, predniSONE  (DELTASONE ) 5 MG tablet  High risk medication use - Kevzara  200 mg sq injections every 14 days, Arava  20 mg 1 tablet by mouth daily and Prednisone  5 mg daily.  CBC and CMP updated on 05/08/24. Her next lab work will be due in December and every 3 months.  Chest CT 05/08/24. Lipid panel  updated on 03/03/24.  Hgb A1c 5.8% on 03/03/24.  No recent or recurrent infections.  Discussed the importance of holding kevzara  and arava  if she develops signs or symptoms of an infection and to resume once the infection has completely cleared.  - Plan: Lipid panel  Rheumatoid nodule (HCC): No rheumatoid nodules noted.    Long term (current) use of systemic steroids - She remains on prednisone  5 mg daily.  Frequent prednisone  tapers.  She is aware of the risks of long-term and frequent prednisone  use.  Hgb A1c 5.8% on 03/03/24.    Chronic pain of both shoulders -She presents today with a recurrence of left shoulder pain.  No injury or fall prior to the onset of symptoms.  She has been experiencing nocturnal pain involving the left shoulder.   She has tried icing the shoulder as well as increased the dose of daily prednisone  to try to alleviate her discomfort.  She has also been taking Norco twice daily as prescribed by pain management. Deborah treatment options were discussed today.  Patient requested a left glenohumeral joint cortisone injection.  She tolerated procedure well.  Procedure note was completed above.  Aftercare was discussed.  She was advised to notify us  if her symptoms persist or worsen.  Primary osteoarthritis of both hands: She has PIP and DIP thickening consistent with osteoarthritis of both hands.  No tenderness or joint swelling.   Primary osteoarthritis of both feet: She has good ROM of both ankle joints--no tenderness or swelling.   Trochanteric bursitis of both hips: Intermittent discomfort.   ANA positive: No clinical features of systemic lupus.    Other medical conditions are listed as follows:   History of hepatitis C  History of COPD  History of seizures  Former smoker  Orders: Orders Placed This Encounter  Procedures   Large Joint Inj   Lipid panel   Meds ordered this encounter  Medications   predniSONE  (DELTASONE ) 5 MG tablet    Sig: Take 1 tablet (5  mg total) by mouth daily with breakfast.    Dispense:  30 tablet    Refill:  2     Follow-Up Instructions: Return in about 5 months (around 11/27/2024) for Rheumatoid arthritis.   Waddell CHRISTELLA Craze, PA-C  Note - This record has been created using Dragon software.  Chart creation errors have been sought, but may not always  have been located. Such creation errors do not reflect on  the standard of medical care.

## 2024-06-17 ENCOUNTER — Other Ambulatory Visit: Payer: Self-pay

## 2024-06-17 NOTE — Progress Notes (Signed)
 Specialty Pharmacy Refill Coordination Note  Christina Jenkins is a 63 y.o. female contacted today regarding refills of specialty medication(s) Sarilumab  (Kevzara )   Patient requested Delivery   Delivery date: 06/24/24   Verified address: [address]  [place] [address]   Medication will be filled on 06/23/24.

## 2024-06-19 ENCOUNTER — Encounter: Attending: Physical Medicine & Rehabilitation | Admitting: Physical Medicine & Rehabilitation

## 2024-06-19 ENCOUNTER — Encounter: Payer: Self-pay | Admitting: Physical Medicine & Rehabilitation

## 2024-06-19 VITALS — BP 122/74 | HR 103 | Ht 66.0 in | Wt 188.0 lb

## 2024-06-19 DIAGNOSIS — Z79891 Long term (current) use of opiate analgesic: Secondary | ICD-10-CM | POA: Insufficient documentation

## 2024-06-19 DIAGNOSIS — G894 Chronic pain syndrome: Secondary | ICD-10-CM | POA: Insufficient documentation

## 2024-06-19 DIAGNOSIS — M059 Rheumatoid arthritis with rheumatoid factor, unspecified: Secondary | ICD-10-CM | POA: Insufficient documentation

## 2024-06-19 MED ORDER — HYDROCODONE-ACETAMINOPHEN 5-325 MG PO TABS: 1.0000 | ORAL_TABLET | Freq: Two times a day (BID) | ORAL | 0 refills | Status: DC | PRN

## 2024-06-19 NOTE — Progress Notes (Addendum)
 "  Subjective:    Patient ID: Christina Jenkins, female    DOB: Sep 24, 1960, 63 y.o.   MRN: [id number]  Medication Refill Associated symptoms include diaphoresis and numbness.    CC: RA pain  History of Present Illness: Christina Jenkins is a 63 y.o. year old female  who  has a past medical history of Adenocarcinoma of upper lobe of left lung (HCC), Carpal tunnel syndrome, COPD (chronic obstructive pulmonary disease) (HCC), Coronary artery calcification seen on CT scan, Depression, Essential hypertension, Hepatitis C, Hypokalemia, Mixed hyperlipidemia, Rheumatoid arthritis (HCC), Rheumatoid arthritis (HCC), Seasonal allergies, Type 2 diabetes mellitus (HCC), and Varicose veins of legs.   They are presenting to PM&R clinic as a new patient for pain management evaluation. They were referred by Cheryl Birmingham PA-C for treatment of RA pain.    Rheumatoid arthritis diagnosed circa 2018. Initial presentation included hand pain and morning stiffness, initially investigated for carpal tunnel syndrome which was found to be minimal. Current pain is located in the hands (MCP joints), feet, shoulders, and occasionally hips, affecting both sides symmetrically. Describes a constant, dull pain with intermittent stabbing episodes. Pain is rated 7/10 at its worst. Reports associated numbness and tingling in the hands. Aggravating factors are activity-dependent. Ice provides more relief than heat.  Reports being careful due to age and prednisone  use increasing fracture risk. No falls. No pain radiating into arms or legs. Gait appears stable.   Past Medical History - Rheumatoid arthritis (diagnosed 2018) - Lung cancer: Diagnosed and surgically removed in 2024. No chemotherapy or radiation was required. Follows up with oncology. - Seizure disorder - Cataracts: Scheduled for surgery next month. - Bunions  Medications: - Kevzara  every 2 weeks - Arava  - Prednisone  - Keppra  - Lasix  - Cymbalta   Medication  History - Topical creams (e.g., Voltaren , Ben-Gay): Ineffective. - NSAIDs: Avoids due to prednisone  use; also reports no benefit. - Acetaminophen : Ineffective. - Gabapentin Reyna: Not trialed. - Tramadol : Ineffective. - Oxycodone : Used post-operatively for lung surgery with good effect and no side effects. - Amitriptyline/Nortriptyline: No benefit.  Social History: Retired surveyor, mining. Denies history of illicit drug use or alcohol abuse. No personal or family history of bipolar disorder or ADHD.  Lives with husband who assists with household chores.    Red flag symptoms: No red flags for back pain endorsed in Hx or ROS  Medications tried: Topical medications Didn't help, tried several  Nsaids Do not help much, also limited by prednisone   Tylenol   Didn't help  Opiates  Oxycodone - helped after surgery  Tramadol - didn't help much  Gabapentin  / Lyrica  Denies  TCAs - Denies  SNRIs  - On Cymbalta  for mood  ICE helps the pain   Other treatments: PT- Denies  TENs unit- Denies  Injections - Shoulder injections- helps, hip injection. These help for a month or so Surgery- Lung surgery for cancer   Goals for pain control: something of occasional use when pain is very severe   Prior UDS results: No results found for: LABOPIA, COCAINSCRNUR, LABBENZ, AMPHETMU, THCU, LABBARB   Interval History 06/19/24 Discussed the use of AI scribe software for clinical note transcription with the patient, who gave verbal consent to proceed.   Patient is a follow-up for pain management. Reports new medication, hydrocodone  5 mg twice daily, is working well and is more effective than tramadol . States it is helping with activity levels. Denies side effects such as constipation, confusion, or other issues. Pain is variable in location, consistent with rheumatoid  arthritis. This morning, the pain was in the left shoulder, particularly with movement such as drying off after a shower. Patient  has an upcoming cataract surgery in December and will need to stop their rheumatoid arthritis medication. Discussed that this may lead to a flare-up of pain.  MEDICATIONS REVIEW  - Hydrocodone  5 mg: taking one in the morning and one at night.    Pain Inventory Average Pain 6 Pain Right Now 7 My pain is constant, sharp, dull, and aching  In the last 24 hours, has pain interfered with the following? General activity 2 Relation with others 0 Enjoyment of life 1 What TIME of day is your pain at its worst? morning  and night Sleep (in general) Fair  Pain is worse with: bending and some activites Pain improves with: heat/ice and medication Relief from Meds: 7  how many minutes can you walk? 10 ability to climb steps?  no do you drive?  yes  disabled: date disabled 06/2017 I need assistance with the following:  household duties  numbness tingling anxiety  New pt  New pt    Family History  Problem Relation Age of Onset   Diabetes Mother    Pancreatic cancer Mother    Diabetes Father    COPD Father    Dementia Father    Diabetes Brother    Social History   Socioeconomic History   Marital status: Married    Spouse name: Not on file   Number of children: 1   Years of education: Not on file   Highest education level: Not on file  Occupational History   Not on file  Tobacco Use   Smoking status: Some Days    Current packs/day: 0.50    Average packs/day: 2.0 packs/day for 43.7 years (85.7 ttl pk-yrs)    Types: Cigarettes    Start date: 10/18/1980    Passive exposure: Current   Smokeless tobacco: Never  Vaping Use   Vaping status: Never Used  Substance and Sexual Activity   Alcohol use: No   Drug use: No   Sexual activity: Not on file  Other Topics Concern   Not on file  Social History Narrative   1 biological child, 1 stepchild   Social Drivers of Corporate Investment Banker Strain: Not on file  Food Insecurity: No Food Insecurity (05/04/2023)    Hunger Vital Sign    Worried About Running Out of Food in the Last Year: Never true    Ran Out of Food in the Last Year: Never true  Transportation Needs: No Transportation Needs (05/04/2023)   PRAPARE - Administrator, Civil Service (Medical): No    Lack of Transportation (Non-Medical): No  Physical Activity: Not on file  Stress: Not on file  Social Connections: Not on file   Past Surgical History:  Procedure Laterality Date   BACK SURGERY     BRONCHIAL BIOPSY  04/02/2023   Procedure: BRONCHIAL BIOPSIES;  Surgeon: Brenna Adine CROME, DO;  Location: [facility];  Service: Pulmonary;;   BRONCHIAL BRUSHINGS  04/02/2023   Procedure: BRONCHIAL BRUSHINGS;  Surgeon: Brenna Adine CROME, DO;  Location: [facility];  Service: Pulmonary;;   BRONCHIAL NEEDLE ASPIRATION BIOPSY  04/02/2023   Procedure: BRONCHIAL NEEDLE ASPIRATION BIOPSIES;  Surgeon: Brenna Adine CROME, DO;  Location: [facility];  Service: Pulmonary;;   FIDUCIAL MARKER PLACEMENT  04/02/2023   Procedure: FIDUCIAL MARKER PLACEMENT;  Surgeon: Brenna Adine CROME, DO;  Location: [facility];  Service: Pulmonary;;  INTERCOSTAL NERVE BLOCK Left 05/03/2023   Procedure: INTERCOSTAL NERVE BLOCK;  Surgeon: Kerrin Elspeth BROCKS, MD;  [facility];  Service: Thoracic;  Laterality: Left;   LEFT HEART CATH AND CORONARY ANGIOGRAPHY N/A 04/30/2019   Procedure: LEFT HEART CATH AND CORONARY ANGIOGRAPHY;  Surgeon: Court Dorn PARAS, MD;  Location: [facility];  Service: Cardiovascular;  Laterality: N/A;   NODE DISSECTION Left 05/03/2023   Procedure: NODE DISSECTION;  Surgeon: Kerrin Elspeth BROCKS, MD;  Location: [facility] OR;  Service: Thoracic;  Laterality: Left;   TONSILLECTOMY     TUBAL LIGATION     Past Medical History:  Diagnosis Date   Adenocarcinoma of upper lobe of left lung (HCC)    Carpal tunnel syndrome    COPD (chronic obstructive pulmonary disease) (HCC)    Coronary artery calcification seen on CT scan    Normal coronaries at  cardiac catheterization 2020   Depression    Essential hypertension    Hepatitis C    Hypokalemia    Mixed hyperlipidemia    Rheumatoid arthritis (HCC)    Rheumatoid arthritis (HCC)    Seasonal allergies    Type 2 diabetes mellitus (HCC)    Varicose veins of legs    BP 122/74   Pulse (!) 103   Ht 5' 6 (1.676 m)   Wt 188 lb (85.3 kg)   SpO2 94%   BMI 30.34 kg/m   Opioid Risk Score:   Fall Risk Score:  `1  Depression screen [facility] 2/9     05/21/2024    2:41 PM 05/14/2024   11:01 AM 06/03/2023    8:36 AM  Depression screen PHQ 2/9  Decreased Interest 0 1 0  Down, Depressed, Hopeless 0 0 0  PHQ - 2 Score 0 1 0  Altered sleeping 0 0   Tired, decreased energy 0 1   Change in appetite 0 0   Feeling bad or failure about yourself  0 0   Trouble concentrating 0 0   Moving slowly or fidgety/restless 0 0   Suicidal thoughts 0 0   PHQ-9 Score 0 2   Difficult doing work/chores  Not difficult at all      Review of Systems  Constitutional:  Positive for diaphoresis.  Musculoskeletal:        B/L shoulder, hand, hip and foot pain  Neurological:  Positive for numbness.  Psychiatric/Behavioral:  The patient is nervous/anxious.   All other systems reviewed and are negative.      Objective:   Physical Exam  Gen: no distress, normal appearing HEENT: oral mucosa pink and moist, NCAT Chest: normal effort, normal rate of breathing Abd: soft, non-distended Ext: no edema Psych: pleasant, normal affect Skin: intact Neuro: Alert and awake, follows commands, cranial nerves II through XII grossly intact, normal speech and language RUE: 5/5 Deltoid, 5/5 Biceps, 5/5 Triceps, 5/5 Wrist Ext, 4/5 Grip LUE: 5/5 Deltoid, 5/5 Biceps, 5/5 Triceps, 5/5 Wrist Ext, 4/5 Grip RLE: HF 5/5, KE 5/5, ADF 5/5, APF 5/5 LLE: HF 5/5, KE 5/5, ADF 5/5, APF 5/5 Sensory exam normal for light touch and pain in all 4 limbs. No limb ataxia or cerebellar signs. No abnormal tone appreciated.  Musculoskeletal:  L  shoulder: Pain with active range of motion, particularly abduction above the head.  Hands: Reports pain with movement, but not with palpation. Lower Back: Non-tender to palpation. Knees: Non-tender to palpation.  Prior exam: Musculoskeletal:  Fair range of motion lumbar spine in flexion extension without significant pain  reported No significant lumbar spine TTP Nodules noted on bilateral lateral feet, bilateral hallux valgus TTP left greater than right shoulder, pain with passive range of motion Left greater than right shoulder tenderness to palpation, pain with ROM Mild redness and tenderness noted over bilateral MCP joints TTP in bilateral hands and feet Gait: Stable, no assistive device used. No significant greater trochanter tenderness today Knees nontender with full ROM No significant hip pain today with hip ROM        Assessment & Plan:   Chronic pain secondary to rheumatoid arthritis, inadequately controlled. Pain is multifocal, affecting hands, feet, hips and shoulders. Previous trials of non-opioid analgesics, topical agents, and adjunctive medications have been unsuccessful. Opiate risk tool low  1) Rheumatoid arthritis with associated polyarthralgia 2) History of lung cancer  - Will hold off on TENS unit for this reason 3) Chronic pain syndrome  - Opiate risk tool low 4) seizure disorder history  Plan 1.  Pain Management: Due to the inefficacy of other medications and contraindication to tramadol  (seizure history)     -   Will prescribe a refill for hydrocodone  5 mg today, to be taken twice daily as needed.     - If pain flares after stopping rheumatology medications for surgery, the hydrocodone  dose may need to be  temporarily adjusted. This will be arranged when the patient calls for the next refill.     - Continue UDS and pill counts.  Continue PDMP monitoring.  Pain contract completed prior visit.     - Discussed bringing pill bottle with any medications even if  empty to all appointments  2.  Non-pharmacologic Management:     -   Continue use of ice for symptomatic relief.     -   Continue general stretching exercises to maintain joint mobility.     -   Advised to wear comfortable, appropriately  fitting shoes.  3.  Injections: Discussed that corticosteroid injections to shoulder/hip are an option for severe focal flares in the future.    Follow-up scheduled in approximately two months, in December.  09/29/24 called pt, she having increase in shoulder pain, I'm ok if she tries hydrocodone  TID for short term  "

## 2024-06-19 NOTE — Progress Notes (Deleted)
   Subjective:    Patient ID: Christina Jenkins, female    DOB: 1961/06/08, 63 y.o.   MRN: [id number]  HPI  Review of Systems     Objective:   Physical Exam        Assessment & Plan:

## 2024-06-22 ENCOUNTER — Other Ambulatory Visit: Payer: Self-pay | Admitting: Rheumatology

## 2024-06-22 DIAGNOSIS — M0579 Rheumatoid arthritis with rheumatoid factor of multiple sites without organ or systems involvement: Secondary | ICD-10-CM

## 2024-06-23 ENCOUNTER — Other Ambulatory Visit: Payer: Self-pay

## 2024-06-29 ENCOUNTER — Encounter: Payer: Self-pay | Admitting: Physician Assistant

## 2024-06-29 ENCOUNTER — Ambulatory Visit: Attending: Physician Assistant | Admitting: Physician Assistant

## 2024-06-29 VITALS — BP 122/78 | HR 85 | Temp 98.1°F | Resp 14 | Ht 66.0 in | Wt 191.4 lb

## 2024-06-29 DIAGNOSIS — M19071 Primary osteoarthritis, right ankle and foot: Secondary | ICD-10-CM | POA: Diagnosis not present

## 2024-06-29 DIAGNOSIS — Z87898 Personal history of other specified conditions: Secondary | ICD-10-CM | POA: Diagnosis not present

## 2024-06-29 DIAGNOSIS — R7689 Other specified abnormal immunological findings in serum: Secondary | ICD-10-CM | POA: Diagnosis not present

## 2024-06-29 DIAGNOSIS — M25512 Pain in left shoulder: Secondary | ICD-10-CM | POA: Diagnosis not present

## 2024-06-29 DIAGNOSIS — M7061 Trochanteric bursitis, right hip: Secondary | ICD-10-CM | POA: Diagnosis not present

## 2024-06-29 DIAGNOSIS — M25511 Pain in right shoulder: Secondary | ICD-10-CM | POA: Diagnosis not present

## 2024-06-29 DIAGNOSIS — G8929 Other chronic pain: Secondary | ICD-10-CM

## 2024-06-29 DIAGNOSIS — Z7952 Long term (current) use of systemic steroids: Secondary | ICD-10-CM

## 2024-06-29 DIAGNOSIS — Z8709 Personal history of other diseases of the respiratory system: Secondary | ICD-10-CM | POA: Diagnosis not present

## 2024-06-29 DIAGNOSIS — M0630 Rheumatoid nodule, unspecified site: Secondary | ICD-10-CM

## 2024-06-29 DIAGNOSIS — Z79899 Other long term (current) drug therapy: Secondary | ICD-10-CM | POA: Diagnosis not present

## 2024-06-29 DIAGNOSIS — Z8619 Personal history of other infectious and parasitic diseases: Secondary | ICD-10-CM

## 2024-06-29 DIAGNOSIS — M19072 Primary osteoarthritis, left ankle and foot: Secondary | ICD-10-CM

## 2024-06-29 DIAGNOSIS — M0579 Rheumatoid arthritis with rheumatoid factor of multiple sites without organ or systems involvement: Secondary | ICD-10-CM

## 2024-06-29 DIAGNOSIS — M19041 Primary osteoarthritis, right hand: Secondary | ICD-10-CM | POA: Diagnosis not present

## 2024-06-29 DIAGNOSIS — Z87891 Personal history of nicotine dependence: Secondary | ICD-10-CM

## 2024-06-29 DIAGNOSIS — M19042 Primary osteoarthritis, left hand: Secondary | ICD-10-CM

## 2024-06-29 DIAGNOSIS — M7062 Trochanteric bursitis, left hip: Secondary | ICD-10-CM

## 2024-06-29 MED ORDER — PREDNISONE 5 MG PO TABS: 5.0000 mg | ORAL_TABLET | Freq: Every day | ORAL | 2 refills | Status: AC

## 2024-06-29 MED ORDER — LIDOCAINE HCL 1 % IJ SOLN
1.0000 mL | INTRAMUSCULAR | Status: AC | PRN
  Administered 2024-06-29: 1 mL

## 2024-06-29 MED ORDER — TRIAMCINOLONE ACETONIDE 40 MG/ML IJ SUSP
40.0000 mg | INTRAMUSCULAR | Status: AC | PRN
  Administered 2024-06-29: 40 mg via INTRA_ARTICULAR

## 2024-07-14 ENCOUNTER — Other Ambulatory Visit: Payer: Self-pay

## 2024-07-14 ENCOUNTER — Other Ambulatory Visit: Payer: Self-pay | Admitting: Physician Assistant

## 2024-07-14 DIAGNOSIS — M0579 Rheumatoid arthritis with rheumatoid factor of multiple sites without organ or systems involvement: Secondary | ICD-10-CM

## 2024-07-14 MED ORDER — KEVZARA 200 MG/1.14ML ~~LOC~~ SOAJ
200.0000 mg | SUBCUTANEOUS | 0 refills | Status: DC
  Filled 2024-07-14 – 2024-07-16 (×2): qty 2.28, 28d supply, fill #0

## 2024-07-14 NOTE — Telephone Encounter (Signed)
 Last Fill: 03/26/2024  Labs: 05/08/2024 Glucose 111  TB Gold: Negative 01/21/2023    Next Visit: 11/27/2024  Last Visit: 06/29/2024  IK:Myzlfjunpi arthritis with rheumatoid factor of multiple sites without organ or systems involvement   Current Dose per office note 06/29/2024: Kevzara  200 mg sq injections once every 14 days   Left message to advise patient she is due to update her TB Gold.   Okay to refill Kevzara ?

## 2024-07-16 ENCOUNTER — Telehealth: Payer: Self-pay | Admitting: Physical Medicine & Rehabilitation

## 2024-07-16 ENCOUNTER — Other Ambulatory Visit: Payer: Self-pay

## 2024-07-16 ENCOUNTER — Other Ambulatory Visit: Payer: Self-pay | Admitting: Pharmacy Technician

## 2024-07-16 MED ORDER — HYDROCODONE-ACETAMINOPHEN 5-325 MG PO TABS: 1.0000 | ORAL_TABLET | Freq: Two times a day (BID) | ORAL | 0 refills | Status: DC | PRN

## 2024-07-16 NOTE — Telephone Encounter (Signed)
 Pt need a med refill on Hydrocodone  5

## 2024-07-16 NOTE — Progress Notes (Signed)
 Specialty Pharmacy Refill Coordination Note  Christina Jenkins is a 63 y.o. female contacted today regarding refills of specialty medication(s) Sarilumab  (Kevzara )   Patient requested Delivery   Delivery date: 07/22/24   Verified address: [address]  [place]   Medication will be filled on: 07/21/24

## 2024-07-16 NOTE — Telephone Encounter (Signed)
 Pt calling to request a refill on HYDROcodone -acetaminophen  (NORCO/VICODIN) 5-325 MG tablet. She says she is set to run out on Sat 11/15. She would like to inform Dr. Urbano that she does not need to adjust the dosage as discussed because she is not having cataract surgery.

## 2024-07-20 ENCOUNTER — Other Ambulatory Visit: Payer: Self-pay

## 2024-08-12 ENCOUNTER — Telehealth: Payer: Self-pay | Admitting: *Deleted

## 2024-08-12 MED ORDER — HYDROCODONE-ACETAMINOPHEN 5-325 MG PO TABS: 1.0000 | ORAL_TABLET | Freq: Two times a day (BID) | ORAL | 0 refills | Status: DC | PRN

## 2024-08-12 NOTE — Telephone Encounter (Signed)
 Christina Jenkins called and is requesting a refill on her hydrocodone .

## 2024-08-17 ENCOUNTER — Other Ambulatory Visit: Payer: Self-pay | Admitting: Physician Assistant

## 2024-08-17 ENCOUNTER — Other Ambulatory Visit: Payer: Self-pay

## 2024-08-17 DIAGNOSIS — M0579 Rheumatoid arthritis with rheumatoid factor of multiple sites without organ or systems involvement: Secondary | ICD-10-CM

## 2024-08-17 MED ORDER — KEVZARA 200 MG/1.14ML ~~LOC~~ SOAJ
200.0000 mg | SUBCUTANEOUS | 2 refills | Status: AC
  Filled 2024-08-17: qty 2.28, 28d supply, fill #0
  Filled 2024-09-25 – 2024-10-09 (×4): qty 2.28, 28d supply, fill #1

## 2024-08-17 NOTE — Telephone Encounter (Signed)
 Last Fill: 07/14/2024  Labs: 05/08/2024 Glucose 111  Chest CT 05/08/24.   Next Visit: 11/27/2024  Last Visit: 06/29/2024  IK:Myzlfjunpi arthritis with rheumatoid factor of multiple sites without organ or systems involvement   Current Dose per office note 06/29/2024: Kevzara  200 mg sq injections every 14 days   Okay to refill Kevzara ?

## 2024-08-18 ENCOUNTER — Encounter: Admitting: Physical Medicine & Rehabilitation

## 2024-08-19 ENCOUNTER — Other Ambulatory Visit: Payer: Self-pay

## 2024-08-19 ENCOUNTER — Other Ambulatory Visit: Payer: Self-pay | Admitting: Pharmacy Technician

## 2024-08-19 NOTE — Progress Notes (Signed)
 Specialty Pharmacy Refill Coordination Note  Christina Jenkins is a 63 y.o. female contacted today regarding refills of specialty medication(s) Sarilumab  (Kevzara )   Patient requested Delivery   Delivery date: 09/01/24   Verified address: [address]  [place] [address]   Medication will be filled on: 08/31/24

## 2024-08-20 ENCOUNTER — Other Ambulatory Visit: Payer: Self-pay

## 2024-08-20 ENCOUNTER — Other Ambulatory Visit: Payer: Self-pay | Admitting: Physician Assistant

## 2024-08-20 DIAGNOSIS — Z79899 Other long term (current) drug therapy: Secondary | ICD-10-CM

## 2024-08-20 DIAGNOSIS — M0579 Rheumatoid arthritis with rheumatoid factor of multiple sites without organ or systems involvement: Secondary | ICD-10-CM

## 2024-08-20 DIAGNOSIS — Z111 Encounter for screening for respiratory tuberculosis: Secondary | ICD-10-CM

## 2024-08-20 NOTE — Progress Notes (Unsigned)
 Patient getting labs done at [facility].

## 2024-08-20 NOTE — Telephone Encounter (Signed)
 Last Fill: 05/22/2024  Labs: 05/08/2024  Glucose 111  Next Visit: 11/27/2024  Last Visit: 06/29/2024  DX: Rheumatoid arthritis with rheumatoid factor of multiple sites without organ or systems involvement (HCC)   Current Dose per office note 06/29/2024: Arava  20 mg 1 tablet by mouth daily   Contacted the patient and advised she is due to update labs. Patient requested her lab orders be sent to [facility]. Will pend orders and fax orders over.   Okay to refill Arava  ?

## 2024-08-21 LAB — LAB REPORT - SCANNED: EGFR: 72

## 2024-08-25 ENCOUNTER — Telehealth: Payer: Self-pay

## 2024-08-25 NOTE — Telephone Encounter (Signed)
 Labs received from:[facility]  Drawn on:08/25/2024  Reviewed by: Maya Nash   Labs drawn:CBC w Diff, CMETAB+Ferritin, Lipid Panel, Quantiferon Gold, RF, Uric Acid  Results: Glucose 113 Cholesterol 254 LDL 170 TB Gold Negative  Patient on Arava  20 MG daily, Kevzara  200 MG SQ every 14 Days, Prednisone  5MG  Daily

## 2024-08-31 ENCOUNTER — Other Ambulatory Visit: Payer: Self-pay

## 2024-09-09 ENCOUNTER — Other Ambulatory Visit: Payer: Self-pay

## 2024-09-09 MED ORDER — HYDROCODONE-ACETAMINOPHEN 5-325 MG PO TABS: 1.0000 | ORAL_TABLET | Freq: Two times a day (BID) | ORAL | 0 refills | Status: DC | PRN

## 2024-09-09 NOTE — Telephone Encounter (Signed)
 Patient called and said she will be out of medication on Saturday. She had an appt for 12/17 but had to be rescheduled because you were on vacation. She was rescheduled to 1/30.

## 2024-09-10 ENCOUNTER — Encounter: Admitting: Physical Medicine & Rehabilitation

## 2024-09-21 ENCOUNTER — Other Ambulatory Visit (HOSPITAL_COMMUNITY): Payer: Self-pay

## 2024-09-23 ENCOUNTER — Telehealth: Payer: Self-pay | Admitting: *Deleted

## 2024-09-23 ENCOUNTER — Telehealth: Payer: Self-pay

## 2024-09-23 NOTE — Telephone Encounter (Signed)
 Patient has been informed Dr. Urbano is at the hospital today. He should be in the office tomorrow.   Christina Jenkins wanted to know if she could take Hydrocodone  5-325 three times a day? Her left shoulder is hurting. And its too soon for the steroid injection.    Call back phone [phone].

## 2024-09-23 NOTE — Telephone Encounter (Signed)
 Ok to proceed with repeat injection on or after 09/29/24.

## 2024-09-23 NOTE — Telephone Encounter (Signed)
 Patient advised ok to proceed with repeat injection on or after 09/29/24.

## 2024-09-23 NOTE — Telephone Encounter (Signed)
 Patient contacted the office stating she has been having a flare in her left shoulder for 4 days.Patient states she has tried Tylenol , ibuprofen, ice, heat and nothing nothing is helping.  Patient states she can not afford to come to [place]. Patient would like to know if she can she go to PCP for a cortisone injection? Patient states the PCP wanted her to call us  to find out. Her last cortisone injection was 06/29/2024. Please advise.

## 2024-09-25 ENCOUNTER — Other Ambulatory Visit: Payer: Self-pay

## 2024-09-26 NOTE — Telephone Encounter (Signed)
 Patient called back to see if she can take the extra tablet. Call back phone  [phone].

## 2024-09-28 ENCOUNTER — Other Ambulatory Visit: Payer: Self-pay

## 2024-09-29 ENCOUNTER — Other Ambulatory Visit: Payer: Self-pay

## 2024-09-30 ENCOUNTER — Other Ambulatory Visit: Payer: Self-pay

## 2024-09-30 ENCOUNTER — Telehealth: Payer: Self-pay | Admitting: Pharmacist

## 2024-09-30 NOTE — Progress Notes (Signed)
 Received message from specialty pharmacy that patient's copay for Kevzara  has increased. Lorrene has expired. No grant is currently open  Last year's Kevzara  PAP was in limbo because of needing to apply for and be denied LIS. Patient had had difficulty completing this process  ATC patient today to discuss. Unabe to reach. Left VM requesting return call - consider re-applying for PAP but patient will like need that LIS denial  Sherry Pennant, PharmD, MPH, BCPS, CPP Clinical Pharmacist

## 2024-09-30 NOTE — Telephone Encounter (Signed)
 Received message from specialty pharmacy that patient's copay for Kevzara  has increased. Lorrene has expired. No grant is currently open  Last year's Kevzara  PAP was in limbo because of needing to apply for and be denied LIS. Patient had had difficulty completing this process  ATC patient today to discuss. Unabe to reach. Left VM requesting return call - consider re-applying for PAP but patient will like need that LIS denial  Sherry Pennant, PharmD, MPH, BCPS, CPP Clinical Pharmacist

## 2024-10-02 ENCOUNTER — Other Ambulatory Visit: Payer: Self-pay

## 2024-10-02 ENCOUNTER — Encounter: Admitting: Physical Medicine & Rehabilitation

## 2024-10-05 ENCOUNTER — Other Ambulatory Visit: Payer: Self-pay

## 2024-10-06 ENCOUNTER — Telehealth: Payer: Self-pay | Admitting: *Deleted

## 2024-10-06 ENCOUNTER — Other Ambulatory Visit: Payer: Self-pay

## 2024-10-06 MED ORDER — HYDROCODONE-ACETAMINOPHEN 5-325 MG PO TABS: 1.0000 | ORAL_TABLET | Freq: Three times a day (TID) | ORAL | 0 refills | Status: AC | PRN

## 2024-10-06 NOTE — Telephone Encounter (Signed)
 Requesting a refill on hydrocodone . Has called x2. She was told she could increase amt taken so she is out.

## 2024-10-07 ENCOUNTER — Other Ambulatory Visit (HOSPITAL_COMMUNITY): Payer: Self-pay

## 2024-10-09 ENCOUNTER — Other Ambulatory Visit: Payer: Self-pay

## 2024-10-20 ENCOUNTER — Encounter: Admitting: Physical Medicine & Rehabilitation

## 2024-11-12 ENCOUNTER — Other Ambulatory Visit (HOSPITAL_COMMUNITY)

## 2024-11-12 ENCOUNTER — Inpatient Hospital Stay

## 2024-11-18 ENCOUNTER — Ambulatory Visit: Admitting: Oncology

## 2024-11-19 ENCOUNTER — Ambulatory Visit: Admitting: Oncology

## 2024-11-27 ENCOUNTER — Ambulatory Visit: Admitting: Physician Assistant
